# Patient Record
Sex: Male | Born: 1955 | Race: White | Hispanic: No | Marital: Single | Smoking: Former smoker
Health system: Southern US, Community
[De-identification: ages and names within clinical notes are randomized; demographics above are authoritative.]

## PROBLEM LIST (undated history)

## (undated) DIAGNOSIS — F329 Major depressive disorder, single episode, unspecified: Secondary | ICD-10-CM

## (undated) DIAGNOSIS — F32A Depression, unspecified: Secondary | ICD-10-CM

## (undated) DIAGNOSIS — F341 Dysthymic disorder: Secondary | ICD-10-CM

## (undated) DIAGNOSIS — Q8740 Marfan's syndrome, unspecified: Secondary | ICD-10-CM

## (undated) DIAGNOSIS — Q72819 Congenital shortening of unspecified lower limb: Secondary | ICD-10-CM

## (undated) DIAGNOSIS — M5416 Radiculopathy, lumbar region: Secondary | ICD-10-CM

## (undated) DIAGNOSIS — F419 Anxiety disorder, unspecified: Secondary | ICD-10-CM

## (undated) HISTORY — DX: Marfan syndrome, unspecified: Q87.40

## (undated) HISTORY — DX: Congenital shortening of unspecified lower limb: Q72.819

## (undated) HISTORY — DX: Anxiety disorder, unspecified: F41.9

## (undated) HISTORY — DX: Depression, unspecified: F32.A

## (undated) HISTORY — DX: Major depressive disorder, single episode, unspecified: F32.9

## (undated) HISTORY — DX: Radiculopathy, lumbar region: M54.16

## (undated) HISTORY — PX: LEG SURGERY: SHX1003

## (undated) HISTORY — DX: Dysthymic disorder: F34.1

---

## 1998-02-15 ENCOUNTER — Emergency Department (HOSPITAL_COMMUNITY): Admission: EM | Admit: 1998-02-15 | Discharge: 1998-02-15 | Payer: Self-pay | Admitting: Emergency Medicine

## 2006-04-24 DIAGNOSIS — M5416 Radiculopathy, lumbar region: Secondary | ICD-10-CM

## 2006-04-24 HISTORY — DX: Radiculopathy, lumbar region: M54.16

## 2012-06-24 DIAGNOSIS — E785 Hyperlipidemia, unspecified: Secondary | ICD-10-CM | POA: Insufficient documentation

## 2012-07-04 ENCOUNTER — Ambulatory Visit (INDEPENDENT_AMBULATORY_CARE_PROVIDER_SITE_OTHER): Payer: Medicare Other | Admitting: Family Medicine

## 2012-07-04 VITALS — BP 142/90 | HR 117 | Temp 98.3°F | Resp 18 | Ht 74.5 in | Wt 211.4 lb

## 2012-07-04 DIAGNOSIS — R000 Tachycardia, unspecified: Secondary | ICD-10-CM | POA: Diagnosis not present

## 2012-07-04 DIAGNOSIS — F17200 Nicotine dependence, unspecified, uncomplicated: Secondary | ICD-10-CM | POA: Diagnosis not present

## 2012-07-04 DIAGNOSIS — R339 Retention of urine, unspecified: Secondary | ICD-10-CM

## 2012-07-04 DIAGNOSIS — E663 Overweight: Secondary | ICD-10-CM

## 2012-07-04 DIAGNOSIS — F339 Major depressive disorder, recurrent, unspecified: Secondary | ICD-10-CM | POA: Insufficient documentation

## 2012-07-04 DIAGNOSIS — R030 Elevated blood-pressure reading, without diagnosis of hypertension: Secondary | ICD-10-CM

## 2012-07-04 DIAGNOSIS — F411 Generalized anxiety disorder: Secondary | ICD-10-CM

## 2012-07-04 DIAGNOSIS — F329 Major depressive disorder, single episode, unspecified: Secondary | ICD-10-CM

## 2012-07-04 LAB — POCT CBC
HCT, POC: 55.8 % — AB (ref 43.5–53.7)
Hemoglobin: 18.4 g/dL — AB (ref 14.1–18.1)
Lymph, poc: 2.4 (ref 0.6–3.4)
MCH, POC: 31.7 pg — AB (ref 27–31.2)
MCHC: 33 g/dL (ref 31.8–35.4)
MCV: 96.1 fL (ref 80–97)
MPV: 8.8 fL (ref 0–99.8)
POC MID %: 7.7 %M (ref 0–12)
WBC: 7.5 10*3/uL (ref 4.6–10.2)

## 2012-07-04 LAB — COMPREHENSIVE METABOLIC PANEL
Albumin: 5.1 g/dL (ref 3.5–5.2)
BUN: 23 mg/dL (ref 6–23)
CO2: 27 mEq/L (ref 19–32)
Calcium: 10 mg/dL (ref 8.4–10.5)
Chloride: 102 mEq/L (ref 96–112)
Creat: 0.95 mg/dL (ref 0.50–1.35)
Glucose, Bld: 101 mg/dL — ABNORMAL HIGH (ref 70–99)
Potassium: 4.6 mEq/L (ref 3.5–5.3)

## 2012-07-04 LAB — TSH: TSH: 2.407 u[IU]/mL (ref 0.350–4.500)

## 2012-07-04 LAB — LIPID PANEL
LDL Cholesterol: 164 mg/dL — ABNORMAL HIGH (ref 0–99)
Total CHOL/HDL Ratio: 4.7 Ratio

## 2012-07-04 MED ORDER — CLOMIPRAMINE HCL 50 MG PO CAPS: 100.0000 mg | ORAL_CAPSULE | Freq: Every day | ORAL | Status: DC

## 2012-07-04 NOTE — Patient Instructions (Signed)
Check your pulse - it should be less than 50 beats in 30 seconds (or less than 25 beats in 15 seconds.) If it is higher, try deep breathing, relaxation, meditation, and drink a lot of water.  Drinking to much caffiene or alcohol the night before can also elevated your pulse and blood pressure.

## 2012-07-04 NOTE — Progress Notes (Signed)
  Subjective:    Patient ID: Christopher Dawson, male    DOB: 03-21-1956, 57 y.o.   MRN: [id number] Chief Complaint  Patient presents with  . Medication Refill   HPI  Had been being seen at [facility] but had to cancel appts when he didn't have insurance as couldn't afford it and then he couldn't get back in.  Wants to establish elsewhere.  Sees Dr. 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  Depression and anxiety.  Just in the last several days, he got into the pt assistance program for anafranil.  Has been on for 22 yrs and has been excellent for him. Had been on ativan 1.5mg  qd for 30 yrs for anxiety but was able to get off of it and has been doing better.  Was prev seen at [facility] but since has been coming here.  Has been drinking a lot of beer.    Has not had labs done in a long time.  Past Medical History  Diagnosis Date  . Depression   . Anxiety   . Lumbar radiculopathy, chronic 2008   No current outpatient prescriptions on file prior to visit.   No current facility-administered medications on file prior to visit.   Allergies  Allergen Reactions  . Codeine      Review of Systems    BP 142/90  Pulse 117  Temp(Src) 98.3 F (36.8 C) (Oral)  Resp 18  Ht 6' 2.5" (1.892 m)  Wt 211 lb 6.4 oz (95.89 kg)  BMI 26.79 kg/m2  SpO2 97% Objective:   Physical Exam        Assessment & Plan:  Tobacco use disorder - Plan: PSA  Anxiety state, unspecified - Plan: Ambulatory referral to Psychiatry  Depression - Plan: Ambulatory referral to Psychiatry  Elevated blood pressure - Plan: Lipid panel, PSA, Comprehensive metabolic panel  Tachycardia - Plan: POCT CBC, PSA, TSH  Overweight  - Plan: Lipid panel  Incomplete bladder emptying  - Plan: PSA  Meds ordered this encounter  Medications  . DISCONTD: ClomiPRAMINE HCl (ANAFRANIL PO)    Sig: Take 100 mg by mouth.  . DISCONTD:  clomiPRAMINE (ANAFRANIL) 50 MG capsule    Sig: Take 2 capsules (100 mg total) by mouth at bedtime.    Dispense:  30 capsule    Refill:  2

## 2012-08-22 ENCOUNTER — Other Ambulatory Visit: Payer: Self-pay | Admitting: Family Medicine

## 2012-08-23 ENCOUNTER — Telehealth: Payer: Self-pay

## 2012-08-23 NOTE — Telephone Encounter (Signed)
PT WOULD LIKE TO KNOW WHY HIS CLOMIPRAMINE WAS DENIED FOR A REFILL. ALSO WANTS A REFERRAL TO A DIFFERENT PSYCHOLOGIST OTHER THAN [facility]. [facility]# [phone]

## 2012-08-23 NOTE — Telephone Encounter (Signed)
Because he should have a refill left. Called him, he does not have refill left, he takes 2 at bedtime. Pended with qs for 1 month, please advise, also he is advised he does not need referral for psychology. He is encouraged to pick someone and self schedule this appt.

## 2012-08-23 NOTE — Telephone Encounter (Signed)
Perfect, thank you

## 2012-08-24 NOTE — Telephone Encounter (Signed)
Pt states that he still has questions regarding his rx refill on anafranil, pt states that it has not been called in to his pharmacy yet. [facility]# [phone]

## 2012-08-26 ENCOUNTER — Other Ambulatory Visit: Payer: Self-pay | Admitting: Radiology

## 2012-08-26 MED ORDER — CLOMIPRAMINE HCL 50 MG PO CAPS: 100.0000 mg | ORAL_CAPSULE | Freq: Every day | ORAL | Status: DC

## 2012-08-26 NOTE — Telephone Encounter (Signed)
Patient called again, his Rx was not rec'd at the pharmacy it is resent.

## 2012-12-01 ENCOUNTER — Other Ambulatory Visit: Payer: Self-pay | Admitting: Family Medicine

## 2013-02-08 ENCOUNTER — Other Ambulatory Visit: Payer: Self-pay | Admitting: Family Medicine

## 2013-03-06 ENCOUNTER — Encounter: Payer: Self-pay | Admitting: Family Medicine

## 2013-03-13 ENCOUNTER — Other Ambulatory Visit: Payer: Self-pay | Admitting: Family Medicine

## 2013-03-17 ENCOUNTER — Other Ambulatory Visit: Payer: Self-pay | Admitting: Family Medicine

## 2013-03-18 NOTE — Telephone Encounter (Signed)
Needs OV, 3rd notice

## 2013-04-01 ENCOUNTER — Telehealth: Payer: Self-pay

## 2013-04-01 MED ORDER — CLOMIPRAMINE HCL 50 MG PO CAPS: 100.0000 mg | ORAL_CAPSULE | Freq: Every day | ORAL | Status: DC

## 2013-04-01 NOTE — Telephone Encounter (Signed)
SHAW - PT NEEDS A REFILL ON HIS ANAFRANIL.  HE HAS SCHEDULED AN APPOINTMENT WITH YOU FOR DEC. 19, BUT WILL BE OUT OF THE MEDICINE BEFORE THEN.  HIS CELL PHONE IS NOT WORKING AND HE HAS BEEN USING PAY PHONES.  SAYS HE WILL HAVE PHARMACY SEND REQUEST FOR THE REFILL ALSO AND THAT HE WILL CHECK BACK WITH [place] OR THEM IN THE NEXT DAY OR TWO TO SEE IF WE REFILLED SCRIPT.

## 2013-04-01 NOTE — Telephone Encounter (Signed)
Refilled since pt has appt sched on 12/19

## 2013-04-11 ENCOUNTER — Ambulatory Visit (INDEPENDENT_AMBULATORY_CARE_PROVIDER_SITE_OTHER): Payer: Medicare Other | Admitting: Family Medicine

## 2013-04-11 ENCOUNTER — Encounter: Payer: Self-pay | Admitting: Family Medicine

## 2013-04-11 VITALS — BP 150/90 | HR 117 | Temp 98.1°F | Resp 18 | Ht 74.75 in | Wt 206.2 lb

## 2013-04-11 DIAGNOSIS — I10 Essential (primary) hypertension: Secondary | ICD-10-CM

## 2013-04-11 DIAGNOSIS — F411 Generalized anxiety disorder: Secondary | ICD-10-CM

## 2013-04-11 DIAGNOSIS — D751 Secondary polycythemia: Secondary | ICD-10-CM

## 2013-04-11 DIAGNOSIS — F17200 Nicotine dependence, unspecified, uncomplicated: Secondary | ICD-10-CM

## 2013-04-11 DIAGNOSIS — F341 Dysthymic disorder: Secondary | ICD-10-CM

## 2013-04-11 DIAGNOSIS — F329 Major depressive disorder, single episode, unspecified: Secondary | ICD-10-CM

## 2013-04-11 DIAGNOSIS — R269 Unspecified abnormalities of gait and mobility: Secondary | ICD-10-CM

## 2013-04-11 DIAGNOSIS — E785 Hyperlipidemia, unspecified: Secondary | ICD-10-CM

## 2013-04-11 DIAGNOSIS — Q8740 Marfan's syndrome, unspecified: Secondary | ICD-10-CM

## 2013-04-11 MED ORDER — CLOMIPRAMINE HCL 50 MG PO CAPS: 100.0000 mg | ORAL_CAPSULE | Freq: Every day | ORAL | Status: DC

## 2013-04-11 NOTE — Progress Notes (Signed)
Subjective:    Patient ID: Christopher Dawson, male    DOB: [birth year], 57 y.o.   MRN: [id number] Chief Complaint  Patient presents with  . Medication Refill  . Anxiety    would like to go back on ativan   HPI  Was on ativan 1.5mg  qd for 32 yrs and went off of it about a yr ago. At the last visit I wrote that he reported feeling better off of it but he is very sure that this was NOT correct. 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  States the depressive neurosis presents sometimes as "numbness" which the ativan helps with - when asked if this was physical or mental numbness he replied "physical."  He was prev seen at [facility] by Dr. Edmund Hilda who retired then next psychiatrist there also retired so then was seen by NP Tamela Oddi at [facility] once but something happened - he thinks his visit was billed wrong - so he still owes them $$ so can't (and doesn't want to) go back.  Would be open to establishing elsewhere - just wasn't sure where to look. Still on pt assistance program for the anafranil - Has been on for 22 yrs and has been excellent for him.   Was shocked that his last LDL was elevated. Eats no cholesterol. No margarine or butter. No oil of any type other than olive oil. Usually eats raw spinach, baby lettuce, cottage cheese for breakfast.  Does eat very lean hamburger about twice a week, generally chicken, no cheese, no eggs.  No desserts. Does all of his own cooking. No sig fam hx of heart disease or HPL that he is aware of so doesn't know how he could poss have an elev chol.  Has straight medicare, no supplemental, so got a bill of > $100 after his last visit w/ me for visit copay and labs.  On disability so still hasn't paid it off completely so really needs to defer any labs or extra things today - just wants his psych meds refiled.  Doesn't have a car right now so has to take a taxi everywhere - very  expensive  On disability for lumbar disc prob in back since 2008 and one congenitally shortened leg as well as Marfan's syndrome.  Past Medical History  Diagnosis Date  . Depression   . Anxiety   . Lumbar radiculopathy, chronic 2008   Current Outpatient Prescriptions on File Prior to Visit  Medication Sig Dispense Refill  . clomiPRAMINE (ANAFRANIL) 50 MG capsule Take 2 capsules (100 mg total) by mouth at bedtime. PATIENT NEEDS OFFICE VISIT FOR ADDITIONAL REFILLS - 2nd NOTICE  30 capsule  0  . clomiPRAMINE (ANAFRANIL) 50 MG capsule Take 2 capsules (100 mg total) by mouth at bedtime. NEED VISIT!!4th notice  30 capsule  0   No current facility-administered medications on file prior to visit.   Allergies  Allergen Reactions  . Codeine      Review of Systems  Constitutional: Negative for diaphoresis, activity change, appetite change and unexpected weight change.  Respiratory: Negative for chest tightness and shortness of breath.   Cardiovascular: Negative for chest pain and palpitations.  Gastrointestinal: Negative for nausea and vomiting.  Musculoskeletal: Positive for arthralgias and gait problem.  Neurological: Positive for numbness. Negative for dizziness, syncope and light-headedness.  Psychiatric/Behavioral: Positive for dysphoric mood. Negative for suicidal ideas, behavioral problems, confusion, self-injury and agitation.  The patient is nervous/anxious. The patient is not hyperactive.       BP 170/98  Pulse 117  Temp(Src) 98.1 F (36.7 C) (Oral)  Resp 18  Ht 6' 2.75" (1.899 m)  Wt 206 lb 3.2 oz (93.532 kg)  BMI 25.94 kg/m2  SpO2 98% Objective:   Physical Exam  Constitutional: He is oriented to person, place, and time. He appears well-developed and well-nourished. No distress.  HENT:  Head: Normocephalic and atraumatic.  Eyes: Conjunctivae are normal. No scleral icterus.  Neck: Normal range of motion. Neck supple. No thyromegaly present.  Cardiovascular: Normal rate,  regular rhythm, normal heart sounds and intact distal pulses.   Pulmonary/Chest: Effort normal and breath sounds normal. No respiratory distress.  Lymphadenopathy:    He has no cervical adenopathy.  Neurological: He is alert and oriented to person, place, and time. Gait abnormal.  Skin: Skin is warm and dry. He is not diaphoretic.  Psychiatric: He has a normal mood and affect. His speech is normal and behavior is normal. He exhibits normal recent memory and normal remote memory.      Assessment & Plan:  Tobacco use disorder - states he doesn't inhale and planning to switch to e-cig/vapor. Contemplative about quitting.  Depression - Pt's mood d/o has been stable on anafranil for many yrs so refilled but rec cmp and ekg occ for monitoring. Declined today due to cost but will do at f/u.  Anxiety state, unspecified - I am unwilling to rx controlled med to pt as he cannot afford to f/u every 3 months and pt has a lot of risk factors for non-compliance esp w/ OVs. Also pt revealed at last visit that he is consuming " a lot" of EtOH so unwilling to add on another potential cns depressent. Pt reminded that due to his complex mental health hx if he wants to change his meds at all from the below anafranil or restart controlled substance he will have to see a psychiatrist for this - pt very agreeable and understanding. Contact info for sev excellent providers given.  Essential hypertension, benign - improved some on recheck. Suspect white coat HTN due to mood sxs so will try to check outside the office.  If pt does have Marfan, recommended to be on BB (may benefit from propranolol due to anxiety hx?) and cons losartan as well to reduce rate of aortic root dilatation.  Need prior records - have pt sign release at f/u.  Other and unspecified hyperlipidemia - Pt very concerned due to highly rigorous low chol diet - discussed likely genetic component but need to lower due to mult other risk factors of age, white,  male, smoking, HTN. Declines med now due to lab work connected but would like to recheck flp at f/u and cons med if needed then.  If pt does have Marfan, should likely be on statin anyway to help prevent remodling.  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 Rec recheck at f/u.  HM - pt recommended to make appt for medicare wellness physical at next OV in 6 mos - can get EKG and labs at that time poss at less cost to pt.  ? H/o marfan syndrome - Pt has apparently transferred his care here - needs to sign release of records at f/u so can get hx from [facility] to confirm diagnosis of Marfan as then pt would need annual cardiology and optho eval.  Meds ordered this encounter  Medications  . clomiPRAMINE (ANAFRANIL) 50 MG  capsule    Sig: Take 2 capsules (100 mg total) by mouth at bedtime.    Dispense:  180 capsule    Refill:  2    Norberto Sorenson, MD MPH

## 2013-04-11 NOTE — Patient Instructions (Addendum)
[facility]  [address], [place], [place] [address]  [phone]  [facility] - Dr. Allena Katz Address: [address], [place], [place] [address]  [phone]  [facility] [address] [place], [id number] Phone: [phone]  Hypercholesterolemia High Blood Cholesterol Cholesterol is a white, waxy, fat-like protein needed by your body in small amounts. The liver makes all the cholesterol you need. It is carried from the liver by the blood through the blood vessels. Deposits (plaque) may build up on blood vessel walls. This makes the arteries narrower and stiffer. Plaque increases the risk for heart attack and stroke. You cannot feel your cholesterol level even if it is very high. The only way to know is by a blood test to check your lipid (fats) levels. Once you know your cholesterol levels, you should keep a record of the test results. Work with your caregiver to to keep your levels in the desired range. WHAT THE RESULTS MEAN:  Total cholesterol is a rough measure of all the cholesterol in your blood.  LDL is the so-called bad cholesterol. This is the type that deposits cholesterol in the walls of the arteries. You want this level to be low.  HDL is the good cholesterol because it cleans the arteries and carries the LDL away. You want this level to be high.  Triglycerides are fat that the body can either burn for energy or store. High levels are closely linked to heart disease. DESIRED LEVELS:  Total cholesterol below 200.  LDL below 100 for people at risk, below 70 for very high risk.  HDL above 50 is good, above 60 is best.  Triglycerides below 150. HOW TO LOWER YOUR CHOLESTEROL:  Diet.  Choose fish or white meat chicken and [place], roasted or baked. Limit fatty cuts of red meat, fried foods, and processed meats, such as sausage and lunch meat.  Eat lots of fresh fruits and  vegetables. Choose whole grains, beans, pasta, potatoes and cereals.  Use only small amounts of olive, corn or canola oils. Avoid butter, mayonnaise, shortening or palm kernel oils. Avoid foods with trans-fats.  Use skim/nonfat milk and low-fat/nonfat yogurt and cheeses. Avoid whole milk, cream, ice cream, egg yolks and cheeses. Healthy desserts include angel food cake, gingersnaps, animal crackers, hard candy, popsicles, and low-fat/nonfat frozen yogurt. Avoid pastries, cakes, pies and cookies.  Exercise.  A regular program helps decrease LDL and raises HDL.  Helps with weight control.  Do things that increase your activity level like gardening, walking, or taking the stairs.  Medication.  May be prescribed by your caregiver to help lowering cholesterol and the risk for heart disease.  You may need medicine even if your levels are normal if you have several risk factors. HOME CARE INSTRUCTIONS   Follow your diet and exercise programs as suggested by your caregiver.  Take medications as directed.  Have blood work done when your caregiver feels it is necessary. MAKE SURE YOU:   Understand these instructions.  Will watch your condition.  Will get help right away if you are not doing well or get worse. Document Released: 04/10/2005 Document Revised: 07/03/2011 Document Reviewed: 09/26/2006 [facility] Patient Information 2014 [place], [place]. Managing Your High Blood Pressure Blood pressure is a measurement of how forceful your blood is pressing against the walls of the arteries. Arteries are muscular tubes within the circulatory system. Blood pressure does not stay the same. Blood pressure rises when you are active, excited, or  nervous; and it lowers during sleep and relaxation. If the numbers measuring your blood pressure stay above normal most of the time, you are at risk for health problems. High blood pressure (hypertension) is a long-term (chronic) condition in which blood  pressure is elevated. A blood pressure reading is recorded as two numbers, such as 120 over 80 (or 120/80). The first, higher number is called the systolic pressure. It is a measure of the pressure in your arteries as the heart beats. The second, lower number is called the diastolic pressure. It is a measure of the pressure in your arteries as the heart relaxes between beats.  Keeping your blood pressure in a normal range is important to your overall health and prevention of health problems, such as heart disease and stroke. When your blood pressure is uncontrolled, your heart has to work harder than normal. High blood pressure is a very common condition in adults because blood pressure tends to rise with age. Men and women are equally likely to have hypertension but at different times in life. Before age 107, men are more likely to have hypertension. After 58 years of age, women are more likely to have it. Hypertension is especially common in African Americans. This condition often has no signs or symptoms. The cause of the condition is usually not known. Your caregiver can help you come up with a plan to keep your blood pressure in a normal, healthy range. BLOOD PRESSURE STAGES Blood pressure is classified into four stages: normal, prehypertension, stage 1, and stage 2. Your blood pressure reading will be used to determine what type of treatment, if any, is necessary. Appropriate treatment options are tied to these four stages:  Normal  Systolic pressure (mm Hg): below 120.  Diastolic pressure (mm Hg): below 80. Prehypertension  Systolic pressure (mm Hg): 120 to 139.  Diastolic pressure (mm Hg): 80 to 89. Stage1  Systolic pressure (mm Hg): 140 to 159.  Diastolic pressure (mm Hg): 90 to 99. Stage2  Systolic pressure (mm Hg): 160 or above.  Diastolic pressure (mm Hg): 100 or above. RISKS RELATED TO HIGH BLOOD PRESSURE Managing your blood pressure is an important responsibility.  Uncontrolled high blood pressure can lead to:  A heart attack.  A stroke.  A weakened blood vessel (aneurysm).  Heart failure.  Kidney damage.  Eye damage.  Metabolic syndrome.  Memory and concentration problems. HOW TO MANAGE YOUR BLOOD PRESSURE Blood pressure can be managed effectively with lifestyle changes and medicines (if needed). Your caregiver will help you come up with a plan to bring your blood pressure within a normal range. Your plan should include the following: Education  Read all information provided by your caregivers about how to control blood pressure.  Educate yourself on the latest guidelines and treatment recommendations. New research is always being done to further define the risks and treatments for high blood pressure. Lifestylechanges  Control your weight.  Avoid smoking.  Stay physically active.  Reduce the amount of salt in your diet.  Reduce stress.  Control any chronic conditions, such as high cholesterol or diabetes.  Reduce your alcohol intake. Medicines  Several medicines (antihypertensive medicines) are available, if needed, to bring blood pressure within a normal range. Communication  Review all the medicines you take with your caregiver because there may be side effects or interactions.  Talk with your caregiver about your diet, exercise habits, and other lifestyle factors that may be contributing to high blood pressure.  See your caregiver regularly.  Your caregiver can help you create and adjust your plan for managing high blood pressure. RECOMMENDATIONS FOR TREATMENT AND FOLLOW-UP  The following recommendations are based on current guidelines for managing high blood pressure in nonpregnant adults. Use these recommendations to identify the proper follow-up period or treatment option based on your blood pressure reading. You can discuss these options with your caregiver.  Systolic pressure of 120 to 139 or diastolic pressure of  80 to 89: Follow up with your caregiver as directed.  Systolic pressure of 140 to 160 or diastolic pressure of 90 to 100: Follow up with your caregiver within 2 months.  Systolic pressure above 160 or diastolic pressure above 100: Follow up with your caregiver within 1 month.  Systolic pressure above 180 or diastolic pressure above 110: Consider antihypertensive therapy; follow up with your caregiver within 1 week.  Systolic pressure above 200 or diastolic pressure above 120: Begin antihypertensive therapy; follow up with your caregiver within 1 week. Document Released: 01/03/2012 Document Reviewed: 01/03/2012 [facility] Patient Information 2014 [place], [place]. DASH Diet The DASH diet stands for "Dietary Approaches to Stop Hypertension." It is a healthy eating plan that has been shown to reduce high blood pressure (hypertension) in as little as 14 days, while also possibly providing other significant health benefits. These other health benefits include reducing the risk of breast cancer after menopause and reducing the risk of type 2 diabetes, heart disease, colon cancer, and stroke. Health benefits also include weight loss and slowing kidney failure in patients with chronic kidney disease.  DIET GUIDELINES  Limit salt (sodium). Your diet should contain less than 1500 mg of sodium daily.  Limit refined or processed carbohydrates. Your diet should include mostly whole grains. Desserts and added sugars should be used sparingly.  Include small amounts of heart-healthy fats. These types of fats include nuts, oils, and tub margarine. Limit saturated and trans fats. These fats have been shown to be harmful in the body. CHOOSING FOODS  The following food groups are based on a 2000 calorie diet. See your Registered Dietitian for individual calorie needs. Grains and Grain Products (6 to 8 servings daily)  Eat More Often: Whole-wheat bread, brown rice, whole-grain or wheat pasta, quinoa, popcorn without  added fat or salt (air popped).  Eat Less Often: White bread, white pasta, white rice, cornbread. Vegetables (4 to 5 servings daily)  Eat More Often: Fresh, frozen, and canned vegetables. Vegetables may be raw, steamed, roasted, or grilled with a minimal amount of fat.  Eat Less Often/Avoid: Creamed or fried vegetables. Vegetables in a cheese sauce. Fruit (4 to 5 servings daily)  Eat More Often: All fresh, canned (in natural juice), or frozen fruits. Dried fruits without added sugar. One hundred percent fruit juice ( cup [237 mL] daily).  Eat Less Often: Dried fruits with added sugar. Canned fruit in light or heavy syrup. [employer], Fish, and Poultry (2 servings or less daily. One serving is 3 to 4 oz [85-114 g]).  Eat More Often: Ninety percent or leaner ground beef, tenderloin, sirloin. Round cuts of beef, chicken breast, [place] breast. All fish. Grill, bake, or broil your meat. Nothing should be fried.  Eat Less Often/Avoid: Fatty cuts of meat, [place], or chicken leg, thigh, or wing. Fried cuts of meat or fish. Dairy (2 to 3 servings)  Eat More Often: Low-fat or fat-free milk, low-fat plain or light yogurt, reduced-fat or part-skim cheese.  Eat Less Often/Avoid: Milk (whole, 2%).Whole milk yogurt. Full-fat cheeses. Nuts, Seeds, and  Legumes (4 to 5 servings per week)  Eat More Often: All without added salt.  Eat Less Often/Avoid: Salted nuts and seeds, canned beans with added salt. Fats and Sweets (limited)  Eat More Often: Vegetable oils, tub margarines without trans fats, sugar-free gelatin. Mayonnaise and salad dressings.  Eat Less Often/Avoid: Coconut oils, palm oils, butter, stick margarine, cream, half and half, cookies, candy, pie. FOR MORE INFORMATION The Dash Diet Eating Plan: www.dashdiet.org Document Released: 03/30/2011 Document Revised: 07/03/2011 Document Reviewed: 03/30/2011 [facility] Patient Information 2014 [place], [place]. Direct Low-Density Lipoprotein  Cholesterol This is a test used to help determine your risk of developing heart disease and to monitor lipid-lowering lifestyle changes and drug therapies. To accurately determine your low-density lipoprotein (LDL) level when you are non-fasting. The direct low-density lipoprotein test (DLDL or Direct LDL) provides an actual measurement of LDL cholesterol, the "bad" cholesterol in blood. Ordinarily, when a lipid profile is ordered, LDL cholesterol is calculated using the results of the total cholesterol, the HDL cholesterol (high-density lipoprotein, the "good" cholesterol), and the triglycerides tests. However, calculation cannot be used if triglyceride levels are above 400 mg/dl or if the patient is not fasting. DLDL has now become a useful tool in lipid management because it allows measurement of LDL cholesterol in either of these circumstances. Since the direct measurement of LDL cholesterol is less affected by triglyceride concentrations than the calculation of LDL cholesterol, the DLDL test can be used when the patient is not fastingor when the patient has significantly elevated triglycerides.  Elevated levels of LDL, as measured with the DLDL, indicate a greater risk of developing heart disease. Decreasing levels show a response to lipid-lowering lifestyle changes or drug therapies and indicate a decreased risk of heart disease. NORMAL FINDINGS LDL: 60 to 180 mg/dl or less than 1.61 mmol/L (SI units) Ranges for normal findings may vary among different laboratories and hospitals. You should always check with your doctor after having lab work or other tests done to discuss the meaning of your test results and whether your values are considered within normal limits. MEANING OF TEST  Your caregiver will go over the test results with you and discuss the importance and meaning of your results, as well as treatment options and the need for additional tests if necessary. OBTAINING THE TEST RESULTS It is  your responsibility to obtain your test results. Ask the lab or department performing the test when and how you will get your results. Document Released: 05/04/2004 Document Revised: 07/03/2011 Document Reviewed: 03/20/2008 [employer] Patient Information 2014 [place], [place]. Fat and Cholesterol Control Diet Fat and cholesterol levels in your blood and organs are influenced by your diet. High levels of fat and cholesterol may lead to diseases of the heart, small and large blood vessels, gallbladder, liver, and pancreas. CONTROLLING FAT AND CHOLESTEROL WITH DIET Although exercise and lifestyle factors are important, your diet is key. That is because certain foods are known to raise cholesterol and others to lower it. The goal is to balance foods for their effect on cholesterol and more importantly, to replace saturated and trans fat with other types of fat, such as monounsaturated fat, polyunsaturated fat, and omega-3 fatty acids. On average, a person should consume no more than 15 to 17 g of saturated fat daily. Saturated and trans fats are considered "bad" fats, and they will raise LDL cholesterol. Saturated fats are primarily found in animal products such as meats, butter, and cream. However, that does not mean you need to give up  all your favorite foods. Today, there are good tasting, low-fat, low-cholesterol substitutes for most of the things you like to eat. Choose low-fat or nonfat alternatives. Choose round or loin cuts of red meat. These types of cuts are lowest in fat and cholesterol. Chicken (without the skin), fish, veal, and ground [place] breast are great choices. Eliminate fatty meats, such as hot dogs and salami. Even shellfish have little or no saturated fat. Have a 3 oz (85 g) portion when you eat lean meat, poultry, or fish. Trans fats are also called "partially hydrogenated oils." They are oils that have been scientifically manipulated so that they are solid at room temperature resulting in a  longer shelf life and improved taste and texture of foods in which they are added. Trans fats are found in stick margarine, some tub margarines, cookies, crackers, and baked goods.  When baking and cooking, oils are a great substitute for butter. The monounsaturated oils are especially beneficial since it is believed they lower LDL and raise HDL. The oils you should avoid entirely are saturated tropical oils, such as coconut and palm.  Remember to eat a lot from food groups that are naturally free of saturated and trans fat, including fish, fruit, vegetables, beans, grains (barley, rice, couscous, bulgur wheat), and pasta (without cream sauces).  IDENTIFYING FOODS THAT LOWER FAT AND CHOLESTEROL  Soluble fiber may lower your cholesterol. This type of fiber is found in fruits such as apples, vegetables such as broccoli, potatoes, and carrots, legumes such as beans, peas, and lentils, and grains such as barley. Foods fortified with plant sterols (phytosterol) may also lower cholesterol. You should eat at least 2 g per day of these foods for a cholesterol lowering effect.  Read package labels to identify low-saturated fats, trans fat free, and low-fat foods at the supermarket. Select cheeses that have only 2 to 3 g saturated fat per ounce. Use a heart-healthy tub margarine that is free of trans fats or partially hydrogenated oil. When buying baked goods (cookies, crackers), avoid partially hydrogenated oils. Breads and muffins should be made from whole grains (whole-wheat or whole oat flour, instead of "flour" or "enriched flour"). Buy non-creamy canned soups with reduced salt and no added fats.  FOOD PREPARATION TECHNIQUES  Never deep-fry. If you must fry, either stir-fry, which uses very little fat, or use non-stick cooking sprays. When possible, broil, bake, or roast meats, and steam vegetables. Instead of putting butter or margarine on vegetables, use lemon and herbs, applesauce, and cinnamon (for squash and  sweet potatoes). Use nonfat yogurt, salsa, and low-fat dressings for salads.  LOW-SATURATED FAT / LOW-FAT FOOD SUBSTITUTES Meats / Saturated Fat (g)  Avoid: Steak, marbled (3 oz/85 g) / 11 g  Choose: Steak, lean (3 oz/85 g) / 4 g  Avoid: Hamburger (3 oz/85 g) / 7 g  Choose: Hamburger, lean (3 oz/85 g) / 5 g  Avoid: Ham (3 oz/85 g) / 6 g  Choose: Ham, lean cut (3 oz/85 g) / 2.4 g  Avoid: Chicken, with skin, dark meat (3 oz/85 g) / 4 g  Choose: Chicken, skin removed, dark meat (3 oz/85 g) / 2 g  Avoid: Chicken, with skin, light meat (3 oz/85 g) / 2.5 g  Choose: Chicken, skin removed, light meat (3 oz/85 g) / 1 g Dairy / Saturated Fat (g)  Avoid: Whole milk (1 cup) / 5 g  Choose: Low-fat milk, 2% (1 cup) / 3 g  Choose: Low-fat milk, 1% (1 cup) /  1.5 g  Choose: Skim milk (1 cup) / 0.3 g  Avoid: Hard cheese (1 oz/28 g) / 6 g  Choose: Skim milk cheese (1 oz/28 g) / 2 to 3 g  Avoid: Cottage cheese, 4% fat (1 cup) / 6.5 g  Choose: Low-fat cottage cheese, 1% fat (1 cup) / 1.5 g  Avoid: Ice cream (1 cup) / 9 g  Choose: Sherbet (1 cup) / 2.5 g  Choose: Nonfat frozen yogurt (1 cup) / 0.3 g  Choose: Frozen fruit bar / trace  Avoid: Whipped cream (1 tbs) / 3.5 g  Choose: Nondairy whipped topping (1 tbs) / 1 g Condiments / Saturated Fat (g)  Avoid: Mayonnaise (1 tbs) / 2 g  Choose: Low-fat mayonnaise (1 tbs) / 1 g  Avoid: Butter (1 tbs) / 7 g  Choose: Extra light margarine (1 tbs) / 1 g  Avoid: Coconut oil (1 tbs) / 11.8 g  Choose: Olive oil (1 tbs) / 1.8 g  Choose: Corn oil (1 tbs) / 1.7 g  Choose: Safflower oil (1 tbs) / 1.2 g  Choose: Sunflower oil (1 tbs) / 1.4 g  Choose: Soybean oil (1 tbs) / 2.4 g  Choose: Canola oil (1 tbs) / 1 g Document Released: 04/10/2005 Document Revised: 08/05/2012 Document Reviewed: 09/29/2010 [employer] Patient Information 2014 [place], [place].

## 2013-04-12 ENCOUNTER — Encounter: Payer: Self-pay | Admitting: Family Medicine

## 2013-04-12 DIAGNOSIS — R269 Unspecified abnormalities of gait and mobility: Secondary | ICD-10-CM | POA: Insufficient documentation

## 2013-04-12 DIAGNOSIS — Q8740 Marfan's syndrome, unspecified: Secondary | ICD-10-CM | POA: Insufficient documentation

## 2013-11-02 ENCOUNTER — Other Ambulatory Visit: Payer: Self-pay | Admitting: Family Medicine

## 2013-12-05 ENCOUNTER — Encounter: Payer: Medicare Other | Admitting: Family Medicine

## 2014-02-17 ENCOUNTER — Other Ambulatory Visit: Payer: Self-pay | Admitting: Family Medicine

## 2014-03-03 ENCOUNTER — Other Ambulatory Visit: Payer: Self-pay | Admitting: Family Medicine

## 2014-03-04 ENCOUNTER — Other Ambulatory Visit: Payer: Self-pay | Admitting: Family Medicine

## 2014-03-05 NOTE — Telephone Encounter (Signed)
Advised pt on VM that RFs were sent but that he needs to keep appt in Jan for check up

## 2014-03-05 NOTE — Telephone Encounter (Signed)
Patient called to check on status of refill request from yesterday.   [phone]

## 2014-03-05 NOTE — Telephone Encounter (Signed)
Dr Brigitte Pulse, we had refused refill w/note that RFs were sent in July, but now they are sending back req that they didn't receive that Rx. I wanted to ask you if you want to RF until pt's appt in Jan, bc pt hasn't been seen since last Dec. I pended for the 90 days, but can just do 30 of less if you want him to come in sooner.

## 2014-05-08 ENCOUNTER — Ambulatory Visit: Payer: Medicare Other | Admitting: Family Medicine

## 2014-05-09 DIAGNOSIS — D696 Thrombocytopenia, unspecified: Secondary | ICD-10-CM | POA: Insufficient documentation

## 2014-06-05 ENCOUNTER — Encounter: Payer: Self-pay | Admitting: Family Medicine

## 2014-06-05 ENCOUNTER — Ambulatory Visit (INDEPENDENT_AMBULATORY_CARE_PROVIDER_SITE_OTHER): Payer: Medicare Other | Admitting: Family Medicine

## 2014-06-05 VITALS — BP 130/86 | HR 98 | Temp 98.1°F | Resp 16 | Ht 75.5 in | Wt 194.0 lb

## 2014-06-05 DIAGNOSIS — F32A Depression, unspecified: Secondary | ICD-10-CM

## 2014-06-05 DIAGNOSIS — F341 Dysthymic disorder: Secondary | ICD-10-CM

## 2014-06-05 DIAGNOSIS — F329 Major depressive disorder, single episode, unspecified: Secondary | ICD-10-CM | POA: Diagnosis not present

## 2014-06-05 DIAGNOSIS — Q8740 Marfan's syndrome, unspecified: Secondary | ICD-10-CM | POA: Diagnosis not present

## 2014-06-05 DIAGNOSIS — Z720 Tobacco use: Secondary | ICD-10-CM | POA: Diagnosis not present

## 2014-06-05 DIAGNOSIS — F17200 Nicotine dependence, unspecified, uncomplicated: Secondary | ICD-10-CM

## 2014-06-05 DIAGNOSIS — F411 Generalized anxiety disorder: Secondary | ICD-10-CM

## 2014-06-05 DIAGNOSIS — R269 Unspecified abnormalities of gait and mobility: Secondary | ICD-10-CM

## 2014-06-05 MED ORDER — ANAFRANIL 50 MG PO CAPS: 100.0000 mg | ORAL_CAPSULE | Freq: Every day | ORAL | Status: DC

## 2014-06-05 NOTE — Progress Notes (Signed)
Subjective:    Patient ID: Christopher Dawson, male    DOB: 1955-05-09, 59 y.o.   MRN: [id number] This chart was scribed for Christopher Cheadle, MD by Marti Sleigh, Medical Scribe. This patient was seen in Room 25 and the patient's care was started a 2:42 PM.  Chief Complaint  Patient presents with  . Medication Refill    HPI Past Medical History  Diagnosis Date  . Depression   . Anxiety   . Lumbar radiculopathy, chronic 2008  . Marfan syndrome   . Shortening, leg, congenital   . Neurosis, depressive    Allergies  Allergen Reactions  . Codeine    Current Outpatient Prescriptions on File Prior to Visit  Medication Sig Dispense Refill  . ANAFRANIL 50 MG capsule TAKE 2 CAPSULE BY MOUTH AT BEDTIME. 180 capsule 1  . ANAFRANIL 50 MG capsule TAKE 2 CAPSULES (100 MG TOTAL) BY MOUTH AT BEDTIME. NEED VISIT!!4TH NOTICE 180 capsule 0   No current facility-administered medications on file prior to visit.    HPI Comments: Christopher Dawson is a 59 y.o. male who presents to [facility] reporting for a medication refill. Pt states he has been fine for the last year. Pt states he does not check his BP outside of the office. Pt states he did not taken his Anafranil for 8-9 days because he did not have the money for the co-pay, but states he was able to pick it up last night and feels better today. Pt states he has had heart ultrasound 20 years ago to check his Marfan's syndrome, which was normal. Pt states he still smokes, but does not inhale.   Notes from Previous HPIs: Pt [facility], but his psychiatrist retired and after that had several falling outs with other providers at the practice. Pt states he suffers from depressive nerosis. Pt has been on Anafranil at night for over 25 years. Pt eats a very low cholesterol and low fat diet. No family hx of cardiac or cholesterol issues. Pt is on disability for lumbar back pain, leg length discrepancy, and  Marfan's syndrome. Pt does have white coat htn, and is encouraged to check BP outside the office. Pt has not had appropriate follow up for his Marfan's syndrome, with no recent EKG in chart, and is not taking BP medication. Pt has previously had his primary care through [facility]. Pt is a smoker.    Review of Systems  Constitutional: Negative for fever, chills, activity change and appetite change.  Respiratory: Negative for shortness of breath.   Cardiovascular: Negative for chest pain.  Neurological: Negative for syncope.       Objective:   Physical Exam  Constitutional: He is oriented to person, place, and time. He appears well-developed and well-nourished.  HENT:  Head: Normocephalic and atraumatic.  Eyes: Pupils are equal, round, and reactive to light.  Neck: Neck supple. No thyromegaly present.  Cardiovascular: Normal rate, regular rhythm and normal heart sounds.  Exam reveals no gallop and no friction rub.   No murmur heard. Pulmonary/Chest: Effort normal and breath sounds normal. No respiratory distress. He has no wheezes.  Lymphadenopathy:    He has no cervical adenopathy.  Neurological: He is alert and oriented to person, place, and time.  Skin: Skin is warm and dry.  Psychiatric: He has a normal mood and affect. His behavior is normal.  Nursing note and vitals reviewed.  BP 130/86 mmHg  Pulse 98  Temp(Src) 98.1 F (36.7 C)  Resp 16  Ht 6' 3.5" (1.918 m)  Wt 194 lb (87.998 kg)  BMI 23.92 kg/m2  SpO2 98%     Assessment & Plan:  Anxiety state  Depression  Marfan syndrome - pt reports a h/o this - previously followed by [facility] - but we don't have any of his prior records confirming this. rec to pt that he needs cardiology eval - should likely be on bb and acei/arb with echo to eval for aortic root dilatation as well as yearly optho exam - pt just has medicare - no medicaid despite long-term medically disability - but so refuses any further referral or any  blood, ekg, or any other ancillary evaluation at this time. Reviewed risks of aortic aneurysm with high risk of being asymptomatic before acute arrest/hemorrhage/death esp as added risk of tob use  Abnormality of gait  Neurosis, depressive - stable on anafranil for many years started by now retired Forensic scientist from [facility] - pt cannot afford specialist care or therapy - has to use his limited income to get to pharmacy by taxi for med refills - anafranil would cost pt up to $3000/mo but fortunately he has gotten on the pharmaceutical pt assistance program through [employer] - recommend to pt that he contact co to see if meds could be shipped to his house directly - ok to call in for rx to send directly to co if needed.  Recheck in 1 yr for additional refills (any more freq visits are cost-prohibitive for pt).  Pt needs to be encouraged to sched his medicare wellness visit at f/u but pt concerned as labs last done 2 yrs ago gave him a huge bill.)  Tobacco use disorder - not inhaling and only 2-3 cig/d - pt does not have any interest in cutting down further at this time   Meds ordered this encounter  Medications  . ANAFRANIL 50 MG capsule    Sig: Take 2 capsules (100 mg total) by mouth at bedtime.    Dispense:  180 capsule    Refill:  3    I personally performed the services described in this documentation, which was scribed in my presence. The recorded information has been reviewed and considered, and addended by me as needed.  Christopher Cheadle, MD MPH

## 2014-06-05 NOTE — Patient Instructions (Addendum)
Please call the [facility] [phone] to see if their is anyway they could ship this to your house.  If they can but need me to send them a new prescription that is fine.   With Marfan's syndrome, you should have a regular cardiac tests to ensure you are not developing complications.  Please check with your insurance to see if any of these things are covered.  A cardiology visit at least anually - we usually refer to [facility] An Echocardiogram An EKG A yearly opthalmology visit.  Most pt's with marfan's are usually started on blood pressure medications to avoid changes in the heart and aorta of a type of blood pressure medications called beta-blockers and ace-inhibitors/arbs.   Marfan Syndrome Marfan syndrome is a connective tissue disorder caused by a gene mutation (change). Connective tissue is found throughout the body and helps support tissues and body organs. This means Marfan syndrome can affect nearly all organ systems in your body. It can affect people of all races and ages. Marfan syndrome can be inherited (passed on) from a parent who has it.  SYMPTOMS  Serious heart problems are associated with Marfan syndrome:  A leaky mitral valve (a heart valve that is located between the top and bottom parts of the heart) caused by a mitral valve prolapse (not closing all the way). A mitral valve leak can cause shortness of breath, dizziness, fatigue, or abnormal heartbeats. Medication or surgery may be needed to help with these symptoms.  A weak aorta (the large artery carrying blood away from the heart to the rest of the body) can also cause problems. If the aorta enlarges greatly or tears, it can cause death. Warning signs of aortic enlargement may range from no symptoms to having pain or pressure in the chest or back. If aortic enlargement is discovered, surgery is usually necessary.  Many people with Marfan syndrome are nearsighted or have  other eye problems. Some of these include:  Dislocated (out of place) eye lenses.  Detached retina.  Glaucoma.  Cataracts.  Bone and joint (skeletal system) problems.  Long arms and legs.  Long, thin fingers.  Curvature of the spine (scoliosis).  Chest is concave (inward forming) or sticks out (pigeon chest). DIAGNOSIS  Diagnosis (learning what is wrong) of Marfan syndrome can be made by:  Doing a complete physical exam and taking a complete family history.  CT or MRI imaging to assess aortic size. The aorta should be followed and monitored closely to watch for enlargement.  Echocardiogram. A sound wave imaging picture that looks at the heart, its valves, and the aorta.  Electrocardiogram (ECG). This test checks your heart rate and rhythm.  Slit lamp examination. This is an eye test that can check to see if your lenses are out of place. TREATMENT   There is no cure for Marfan syndrome. Medical problems associated with Marfan syndrome are followed and treated by your caregiver as needed.  It is important to follow up with your caregiver on a regular basis after the diagnosis of Marfan syndrome is made. Your caregiver may use the same tests that he or she used to diagnose the syndrome as part of your follow-up care. Heart conditions associated with Marfan syndrome will need to be watched closely.  Heart medicine called beta-blockers may be used to reduce blood pressure. These help lower the stress on the heart and aorta. Beta-blockers can also control irregular heartbeats.  It may be necessary to take prophylactic (preventative) antibiotics (  medications that kill bacteria) if dental or genitourinary procedures are performed in people with mitral valve prolapse or those who have artificial heart valves.  Avoidance of contact sports and strenuous exercise may be necessary to lessen the strain on the aorta. Your caregiver can discuss your activity limitations. If you have Marfan  syndrome, check with your caregiver if you are unsure which activities may be at risk for you. MAKE SURE YOU:   Understand these discharge instructions.  Will monitor your condition.  Seek immediate medical care if necessary. Document Released: 07/17/2000 Document Revised: 08/25/2013 Document Reviewed: 01/28/2008 [facility] Patient Information 2015 [place], [place]. This information is not intended to replace advice given to you by your health care provider. Make sure you discuss any questions you have with your health care provider.

## 2014-08-21 ENCOUNTER — Encounter: Payer: Self-pay | Admitting: Family Medicine

## 2014-09-05 ENCOUNTER — Telehealth: Payer: Self-pay

## 2014-09-05 NOTE — Telephone Encounter (Signed)
[facility]--called in stating that he would like to talk to someone about his Ativan rx because he feels like he needs to be put back on it, because he isnt feeling right mentally. He would like to have someone call him back to talk about his ASAP. He is currently taking ANAFRANIL 50 MG capsule but he states that the Ativan really helps him when he gets into a high anxiety rate.  His call back number is [phone] .  Marking high priority due to his state of mind on the phone while I was talking to him, seemed really agitated and nervous.

## 2014-09-05 NOTE — Telephone Encounter (Signed)
I am very reluctant to place patient onto any controlled substance because as a family physician I really need to have him come in for regular visits and monitoring when prescribing a medication that is closely regulated.  Because I have had the pleasure of getting to know Christopher Dawson over the past several years I will make a limited exception and prescribe him a very few ativan for him to use as needed for severe anxiety situations but if he wants to stay on the medication regularly, he is going to need to get an opionion from psych - either the [place] or [place] [facility] on [address] both have crisis centers where he can be seen that day by a psychiatric provider if he needs.

## 2014-09-06 NOTE — Telephone Encounter (Signed)
Gave pt message. He states understanding. Are you going to write him for a few as stated in the message?  [facility] [address]

## 2014-09-07 MED ORDER — LORAZEPAM 0.5 MG PO TABS: 0.5000 mg | ORAL_TABLET | Freq: Every day | ORAL | Status: DC | PRN

## 2014-09-07 NOTE — Telephone Encounter (Signed)
Printed but if you can just call it in that would be easiest for everyone  #15 only, no refills 0.'5mg'$  lorazapam qd prn.

## 2014-09-07 NOTE — Telephone Encounter (Signed)
Rx called in 

## 2014-09-08 ENCOUNTER — Other Ambulatory Visit: Payer: Self-pay

## 2014-09-08 NOTE — Telephone Encounter (Signed)
Lorazepam called into pharmacy, [facility] [address]

## 2014-10-09 ENCOUNTER — Encounter: Payer: Medicare Other | Admitting: Family Medicine

## 2015-01-17 ENCOUNTER — Other Ambulatory Visit: Payer: Self-pay | Admitting: Family Medicine

## 2015-01-23 ENCOUNTER — Telehealth: Payer: Self-pay | Admitting: Radiology

## 2015-01-23 NOTE — Telephone Encounter (Signed)
Ok to call in a refill for #15 tabs only of the lorazepam.  Please encourage pt to keep appt if he wants any additional refills.

## 2015-01-23 NOTE — Telephone Encounter (Signed)
On return call Pt. Stated he has plenty of Anafranil- interested in the Lorazepam Rx refill///Advised PT refill would require office visit/// scheduled 02/18/15 @ 9:00 am   [phone]

## 2015-01-23 NOTE — Telephone Encounter (Signed)
Patient has requested refill on [facility]il [facility], but I do not see this pendine Ok to refill Anafranil ?

## 2015-01-25 MED ORDER — LORAZEPAM 0.5 MG PO TABS: 0.5000 mg | ORAL_TABLET | Freq: Every day | ORAL | Status: DC | PRN

## 2015-01-25 NOTE — Telephone Encounter (Signed)
Called in Rx. Advised pt Dr. Raul Del message.

## 2015-01-25 NOTE — Addendum Note (Signed)
Addended by: Jannette Spanner on: 01/25/2015 09:26 AM   Modules accepted: Orders

## 2015-02-18 ENCOUNTER — Ambulatory Visit: Payer: Medicare Other | Admitting: Family Medicine

## 2015-03-03 ENCOUNTER — Other Ambulatory Visit: Payer: Self-pay | Admitting: Family Medicine

## 2015-03-03 MED ORDER — LORAZEPAM 0.5 MG PO TABS: 0.5000 mg | ORAL_TABLET | Freq: Every day | ORAL | Status: DC | PRN

## 2015-03-03 NOTE — Telephone Encounter (Signed)
We called him in a refill on lorazepam last mo on 10/3 and by the [facility] it looks like he didn't fill this so it should still be on file with his pharmacy.

## 2015-03-03 NOTE — Telephone Encounter (Signed)
Dr. Brigitte Pulse I called the pharmacy on file. They stated they did fill this Rx on 10/13. i wonder why it did not show up.

## 2015-03-03 NOTE — Telephone Encounter (Signed)
And that concerns me for others that might not be showing up. Ok to authorize #15, no refills but remind pt to use sparingly. And please have pharmacy fax me a copy of all of his controlled med fills for the past yr - just want to make sure other things aren't missing that I should know about.  Will reorder the lorazepam and put that it was phoned in. Thanks.

## 2015-03-03 NOTE — Telephone Encounter (Signed)
Refill on Lorazepam. Patient states that he has transportation troubles and is not able to make it in for a visit.   [phone]

## 2015-03-04 NOTE — Telephone Encounter (Signed)
Rx called in 

## 2015-06-07 ENCOUNTER — Other Ambulatory Visit: Payer: Self-pay | Admitting: Family Medicine

## 2015-07-21 ENCOUNTER — Other Ambulatory Visit: Payer: Self-pay | Admitting: Family Medicine

## 2015-08-20 ENCOUNTER — Telehealth: Payer: Self-pay | Admitting: Family Medicine

## 2015-08-20 ENCOUNTER — Other Ambulatory Visit: Payer: Self-pay | Admitting: Family Medicine

## 2015-08-20 NOTE — Telephone Encounter (Signed)
Patient request a refill on Anafranil 50 MG. [facility] [address] [phone]

## 2015-08-20 NOTE — Telephone Encounter (Signed)
Patient is calling to make sure Dr. Brigitte Pulse gets the refill request because the pharmacy closes at 6 today. I informed patient that refills take 24-72 hours.  [facility] [address]

## 2015-08-25 ENCOUNTER — Other Ambulatory Visit: Payer: Self-pay | Admitting: Family Medicine

## 2015-08-25 NOTE — Telephone Encounter (Signed)
This was RFd on 4/29

## 2015-09-04 ENCOUNTER — Other Ambulatory Visit: Payer: Self-pay | Admitting: Family Medicine

## 2015-09-06 NOTE — Telephone Encounter (Signed)
appt sch 6/15. Will give 1 mos RF until then.

## 2015-10-07 ENCOUNTER — Encounter: Payer: Self-pay | Admitting: Family Medicine

## 2015-10-07 ENCOUNTER — Ambulatory Visit (INDEPENDENT_AMBULATORY_CARE_PROVIDER_SITE_OTHER): Payer: Medicare Other | Admitting: Family Medicine

## 2015-10-07 VITALS — BP 137/78 | HR 101 | Temp 98.1°F | Resp 16 | Ht 76.0 in | Wt 179.0 lb

## 2015-10-07 DIAGNOSIS — Q8740 Marfan's syndrome, unspecified: Secondary | ICD-10-CM

## 2015-10-07 DIAGNOSIS — F17200 Nicotine dependence, unspecified, uncomplicated: Secondary | ICD-10-CM

## 2015-10-07 DIAGNOSIS — F341 Dysthymic disorder: Secondary | ICD-10-CM

## 2015-10-07 DIAGNOSIS — E785 Hyperlipidemia, unspecified: Secondary | ICD-10-CM | POA: Diagnosis not present

## 2015-10-07 MED ORDER — LORAZEPAM 1 MG PO TABS: 0.5000 mg | ORAL_TABLET | Freq: Three times a day (TID) | ORAL | Status: DC | PRN

## 2015-10-07 MED ORDER — CLOMIPRAMINE HCL 50 MG PO CAPS: ORAL_CAPSULE | ORAL | Status: DC

## 2015-10-07 NOTE — Progress Notes (Signed)
Subjective:    Patient ID: Christopher Dawson, male    DOB: 1955/09/04, 60 y.o.   MRN: [id number] Chief Complaint  Patient presents with  . Medication Refill    clomipramine    HPI  Christopher Dawson is a 60 yo male here today for review of his chronic medical conditions.  He is medical disabled due to Marfan's, lumbar back pain, and leg length discrepancy.  Psych: bipolar and some paranoia - depressive neurosis. He has been unable to follow-up with psych due to the cost but feels his medications and sxs have been stable for a long period.  I last saw him 16 mos prior for the same.  Has been well controlled on anafranil '100mg'$  qhs for decades (about 30 years).  Mood all right, sleeping all right.  His prior psych at [facility] - Dr Abner Greenspan. He really liked psych there but was upset when they charged him a large out of pocket fee.  HOwever, heis considering going back.  He reports that his anxiety really flaired about 6 mos ago - he tried 0.'5mg'$  and it didn't touch his sxs.  He then tried 1.'5mg'$  and it helped alittle but he lost the bottle.  He wants to try to take it for several days in a row to see if helps so he can quite drinking beer.  He has been self-medicating with alcohol and he reports that prev he did stop the ativan intentionally  HTN: Pt suspects white-coat but does not check BP outside the office.  Marfans:  Prev followed by [facility] and had not seen on cardiologist in decades as well.  should likely be on bb and acei/arb with echo to eval for aortic root dilatation as well as yearly optho exam. Has not seen optho in years as well.  Tob use: Continues but does not inhale and only smokes 2-3 cigs/d but has increased to 1 to 1 1/2 ppd but does not inhaler for the past 4 years.  Thinking about getting nicorette lozenges.  Worried   HPL: ldl elev at 164 when last checked 3 yrs ago; non-hdl 183.  He would like to get his lipid panel checked but is not fasting today so will come back next mo - no fat diet, no  dairy, vegan basically - he does eat yogurt and very lean chicken and occ lean red meat.  His father did have a cabg.  Past Medical History  Diagnosis Date  . Depression   . Anxiety   . Lumbar radiculopathy, chronic 2008  . Marfan syndrome   . Shortening, leg, congenital   . Neurosis, depressive    Past Surgical History  Procedure Laterality Date  . Leg surgery     No current outpatient prescriptions on file prior to visit.   No current facility-administered medications on file prior to visit.   Allergies  Allergen Reactions  . Codeine    Family History  Problem Relation Age of Onset  . Heart disease Mother    Social History   Social History  . Marital Status: Single    Spouse Name: N/A  . Number of Children: N/A  . Years of Education: N/A   Social History Main Topics  . Smoking status: Current Every Day Smoker  . Smokeless tobacco: None  . Alcohol Use: Yes  . Drug Use: No  . Sexual Activity: Not Asked   Other Topics Concern  . None   Social History Narrative     Review of Systems See  HPI    Objective:  BP 137/78 mmHg  Pulse 101  Temp(Src) 98.1 F (36.7 C) (Oral)  Resp 16  Ht '6\' 4"'$  (1.93 m)  Wt 179 lb (81.194 kg)  BMI 21.80 kg/m2  Physical Exam  Constitutional: He is oriented to person, place, and time. He appears well-developed and well-nourished. No distress.  HENT:  Head: Normocephalic and atraumatic.  Eyes: Conjunctivae are normal. Pupils are equal, round, and reactive to light. No scleral icterus.  Neck: Normal range of motion. Neck supple. No thyromegaly present.  Cardiovascular: Normal rate, regular rhythm, normal heart sounds and intact distal pulses.   Pulmonary/Chest: Effort normal and breath sounds normal. No respiratory distress.  Musculoskeletal: He exhibits no edema.  Lymphadenopathy:    He has no cervical adenopathy.  Neurological: He is alert and oriented to person, place, and time.  Skin: Skin is warm and dry. He is not  diaphoretic.  Psychiatric: His behavior is normal. His mood appears anxious. His affect is blunt. He exhibits a depressed mood.      Assessment & Plan:  Pt on disability with straight medicare so is cautions of trying to not wrack-up medical costs but would like to proceed with cardiology referral due to Marfans - knows he needs echo with eval of aortic root. 1. Marfan syndrome   2. Neurosis, depressive   3. Tobacco use disorder   4. Hyperlipidemia     Orders Placed This Encounter  Procedures  . Lipid panel    Standing Status: Future     Number of Occurrences:      Standing Expiration Date: 10/06/2016    Order Specific Question:  Has the patient fasted?    Answer:  Yes  . Comprehensive metabolic panel    Standing Status: Future     Number of Occurrences:      Standing Expiration Date: 10/06/2016    Order Specific Question:  Has the patient fasted?    Answer:  Yes  . Ambulatory referral to Cardiology    Referral Priority:  Routine    Referral Type:  Consultation    Referral Reason:  Specialty Services Required    Requested Specialty:  Cardiology    Number of Visits Requested:  1  . Ambulatory referral to Ophthalmology    Referral Priority:  Routine    Referral Type:  Consultation    Referral Reason:  Specialty Services Required    Requested Specialty:  Ophthalmology    Number of Visits Requested:  1    Meds ordered this encounter  Medications  . clomiPRAMINE (ANAFRANIL) 50 MG capsule    Sig: TAKE 2 CAPSULES (100 MG TOTAL) BY MOUTH AT BEDTIME.    Dispense:  60 capsule    Refill:  11  . LORazepam (ATIVAN) 1 MG tablet    Sig: Take 0.5-1 tablets (0.5-1 mg total) by mouth every 8 (eight) hours as needed for anxiety.    Dispense:  60 tablet    Refill:  1     Delman Cheadle, M.D.  [facility] [address] [place], [place] [address] [phone] phone [phone] fax  10/22/2015 10:02 AM ]

## 2015-10-07 NOTE — Patient Instructions (Addendum)
[facility] is now offering annual lung cancer screening by low-dose CT scan.  This is covered for qualifying patients and your insurance will be checked before the procedure.  Call the lung cancer screening nurse navigators at [phone] to learn more about this and get scheduled.  Smoking Cessation, Tips for Success If you are ready to quit smoking, congratulations! You have chosen to help yourself be healthier. Cigarettes bring nicotine, tar, carbon monoxide, and other irritants into your body. Your lungs, heart, and blood vessels will be able to work better without these poisons. There are many different ways to quit smoking. Nicotine gum, nicotine patches, a nicotine inhaler, or nicotine nasal spray can help with physical craving. Hypnosis, support groups, and medicines help break the habit of smoking. WHAT THINGS CAN I DO TO MAKE QUITTING EASIER?  Here are some tips to help you quit for good:  Pick a date when you will quit smoking completely. Tell all of your friends and family about your plan to quit on that date.  Do not try to slowly cut down on the number of cigarettes you are smoking. Pick a quit date and quit smoking completely starting on that day.  Throw away all cigarettes.   Clean and remove all ashtrays from your home, work, and car.  On a card, write down your reasons for quitting. Carry the card with you and read it when you get the urge to smoke.  Cleanse your body of nicotine. Drink enough water and fluids to keep your urine clear or pale yellow. Do this after quitting to flush the nicotine from your body.  Learn to predict your moods. Do not let a bad situation be your excuse to have a cigarette. Some situations in your life might tempt you into wanting a cigarette.  Never have "just one" cigarette. It leads to wanting another and another. Remind yourself of your decision to quit.  Change habits associated with smoking. If you smoked while driving or when feeling  stressed, try other activities to replace smoking. Stand up when drinking your coffee. Brush your teeth after eating. Sit in a different chair when you read the paper. Avoid alcohol while trying to quit, and try to drink fewer caffeinated beverages. Alcohol and caffeine may urge you to smoke.  Avoid foods and drinks that can trigger a desire to smoke, such as sugary or spicy foods and alcohol.  Ask people who smoke not to smoke around you.  Have something planned to do right after eating or having a cup of coffee. For example, plan to take a walk or exercise.  Try a relaxation exercise to calm you down and decrease your stress. Remember, you may be tense and nervous for the first 2 weeks after you quit, but this will pass.  Find new activities to keep your hands busy. Play with a pen, coin, or rubber band. Doodle or draw things on paper.  Brush your teeth right after eating. This will help cut down on the craving for the taste of tobacco after meals. You can also try mouthwash.   Use oral substitutes in place of cigarettes. Try using lemon drops, carrots, cinnamon sticks, or chewing gum. Keep them handy so they are available when you have the urge to smoke.  When you have the urge to smoke, try deep breathing.  Designate your home as a nonsmoking area.  If you are a heavy smoker, ask your health care provider about a prescription for nicotine chewing gum. It  can ease your withdrawal from nicotine.  Reward yourself. Set aside the cigarette money you save and buy yourself something nice.  Look for support from others. Join a support group or smoking cessation program. Ask someone at home or at work to help you with your plan to quit smoking.  Always ask yourself, "Do I need this cigarette or is this just a reflex?" Tell yourself, "Today, I choose not to smoke," or "I do not want to smoke." You are reminding yourself of your decision to quit.  Do not replace cigarette smoking with  electronic cigarettes (commonly called e-cigarettes). The safety of e-cigarettes is unknown, and some may contain harmful chemicals.  If you relapse, do not give up! Plan ahead and think about what you will do the next time you get the urge to smoke. HOW WILL I FEEL WHEN I QUIT SMOKING? You may have symptoms of withdrawal because your body is used to nicotine (the addictive substance in cigarettes). You may crave cigarettes, be irritable, feel very hungry, cough often, get headaches, or have difficulty concentrating. The withdrawal symptoms are only temporary. They are strongest when you first quit but will go away within 10-14 days. When withdrawal symptoms occur, stay in control. Think about your reasons for quitting. Remind yourself that these are signs that your body is healing and getting used to being without cigarettes. Remember that withdrawal symptoms are easier to treat than the major diseases that smoking can cause.  Even after the withdrawal is over, expect periodic urges to smoke. However, these cravings are generally short lived and will go away whether you smoke or not. Do not smoke! WHAT RESOURCES ARE AVAILABLE TO HELP ME QUIT SMOKING? Your health care provider can direct you to community resources or hospitals for support, which may include:  Group support.  Education.  Hypnosis.  Therapy.   This information is not intended to replace advice given to you by your health care provider. Make sure you discuss any questions you have with your health care provider.   Document Released: 01/07/2004 Document Revised: 05/01/2014 Document Reviewed: 09/26/2012 Elsevier Interactive Patient Education 2016 [employer] Inc.   Marfan Syndrome Marfan syndrome is a connective tissue disorder. Connective tissue is found throughout your body, and it helps to support your tissues and body organs. Marfan syndrome most commonly affects your bones, joints, eyes, heart, and blood vessels. Marfan  syndrome can lead to serious complications that are related to the heart, blood vessels, eyes, and lungs. CAUSES Marfan syndrome is caused by a gene mutation. The gene can be passed down from your parents (inherited), or the mutation can happen on its own. RISK FACTORS The main risk factor for Marfan syndrome is having a family history of Marfan syndrome.  SIGNS AND SYMPTOMS Symptoms of Marfan syndrome may vary from mild to severe. Common signs and symptoms may include:  Long arms and legs.  Long, thin fingers.  Curvature of the spine (scoliosis).  Nearsightedness and other vision problems.  A chest that sticks out (pectus carinatum) or looks sunken in (pectus excavatum).  Flat feet.  Crowded teeth.  Stretch marks on the skin.  Headaches or pain in the back, leg, or abdomen (due to an enlargement of the lining of the brain or spinal cord). Severe signs and symptoms may include:  Heart problems, especially with the large blood vessel that comes from the heart (aorta) and with the heart valves.  Eye problems, including a dislocated lens, cataracts, glaucoma, and retinal detachment.  The  collapse of a lung (spontaneous pneumothorax). DIAGNOSIS Marfan syndrome may be diagnosed by:  Medical history and physical exam.  Blood tests, which often include genetic testing.  MRI and CT scans.  Eye examination. This may include a slit-lamp examination. This checks to see if your eye lenses are out of place.  Heart examination. This may include an:  Echocardiogram (ECG). This creates an image of your heart, its valves, and the largest artery that carries blood from your heart to your body (aorta).  Electrocardiogram. This checks your heart rate and rhythm. TREATMENT There is no cure for Marfan syndrome. Treatment will focus on managing your symptoms and preventing or treating any heart or vision conditions that you may have. This may include a team of health care providers.  Treatment may involve:  Medicines:  To lower your blood pressure.  To reduce the strain on your heart.  To reduce your pain.  Eyeglasses, contact lenses, or eye surgery.  Watching and monitoring for any heart changes. Heart surgery may be required.  A back brace. If your scoliosis is severe, you may need surgery.  Surgery to correct the formation of your chest.  Placing a tube in your chest or doing surgery to treat a collapsed lung. HOME CARE INSTRUCTIONS  Take medicines only as directed by your health care provider.  Keep all follow-up visits as directed by your health care provider. This is important.  A support group for Marfan syndrome may help you to cope with any stress or issues that are related to your condition. Ask your health care provider for more information.  Before you have any medical or dental procedure done, tell all of your health care providers including dentists if you have had heart valve surgery.  Do not use any tobacco products, including cigarettes, chewing tobacco, or electronic cigarettes. If you need help quitting, ask your health care provider.  If you are planning on getting pregnant, talk with your health care provider.  Exercise as directed and as allowed by your health care provider. Do not participate in high-risk or contact sports or strenuous activities, such as football or soccer.   Wear your back brace, if necessary, as directed by your health care provider.  Wear supportive shoes or inserts for your feet if directed by your health care provider. SEEK MEDICAL CARE IF:  You have new or worsening symptoms.  Your legs are numb or painful.  You have a headache.  You have fatigue.  You are snoring more than usual, or you are having difficulty sleeping. SEEK IMMEDIATE MEDICAL CARE IF:  You have sudden or severe pain in your chest, back, or abdomen.  You have trouble breathing or have shortness of breath.  You pass out.  You  have changes in your vision, such as bright flashes, blurred vision, or blindness.  Your heart is beating rapidly or unevenly, or you have heart palpitations.  You have sudden pain on one side of your body. These symptoms may represent a serious problem that is an emergency. Do not wait to see if the symptoms will go away. Get medical help right away. Call your local emergency services (911 in the [place] Do not drive yourself to the hospital.   This information is not intended to replace advice given to you by your health care provider. Make sure you discuss any questions you have with your health care provider.   Document Released: 07/17/2000 Document Revised: 05/01/2014 Document Reviewed: 11/19/2013 Elsevier Interactive Patient Education 2016 Elsevier  Inc.  

## 2015-10-11 ENCOUNTER — Other Ambulatory Visit: Payer: Self-pay | Admitting: Family Medicine

## 2015-10-13 ENCOUNTER — Telehealth: Payer: Self-pay

## 2015-10-13 NOTE — Telephone Encounter (Signed)
Pt is needing to talk with dr Brigitte Pulse about the prescription of lorazapan '1mg'$  he states that he did not get the written rx when he was seen last   Best number [phone]

## 2015-10-13 NOTE — Telephone Encounter (Signed)
Called in Rx to [facility]. Pt notified.

## 2015-11-10 ENCOUNTER — Ambulatory Visit: Payer: Medicare Other

## 2015-11-12 ENCOUNTER — Telehealth: Payer: Self-pay

## 2015-11-12 NOTE — Telephone Encounter (Signed)
Msg is for Christopher Dawson, pt would like to see her as soon as possible in the walk in.  [phone]

## 2015-11-15 ENCOUNTER — Ambulatory Visit: Payer: Medicare Other

## 2015-11-15 NOTE — Telephone Encounter (Signed)
Pt advised to come in and he states he has a bill and has to wait until he has the money.

## 2016-07-23 ENCOUNTER — Encounter (HOSPITAL_COMMUNITY): Payer: Self-pay | Admitting: Emergency Medicine

## 2016-07-23 ENCOUNTER — Emergency Department (HOSPITAL_COMMUNITY)
Admission: EM | Admit: 2016-07-23 | Discharge: 2016-07-23 | Disposition: A | Discharge status: Home / Self Care | Payer: Medicare Other | Attending: Emergency Medicine | Admitting: Emergency Medicine

## 2016-07-23 DIAGNOSIS — R338 Other retention of urine: Secondary | ICD-10-CM

## 2016-07-23 DIAGNOSIS — R339 Retention of urine, unspecified: Secondary | ICD-10-CM | POA: Insufficient documentation

## 2016-07-23 DIAGNOSIS — F17200 Nicotine dependence, unspecified, uncomplicated: Secondary | ICD-10-CM | POA: Insufficient documentation

## 2016-07-23 LAB — CBC WITH DIFFERENTIAL/PLATELET
BASOS ABS: 0 10*3/uL (ref 0.0–0.1)
BASOS PCT: 0 %
Eosinophils Absolute: 0.1 10*3/uL (ref 0.0–0.7)
Eosinophils Relative: 1 %
HEMATOCRIT: 43.1 % (ref 39.0–52.0)
HEMOGLOBIN: 14.6 g/dL (ref 13.0–17.0)
Lymphocytes Relative: 15 %
Lymphs Abs: 1.4 10*3/uL (ref 0.7–4.0)
MCH: 30.9 pg (ref 26.0–34.0)
MCHC: 33.9 g/dL (ref 30.0–36.0)
MCV: 91.3 fL (ref 78.0–100.0)
MONOS PCT: 10 %
Monocytes Absolute: 1 10*3/uL (ref 0.1–1.0)
NEUTROS ABS: 6.8 10*3/uL (ref 1.7–7.7)
NEUTROS PCT: 74 %
Platelets: 192 10*3/uL (ref 150–400)
RBC: 4.72 MIL/uL (ref 4.22–5.81)
RDW: 13.3 % (ref 11.5–15.5)
WBC: 9.3 10*3/uL (ref 4.0–10.5)

## 2016-07-23 LAB — URINALYSIS, ROUTINE W REFLEX MICROSCOPIC
BACTERIA UA: NONE SEEN
BILIRUBIN URINE: NEGATIVE
Glucose, UA: NEGATIVE mg/dL
KETONES UR: NEGATIVE mg/dL
LEUKOCYTES UA: NEGATIVE
Nitrite: NEGATIVE
Protein, ur: NEGATIVE mg/dL
Specific Gravity, Urine: 1.008 (ref 1.005–1.030)
pH: 5 (ref 5.0–8.0)

## 2016-07-23 LAB — BASIC METABOLIC PANEL
ANION GAP: 10 (ref 5–15)
BUN: 10 mg/dL (ref 6–20)
CO2: 23 mmol/L (ref 22–32)
Calcium: 9.1 mg/dL (ref 8.9–10.3)
Chloride: 106 mmol/L (ref 101–111)
Creatinine, Ser: 0.71 mg/dL (ref 0.61–1.24)
GFR calc non Af Amer: >60 mL/min (ref >60)
Glucose, Bld: 92 mg/dL (ref 65–99)
Potassium: 3.5 mmol/L (ref 3.5–5.1)
SODIUM: 139 mmol/L (ref 135–145)

## 2016-07-23 NOTE — ED Provider Notes (Signed)
[facility] DEPT Provider Note   CSN: [id number] Arrival date & time: 07/23/16  0545     History   Chief Complaint Chief Complaint  Patient presents with  . Urinary Retention    HPI Christopher Dawson is a 61 y.o. male.  Christopher Dawson is a 61 y.o. Male who presents to the ED complaining of trouble urinating since last night. He reports feeling like he has to urinate, but only small amounts of urine are able to come out. He complains of pain to his middle of his lower abdomen. He denies pain to his penis or testicles. This has never happened to him previously. He denies fevers, chills, hematuria, rectal pain, testicular pain, penile pain, nausea, vomiting or rashes.   The history is provided by the patient and medical records. No language interpreter was used.  Urinary Frequency  Pertinent negatives include no chest pain, no abdominal pain, no headaches and no shortness of breath.    Past Medical History:  Diagnosis Date  . Anxiety   . Depression   . Lumbar radiculopathy, chronic 2008  . Marfan syndrome   . Neurosis, depressive   . Shortening, leg, congenital     Patient Active Problem List   Diagnosis Date Noted  . 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  . Abnormality of gait 04/12/2013  . 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 Tobacco use disorder 07/04/2012  . Anxiety state 07/04/2012  . Depression 07/04/2012    Past Surgical History:  Procedure Laterality Date  . LEG SURGERY         Home Medications    Prior to Admission medications   Medication Sig Start Date End Date Taking? Authorizing Provider  clomiPRAMINE (ANAFRANIL) 50 MG capsule TAKE 2 CAPSULES (100 MG TOTAL) BY MOUTH AT BEDTIME. 10/07/15  Yes Shawnee Knapp, MD  LORazepam (ATIVAN) 1 MG tablet Take 0.5-1 tablets (0.5-1 mg total) by mouth every 8 (eight) hours as needed for anxiety. 10/07/15  Yes Shawnee Knapp, MD    Family History Family History  Problem Relation Age of Onset  . Heart disease Mother     Social  History Social History  Substance Use Topics  . Smoking status: Current Every Day Smoker  . Smokeless tobacco: Never Used  . Alcohol use Yes     Allergies   Codeine   Review of Systems Review of Systems  Constitutional: Negative for chills and fever.  HENT: Negative for congestion and sore throat.   Eyes: Negative for visual disturbance.  Respiratory: Negative for cough and shortness of breath.   Cardiovascular: Negative for chest pain.  Gastrointestinal: Negative for abdominal pain, diarrhea, nausea and vomiting.  Genitourinary: Positive for decreased urine volume and difficulty urinating. Negative for discharge, dysuria, frequency, hematuria, penile pain and testicular pain.  Musculoskeletal: Negative for back pain and neck pain.  Skin: Negative for rash.  Neurological: Negative for headaches.     Physical Exam Updated Vital Signs BP 124/76 (BP Location: Left Arm)   Pulse (!) 101   Temp 97.6 F (36.4 C) (Oral)   Resp 18   Ht '6\' 4"'$  (1.93 m)   Wt 79.4 kg   SpO2 100%   BMI 21.30 kg/m   Physical Exam  Constitutional: He appears well-developed and well-nourished. No distress.  HENT:  Head: Normocephalic and atraumatic.  Mouth/Throat: Oropharynx is clear and moist.  Eyes: Conjunctivae are normal. Pupils are equal, round, and reactive to light. Right eye exhibits no discharge. Left eye exhibits no discharge.  Neck: Neck supple.  Cardiovascular: Regular rhythm, normal heart sounds and intact distal pulses.  Exam reveals no gallop and no friction rub.   No murmur heard. Heart rate is 108.  Pulmonary/Chest: Effort normal and breath sounds normal. No respiratory distress. He has no wheezes. He has no rales.  Abdominal: Soft. He exhibits no distension and no mass. There is tenderness. There is no guarding.  Mild suprapubic abdominal tenderness to palpation with palpable bladder.  Genitourinary: Penis normal. No penile tenderness.  Genitourinary Comments: GU exam is  unremarkable. No penile or testicular tenderness to palpation.  Musculoskeletal: He exhibits no edema.  Lymphadenopathy:    He has no cervical adenopathy.  Neurological: He is alert. Coordination normal.  Skin: Skin is warm and dry. Capillary refill takes less than 2 seconds. No rash noted. He is not diaphoretic. No erythema. No pallor.  Psychiatric: He has a normal mood and affect. His behavior is normal.  Nursing note and vitals reviewed.    ED Treatments / Results  Labs (all labs ordered are listed, but only abnormal results are displayed) Labs Reviewed  URINALYSIS, ROUTINE W REFLEX MICROSCOPIC - Abnormal; Notable for the following:       Result Value   Hgb urine dipstick MODERATE (*)    Squamous Epithelial / LPF 0-5 (*)    All other components within normal limits  BASIC METABOLIC PANEL  CBC WITH DIFFERENTIAL/PLATELET    EKG  EKG Interpretation None       Radiology No results found.  Procedures Procedures (including critical care time)  Medications Ordered in ED Medications - No data to display   Initial Impression / Assessment and Plan / ED Course  I have reviewed the triage vital signs and the nursing notes.  Pertinent labs & imaging results that were available during my care of the patient were reviewed by me and considered in my medical decision making (see chart for details).    This is a 61 y.o. Male who presents to the ED complaining of trouble urinating since last night. He reports feeling like he has to urinate, but only small amounts of urine are able to come out. He complains of pain to his middle of his lower abdomen. He denies pain to his penis or testicles. This has never happened to him previously.  On exam the patient is afebrile nontoxic appearing. His abdomen is soft and his mild suprapubic tenderness to palpation. Bladder is palpable. Bladder scan reveals greater than 999 mL's of urine in his bladder. No CVA or flank tenderness. We'll obtain  urinalysis, blood work and insert Foley catheter. Urinalysis shows no sign of infection. BMP shows preserved BUN and creatinine. Normal kidney function. CBC is unremarkable. At reevaluation after insertion of his Foley catheter patient reports he is feeling much better and back to normal. His heart rate has normalized. We'll discharge with Foley catheter and have him follow-up with urology. I discussed Foley catheter care. I discussed strict and specific return precautions. I advised the patient to follow-up with their primary care provider this week. I advised the patient to return to the emergency department with new or worsening symptoms or new concerns. The patient verbalized understanding and agreement with plan.     Final Clinical Impressions(s) / ED Diagnoses   Final diagnoses:  Acute urinary retention    New Prescriptions New Prescriptions   No medications on file     Waynetta Pean, PA-C 07/23/16 [place], MD 07/23/16 1416

## 2016-07-23 NOTE — ED Notes (Signed)
Bladder scan results: >999 mL.

## 2016-07-23 NOTE — ED Triage Notes (Signed)
Pt reports having frequent urination and pain for the last day.

## 2016-07-23 NOTE — ED Notes (Signed)
Leg bag applied to foley catheter.

## 2016-08-02 DIAGNOSIS — R339 Retention of urine, unspecified: Secondary | ICD-10-CM | POA: Diagnosis not present

## 2016-08-09 DIAGNOSIS — R338 Other retention of urine: Secondary | ICD-10-CM | POA: Diagnosis not present

## 2016-08-09 DIAGNOSIS — R339 Retention of urine, unspecified: Secondary | ICD-10-CM | POA: Diagnosis not present

## 2016-08-10 ENCOUNTER — Emergency Department (HOSPITAL_COMMUNITY)
Admission: EM | Admit: 2016-08-10 | Discharge: 2016-08-10 | Disposition: A | Discharge status: Home / Self Care | Payer: Medicare Other | Attending: Emergency Medicine | Admitting: Emergency Medicine

## 2016-08-10 ENCOUNTER — Encounter (HOSPITAL_COMMUNITY): Payer: Self-pay

## 2016-08-10 DIAGNOSIS — F17200 Nicotine dependence, unspecified, uncomplicated: Secondary | ICD-10-CM | POA: Diagnosis not present

## 2016-08-10 DIAGNOSIS — N3000 Acute cystitis without hematuria: Secondary | ICD-10-CM | POA: Diagnosis not present

## 2016-08-10 DIAGNOSIS — Z79899 Other long term (current) drug therapy: Secondary | ICD-10-CM | POA: Insufficient documentation

## 2016-08-10 DIAGNOSIS — R339 Retention of urine, unspecified: Secondary | ICD-10-CM | POA: Diagnosis present

## 2016-08-10 LAB — URINALYSIS, ROUTINE W REFLEX MICROSCOPIC
BILIRUBIN URINE: NEGATIVE
GLUCOSE, UA: NEGATIVE mg/dL
Ketones, ur: NEGATIVE mg/dL
NITRITE: POSITIVE — AB
Protein, ur: NEGATIVE mg/dL
SPECIFIC GRAVITY, URINE: 1.004 — AB (ref 1.005–1.030)
Squamous Epithelial / LPF: NONE SEEN
pH: 5 (ref 5.0–8.0)

## 2016-08-10 MED ORDER — CEPHALEXIN 500 MG PO CAPS
500.0000 mg | ORAL_CAPSULE | Freq: Three times a day (TID) | ORAL | 0 refills | Status: DC
Start: 2016-08-10 — End: 2017-05-13

## 2016-08-10 MED ORDER — CEPHALEXIN 500 MG PO CAPS
500.0000 mg | ORAL_CAPSULE | Freq: Once | ORAL | Status: AC
  Administered 2016-08-10: 500 mg via ORAL
  Filled 2016-08-10: qty 1

## 2016-08-10 NOTE — ED Triage Notes (Signed)
States saw urologist yesterday and not having problems and tonight hard to void states feels like he has to void all the time.

## 2016-08-10 NOTE — Discharge Instructions (Signed)
Your urine shows you have an infection. Take the antibiotics until gone.  Recheck if you get a fever, vomiting, worsening abdominal pain or you are unable to urinate.

## 2016-08-10 NOTE — ED Provider Notes (Signed)
[facility] DEPT Provider Note   CSN: [id number] Arrival date & time: 08/10/16  7353  By signing my name below, I, Oleh Genin, attest that this documentation has been prepared under the direction and in the presence of Rolland Porter, MD. Electronically Signed: Oleh Genin, Scribe. 08/10/16. 1:15 AM.  Time seen 01:09 AM   History   Chief Complaint Chief Complaint  Patient presents with  . Urinary Frequency    HPI Christopher Dawson is a 61 y.o. male with history "enlarged prostate" who presents to the ED for evaluation of urinary retention. This patient was initially seen at this facility on 3/31 with urinary retention; he had a foley catheter placed and was discharged to urology followup. On 4/11 he had his catheter removed and has had no issues since then. Was seen at urology this morning with a nurse practitioner and was asymptomatic. However in the last 3 hours he has been unable to void. At interview, he is reporting mild suprapubic pain. Worse when palpating. No distention. Denies nausea, vomiting, fever, or hematuria.   The history is provided by the patient. No language interpreter was used.   PCP Delman Cheadle, MD Urology Dr Louis Meckel  Past Medical History:  Diagnosis Date  . Anxiety   . Depression   . Lumbar radiculopathy, chronic 2008  . Marfan syndrome   . Neurosis, depressive   . Shortening, leg, congenital     Patient Active Problem List   Diagnosis Date Noted  . 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  . Abnormality of gait 04/12/2013  . 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 Tobacco use disorder 07/04/2012  . Anxiety state 07/04/2012  . Depression 07/04/2012    Past Surgical History:  Procedure Laterality Date  . LEG SURGERY         Home Medications    Prior to Admission medications   Medication Sig Start Date End Date Taking? Authorizing Provider  cephALEXin (KEFLEX) 500 MG capsule Take 1 capsule (500 mg total) by mouth 3 (three) times daily. 08/10/16   Rolland Porter, MD  clomiPRAMINE (ANAFRANIL) 50 MG capsule TAKE 2 CAPSULES (100 MG TOTAL) BY MOUTH AT BEDTIME. 10/07/15   [place]nee Kerrick Miler, MD  LORazepam (ATIVAN) 1 MG tablet Take 0.5-1 tablets (0.5-1 mg total) by mouth every 8 (eight) hours as needed for anxiety. 10/07/15   [place] Shalene Gallen, MD    Family History Family History  Problem Relation Age of Onset  . Heart disease Mother     Social History Social History  Substance Use Topics  . Smoking status: Current Every Day Smoker  . Smokeless tobacco: Never Used  . Alcohol use Yes     Allergies   Codeine   Review of Systems Review of Systems  Constitutional: Negative for fever.  Gastrointestinal: Positive for abdominal pain. Negative for nausea and vomiting.  Genitourinary: Positive for difficulty urinating. Negative for dysuria and hematuria.  All other systems reviewed and are negative.    Physical Exam Updated Vital Signs BP 122/90 (BP Location: Right Arm)   Pulse (!) 118   Temp 98.1 F (36.7 C) (Oral)   Resp 18   SpO2 99%   Vital signs normal except for tachycardia    Physical Exam  Constitutional: He is oriented to person, place, and time. He appears well-developed and well-nourished.  Non-toxic appearance. He does not appear ill. No distress.  HENT:  Head: Normocephalic and atraumatic.  Right Ear: External ear normal.  Left Ear: External ear normal.  Nose: Nose normal. No mucosal edema  or rhinorrhea.  Mouth/Throat: Oropharynx is clear and moist and mucous membranes are normal. No dental abscesses or uvula swelling.  Eyes: Conjunctivae and EOM are normal. Pupils are equal, round, and reactive to light.  Neck: Normal range of motion and full passive range of motion without pain. Neck supple.  Cardiovascular: Normal rate, regular rhythm and normal heart sounds.  Exam reveals no gallop and no friction rub.   No murmur heard. Pulmonary/Chest: Effort normal and breath sounds normal. No respiratory distress. He has no wheezes. He  has no rhonchi. He has no rales. He exhibits no tenderness and no crepitus.  Abdominal: Soft. Normal appearance and bowel sounds are normal. There is no rebound and no guarding.    Mild suprapubic tenderness with minimal distention. No masses.  Musculoskeletal: Normal range of motion. He exhibits no edema or tenderness.  Moves all extremities well.   Neurological: He is alert and oriented to person, place, and time. He has normal strength. No cranial nerve deficit.  Skin: Skin is warm, dry and intact. No rash noted. No erythema. No pallor.  Psychiatric: He has a normal mood and affect. His speech is normal and behavior is normal. His mood appears not anxious.  Nursing note and vitals reviewed.    ED Treatments / Results  Labs (all labs ordered are listed, but only abnormal results are displayed) Results for orders placed or performed during the hospital encounter of 08/10/16  Urinalysis, Routine w reflex microscopic- may I&O cath if menses  Result Value Ref Range   Color, Urine YELLOW YELLOW   APPearance CLOUDY (A) CLEAR   Specific Gravity, Urine 1.004 (L) 1.005 - 1.030   pH 5.0 5.0 - 8.0   Glucose, UA NEGATIVE NEGATIVE mg/dL   Hgb urine dipstick MODERATE (A) NEGATIVE   Bilirubin Urine NEGATIVE NEGATIVE   Ketones, ur NEGATIVE NEGATIVE mg/dL   Protein, ur NEGATIVE NEGATIVE mg/dL   Nitrite POSITIVE (A) NEGATIVE   Leukocytes, UA LARGE (A) NEGATIVE   RBC / HPF 0-5 0 - 5 RBC/hpf   WBC, UA TOO NUMEROUS TO COUNT 0 - 5 WBC/hpf   Bacteria, UA FEW (A) NONE SEEN   Squamous Epithelial / LPF NONE SEEN NONE SEEN   WBC Clumps PRESENT    Mucous PRESENT    Laboratory interpretation all normal except UTI       Radiology No results found.  Procedures Procedures (including critical care time)  Medications Ordered in ED Medications  cephALEXin (KEFLEX) capsule 500 mg (500 mg Oral Given 08/10/16 0325)     Initial Impression / Assessment and Plan / ED Course  I have reviewed the  triage vital signs and the nursing notes.  Pertinent labs & imaging results that were available during my care of the patient were reviewed by me and considered in my medical decision making (see chart for details).     1:13 AM  Informed the patient that he would receive another catheter in the emergency department as well as bladder scan was 236 cc after voiding. He verbalized agreement with this plan.   2:53 AM  Re-evaluated the patient at bedside. He is stating that his symptoms have significantly improved following urinating.  He denies any abdominal pain now. Still waiting for his UA to result.   UA is c/w a UTI, he had a urine culture sent and was started on keflex orally.     Final Clinical Impressions(s) / ED Diagnoses   Final diagnoses:  Acute cystitis without hematuria  New Prescriptions New Prescriptions   CEPHALEXIN (KEFLEX) 500 MG CAPSULE    Take 1 capsule (500 mg total) by mouth 3 (three) times daily.   Plan discharge  Rolland Porter, MD, Barbette Or, MD 08/10/16 [phone]

## 2016-08-13 LAB — URINE CULTURE: Culture: >=100000 — AB

## 2016-08-14 ENCOUNTER — Telehealth: Payer: Self-pay | Admitting: *Deleted

## 2016-08-14 NOTE — Telephone Encounter (Signed)
Post ED Visit - Positive Culture Follow-up: Unsuccessful Patient Follow-up  Culture assessed and recommendations reviewed by:  '[]'$  Elenor Quinones, Pharm.D. '[]'$  Heide Guile, Pharm.D., BCPS AQ-ID '[]'$  Parks Neptune, Pharm.D., BCPS '[]'$  Alycia Rossetti, Pharm.D., BCPS '[]'$  Michie, Pharm.D., BCPS, AAHIVP '[]'$  Legrand Como, Pharm.D., BCPS, AAHIVP '[]'$  Salome Arnt, PharmD, BCPS '[]'$  Dimitri Ped, PharmD, BCPS '[]'$  Vincenza Hews, PharmD, BCPS Alyse Low, PA-C  Positive urine culture   '[]'$  Patient discharged without antimicrobial prescription and treatment is now indicated '[x]'$  Organism is resistant to prescribed ED discharge antimicrobial '[]'$  Patient with positive blood cultures   Unable to contact patient after 3 attempts, letter will be sent to address on file  Ardeen Fillers 08/14/2016, 9:20 AM

## 2016-08-14 NOTE — Progress Notes (Signed)
ED Antimicrobial Stewardship Positive Culture Follow Up   Christopher Dawson is an 61 y.o. male who presented to [facility] on 08/10/2016 with a chief complaint of  Chief Complaint  Patient presents with  . Urinary Frequency    Recent Results (from the past 720 hour(s))  Urine culture     Status: Abnormal   Collection Time: 08/10/16  1:41 AM  Result Value Ref Range Status   Specimen Description URINE, CLEAN CATCH  Final   Special Requests NONE  Final   Culture (A)  Final    >=100,000 COLONIES/mL KLEBSIELLA OXYTOCA >=100,000 COLONIES/mL ENTEROBACTER SPECIES    Report Status 08/13/2016 FINAL  Final   Organism ID, Bacteria ENTEROBACTER SPECIES (A)  Final   Organism ID, Bacteria KLEBSIELLA OXYTOCA (A)  Final      Susceptibility   Enterobacter species - MIC*    CEFAZOLIN >=64 RESISTANT Resistant     CEFTRIAXONE <=1 SENSITIVE Sensitive     CIPROFLOXACIN <=0.25 SENSITIVE Sensitive     GENTAMICIN <=1 SENSITIVE Sensitive     IMIPENEM <=0.25 SENSITIVE Sensitive     NITROFURANTOIN 64 INTERMEDIATE Intermediate     TRIMETH/SULFA <=20 SENSITIVE Sensitive     PIP/TAZO <=4 SENSITIVE Sensitive     * >=100,000 COLONIES/mL ENTEROBACTER SPECIES   Klebsiella oxytoca - MIC*    AMPICILLIN >=32 RESISTANT Resistant     CEFAZOLIN <=4 SENSITIVE Sensitive     CEFTRIAXONE <=1 SENSITIVE Sensitive     CIPROFLOXACIN <=0.25 SENSITIVE Sensitive     GENTAMICIN <=1 SENSITIVE Sensitive     IMIPENEM <=0.25 SENSITIVE Sensitive     NITROFURANTOIN 64 INTERMEDIATE Intermediate     TRIMETH/SULFA <=20 SENSITIVE Sensitive     AMPICILLIN/SULBACTAM 8 SENSITIVE Sensitive     PIP/TAZO <=4 SENSITIVE Sensitive     Extended ESBL NEGATIVE Sensitive     * >=100,000 COLONIES/mL KLEBSIELLA OXYTOCA    '[x]'$  Treated with Keflex, organism resistant to prescribed antimicrobial  1) D/C Keflex   2) New antibiotic prescription: Ciprofloxacin 500 mg PO q12hr for 7 days   ED Provider: Alyse Low, PA-C   Argie Ramming,  PharmD Pharmacy Resident  Pager [phone] 08/14/16 8:48 AM

## 2016-10-12 DIAGNOSIS — Z125 Encounter for screening for malignant neoplasm of prostate: Secondary | ICD-10-CM | POA: Diagnosis not present

## 2016-10-14 ENCOUNTER — Other Ambulatory Visit: Payer: Self-pay | Admitting: Family Medicine

## 2016-10-14 NOTE — Telephone Encounter (Signed)
Patient needs office visit for any further medication refills. Needs to be seen within one month

## 2016-10-16 ENCOUNTER — Telehealth: Payer: Self-pay | Admitting: Family Medicine

## 2016-10-16 NOTE — Telephone Encounter (Signed)
LMOM FOR PT TO HAVE AN OV FOR MED REFILL WITHIN A MONTH

## 2016-10-19 DIAGNOSIS — N138 Other obstructive and reflux uropathy: Secondary | ICD-10-CM | POA: Diagnosis not present

## 2016-10-19 DIAGNOSIS — N401 Enlarged prostate with lower urinary tract symptoms: Secondary | ICD-10-CM | POA: Diagnosis not present

## 2016-10-24 ENCOUNTER — Telehealth: Payer: Self-pay | Admitting: Emergency Medicine

## 2016-10-24 NOTE — Telephone Encounter (Signed)
LOST TO FOLLOWUP 

## 2016-11-12 ENCOUNTER — Other Ambulatory Visit: Payer: Self-pay | Admitting: Family Medicine

## 2016-11-13 ENCOUNTER — Ambulatory Visit: Payer: Medicare Other | Admitting: Family Medicine

## 2016-11-13 NOTE — Telephone Encounter (Signed)
PT. Has apmt. On 12/14/16 and is req. Temp rx for clomiPRAMINE (ANAFRANIL) 50 MG capsule [[id number]]    Pt states he has to pay bill prior to being seen and will not have the funds until the first week of august.  Pharm: [facility]- [phone]  [phone]

## 2016-11-14 ENCOUNTER — Other Ambulatory Visit: Payer: Self-pay | Admitting: Family Medicine

## 2016-11-14 NOTE — Telephone Encounter (Signed)
Pt is calling to see if he could get a courtesy refill on his anafranil medication.  I did advise pt that Brigitte Pulse will not refill until he is seen again but he insisted that I put in another message.  Please advise [phone]

## 2016-11-14 NOTE — Telephone Encounter (Signed)
Left message we can refill medication until seen by Dr. Brigitte Pulse 12/14/16. Last ov 1 year ago.

## 2016-12-12 ENCOUNTER — Other Ambulatory Visit: Payer: Self-pay | Admitting: Family Medicine

## 2016-12-14 ENCOUNTER — Ambulatory Visit: Payer: Medicare Other | Admitting: Family Medicine

## 2016-12-15 ENCOUNTER — Telehealth: Payer: Self-pay | Admitting: Family Medicine

## 2016-12-15 NOTE — Telephone Encounter (Signed)
Adv pt note left from Dr. Brigitte Pulse on 6/23 and refill denial on 8/21. Pt did make appt for 9/6.  Pt would like a med refill for ANAFRANIL 50 MG capsule

## 2016-12-16 ENCOUNTER — Other Ambulatory Visit: Payer: Self-pay | Admitting: Family Medicine

## 2016-12-19 NOTE — Telephone Encounter (Signed)
20 tablets sent to pharmacy on 12/17/16. Needs to keep appointment for refills

## 2016-12-28 ENCOUNTER — Encounter: Payer: Self-pay | Admitting: Family Medicine

## 2016-12-28 ENCOUNTER — Ambulatory Visit (INDEPENDENT_AMBULATORY_CARE_PROVIDER_SITE_OTHER): Payer: Medicare Other | Admitting: Family Medicine

## 2016-12-28 VITALS — BP 140/82 | HR 104 | Temp 98.8°F | Resp 16 | Ht 75.2 in | Wt 184.0 lb

## 2016-12-28 DIAGNOSIS — F411 Generalized anxiety disorder: Secondary | ICD-10-CM

## 2016-12-28 DIAGNOSIS — Q8740 Marfan's syndrome, unspecified: Secondary | ICD-10-CM | POA: Diagnosis not present

## 2016-12-28 DIAGNOSIS — F17200 Nicotine dependence, unspecified, uncomplicated: Secondary | ICD-10-CM

## 2016-12-28 DIAGNOSIS — F3341 Major depressive disorder, recurrent, in partial remission: Secondary | ICD-10-CM

## 2016-12-28 DIAGNOSIS — R319 Hematuria, unspecified: Secondary | ICD-10-CM

## 2016-12-28 DIAGNOSIS — F341 Dysthymic disorder: Secondary | ICD-10-CM | POA: Diagnosis not present

## 2016-12-28 MED ORDER — CLOMIPRAMINE HCL 50 MG PO CAPS: ORAL_CAPSULE | ORAL | 3 refills | Status: DC

## 2016-12-28 MED ORDER — LORAZEPAM 1 MG PO TABS: 0.5000 mg | ORAL_TABLET | Freq: Two times a day (BID) | ORAL | 1 refills | Status: DC | PRN

## 2016-12-28 NOTE — Progress Notes (Signed)
Subjective:    Patient ID: Christopher Dawson, male    DOB: 01/25/1956, 61 y.o.   MRN: [id number] Chief Complaint  Patient presents with  . Depression    follow-up  . Anxiety    HPI  Mr. Christopher Dawson is a 61 yo male here today for review of his chronic medical conditions.  He is medical disabled due to Marfan's, lumbar back pain, and leg length discrepancy.  He had an episode of acute urinary retention from an enlarged prostate which was then followed by a UTI after catheter was removed - seen at [facility] by Dr. Lemmie Evens and PA Bri - last seen several weeks or a month ago. He states he had a PSA, DRE, and ua that was all normal - they told him no sign of cancer.  F/u in 1 yr.   Psych: bipolar and some paranoia - depressive neurosis. He has been unable to follow-up with psych due to the cost but feels his medications and sxs have been stable for a long period.  I last saw him 16 mos prior for the same.  Has been well controlled on anafranil 100mg  qhs for decades (about 30 years).  Mood all right, sleeping all right.  His prior psych at [facility] - Dr Abner Greenspan. He really liked psych there but was upset when they charged him a large out of pocket fee.  HOwever, heis considering going back.  He reports that his anxiety really flaired about 6 mos ago - he tried 0.5mg  and it didn't touch his sxs.  He then tried 1.5mg  and it helped alittle but he lost the bottle.  He wants to try to take it for several days in a row to see if helps so he can quite drinking beer.  He has been self-medicating with alcohol and he reports that prev he did stop the ativan intentionally. He has still one lorazepam left.  HTN: Pt suspects white-coat but does not check BP outside the office.  Marfans:  Prev followed by [facility] and had not seen on cardiologist in decades as well.  should likely be on bb and acei/arb with echo to eval for aortic root dilatation as well as yearly optho exam. Has not seen optho in years as well.  We referred patient  to cardiology and ophthalmology last year at his visit but there are no notes on the referral that state what happened with these.  Pt has been seen by Dr. Gershon Crane prior and knows he needs to go again.  Tob use: Continues but does not inhale and only smokes 2-3 cigs/d but has increased to 1 to 1 1/2 ppd but does not inhaler for the past 4 years.  Thinking about getting nicorette lozenges.  Worried   HPL: ldl elev at 164 when last checked 4 yrs ago; non-hdl 183.  He would like to get his lipid panel checked but is not fasting today so will come back next mo - no fat diet, no dairy, vegan basically - he does eat yogurt and very lean chicken and occ lean red meat.  His father did have a cabg.  Going to go to the dentist.   Past Medical History:  Diagnosis Date  . Anxiety   . Depression   . Lumbar radiculopathy, chronic 2008  . Marfan syndrome   . Neurosis, depressive   . Shortening, leg, congenital    Past Surgical History:  Procedure Laterality Date  . LEG SURGERY     Current Outpatient Prescriptions  on File Prior to Visit  Medication Sig Dispense Refill  . ANAFRANIL 50 MG capsule TAKE 2 CAPSULES BY MOUTH AT BEDTIME *NEED APPOINTMENT FOR REFILLS* 20 capsule 0  . cephALEXin (KEFLEX) 500 MG capsule Take 1 capsule (500 mg total) by mouth 3 (three) times daily. 30 capsule 0  . LORazepam (ATIVAN) 1 MG tablet Take 0.5-1 tablets (0.5-1 mg total) by mouth every 8 (eight) hours as needed for anxiety. 60 tablet 1   No current facility-administered medications on file prior to visit.    Allergies  Allergen Reactions  . Codeine Other (See Comments)    "spaced out"   Family History  Problem Relation Age of Onset  . Heart disease Mother    Social History   Social History  . Marital status: Single    Spouse name: N/A  . Number of children: N/A  . Years of education: N/A   Social History Main Topics  . Smoking status: Current Every Day Smoker  . Smokeless tobacco: Never Used  . Alcohol  use Yes  . Drug use: No  . Sexual activity: Not on file   Other Topics Concern  . Not on file   Social History Narrative  . No narrative on file     Review of Systems See HPI    Objective:  There were no vitals taken for this visit.  Physical Exam  Constitutional: He is oriented to person, place, and time. He appears well-developed and well-nourished. No distress.  HENT:  Head: Normocephalic and atraumatic.  Eyes: Pupils are equal, round, and reactive to light. Conjunctivae are normal. No scleral icterus.  Neck: Normal range of motion. Neck supple. No thyromegaly present.  Cardiovascular: Normal rate, regular rhythm, normal heart sounds and intact distal pulses.   Pulmonary/Chest: Effort normal and breath sounds normal. No respiratory distress.  Musculoskeletal: He exhibits no edema.  Lymphadenopathy:    He has no cervical adenopathy.  Neurological: He is alert and oriented to person, place, and time.  Skin: Skin is warm and dry. He is not diaphoretic.  Psychiatric: His behavior is normal. His mood appears anxious. His affect is blunt. He exhibits a depressed mood.     EKG: NSR, no acute ischemic changes noted. No prior EKG available for comparison.   I have personally reviewed the EKG tracing and agree with the computer interpretation of non-specific changes.  Assessment & Plan:  Pt on disability with straight medicare so is cautions of trying to not wrack-up medical costs so would like to defer cardiology referral due to Marfans - knows he needs echo with eval of aortic root. Reluctantly agrees to do EKG today since cardiology will be deferred for sev more mos.   Will prioritize optho eval  Will call for referral whenever he is ready   1. Marfan syndrome   2. Neurosis, depressive   3. Tobacco use disorder   4. Hyperlipidemia    Using bzd very sparingly - only #60 in past yr and still has a few left. Cont anafranil.  Orders Placed This Encounter  Procedures  . Lipid  panel    Standing Status: Future     Number of Occurrences:      Standing Expiration Date: 10/06/2016    Order Specific Question:  Has the patient fasted?    Answer:  Yes  . Comprehensive metabolic panel    Standing Status: Future     Number of Occurrences:      Standing Expiration Date: 10/06/2016  Order Specific Question:  Has the patient fasted?    Answer:  Yes  . Ambulatory referral to Cardiology    Referral Priority:  Routine    Referral Type:  Consultation    Referral Reason:  Specialty Services Required    Requested Specialty:  Cardiology    Number of Visits Requested:  1  . Ambulatory referral to Ophthalmology    Referral Priority:  Routine    Referral Type:  Consultation    Referral Reason:  Specialty Services Required    Requested Specialty:  Ophthalmology    Number of Visits Requested:  1    Meds ordered this encounter  Medications  . clomiPRAMINE (ANAFRANIL) 50 MG capsule    Sig: TAKE 2 CAPSULES (100 MG TOTAL) BY MOUTH AT BEDTIME.    Dispense:  60 capsule    Refill:  11  . LORazepam (ATIVAN) 1 MG tablet    Sig: Take 0.5-1 tablets (0.5-1 mg total) by mouth every 8 (eight) hours as needed for anxiety.    Dispense:  60 tablet    Refill:  1     Delman Cheadle, M.D.  [facility] [address] [place], [place] [address] [phone] phone [phone] fax  12/28/16 1:33 PM ]

## 2016-12-28 NOTE — Patient Instructions (Addendum)
is now offering annual lung cancer screening by low-dose CT scan.  This is covered for qualifying patients and your insurance will be checked before the procedure.  Call the lung cancer screening nurse navigators at [phone] to learn more about this and get scheduled.   Please call me for a referral for lung cancer screening and a referral to the eye doctor whenever you are ready. I have referred you to [facility] which is on [address] [place].  IF you received an x-ray today, you will receive an invoice from [facility]. Please contact [facility] at [phone] with questions or concerns regarding your invoice.   IF you received labwork today, you will receive an invoice from [facility]. Please contact [facility] at [phone] with questions or concerns regarding your invoice.   Our billing staff will not be able to assist you with questions regarding bills from these companies.  You will be contacted with the lab results as soon as they are available. The fastest way to get your results is to activate your My Chart account. Instructions are located on the last page of this paperwork. If you have not heard from [place] regarding the results in 2 weeks, please contact this office.     Lung Cancer Screening A lung cancer screening is a test that checks for lung cancer. Lung cancer screening is done to look for lung cancer in its very early stages, before it spreads and becomes harder to treat and before symptoms appear. Finding cancer early improves the chances of successful treatment. It may save your life. Should I be screened for lung cancer? You should be screened for lung cancer if all of these apply:  You currently smoke or you have quit smoking within the past 15 years.  You are 13-96 years old. Screening may be recommended up to age 42 depending on your overall health and other factors.  You are in good general health.  You have  a 30-pack-year smoking history.  To find your pack-year history, multiply how many packages of cigarettes you smoked each day by the number of years you smoked. For example, if you smoked two packs of cigarettes each day for 15 years, your pack-year history is 30. If you are not sure what your pack-year smoking history is, ask your health care provider. Screening may also be recommended if you are at high risk for the disease. You may be at high risk if:  You have a family history of lung cancer.  You have been exposed to asbestos.  You have chronic obstructive pulmonary disease (COPD).  You have a history of previous lung cancer.  How often should I be screened for lung cancer? If you are at risk for lung cancer, it is recommended that you are screened once a year. The recommended screening test is a low-dose CT scan. How can I lower my risk of lung cancer? To lower your risk of developing lung cancer:  If you smoke, stop smoking all tobacco products.  Avoid secondhand smoke.  Avoid exposure to radiation.  Avoid exposure to radon gas. Have your home checked for radon regularly.  Avoid things that cause cancer (carcinogens).  Avoid living or working in places with high air pollution.  Where to find more information: Ask your health care provider about the risks and benefits of screening. More information and resources are available from these organizations:  [place] (ACS): www.cancer.org  American Lung Association: www.lung.org  Contact  a health care provider if:  You start to show symptoms of lung cancer, including: ? Coughing that will not go away. ? Wheezing. ? Chest pain. ? Coughing up blood. ? Shortness of breath. ? Weight loss that cannot be explained. ? Constant fatigue. Summary  Lung cancer screening may find lung cancer before symptoms appear. Finding cancer early improves the chances of successful treatment. It may save your life.  If you  are at risk for lung cancer, it is recommended that you are screened once a year. The recommended screening test is a low-dose CT scan.  You can make lifestyle changes to lower your risk of lung cancer.  Ask your health care provider about the risks and benefits of screening. This information is not intended to replace advice given to you by your health care provider. Make sure you discuss any questions you have with your health care provider. Document Released: 03/01/2016 Document Revised: 03/01/2016 Document Reviewed: 03/01/2016 Elsevier Interactive Patient Education  [employer].

## 2017-05-04 ENCOUNTER — Other Ambulatory Visit: Payer: Self-pay | Admitting: Family Medicine

## 2017-05-07 NOTE — Telephone Encounter (Signed)
Requesting Ativan, please advise. Thank you.

## 2017-05-07 NOTE — Telephone Encounter (Signed)
Medication refill. Last office visit 12/28/16. Thanks.

## 2017-05-09 ENCOUNTER — Emergency Department (HOSPITAL_COMMUNITY): Payer: Medicare Other

## 2017-05-09 ENCOUNTER — Inpatient Hospital Stay (HOSPITAL_COMMUNITY)
Admission: EM | Admit: 2017-05-09 | Discharge: 2017-05-13 | DRG: 194 | Disposition: A | Discharge status: Home / Self Care | Payer: Medicare Other | Attending: Internal Medicine | Admitting: Internal Medicine

## 2017-05-09 ENCOUNTER — Encounter (HOSPITAL_COMMUNITY): Payer: Self-pay | Admitting: *Deleted

## 2017-05-09 ENCOUNTER — Inpatient Hospital Stay (HOSPITAL_COMMUNITY): Payer: Medicare Other

## 2017-05-09 ENCOUNTER — Other Ambulatory Visit: Payer: Self-pay

## 2017-05-09 DIAGNOSIS — J1000 Influenza due to other identified influenza virus with unspecified type of pneumonia: Secondary | ICD-10-CM | POA: Diagnosis not present

## 2017-05-09 DIAGNOSIS — F17200 Nicotine dependence, unspecified, uncomplicated: Secondary | ICD-10-CM | POA: Diagnosis not present

## 2017-05-09 DIAGNOSIS — Q8740 Marfan's syndrome, unspecified: Secondary | ICD-10-CM | POA: Diagnosis not present

## 2017-05-09 DIAGNOSIS — R5383 Other fatigue: Secondary | ICD-10-CM | POA: Diagnosis not present

## 2017-05-09 DIAGNOSIS — F17210 Nicotine dependence, cigarettes, uncomplicated: Secondary | ICD-10-CM | POA: Diagnosis present

## 2017-05-09 DIAGNOSIS — J189 Pneumonia, unspecified organism: Secondary | ICD-10-CM

## 2017-05-09 DIAGNOSIS — D6959 Other secondary thrombocytopenia: Secondary | ICD-10-CM | POA: Diagnosis present

## 2017-05-09 DIAGNOSIS — F329 Major depressive disorder, single episode, unspecified: Secondary | ICD-10-CM | POA: Diagnosis present

## 2017-05-09 DIAGNOSIS — F419 Anxiety disorder, unspecified: Secondary | ICD-10-CM | POA: Diagnosis present

## 2017-05-09 DIAGNOSIS — Z885 Allergy status to narcotic agent status: Secondary | ICD-10-CM | POA: Diagnosis not present

## 2017-05-09 DIAGNOSIS — F10230 Alcohol dependence with withdrawal, uncomplicated: Secondary | ICD-10-CM | POA: Diagnosis present

## 2017-05-09 DIAGNOSIS — D72819 Decreased white blood cell count, unspecified: Secondary | ICD-10-CM | POA: Insufficient documentation

## 2017-05-09 DIAGNOSIS — R531 Weakness: Secondary | ICD-10-CM | POA: Diagnosis not present

## 2017-05-09 DIAGNOSIS — R42 Dizziness and giddiness: Secondary | ICD-10-CM | POA: Diagnosis not present

## 2017-05-09 DIAGNOSIS — J101 Influenza due to other identified influenza virus with other respiratory manifestations: Secondary | ICD-10-CM | POA: Diagnosis present

## 2017-05-09 DIAGNOSIS — R404 Transient alteration of awareness: Secondary | ICD-10-CM | POA: Diagnosis not present

## 2017-05-09 DIAGNOSIS — F1011 Alcohol abuse, in remission: Secondary | ICD-10-CM | POA: Diagnosis present

## 2017-05-09 DIAGNOSIS — J181 Lobar pneumonia, unspecified organism: Secondary | ICD-10-CM | POA: Diagnosis not present

## 2017-05-09 DIAGNOSIS — R55 Syncope and collapse: Secondary | ICD-10-CM | POA: Diagnosis not present

## 2017-05-09 DIAGNOSIS — F1010 Alcohol abuse, uncomplicated: Secondary | ICD-10-CM | POA: Diagnosis not present

## 2017-05-09 DIAGNOSIS — F10930 Alcohol use, unspecified with withdrawal, uncomplicated: Secondary | ICD-10-CM

## 2017-05-09 DIAGNOSIS — R05 Cough: Secondary | ICD-10-CM | POA: Diagnosis not present

## 2017-05-09 LAB — BASIC METABOLIC PANEL
Anion gap: 9 (ref 5–15)
BUN: 23 mg/dL — ABNORMAL HIGH (ref 6–20)
CO2: 24 mmol/L (ref 22–32)
CREATININE: 0.96 mg/dL (ref 0.61–1.24)
Calcium: 7.9 mg/dL — ABNORMAL LOW (ref 8.9–10.3)
Chloride: 99 mmol/L — ABNORMAL LOW (ref 101–111)
GFR calc non Af Amer: >60 mL/min (ref >60)
GLUCOSE: 112 mg/dL — AB (ref 65–99)
Potassium: 3.6 mmol/L (ref 3.5–5.1)
Sodium: 132 mmol/L — ABNORMAL LOW (ref 135–145)

## 2017-05-09 LAB — CBC WITH DIFFERENTIAL/PLATELET
BASOS ABS: 0 10*3/uL (ref 0.0–0.1)
Basophils Relative: 0 %
EOS ABS: 0 10*3/uL (ref 0.0–0.7)
Eosinophils Relative: 0 %
HCT: 45.4 % (ref 39.0–52.0)
Hemoglobin: 15.5 g/dL (ref 13.0–17.0)
LYMPHS ABS: 0.4 10*3/uL — AB (ref 0.7–4.0)
Lymphocytes Relative: 12 %
MCH: 31.3 pg (ref 26.0–34.0)
MCHC: 34.1 g/dL (ref 30.0–36.0)
MCV: 91.7 fL (ref 78.0–100.0)
Monocytes Absolute: 0.3 10*3/uL (ref 0.1–1.0)
Monocytes Relative: 9 %
NEUTROS PCT: 79 %
Neutro Abs: 3 10*3/uL (ref 1.7–7.7)
PLATELETS: 112 10*3/uL — AB (ref 150–400)
RBC: 4.95 MIL/uL (ref 4.22–5.81)
RDW: 12.8 % (ref 11.5–15.5)
WBC: 3.7 10*3/uL — AB (ref 4.0–10.5)

## 2017-05-09 LAB — URINALYSIS, COMPLETE (UACMP) WITH MICROSCOPIC
BILIRUBIN URINE: NEGATIVE
Bacteria, UA: NONE SEEN
GLUCOSE, UA: NEGATIVE mg/dL
Ketones, ur: 20 mg/dL — AB
LEUKOCYTES UA: NEGATIVE
Nitrite: NEGATIVE
PH: 5 (ref 5.0–8.0)
Protein, ur: 30 mg/dL — AB
Specific Gravity, Urine: 1.023 (ref 1.005–1.030)

## 2017-05-09 LAB — CBG MONITORING, ED: GLUCOSE-CAPILLARY: 130 mg/dL — AB (ref 65–99)

## 2017-05-09 LAB — I-STAT TROPONIN, ED: Troponin i, poc: 0.02 ng/mL (ref 0.00–0.08)

## 2017-05-09 LAB — I-STAT CG4 LACTIC ACID, ED: LACTIC ACID, VENOUS: 0.84 mmol/L (ref 0.5–1.9)

## 2017-05-09 LAB — MAGNESIUM: Magnesium: 2.6 mg/dL — ABNORMAL HIGH (ref 1.7–2.4)

## 2017-05-09 LAB — RAPID URINE DRUG SCREEN, HOSP PERFORMED
Amphetamines: NOT DETECTED
BARBITURATES: NOT DETECTED
BENZODIAZEPINES: NOT DETECTED
COCAINE: NOT DETECTED
Opiates: NOT DETECTED
Tetrahydrocannabinol: NOT DETECTED

## 2017-05-09 LAB — ETHANOL

## 2017-05-09 LAB — INFLUENZA PANEL BY PCR (TYPE A & B)
Influenza A By PCR: POSITIVE — AB
Influenza B By PCR: NEGATIVE

## 2017-05-09 LAB — PROCALCITONIN: PROCALCITONIN: 0.1 ng/mL

## 2017-05-09 IMAGING — CR DG CHEST 2V
4 series · 4 of 4 positions shown · non-contrast
Comparison: None.

CLINICAL DATA: Dizziness and cough

EXAM:
CHEST  2 VIEW

[w chest lat (1 of 2)]
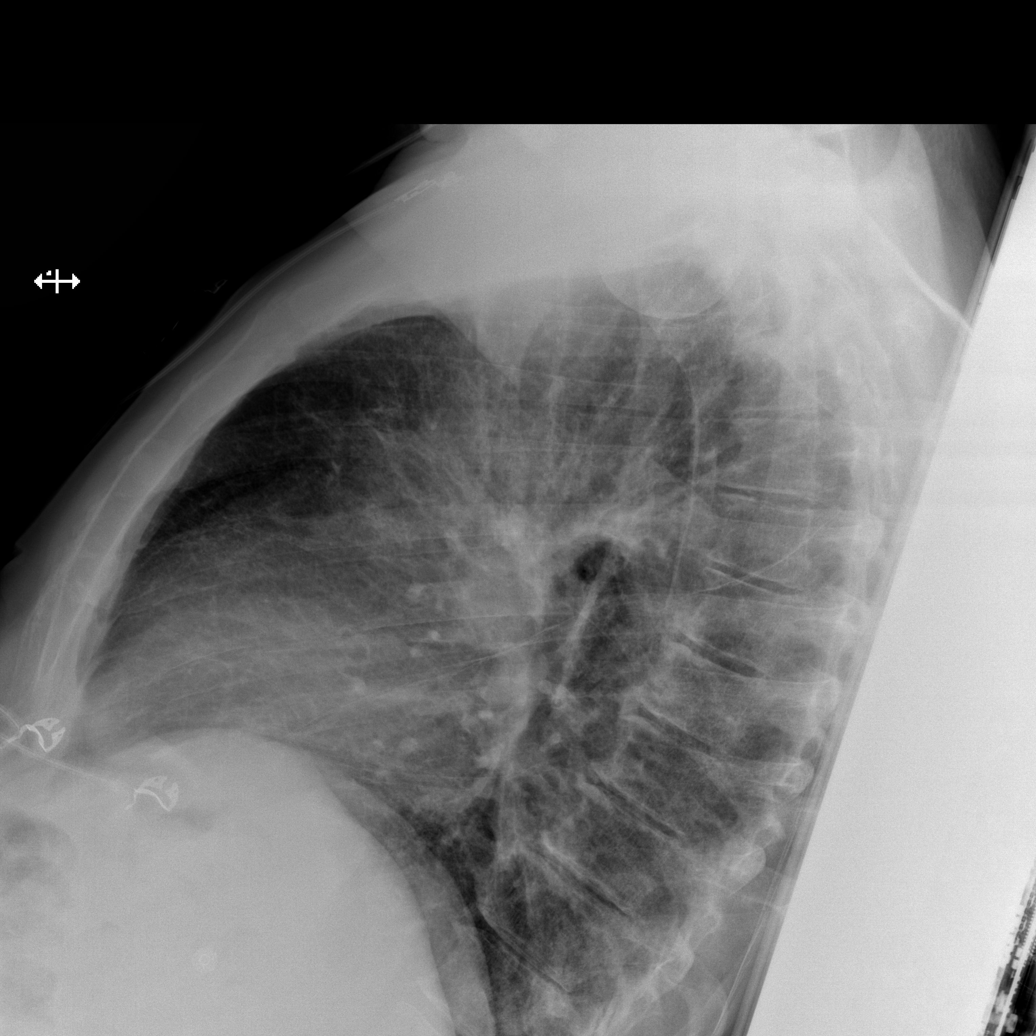

[w chest lat (2 of 2)]
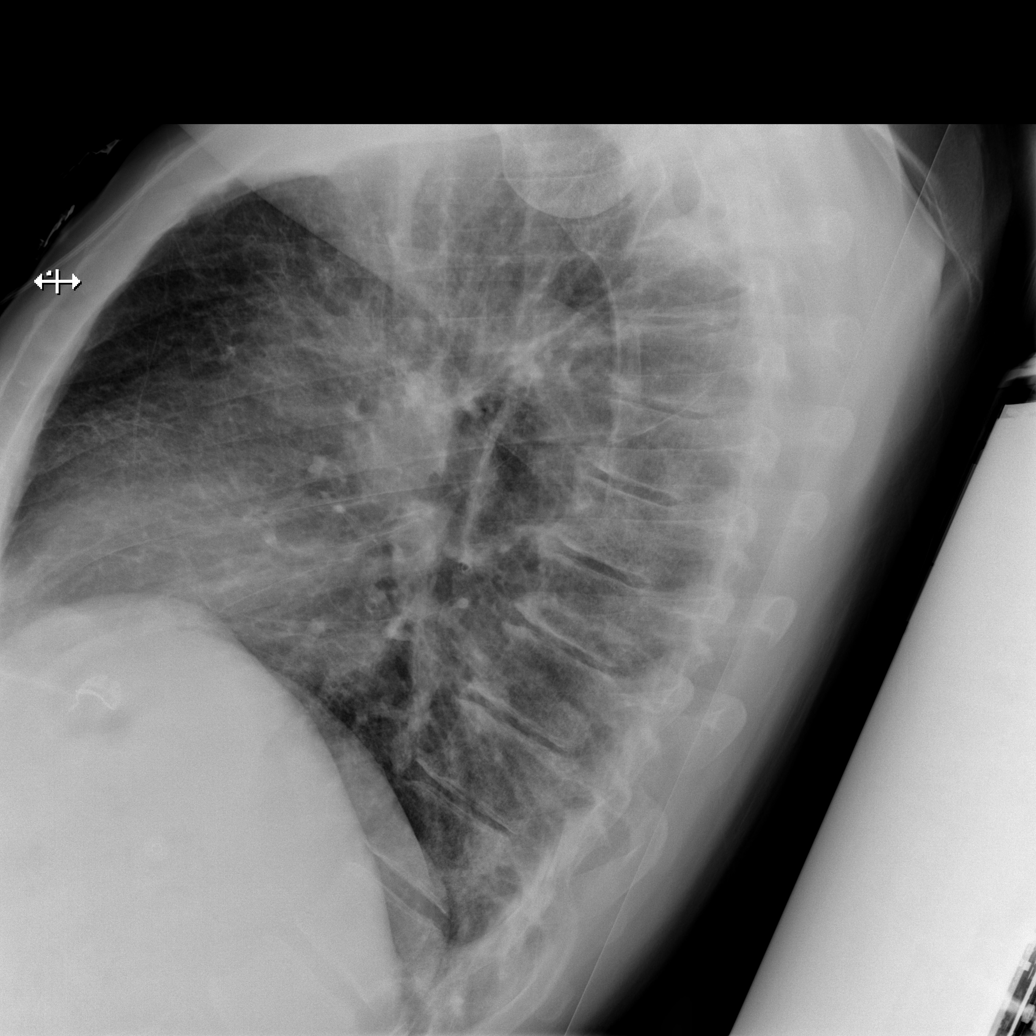

[x chest ap (1 of 2)]
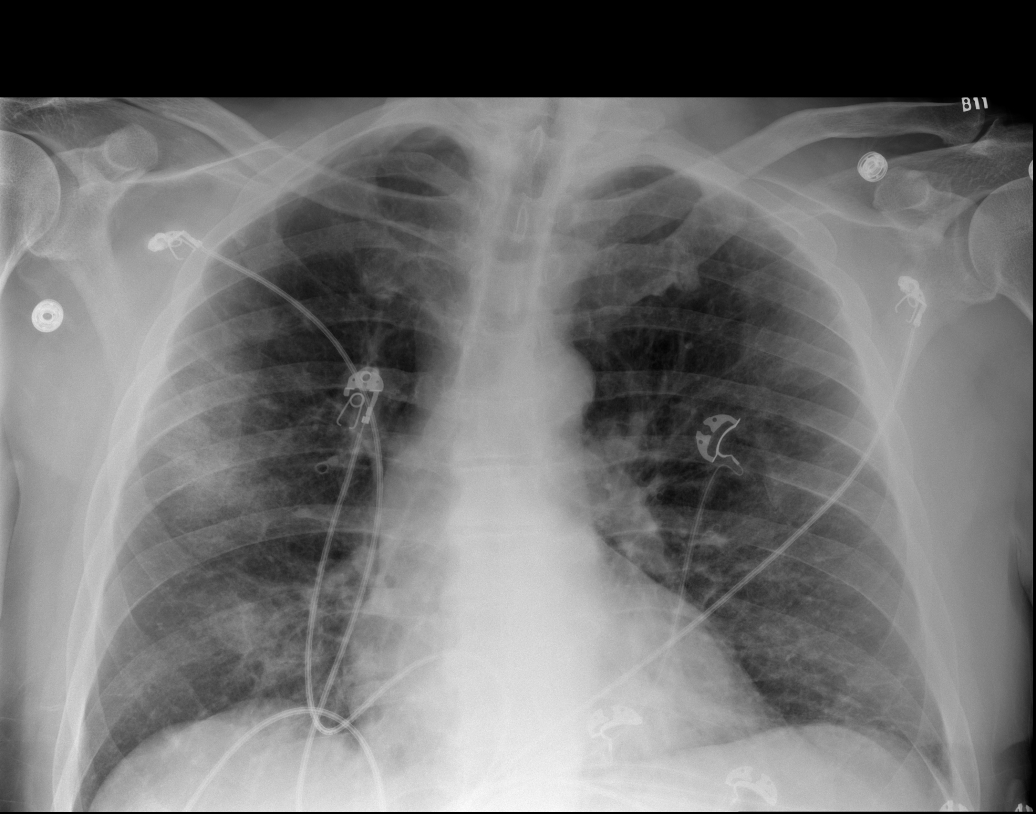

[x chest ap (2 of 2)]
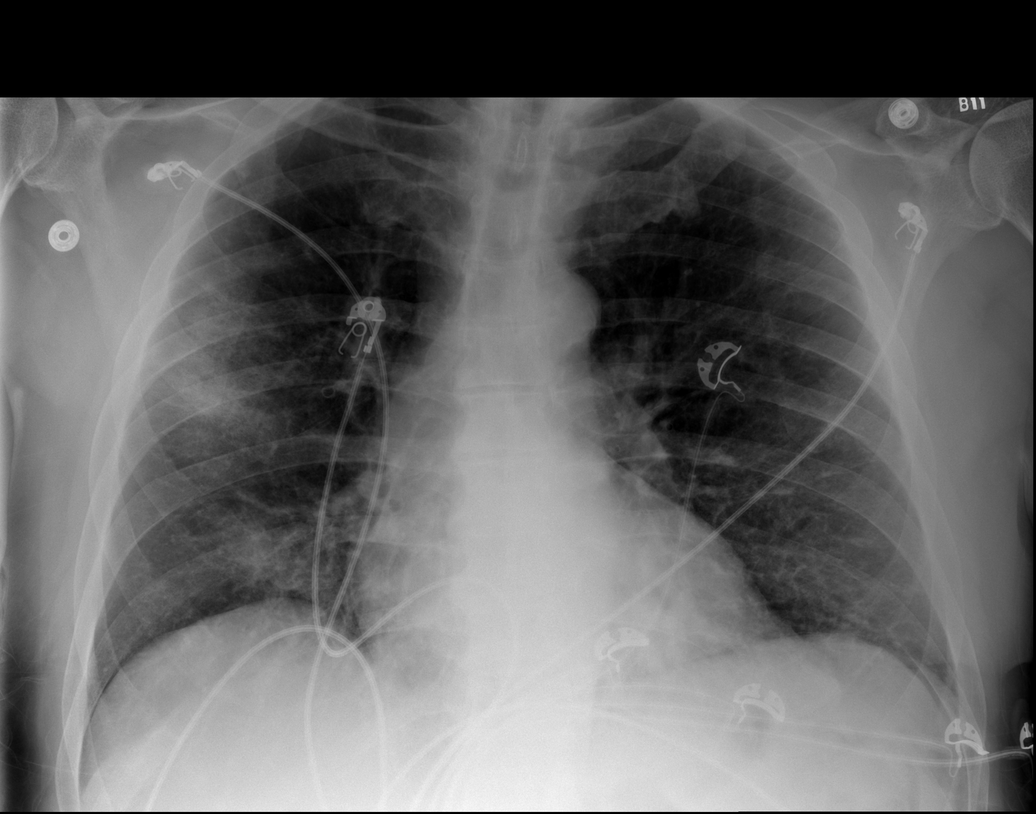

[4 of 4 positions shown; findings below may reference images not displayed]

FINDINGS: There is airspace consolidation throughout portions of the right
upper and right middle lobe regions. There is mild atelectatic
change in the left base. Left lung is otherwise clear. Heart is
upper normal in size with pulmonary vascularity within normal
limits. No adenopathy. No bone lesions.
IMPRESSION: Areas of airspace consolidation consistent with pneumonia in the
right upper and right middle lobes. Mild left base atelectasis.
Heart size within normal limits.

Followup PA and lateral chest radiographs recommended in 3-4 weeks
following trial of antibiotic therapy to ensure resolution and
exclude underlying malignancy.

## 2017-05-09 IMAGING — CT CT HEAD W/O CM
3 series · 16 of 47 positions shown, 19 images · non-contrast
Comparison: None.

CLINICAL DATA: Dizziness with generalize weakness

EXAM:
CT HEAD WITHOUT CONTRAST
TECHNIQUE: Contiguous axial images were obtained from the base of the skull
through the vertex without intravenous contrast.

[Series 2: head wo · axial · 0.44mm/px · z∈[-182,-42]mm · 10 of 34 slices shown, 13 images]
[im 3/34  brain]
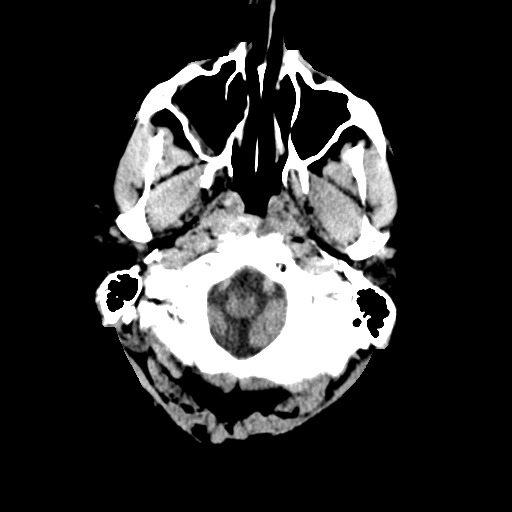
[im 3/34  bone]
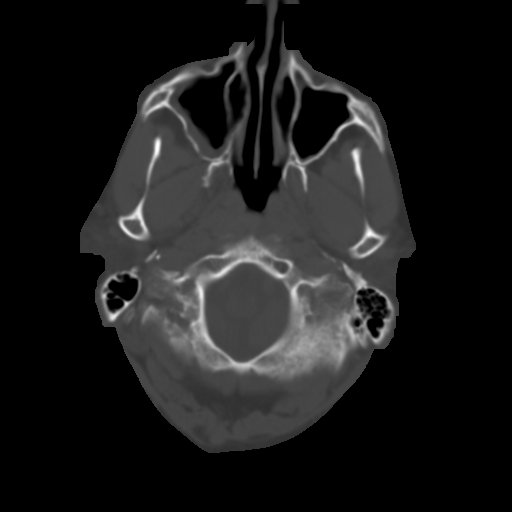
[im 6/34  brain]
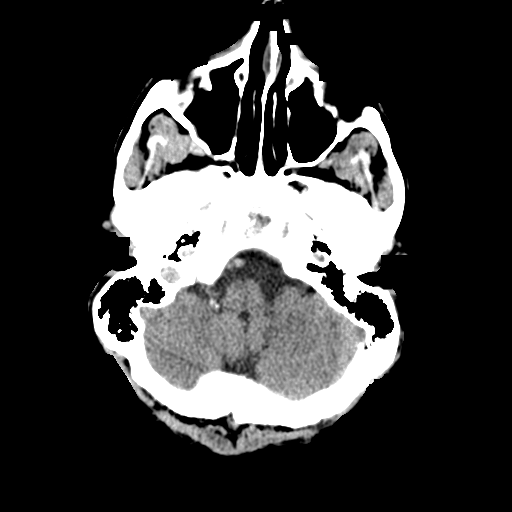
[im 10/34  brain]
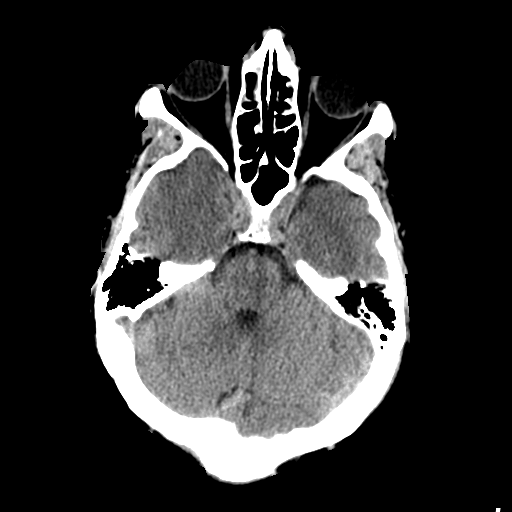
[im 12/34  brain]
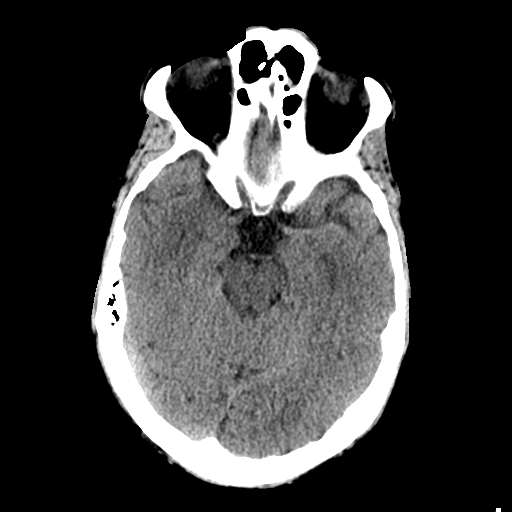
[im 15/34  brain]
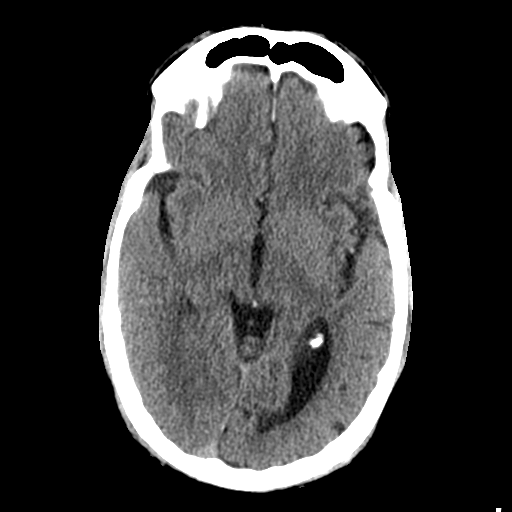
[im 15/34  bone]
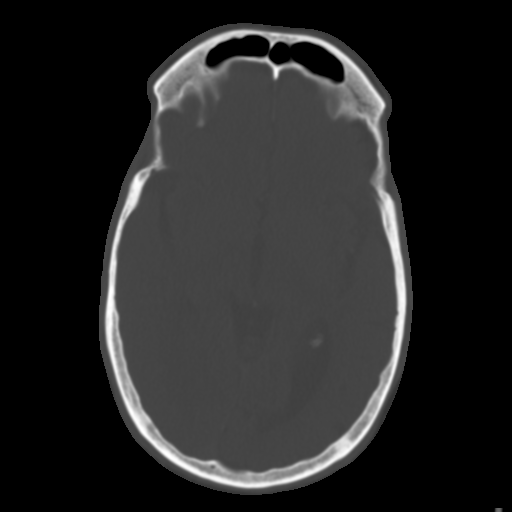
[im 19/34  brain]
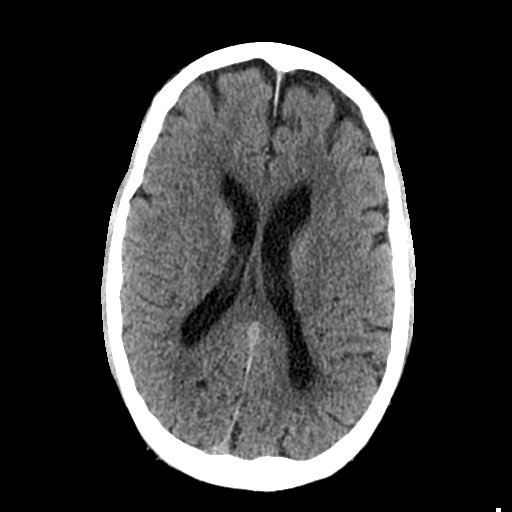
[im 22/34  brain]
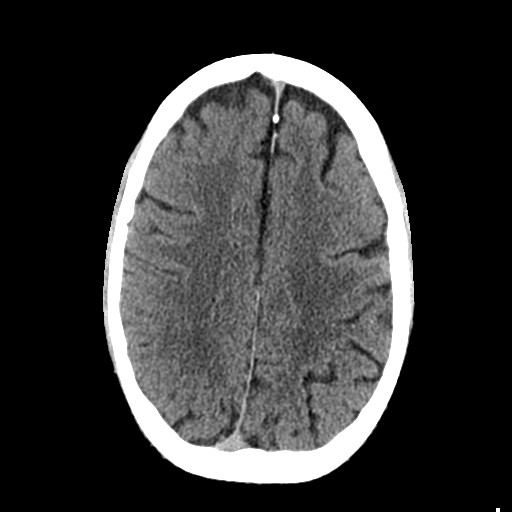
[im 26/34  brain]
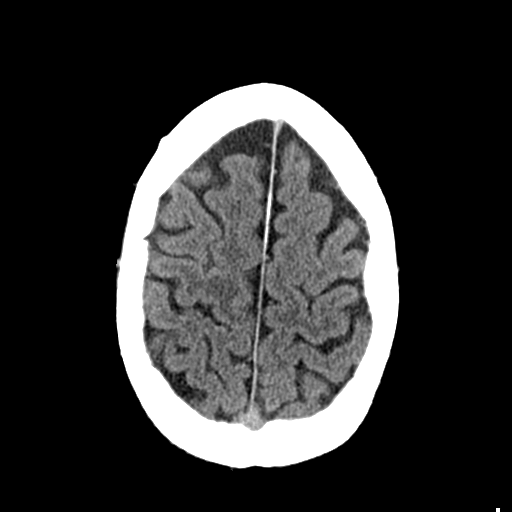
[im 28/34  brain]
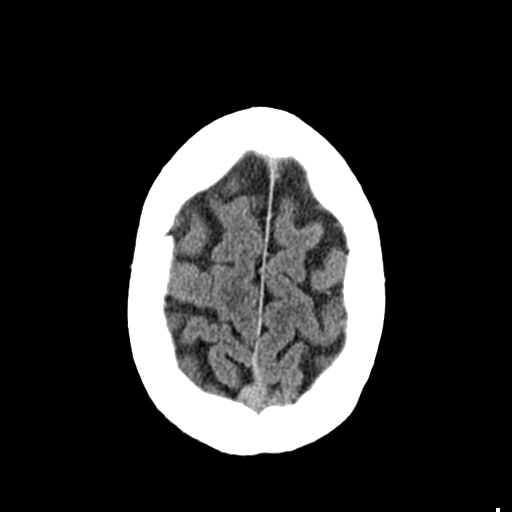
[im 28/34  bone]
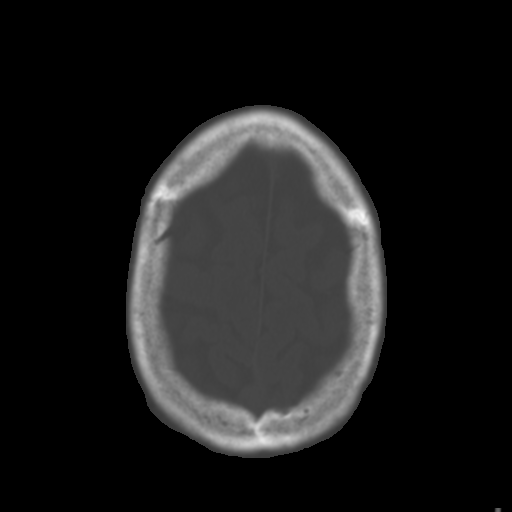
[im 31/34  brain]
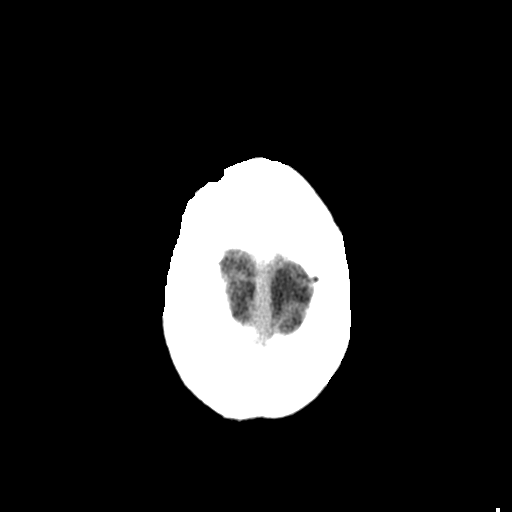

[Series 5: coronal soft tissue · coronal · 0.36mm/px · 3 of 77 slices shown]
[im 30/77  brain]
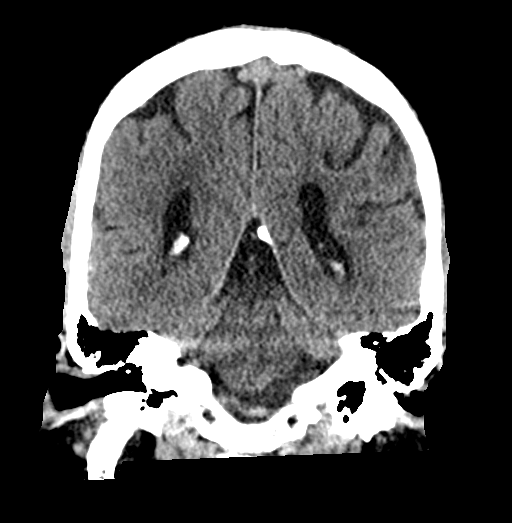
[im 36/77  brain]
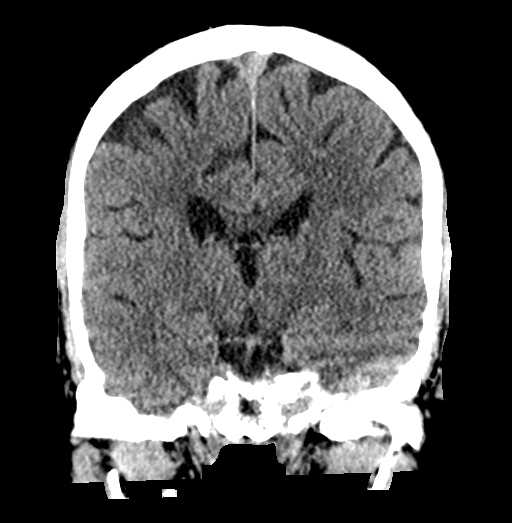
[im 42/77  brain]
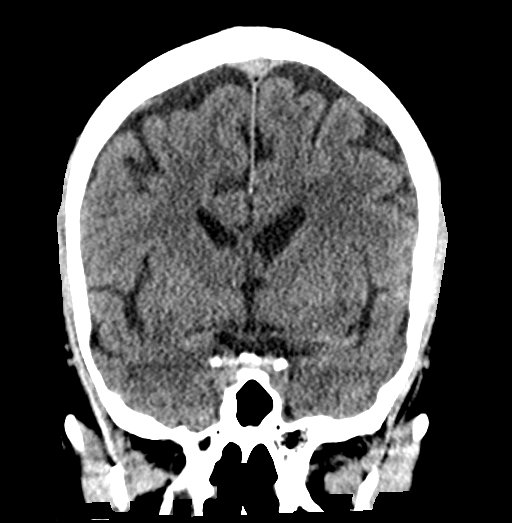

[Series 6: sagittal soft tissue · sagittal · 0.37mm/px · 3 of 55 slices shown]
[im 19/55  brain]
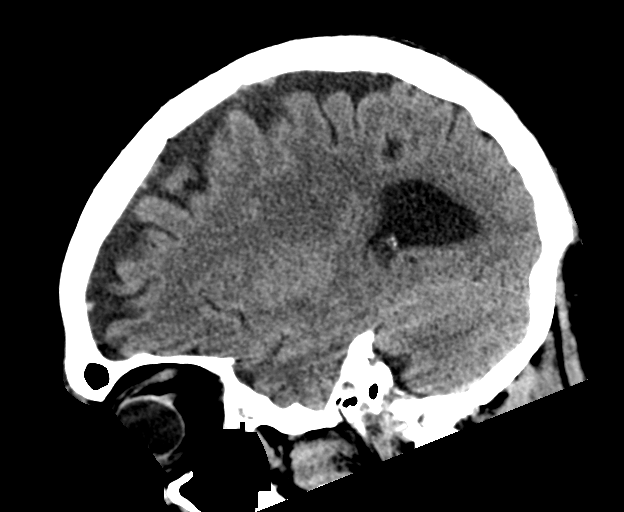
[im 28/55  brain]
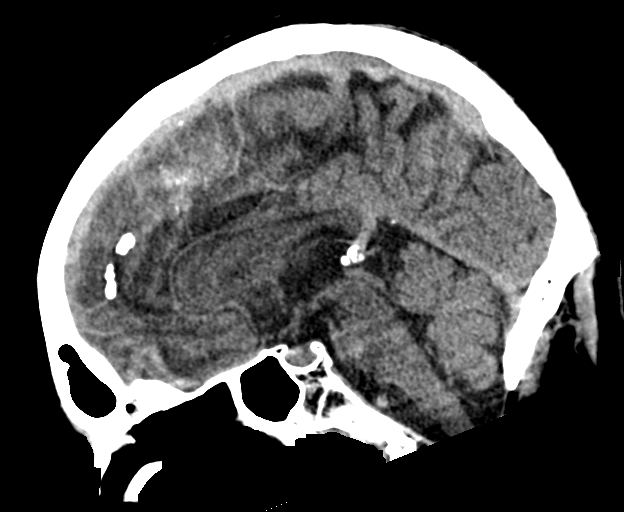
[im 37/55  brain]
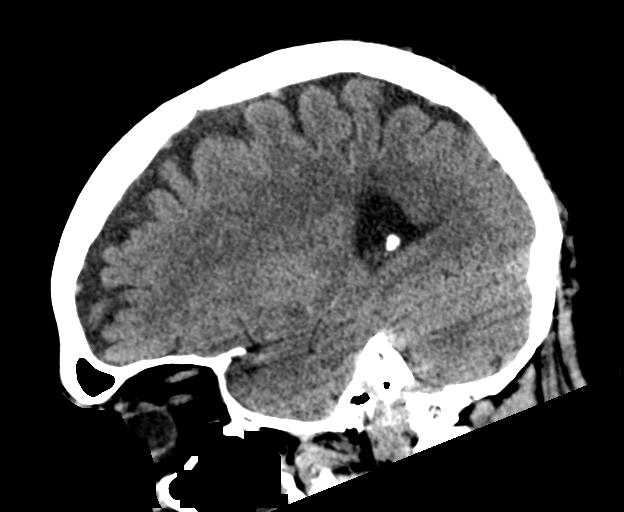

[16 of 47 positions shown; findings below may reference images not displayed]

FINDINGS: Brain: No evidence of acute infarction, hemorrhage, hydrocephalus,
extra-axial collection or mass lesion/mass effect. Mild atrophy.
Minimal small vessel ischemic change of the white matter

Vascular: No hyperdense vessels.  No unexpected calcification

Skull: Normal. Negative for fracture or focal lesion.

Sinuses/Orbits: Moderate mucosal thickening in the right maxillary
sinus with mild mucosal thickening in the ethmoid, sphenoid and
frontal sinuses. No acute orbital abnormality

Other: None
IMPRESSION: 1. No CT evidence for acute intracranial abnormality.
2. Sinusitis

## 2017-05-09 MED ORDER — SODIUM CHLORIDE 0.9 % IV BOLUS (SEPSIS): 1000.0000 mL | Freq: Once | INTRAVENOUS | Status: DC

## 2017-05-09 MED ORDER — ONDANSETRON HCL 4 MG PO TABS: 4.0000 mg | ORAL_TABLET | Freq: Four times a day (QID) | ORAL | Status: DC | PRN

## 2017-05-09 MED ORDER — FOLIC ACID 1 MG PO TABS
1.0000 mg | ORAL_TABLET | Freq: Every day | ORAL | Status: DC
  Administered 2017-05-09 – 2017-05-13 (×5): 1 mg via ORAL
  Filled 2017-05-09 (×5): qty 1

## 2017-05-09 MED ORDER — CEFTRIAXONE SODIUM 1 G IJ SOLR
1.0000 g | Freq: Once | INTRAMUSCULAR | Status: AC
  Administered 2017-05-09: 1 g via INTRAVENOUS
  Filled 2017-05-09: qty 10

## 2017-05-09 MED ORDER — ACETAMINOPHEN 650 MG RE SUPP: 650.0000 mg | Freq: Four times a day (QID) | RECTAL | Status: DC | PRN

## 2017-05-09 MED ORDER — GUAIFENESIN ER 600 MG PO TB12
600.0000 mg | ORAL_TABLET | Freq: Two times a day (BID) | ORAL | Status: DC
  Administered 2017-05-09 – 2017-05-13 (×8): 600 mg via ORAL
  Filled 2017-05-09 (×8): qty 1

## 2017-05-09 MED ORDER — VITAMIN B-1 100 MG PO TABS
100.0000 mg | ORAL_TABLET | Freq: Every day | ORAL | Status: DC
  Administered 2017-05-09 – 2017-05-13 (×5): 100 mg via ORAL
  Filled 2017-05-09 (×5): qty 1

## 2017-05-09 MED ORDER — DIAZEPAM 2 MG PO TABS
2.0000 mg | ORAL_TABLET | Freq: Three times a day (TID) | ORAL | Status: AC
  Administered 2017-05-09 – 2017-05-11 (×5): 2 mg via ORAL
  Filled 2017-05-09 (×5): qty 1

## 2017-05-09 MED ORDER — SODIUM CHLORIDE 0.9 % IV BOLUS (SEPSIS)
1000.0000 mL | Freq: Once | INTRAVENOUS | Status: AC
  Administered 2017-05-09: 1000 mL via INTRAVENOUS

## 2017-05-09 MED ORDER — DEXTROSE 5 % IV SOLN
500.0000 mg | INTRAVENOUS | Status: DC
  Administered 2017-05-10: 500 mg via INTRAVENOUS
  Filled 2017-05-09: qty 500

## 2017-05-09 MED ORDER — ACETAMINOPHEN 325 MG PO TABS
650.0000 mg | ORAL_TABLET | Freq: Once | ORAL | Status: AC
  Administered 2017-05-09: 650 mg via ORAL
  Filled 2017-05-09: qty 2

## 2017-05-09 MED ORDER — DEXTROSE 5 % IV SOLN
1.0000 g | INTRAVENOUS | Status: DC
  Administered 2017-05-10: 1 g via INTRAVENOUS
  Filled 2017-05-09: qty 10

## 2017-05-09 MED ORDER — IPRATROPIUM-ALBUTEROL 0.5-2.5 (3) MG/3ML IN SOLN: 3.0000 mL | Freq: Four times a day (QID) | RESPIRATORY_TRACT | Status: DC

## 2017-05-09 MED ORDER — SODIUM CHLORIDE 0.9% FLUSH
3.0000 mL | Freq: Two times a day (BID) | INTRAVENOUS | Status: DC
  Administered 2017-05-09 – 2017-05-12 (×5): 3 mL via INTRAVENOUS

## 2017-05-09 MED ORDER — SODIUM CHLORIDE 0.9 % IV SOLN
INTRAVENOUS | Status: DC
  Administered 2017-05-09 – 2017-05-13 (×7): via INTRAVENOUS

## 2017-05-09 MED ORDER — IPRATROPIUM-ALBUTEROL 0.5-2.5 (3) MG/3ML IN SOLN
3.0000 mL | Freq: Four times a day (QID) | RESPIRATORY_TRACT | Status: DC
  Administered 2017-05-09 – 2017-05-10 (×3): 3 mL via RESPIRATORY_TRACT
  Filled 2017-05-09 (×3): qty 3

## 2017-05-09 MED ORDER — THIAMINE HCL 100 MG/ML IJ SOLN
Freq: Once | INTRAVENOUS | Status: AC
  Administered 2017-05-09: 21:00:00 via INTRAVENOUS
  Filled 2017-05-09: qty 1000

## 2017-05-09 MED ORDER — GUAIFENESIN 100 MG/5ML PO SOLN
20.0000 mL | Freq: Four times a day (QID) | ORAL | Status: DC | PRN
  Filled 2017-05-09: qty 20

## 2017-05-09 MED ORDER — SODIUM CHLORIDE 0.9 % IV SOLN: 250.0000 mL | INTRAVENOUS | Status: DC | PRN

## 2017-05-09 MED ORDER — ALBUTEROL SULFATE (2.5 MG/3ML) 0.083% IN NEBU: 2.5000 mg | INHALATION_SOLUTION | RESPIRATORY_TRACT | Status: DC | PRN

## 2017-05-09 MED ORDER — SENNA 8.6 MG PO TABS
1.0000 | ORAL_TABLET | Freq: Two times a day (BID) | ORAL | Status: DC
  Administered 2017-05-09 – 2017-05-13 (×8): 8.6 mg via ORAL
  Filled 2017-05-09 (×8): qty 1

## 2017-05-09 MED ORDER — LORAZEPAM 2 MG/ML IJ SOLN
1.0000 mg | INTRAMUSCULAR | Status: DC | PRN
  Administered 2017-05-12: 1 mg via INTRAVENOUS
  Filled 2017-05-09: qty 1

## 2017-05-09 MED ORDER — POLYETHYLENE GLYCOL 3350 17 G PO PACK
17.0000 g | PACK | Freq: Every day | ORAL | Status: DC | PRN
Start: 2017-05-09 — End: 2017-05-13

## 2017-05-09 MED ORDER — HEPARIN SODIUM (PORCINE) 5000 UNIT/ML IJ SOLN
5000.0000 [IU] | Freq: Three times a day (TID) | INTRAMUSCULAR | Status: DC
  Administered 2017-05-09 – 2017-05-13 (×10): 5000 [IU] via SUBCUTANEOUS
  Filled 2017-05-09 (×11): qty 1

## 2017-05-09 MED ORDER — ACETAMINOPHEN 325 MG PO TABS: 650.0000 mg | ORAL_TABLET | Freq: Four times a day (QID) | ORAL | Status: DC | PRN

## 2017-05-09 MED ORDER — SODIUM CHLORIDE 0.9% FLUSH: 3.0000 mL | INTRAVENOUS | Status: DC | PRN

## 2017-05-09 MED ORDER — OSELTAMIVIR PHOSPHATE 75 MG PO CAPS
75.0000 mg | ORAL_CAPSULE | Freq: Two times a day (BID) | ORAL | Status: DC
  Administered 2017-05-09 – 2017-05-13 (×8): 75 mg via ORAL
  Filled 2017-05-09 (×8): qty 1

## 2017-05-09 MED ORDER — ONDANSETRON HCL 4 MG/2ML IJ SOLN: 4.0000 mg | Freq: Four times a day (QID) | INTRAMUSCULAR | Status: DC | PRN

## 2017-05-09 MED ORDER — TRAZODONE HCL 50 MG PO TABS: 50.0000 mg | ORAL_TABLET | Freq: Every evening | ORAL | Status: DC | PRN

## 2017-05-09 MED ORDER — DEXTROSE 5 % IV SOLN
500.0000 mg | Freq: Once | INTRAVENOUS | Status: AC
  Administered 2017-05-09: 500 mg via INTRAVENOUS
  Filled 2017-05-09: qty 500

## 2017-05-09 NOTE — ED Notes (Signed)
ED TO INPATIENT HANDOFF REPORT  Name/Age/Gender Christopher Dawson 61 y.o. male  Code Status Code Status History    This patient does not have a recorded code status. Please follow your organizational policy for patients in this situation.      Home/SNF/Other Home  Chief Complaint Near Syncopy  Level of Care/Admitting Diagnosis ED Disposition    ED Disposition Condition Comment   Admit  Hospital Area: [name] [[id number]]  Level of Care: Med-Surg [16]  Diagnosis: Pneumonia [371062]  Admitting Physician: Morrison Old  Attending Physician: Morrison Old  Estimated length of stay: 3 - 4 days  Certification:: I certify this patient will need inpatient services for at least 2 midnights  Bed request comments: med  PT Class (Do Not Modify): Inpatient [101]  PT Acc Code (Do Not Modify): Private [1]       Medical History Past Medical History:  Diagnosis Date  . Anxiety   . Depression   . Lumbar radiculopathy, chronic 2008  . Marfan syndrome   . Neurosis, depressive   . Shortening, leg, congenital     Allergies Allergies  Allergen Reactions  . Codeine Other (See Comments)    "spaced out"    IV Location/Drains/Wounds Patient Lines/Drains/Airways Status   Active Line/Drains/Airways    Name:   Placement date:   Placement time:   Site:   Days:   Peripheral IV 05/09/17 Left Forearm   05/09/17    1928    Forearm   less than 1   Peripheral IV 05/09/17 Right Forearm   05/09/17    2015    Forearm   less than 1          Labs/Imaging Results for orders placed or performed during the hospital encounter of 05/09/17 (from the past 48 hour(s))  CBG monitoring, ED     Status: Abnormal   Collection Time: 05/09/17  1:42 PM  Result Value Ref Range   Glucose-Capillary 130 (H) 65 - 99 mg/dL  CBC WITH DIFFERENTIAL     Status: Abnormal   Collection Time: 05/09/17  3:06 PM  Result Value Ref Range   WBC 3.7 (L) 4.0 - 10.5 K/uL   RBC 4.95  4.22 - 5.81 MIL/uL   Hemoglobin 15.5 13.0 - 17.0 g/dL   HCT 45.4 39.0 - 52.0 %   MCV 91.7 78.0 - 100.0 fL   MCH 31.3 26.0 - 34.0 pg   MCHC 34.1 30.0 - 36.0 g/dL   RDW 12.8 11.5 - 15.5 %   Platelets 112 (L) 150 - 400 K/uL    Comment: SPECIMEN CHECKED FOR CLOTS PLATELET COUNT CONFIRMED BY SMEAR    Neutrophils Relative % 79 %   Lymphocytes Relative 12 %   Monocytes Relative 9 %   Eosinophils Relative 0 %   Basophils Relative 0 %   Neutro Abs 3.0 1.7 - 7.7 K/uL   Lymphs Abs 0.4 (L) 0.7 - 4.0 K/uL   Monocytes Absolute 0.3 0.1 - 1.0 K/uL   Eosinophils Absolute 0.0 0.0 - 0.7 K/uL   Basophils Absolute 0.0 0.0 - 0.1 K/uL   Smear Review MORPHOLOGY UNREMARKABLE   Basic metabolic panel     Status: Abnormal   Collection Time: 05/09/17  3:06 PM  Result Value Ref Range   Sodium 132 (L) 135 - 145 mmol/L   Potassium 3.6 3.5 - 5.1 mmol/L   Chloride 99 (L) 101 - 111 mmol/L   CO2 24 22 - 32 mmol/L   Glucose, Bld  112 (H) 65 - 99 mg/dL   BUN 23 (H) 6 - 20 mg/dL   Creatinine, Ser 0.96 0.61 - 1.24 mg/dL   Calcium 7.9 (L) 8.9 - 10.3 mg/dL   GFR calc non Af Amer >60 >60 mL/min   GFR calc Af Amer >60 >60 mL/min    Comment: (NOTE) The eGFR has been calculated using the CKD EPI equation. This calculation has not been validated in all clinical situations. eGFR's persistently <60 mL/min signify possible Chronic Kidney Disease.    Anion gap 9 5 - 15  Magnesium     Status: Abnormal   Collection Time: 05/09/17  3:06 PM  Result Value Ref Range   Magnesium 2.6 (H) 1.7 - 2.4 mg/dL  Ethanol     Status: None   Collection Time: 05/09/17  3:07 PM  Result Value Ref Range   Alcohol, Ethyl (B) <10 <10 mg/dL    Comment:        LOWEST DETECTABLE LIMIT FOR SERUM ALCOHOL IS 10 mg/dL FOR MEDICAL PURPOSES ONLY   I-Stat Troponin, ED (not at [facility])     Status: None   Collection Time: 05/09/17  3:15 PM  Result Value Ref Range   Troponin i, poc 0.02 0.00 - 0.08 ng/mL   Comment 3            Comment: Due to the  release kinetics of cTnI, a negative result within the first hours of the onset of symptoms does not rule out myocardial infarction with certainty. If myocardial infarction is still suspected, repeat the test at appropriate intervals.   I-Stat CG4 Lactic Acid, ED  (not at  [facility])     Status: None   Collection Time: 05/09/17  3:18 PM  Result Value Ref Range   Lactic Acid, Venous 0.84 0.5 - 1.9 mmol/L  Influenza panel by PCR (type A & B)     Status: Abnormal   Collection Time: 05/09/17  5:07 PM  Result Value Ref Range   Influenza A By PCR POSITIVE (A) NEGATIVE   Influenza B By PCR NEGATIVE NEGATIVE    Comment: (NOTE) The Xpert Xpress Flu assay is intended as an aid in the diagnosis of  influenza and should not be used as a sole basis for treatment.  This  assay is FDA approved for nasopharyngeal swab specimens only. Nasal  washings and aspirates are unacceptable for Xpert Xpress Flu testing.    Dg Chest 2 View  Result Date: 05/09/2017 CLINICAL DATA:  Dizziness and cough EXAM: CHEST  2 VIEW COMPARISON:  None. FINDINGS: There is airspace consolidation throughout portions of the right upper and right middle lobe regions. There is mild atelectatic change in the left base. Left lung is otherwise clear. Heart is upper normal in size with pulmonary vascularity within normal limits. No adenopathy. No bone lesions. IMPRESSION: Areas of airspace consolidation consistent with pneumonia in the right upper and right middle lobes. Mild left base atelectasis. Heart size within normal limits. Followup PA and lateral chest radiographs recommended in 3-4 weeks following trial of antibiotic therapy to ensure resolution and exclude underlying malignancy. Electronically Signed   By: Lowella Grip III M.D.   On: 05/09/2017 14:49   Ct Head Wo Contrast  Result Date: 05/09/2017 CLINICAL DATA:  Dizziness with generalize weakness EXAM: CT HEAD WITHOUT CONTRAST TECHNIQUE: Contiguous axial images were obtained from  the base of the skull through the vertex without intravenous contrast. COMPARISON:  None. FINDINGS: Brain: No evidence of acute infarction, hemorrhage, hydrocephalus,  extra-axial collection or mass lesion/mass effect. Mild atrophy. Minimal small vessel ischemic change of the white matter Vascular: No hyperdense vessels.  No unexpected calcification Skull: Normal. Negative for fracture or focal lesion. Sinuses/Orbits: Moderate mucosal thickening in the right maxillary sinus with mild mucosal thickening in the ethmoid, sphenoid and frontal sinuses. No acute orbital abnormality Other: None IMPRESSION: 1. No CT evidence for acute intracranial abnormality. 2. Sinusitis Electronically Signed   By: Donavan Foil M.D.   On: 05/09/2017 18:30    Pending Labs Unresulted Labs (From admission, onward)   Start     Ordered   05/09/17 1948  Legionella Pneumophila Serogp 1 Ur Ag  Once,   R     05/09/17 1947   05/09/17 1948  Strep pneumoniae urinary antigen  Once,   R     05/09/17 1947   05/09/17 1948  Procalcitonin - Baseline  STAT,   STAT     05/09/17 1947   05/09/17 1646  Urine rapid drug screen (hosp performed)  STAT,   R     05/09/17 1645   05/09/17 1637  Culture, blood (Routine X 2) w Reflex to ID Panel  BLOOD CULTURE X 2,   R    Comments:  Blood cultures x 2 from peripheral line   Question:  Patient immune status  Answer:  Normal   05/09/17 1636   05/09/17 1401  Rapid urine drug screen (hospital performed)  STAT,   R     05/09/17 1400   05/09/17 1359  Urinalysis, Complete w Microscopic  STAT,   STAT     05/09/17 1400   Signed and Held  HIV antibody (Routine Testing)  Once,   R     Signed and Held   Signed and Held  Basic metabolic panel  Tomorrow morning,   R     Signed and Held   Signed and Held  CBC  Tomorrow morning,   R     Signed and Held      Vitals/Pain Today's Vitals   05/09/17 1645 05/09/17 1901 05/09/17 1915 05/09/17 2012  BP: 116/72 137/77 (!) 145/86   Pulse: 95 83 81   Resp: 18  18 (!) 25   Temp:  98 F (36.7 C)    TempSrc:  Oral    SpO2: 92% 93% 95%   Weight:    175 lb (79.4 kg)  Height:    6' 3"  (1.905 m)    Isolation Precautions Droplet precaution  Medications Medications  sodium chloride 0.9 % bolus 1,000 mL (0 mLs Intravenous Hold 05/09/17 1849)  azithromycin (ZITHROMAX) 500 mg in dextrose 5 % 250 mL IVPB (not administered)  cefTRIAXone (ROCEPHIN) 1 g in dextrose 5 % 50 mL IVPB (not administered)  guaiFENesin (MUCINEX) 12 hr tablet 600 mg (not administered)  guaiFENesin (ROBITUSSIN) 100 MG/5ML solution 400 mg (not administered)  ipratropium-albuterol (DUONEB) 0.5-2.5 (3) MG/3ML nebulizer solution 3 mL (3 mLs Nebulization Given 05/09/17 2032)  oseltamivir (TAMIFLU) capsule 75 mg (not administered)  thiamine (VITAMIN B-1) tablet 100 mg (100 mg Oral Not Given 7/34/19 3790)  folic acid (FOLVITE) tablet 1 mg (not administered)  LORazepam (ATIVAN) injection 1 mg (not administered)  diazepam (VALIUM) tablet 2 mg (not administered)  sodium chloride 0.9 % bolus 1,000 mL (0 mLs Intravenous Stopped 05/09/17 1744)  cefTRIAXone (ROCEPHIN) 1 g in dextrose 5 % 50 mL IVPB (0 g Intravenous Stopped 05/09/17 1923)  azithromycin (ZITHROMAX) 500 mg in dextrose 5 % 250 mL IVPB (0 mg  Intravenous Stopped 05/09/17 2012)  sodium chloride 0.9 % 1,000 mL with thiamine 826 mg, folic acid 1 mg, multivitamins adult 10 mL infusion ( Intravenous New Bag/Given 05/09/17 2012)  acetaminophen (TYLENOL) tablet 650 mg (650 mg Oral Given 05/09/17 1704)    Mobility non-ambulatory due to ETOH. Patient tries to walk and is about to fall. Patient is a fall risk

## 2017-05-09 NOTE — Progress Notes (Signed)
[facility] Evaluation note  Mr. Christopher Dawson has been evaluated for Flu A IGIV study. He has met pre-screening criteria and has been pre-consented. He is willing to participate in the study if formal screening criteria are met. He will review informed consent. Dr. Alvino Chapel team will follow up with Mr. Christopher Dawson tomorrow 1/17.  Corey Harold AGACNP-BC Sub-Investigator [facility] 05/09/2017 8:07 PM

## 2017-05-09 NOTE — ED Notes (Signed)
Pt ambulated and O2 was at 96%.  However, pt was unsteady and almost fell when turning around to go back to room.  Pt states that he has a cane at home that he walks with but is unable to find it.

## 2017-05-09 NOTE — ED Provider Notes (Signed)
[facility] DEPT Provider Note   CSN: [id number] Arrival date & time: 05/09/17  1308     History   Chief Complaint Chief Complaint  Patient presents with  . Near Syncopy  . Weakness    HPI Christopher Dawson is a 62 y.o. male with history of Marfan syndrome, depression, anxiety presents for evaluation of dizziness, generalized weakness, cough for 4 days. Felt dizzy and almost passed out while walking today to catch a cab, states taxi driver called EMS.  He remembers feeling dizzy, falling back onto his car but did not fall to the ground. No head trauma, LOC or actual fall. Reports gradually worsening nonproductive cough for the last 4 days, thinks he has the flu. Dizziness occurs while walking, alleviated with sitting down and rest and goes away in a few seconds. He denies associated chest pain, shortness of breath, nausea, vomiting, palpitations, headache, vision changes. States he has not been drinking a lot of fluids in the last to 3 days. Only had a bottle of Sprite in the last 2 days. Note, EMS reported that the taxi was picking a patient from [facility] store.  HPI  Past Medical History:  Diagnosis Date  . Anxiety   . Depression   . Lumbar radiculopathy, chronic 2008  . Marfan syndrome   . Neurosis, depressive   . Shortening, leg, congenital     Patient Active Problem List   Diagnosis Date Noted  . 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  . Abnormality of gait 04/12/2013  . 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 Tobacco use disorder 07/04/2012  . Anxiety state 07/04/2012  . Depression 07/04/2012    Past Surgical History:  Procedure Laterality Date  . LEG SURGERY         Home Medications    Prior to Admission medications   Medication Sig Start Date End Date Taking? Authorizing Provider  clomiPRAMINE (ANAFRANIL) 50 MG capsule TAKE 2 CAPSULES BY MOUTH AT BEDTIME 12/28/16  Yes Shawnee Knapp, MD  cephALEXin (KEFLEX) 500 MG capsule Take 1 capsule (500 mg total) by  mouth 3 (three) times daily. Patient not taking: Reported on 05/09/2017 08/10/16   Rolland Porter, MD  LORazepam (ATIVAN) 1 MG tablet TAKE 1/2 TO 1 TABLET BY MOUTH EVERY 8 HOURS AS NEEDED Patient not taking: Reported on 05/09/2017 05/07/17   Shawnee Knapp, MD    Family History Family History  Problem Relation Age of Onset  . Heart disease Mother     Social History Social History   Tobacco Use  . Smoking status: Current Every Day Smoker  . Smokeless tobacco: Never Used  Substance Use Topics  . Alcohol use: Yes  . Drug use: No     Allergies   Codeine   Review of Systems Review of Systems  Constitutional: Positive for appetite change (decreased fluid intake) and fatigue.  Respiratory: Positive for cough.   Neurological: Positive for dizziness.  All other systems reviewed and are negative.    Physical Exam Updated Vital Signs BP 132/78 (BP Location: Right Arm)   Pulse 98   Temp (!) 101.9 F (38.8 C) (Rectal)   Resp 20   SpO2 95%   Physical Exam  Constitutional: He is oriented to person, place, and time. He appears well-developed and well-nourished. No distress.  Non toxic.  HENT:  Head: Normocephalic and atraumatic.  Nose: Nose normal.  Mouth/Throat: Mucous membranes are dry. No oropharyngeal exudate.  Dry mucous membranes and lips  Eyes: Conjunctivae and EOM are normal. Pupils  are equal, round, and reactive to light.  Neck: Normal range of motion.  Cardiovascular: Regular rhythm, normal heart sounds and intact distal pulses. Tachycardia present.  No murmur heard. HR 108. 2+ DP and radial pulses bilaterally. No LE edema.   Pulmonary/Chest: Effort normal and breath sounds normal. Tachypnea noted.  RR 24. Lungs clear to auscultation bilaterally.  Abdominal: Soft. Bowel sounds are normal. There is no tenderness.  No G/R/R. No suprapubic or CVA tenderness.   Musculoskeletal: Normal range of motion. He exhibits no deformity.  Neurological: He is alert and oriented to  person, place, and time.  AxO x4. Sensation and strength intact in upper and lower extremities. PERRL and EOMs intact bilaterally. No nystagmus. Negative test of skew. No dysarthria, diplopia, dysphagia.   Skin: Skin is warm and dry. Capillary refill takes less than 2 seconds.  Psychiatric: He has a normal mood and affect. His behavior is normal. Judgment and thought content normal.  Nursing note and vitals reviewed.    ED Treatments / Results  Labs (all labs ordered are listed, but only abnormal results are displayed) Labs Reviewed  CBC WITH DIFFERENTIAL/PLATELET - Abnormal; Notable for the following components:      Result Value   WBC 3.7 (*)    All other components within normal limits  BASIC METABOLIC PANEL - Abnormal; Notable for the following components:   Sodium 132 (*)    Chloride 99 (*)    Glucose, Bld 112 (*)    BUN 23 (*)    Calcium 7.9 (*)    All other components within normal limits  MAGNESIUM - Abnormal; Notable for the following components:   Magnesium 2.6 (*)    All other components within normal limits  CBG MONITORING, ED - Abnormal; Notable for the following components:   Glucose-Capillary 130 (*)    All other components within normal limits  RESPIRATORY PANEL BY PCR  ETHANOL  URINALYSIS, COMPLETE (UACMP) WITH MICROSCOPIC  RAPID URINE DRUG SCREEN, HOSP PERFORMED  I-STAT CG4 LACTIC ACID, ED  I-STAT TROPONIN, ED  I-STAT CG4 LACTIC ACID, ED    EKG  EKG Interpretation  Date/Time:  Wednesday May 09 2017 13:40:59 EST Ventricular Rate:  96 PR Interval:    QRS Duration: 114 QT Interval:  357 QTC Calculation: 452 R Axis:   -36 Text Interpretation:  Sinus rhythm Ventricular premature complex Probable left atrial enlargement Incomplete RBBB and LAFB No previous ECGs available Confirmed by Gareth Morgan [phone]) on 05/09/2017 2:31:28 PM       Radiology Dg Chest 2 View  Result Date: 05/09/2017 CLINICAL DATA:  Dizziness and cough EXAM: CHEST  2 VIEW  COMPARISON:  None. FINDINGS: There is airspace consolidation throughout portions of the right upper and right middle lobe regions. There is mild atelectatic change in the left base. Left lung is otherwise clear. Heart is upper normal in size with pulmonary vascularity within normal limits. No adenopathy. No bone lesions. IMPRESSION: Areas of airspace consolidation consistent with pneumonia in the right upper and right middle lobes. Mild left base atelectasis. Heart size within normal limits. Followup PA and lateral chest radiographs recommended in 3-4 weeks following trial of antibiotic therapy to ensure resolution and exclude underlying malignancy. Electronically Signed   By: Lowella Grip III M.D.   On: 05/09/2017 14:49    Procedures Procedures (including critical care time)  Medications Ordered in ED Medications  sodium chloride 0.9 % bolus 1,000 mL (not administered)  cefTRIAXone (ROCEPHIN) 1 g in dextrose 5 % 50  mL IVPB (not administered)  azithromycin (ZITHROMAX) 500 mg in dextrose 5 % 250 mL IVPB (not administered)  sodium chloride 0.9 % 1,000 mL with thiamine 496 mg, folic acid 1 mg, multivitamins adult 10 mL infusion (not administered)  acetaminophen (TYLENOL) tablet 650 mg (not administered)  sodium chloride 0.9 % bolus 1,000 mL (1,000 mLs Intravenous New Bag/Given 05/09/17 1512)     Initial Impression / Assessment and Plan / ED Course  I have reviewed the triage vital signs and the nursing notes.  Pertinent labs & imaging results that were available during my care of the patient were reviewed by me and considered in my medical decision making (see chart for details).  Clinical Course as of May 09 1613  Wed May 09, 2017  1513 IMPRESSION: Areas of airspace consolidation consistent with pneumonia in the right upper and right middle lobes. Mild left base atelectasis. Heart size within normal limits.  Followup PA and lateral chest radiographs recommended in 3-4 weeks following  trial of antibiotic therapy to ensure resolution and exclude underlying malignancy. DG Chest 2 View [CG]    Clinical Course User Index [CG] Kinnie Feil, PA-C   62 year old male presents for presyncopal episode in setting of worsening cough x 4 days. Admits to drinking at least 4-6 cans of beer daily. Has not been drinking fluids otherwise in the last 2 days. Last drink 3-4 days ago. No h/o withdrawal symptoms.   On exam, patient is dry appearing. Initial VS unremarkable. However, rectal temp 101.9 and on my exam tachycardic and tachypnic which improved after 1L IVF. Abnormal lung sounds on the right. Concern for influenza, community acquired pneumonia, aspiration pneumonia, orthostatic hypotension in setting of viral URI and ETOH abuse. We'll initiate lab work and reassess.  Final Clinical Impressions(s) / ED Diagnoses   Ride and-ray revealed pneumonia in the right upper/middle lobes. No leukocytosis. Lactic acid normal. Patient has received 1 L IV fluids. We will give additional 1 L IV fluids, banana bag, antibiotics. Patient ambulated with 95% oxygen saturation however reported new shortness of breath, dizziness and required more assistance than at baseline. Given underlying EtOH abuse, dehydration think patient will benefit from admission. Pending U/A, UDS, respiratory panel .  Final diagnoses:  Community acquired pneumonia of right middle lobe of lung [facility])    ED Discharge Orders    None       Kinnie Feil, PA-C 05/09/17 1615    Gareth Morgan, MD 05/10/17 [phone]

## 2017-05-09 NOTE — H&P (Signed)
Patient Demographics:    Christopher Dawson, is a 62 y.o. male  MRN: [id number]   DOB - 1956/02/15  Admit Date - 05/09/2017  Outpatient Primary MD for the patient is Shawnee Knapp, MD   Assessment & Plan:    Principal Problem:   Pneumonia Active Problems:   Tobacco use disorder   Influenza A   ETOH abuse    1)Rt Sided PNA/CAP-patient with fever and leukopenia, treat empirically with IV Rocephin/azithromycin, bronchodilators and mucolytics as ordered, no significant hypoxia at this time.  Smoking cessation strongly advised  2)influenza A-patient admits he did not get a flu shot, treat empirically with Tamiflu per protocol, respiratory/droplet precautions, fever leukopenia may be from influenza  3)Etoh Abuse-he is reluctant to give a urine sample for drug screen, he admits to drinking 6-8 beers a day, high risk for delirium tremens, Valium 2 mg 3 times daily for 2 days, lorazepam as needed per CIWA protocol, folic acid and thiamine as ordered  4)Syncope- ????  If due to dehydration in the setting of influenza A and right-sided pneumonia, check urine drug screen , place on telemetry monitored unit, watch for arrhythmias, check serial troponins and EKG for rule out ACS protocol, check echocardiogram to rule out significant aortic stenosis or other outflow obstruction, and also to evaluate EF and to rule out segmental/Regional wall motion abnormalities. Check carotid artery Dopplers to rule out hemodynamically significant stenosis,    With History of - Reviewed by me  Past Medical History:  Diagnosis Date  . Anxiety   . Depression   . Lumbar radiculopathy, chronic 2008  . Marfan syndrome   . Neurosis, depressive   . Shortening, leg, congenital       Past Surgical History:  Procedure Laterality Date  . LEG SURGERY         Chief Complaint  Patient presents with  . Near Syncopy  . Weakness      HPI:    Christopher Dawson  is a 62 y.o. male with past medical history relevant for alcohol abuse and Marfan syndrome, depression, anxiety presents for evaluation of dizziness, generalized weakness, cough for 4 days. Felt dizzy and almost passed out while walking today to catch a cab, states taxi driver called EMS.  He remembers feeling dizzy, falling back onto his car but did not fall to the ground. No head trauma, LOC or actual fall. Reports gradually worsening nonproductive cough for the last 4 days, thinks he has the flu. Dizziness occurs while walking, alleviated with sitting down and rest and goes away in a few seconds. He denies associated chest pain, shortness of breath, nausea, vomiting, No CP, no palpitations, headache, vision change. EMS reported that the taxi was picking a patient from [facility] store to go buy alcohol when he kind of passed out in the cab. he admits to drinking 6-8 beers a day  In ED.... Influenza A test is positive, CBC with leukopenia, chest x-ray  with right-sided pneumonia, patient admits to cough but no hypoxia here in the ED.... CT head without Acute intracranial  findings, sinusitis noted,   No Vomiting or diarrhea  Despite Persuasion patient is reluctant to give urine sample for UDS, he actually peed into his water cup  with ice in it.     Review of systems:    In addition to the HPI above,   A full 12 point Review of 10 Systems was done, except as stated above, all other Review of 10 Systems were negative.    Social History:  Reviewed by me    Social History   Tobacco Use  . Smoking status: Current Every Day Smoker  . Smokeless tobacco: Never Used  Substance Use Topics  . Alcohol use: Yes     Family History :  Reviewed by me    Family History  Problem Relation Age of Onset  . Heart disease Mother      Home Medications:   Prior to Admission medications     Medication Sig Start Date End Date Taking? Authorizing Provider  clomiPRAMINE (ANAFRANIL) 50 MG capsule TAKE 2 CAPSULES BY MOUTH AT BEDTIME 12/28/16  Yes Shawnee Knapp, MD  cephALEXin (KEFLEX) 500 MG capsule Take 1 capsule (500 mg total) by mouth 3 (three) times daily. Patient not taking: Reported on 05/09/2017 08/10/16   Rolland Porter, MD  LORazepam (ATIVAN) 1 MG tablet TAKE 1/2 TO 1 TABLET BY MOUTH EVERY 8 HOURS AS NEEDED Patient not taking: Reported on 05/09/2017 05/07/17   Shawnee Knapp, MD     Allergies:     Allergies  Allergen Reactions  . Codeine Other (See Comments)    "spaced out"     Physical Exam:   Vitals  Blood pressure 137/77, pulse 83, temperature 98 F (36.7 C), temperature source Oral, resp. rate 18, SpO2 93 %.  Physical Examination: General appearance - alert, well appearing, and in no distress  Mental status - alert, oriented to person, place, and time,  Eyes - sclera anicteric Neck - supple, no JVD elevation , Chest -diminished on the right, scattered rhonchi  heart - S1 and S2 normal,  Abdomen - soft, nontender, nondistended, no CVA tenderness Neurological - screening mental status exam normal, neck supple without rigidity, cranial nerves II through XII intact, DTR's normal and symmetric Extremities - no pedal edema noted, intact peripheral pulses  Skin - warm, dry Psych-appropriate affect, cooperative   Data Review:    CBC Recent Labs  Lab 05/09/17 1506  WBC 3.7*  HGB 15.5  HCT 45.4  PLT 112*  MCV 91.7  MCH 31.3  MCHC 34.1  RDW 12.8  LYMPHSABS 0.4*  MONOABS 0.3  EOSABS 0.0  BASOSABS 0.0   ------------------------------------------------------------------------------------------------------------------  Chemistries  Recent Labs  Lab 05/09/17 1506  NA 132*  K 3.6  CL 99*  CO2 24  GLUCOSE 112*  BUN 23*  CREATININE 0.96  CALCIUM 7.9*  MG 2.6*    ------------------------------------------------------------------------------------------------------------------ CrCl cannot be calculated (Unknown ideal weight.). ------------------------------------------------------------------------------------------------------------------ No results for input(s): TSH, T4TOTAL, T3FREE, THYROIDAB in the last 72 hours.  Invalid input(s): FREET3   Coagulation profile No results for input(s): INR, PROTIME in the last 168 hours. ------------------------------------------------------------------------------------------------------------------- No results for input(s): DDIMER in the last 72 hours. -------------------------------------------------------------------------------------------------------------------  Cardiac Enzymes No results for input(s): CKMB, TROPONINI, MYOGLOBIN in the last 168 hours.  Invalid input(s): CK ------------------------------------------------------------------------------------------------------------------ No results found for: BNP   ---------------------------------------------------------------------------------------------------------------  Urinalysis    Component Value Date/Time  COLORURINE YELLOW 08/10/2016 0141   APPEARANCEUR CLOUDY (A) 08/10/2016 0141   LABSPEC 1.004 (L) 08/10/2016 0141   PHURINE 5.0 08/10/2016 0141   GLUCOSEU NEGATIVE 08/10/2016 0141   HGBUR MODERATE (A) 08/10/2016 0141   BILIRUBINUR NEGATIVE 08/10/2016 0141   KETONESUR NEGATIVE 08/10/2016 0141   PROTEINUR NEGATIVE 08/10/2016 0141   NITRITE POSITIVE (A) 08/10/2016 0141   LEUKOCYTESUR LARGE (A) 08/10/2016 0141    ----------------------------------------------------------------------------------------------------------------   Imaging Results:    Dg Chest 2 View  Result Date: 05/09/2017 CLINICAL DATA:  Dizziness and cough EXAM: CHEST  2 VIEW COMPARISON:  None. FINDINGS: There is airspace consolidation throughout portions of the  right upper and right middle lobe regions. There is mild atelectatic change in the left base. Left lung is otherwise clear. Heart is upper normal in size with pulmonary vascularity within normal limits. No adenopathy. No bone lesions. IMPRESSION: Areas of airspace consolidation consistent with pneumonia in the right upper and right middle lobes. Mild left base atelectasis. Heart size within normal limits. Followup PA and lateral chest radiographs recommended in 3-4 weeks following trial of antibiotic therapy to ensure resolution and exclude underlying malignancy. Electronically Signed   By: Lowella Grip III M.D.   On: 05/09/2017 14:49   Ct Head Wo Contrast  Result Date: 05/09/2017 CLINICAL DATA:  Dizziness with generalize weakness EXAM: CT HEAD WITHOUT CONTRAST TECHNIQUE: Contiguous axial images were obtained from the base of the skull through the vertex without intravenous contrast. COMPARISON:  None. FINDINGS: Brain: No evidence of acute infarction, hemorrhage, hydrocephalus, extra-axial collection or mass lesion/mass effect. Mild atrophy. Minimal small vessel ischemic change of the white matter Vascular: No hyperdense vessels.  No unexpected calcification Skull: Normal. Negative for fracture or focal lesion. Sinuses/Orbits: Moderate mucosal thickening in the right maxillary sinus with mild mucosal thickening in the ethmoid, sphenoid and frontal sinuses. No acute orbital abnormality Other: None IMPRESSION: 1. No CT evidence for acute intracranial abnormality. 2. Sinusitis Electronically Signed   By: Donavan Foil M.D.   On: 05/09/2017 18:30    Radiological Exams on Admission: Dg Chest 2 View  Result Date: 05/09/2017 CLINICAL DATA:  Dizziness and cough EXAM: CHEST  2 VIEW COMPARISON:  None. FINDINGS: There is airspace consolidation throughout portions of the right upper and right middle lobe regions. There is mild atelectatic change in the left base. Left lung is otherwise clear. Heart is upper  normal in size with pulmonary vascularity within normal limits. No adenopathy. No bone lesions. IMPRESSION: Areas of airspace consolidation consistent with pneumonia in the right upper and right middle lobes. Mild left base atelectasis. Heart size within normal limits. Followup PA and lateral chest radiographs recommended in 3-4 weeks following trial of antibiotic therapy to ensure resolution and exclude underlying malignancy. Electronically Signed   By: Lowella Grip III M.D.   On: 05/09/2017 14:49   Ct Head Wo Contrast  Result Date: 05/09/2017 CLINICAL DATA:  Dizziness with generalize weakness EXAM: CT HEAD WITHOUT CONTRAST TECHNIQUE: Contiguous axial images were obtained from the base of the skull through the vertex without intravenous contrast. COMPARISON:  None. FINDINGS: Brain: No evidence of acute infarction, hemorrhage, hydrocephalus, extra-axial collection or mass lesion/mass effect. Mild atrophy. Minimal small vessel ischemic change of the white matter Vascular: No hyperdense vessels.  No unexpected calcification Skull: Normal. Negative for fracture or focal lesion. Sinuses/Orbits: Moderate mucosal thickening in the right maxillary sinus with mild mucosal thickening in the ethmoid, sphenoid and frontal sinuses. No acute orbital abnormality Other: None IMPRESSION: 1.  No CT evidence for acute intracranial abnormality. 2. Sinusitis Electronically Signed   By: Donavan Foil M.D.   On: 05/09/2017 18:30    DVT Prophylaxis -SCD   AM Labs Ordered, also please review Full Orders  Family Communication: Admission, patients condition and plan of care including tests being ordered have been discussed with the patient  who indicate understanding and agree with the plan   Code Status - Full Code  Likely DC to  home  Condition   Stable   Roxan Hockey M.D on 05/09/2017 at 7:34 PM   Between 7am to 7pm - Pager - [phone] After 7pm go to [email or web address] - password [email or web address]  [facility] -  Office  [phone]  Voice Recognition Viviann Spare dictation system was used to create this note, attempts have been made to correct errors. Please contact the author with questions and/or clarifications.

## 2017-05-09 NOTE — ED Triage Notes (Signed)
Per EMS, pt is coming from home after experiencing some dizziness when walking outside to catch a taxi. Pt has a hx of cerebral palsy so having an unsteady gait is the pt baseline. Per EMS pt is AO x3 but seemed a little "out of it". Pt complains of generalized weakness and states he has the flu although he has not been diagnosed. EMS reports no neurological deficits. EMS reports EKG showing trigeminy.  Pt usually walks with a cane.

## 2017-05-09 NOTE — ED Notes (Signed)
Bed: WA09 Expected date: 05/09/17 Expected time: 1:05 PM Means of arrival: Ambulance Comments: syncope

## 2017-05-09 NOTE — ED Notes (Signed)
RN at bedside collecting labs 

## 2017-05-10 ENCOUNTER — Inpatient Hospital Stay (HOSPITAL_COMMUNITY): Payer: Medicare Other

## 2017-05-10 DIAGNOSIS — F1010 Alcohol abuse, uncomplicated: Secondary | ICD-10-CM

## 2017-05-10 DIAGNOSIS — R55 Syncope and collapse: Secondary | ICD-10-CM

## 2017-05-10 DIAGNOSIS — J101 Influenza due to other identified influenza virus with other respiratory manifestations: Secondary | ICD-10-CM

## 2017-05-10 LAB — CBC
HEMATOCRIT: 40.4 % (ref 39.0–52.0)
Hemoglobin: 14 g/dL (ref 13.0–17.0)
MCH: 31.8 pg (ref 26.0–34.0)
MCHC: 34.7 g/dL (ref 30.0–36.0)
MCV: 91.8 fL (ref 78.0–100.0)
Platelets: 103 10*3/uL — ABNORMAL LOW (ref 150–400)
RBC: 4.4 MIL/uL (ref 4.22–5.81)
RDW: 12.8 % (ref 11.5–15.5)
WBC: 2.9 10*3/uL — AB (ref 4.0–10.5)

## 2017-05-10 LAB — BASIC METABOLIC PANEL
Anion gap: 8 (ref 5–15)
BUN: 15 mg/dL (ref 6–20)
CHLORIDE: 103 mmol/L (ref 101–111)
CO2: 22 mmol/L (ref 22–32)
Calcium: 7.5 mg/dL — ABNORMAL LOW (ref 8.9–10.3)
Creatinine, Ser: 0.8 mg/dL (ref 0.61–1.24)
GFR calc non Af Amer: >60 mL/min (ref >60)
Glucose, Bld: 95 mg/dL (ref 65–99)
POTASSIUM: 3.4 mmol/L — AB (ref 3.5–5.1)
SODIUM: 133 mmol/L — AB (ref 135–145)

## 2017-05-10 LAB — ECHOCARDIOGRAM COMPLETE
Height: 76 in
Weight: 3155.2 oz

## 2017-05-10 LAB — STREP PNEUMONIAE URINARY ANTIGEN: Strep Pneumo Urinary Antigen: NEGATIVE

## 2017-05-10 LAB — HIV ANTIBODY (ROUTINE TESTING W REFLEX): HIV Screen 4th Generation wRfx: NONREACTIVE

## 2017-05-10 MED ORDER — IPRATROPIUM-ALBUTEROL 0.5-2.5 (3) MG/3ML IN SOLN
3.0000 mL | Freq: Three times a day (TID) | RESPIRATORY_TRACT | Status: DC
  Administered 2017-05-10 – 2017-05-12 (×6): 3 mL via RESPIRATORY_TRACT
  Filled 2017-05-10 (×6): qty 3

## 2017-05-10 MED ORDER — CLOMIPRAMINE HCL 25 MG PO CAPS
100.0000 mg | ORAL_CAPSULE | Freq: Every day | ORAL | Status: DC
  Administered 2017-05-10 – 2017-05-12 (×3): 100 mg via ORAL
  Filled 2017-05-10 (×3): qty 4

## 2017-05-10 MED ORDER — POTASSIUM CHLORIDE CRYS ER 20 MEQ PO TBCR
20.0000 meq | EXTENDED_RELEASE_TABLET | Freq: Once | ORAL | Status: AC
  Administered 2017-05-10: 20 meq via ORAL
  Filled 2017-05-10: qty 1

## 2017-05-10 NOTE — Progress Notes (Signed)
[facility]  PROGRESS NOTE  Christopher Dawson QPY:[id number] DOB: [birth year] DOA: 05/09/2017 PCP: Shawnee Knapp, MD   Brief HPI:    62 y.o. male with past medical history relevant for alcohol abuse and Marfan syndrome, depression, anxiety presents for evaluation of dizziness, generalized weakness, cough for 4 days. Felt dizzy and almost passed out while walking today to catch a cab, states taxi driver called EMS. He remembers feeling dizzy, falling back onto his car but did not fall to the ground. No head trauma, LOC or actual fall. Reports gradually worsening nonproductive cough for the last 4 days, thinks he has the flu. Dizziness occurs while walking, alleviated with sitting down and rest and goes away in a few seconds.  In ED.... Influenza A test is positive, CBC with leukopenia, chest x-ray with right-sided pneumonia, patient admits to cough but no hypoxia here in the ED.... CT head without Acute intracranial  findings, sinusitis noted,      Subjective   Patient seen and examined denies shortness of breath. No chest pain.   Assessment/Plan:     1. Community acquired pneumonia- patient care with fever and leukopenia, started on IV Rocephin and Zithromax. Not requiring oxygen. Follow blood culture results. 2. Influenza A-started on Tamiflu, respiratory/droplet precautions, fever and leukopenia likely from influenza. 3. Alcohol abuse-patient admits to drinking 6 to 8 beers a day, as tremors in both upper extremities. Started on William 2 mg TID for two days, PRN Ativan per CIWA protocol. Folic acid and thiamine. 4. ? Syncope-patient denies passing out, as per patient he lost balance and fell backwards near the car and never passed out. Will monitor on telemetry, echocardiogram has been ordered.    DVT prophylaxis: SCDs  Code Status: full code  Family Communication: no family at bedside  Disposition Plan: likely home in two  days.   Consultants:  none  Procedures:  none  Continuous infusions . sodium chloride    . sodium chloride 150 mL/hr at 05/10/17 0909  . azithromycin    . cefTRIAXone (ROCEPHIN)  IV    . sodium chloride Stopped (05/09/17 1849)      Antibiotics:   Anti-infectives (From admission, onward)   Start     Dose/Rate Route Frequency Ordered Stop   05/10/17 1800  azithromycin (ZITHROMAX) 500 mg in dextrose 5 % 250 mL IVPB     500 mg 250 mL/hr over 60 Minutes Intravenous Every 24 hours 05/09/17 1636     05/10/17 1600  cefTRIAXone (ROCEPHIN) 1 g in dextrose 5 % 50 mL IVPB     1 g 100 mL/hr over 30 Minutes Intravenous Every 24 hours 05/09/17 1636     05/09/17 2100  oseltamivir (TAMIFLU) capsule 75 mg     75 mg Oral 2 times daily 05/09/17 1930 05/14/17 2159   05/09/17 1615  cefTRIAXone (ROCEPHIN) 1 g in dextrose 5 % 50 mL IVPB     1 g 100 mL/hr over 30 Minutes Intravenous  Once 05/09/17 1605 05/09/17 1923   05/09/17 1615  azithromycin (ZITHROMAX) 500 mg in dextrose 5 % 250 mL IVPB     500 mg 250 mL/hr over 60 Minutes Intravenous  Once 05/09/17 1605 05/09/17 2012       Objective   Vitals:   05/09/17 2125 05/10/17 0200 05/10/17 0551 05/10/17 1344  BP: 127/67  134/68   Pulse: 84  94   Resp: (!) 23  (!) 22   Temp: 98.1 F (36.7 C)  99.5 F (37.5 C)  TempSrc: Oral  Oral   SpO2:  92% 90% 90%  Weight: 89.4 kg (197 lb 3.2 oz)     Height: 6\' 4"  (1.93 m)       Intake/Output Summary (Last 24 hours) at 05/10/2017 1458 Last data filed at 05/10/2017 0400 Gross per 24 hour  Intake 1125.5 ml  Output -  Net 1125.5 ml   Filed Weights   05/09/17 2012 05/09/17 2125  Weight: 79.4 kg (175 lb) 89.4 kg (197 lb 3.2 oz)     Physical Examination:   Physical Exam: Eyes: No icterus, extraocular muscles intact  Mouth: Oral mucosa is moist, no lesions on palate,  Neck: Supple, no deformities, masses, or tenderness Lungs: Normal respiratory effort, bilateral clear to auscultation, no  crackles or wheezes.  Heart: Regular rate and rhythm, S1 and S2 normal, no murmurs, rubs auscultated Abdomen: BS normoactive,soft,nondistended,non-tender to palpation,no organomegaly Extremities: No pretibial edema, no erythema, no cyanosis, no clubbing. Tremors noted in hands Neuro : Alert and oriented to time, place and person, No focal deficits     Data Reviewed: I have personally reviewed following labs and imaging studies  CBG: Recent Labs  Lab 05/09/17 1342  GLUCAP 130*    CBC: Recent Labs  Lab 05/09/17 1506 05/10/17 0641  WBC 3.7* 2.9*  NEUTROABS 3.0  --   HGB 15.5 14.0  HCT 45.4 40.4  MCV 91.7 91.8  PLT 112* 103*    Basic Metabolic Panel: Recent Labs  Lab 05/09/17 1506 05/10/17 0641  NA 132* 133*  K 3.6 3.4*  CL 99* 103  CO2 24 22  GLUCOSE 112* 95  BUN 23* 15  CREATININE 0.96 0.80  CALCIUM 7.9* 7.5*  MG 2.6*  --     Recent Results (from the past 240 hour(s))  Culture, blood (Routine X 2) w Reflex to ID Panel     Status: None (Preliminary result)   Collection Time: 05/09/17  5:40 PM  Result Value Ref Range Status   Specimen Description BLOOD RIGHT ANTECUBITAL  Final   Special Requests   Final    BOTTLES DRAWN AEROBIC AND ANAEROBIC Blood Culture adequate volume   Culture   Final    NO GROWTH < 12 HOURS Performed at [facility], [address], [place], [place] [address]    Report Status PENDING  Incomplete  Culture, blood (Routine X 2) w Reflex to ID Panel     Status: None (Preliminary result)   Collection Time: 05/09/17  5:41 PM  Result Value Ref Range Status   Specimen Description BLOOD BLOOD LEFT FOREARM  Final   Special Requests   Final    BOTTLES DRAWN AEROBIC AND ANAEROBIC Blood Culture adequate volume   Culture   Final    NO GROWTH < 12 HOURS Performed at [facility], [place] [address] [place], [place] [address]    Report Status PENDING  Incomplete     Liver Function Tests: No results for input(s): AST, ALT, ALKPHOS,  BILITOT, PROT, ALBUMIN in the last 168 hours. No results for input(s): LIPASE, AMYLASE in the last 168 hours. No results for input(s): AMMONIA in the last 168 hours.  Cardiac Enzymes: No results for input(s): CKTOTAL, CKMB, CKMBINDEX, TROPONINI in the last 168 hours. BNP (last 3 results) No results for input(s): BNP in the last 8760 hours.  ProBNP (last 3 results) No results for input(s): PROBNP in the last 8760 hours.    Studies: Dg Chest 2 View  Result Date: 05/09/2017 CLINICAL  DATA:  Dizziness and cough EXAM: CHEST  2 VIEW COMPARISON:  None. FINDINGS: There is airspace consolidation throughout portions of the right upper and right middle lobe regions. There is mild atelectatic change in the left base. Left lung is otherwise clear. Heart is upper normal in size with pulmonary vascularity within normal limits. No adenopathy. No bone lesions. IMPRESSION: Areas of airspace consolidation consistent with pneumonia in the right upper and right middle lobes. Mild left base atelectasis. Heart size within normal limits. Followup PA and lateral chest radiographs recommended in 3-4 weeks following trial of antibiotic therapy to ensure resolution and exclude underlying malignancy. Electronically Signed   By: Lowella Grip III M.D.   On: 05/09/2017 14:49   Ct Head Wo Contrast  Result Date: 05/09/2017 CLINICAL DATA:  Dizziness with generalize weakness EXAM: CT HEAD WITHOUT CONTRAST TECHNIQUE: Contiguous axial images were obtained from the base of the skull through the vertex without intravenous contrast. COMPARISON:  None. FINDINGS: Brain: No evidence of acute infarction, hemorrhage, hydrocephalus, extra-axial collection or mass lesion/mass effect. Mild atrophy. Minimal small vessel ischemic change of the white matter Vascular: No hyperdense vessels.  No unexpected calcification Skull: Normal. Negative for fracture or focal lesion. Sinuses/Orbits: Moderate mucosal thickening in the right maxillary sinus  with mild mucosal thickening in the ethmoid, sphenoid and frontal sinuses. No acute orbital abnormality Other: None IMPRESSION: 1. No CT evidence for acute intracranial abnormality. 2. Sinusitis Electronically Signed   By: Donavan Foil M.D.   On: 05/09/2017 18:30    Scheduled Meds: . clomiPRAMINE  100 mg Oral QHS  . diazepam  2 mg Oral TID  . folic acid  1 mg Oral Daily  . guaiFENesin  600 mg Oral BID  . heparin  5,000 Units Subcutaneous Q8H  . ipratropium-albuterol  3 mL Nebulization TID  . oseltamivir  75 mg Oral BID  . senna  1 tablet Oral BID  . sodium chloride flush  3 mL Intravenous Q12H  . thiamine  100 mg Oral Daily      Time spent: 20 min  [facility] Pager [phone]. If 7PM-7AM, please contact night-coverage at [email or web address], Office  [phone]  password [email or web address]  05/10/2017, 2:58 PM  LOS: 1 day

## 2017-05-10 NOTE — Progress Notes (Signed)
Title: A Randomized, Double-Blind, Placebo-Controlled Dose Ranging Study Evaluating the Safety Pharmacokinetics and Clinical Benefit of FLU-IGIV in Hospitalized Patients with Serious Influenza A infection. IA-001 (ClinicalTrials.gov Identifier: [id number], Protocol No: IA-001, [facility] Protocol #[id number])  RESEARCH SUBJECT. This research study is sponsored by [employer]   ..................................................................................................[name] Went to consent Bernabe Dorce but by now he has early etoh wd with tremors but is oriented - he declined study participation   Dr. Brand Males, M.D., [facility].C.P Pulmonary and Critical Care Medicine Staff Physician, [place] Director - Interstitial Lung Disease  Program  Pulmonary [place] [facility] [place], [place], [address]  Pager: [phone], If no answer or between  15:00h - 7:00h: call [phone] Telephone: [phone]

## 2017-05-10 NOTE — Progress Notes (Signed)
  Echocardiogram 2D Echocardiogram has been performed.  Christopher Dawson T [name] 05/10/2017, 3:00 PM

## 2017-05-10 NOTE — Care Management Note (Signed)
Case Management Note  Patient Details  Name: Christopher Dawson MRN: [id number] Date of Birth: 1955-09-30  Subjective/Objective:                  sepsis  Action/Plan: Date: May 10, 2017 Chart review for discharge needs:  None found for case management. Patient has no questions concerning post hospital care.  Expected Discharge Date:  (unknown)               Expected Discharge Plan:  Home/Self Care  In-House Referral:     Discharge planning Services  CM Consult  Post Acute Care Choice:    Choice offered to:     DME Arranged:    DME Agency:     HH Arranged:    HH Agency:     Status of Service:  In process, will continue to follow  If discussed at Long Length of Stay Meetings, dates discussed:    Additional Comments:  Leeroy Cha, RN 05/10/2017, 9:04 AM

## 2017-05-10 NOTE — Progress Notes (Signed)
*  Preliminary Results* Carotid artery duplex has been completed. Bilateral internal carotid arteries are near-normal with only minimal wall thickening or plaque. Vertebral arteries are patent with antegrade flow.  05/10/2017 10:16 AM  Maudry Mayhew, BS, RVT, RDCS, RDMS

## 2017-05-11 LAB — BLOOD CULTURE ID PANEL (REFLEXED)
ACINETOBACTER BAUMANNII: NOT DETECTED
CANDIDA ALBICANS: NOT DETECTED
CANDIDA GLABRATA: NOT DETECTED
CANDIDA KRUSEI: NOT DETECTED
Candida parapsilosis: NOT DETECTED
Candida tropicalis: NOT DETECTED
ENTEROBACTER CLOACAE COMPLEX: NOT DETECTED
ENTEROCOCCUS SPECIES: NOT DETECTED
Enterobacteriaceae species: NOT DETECTED
Escherichia coli: NOT DETECTED
Haemophilus influenzae: NOT DETECTED
Klebsiella oxytoca: NOT DETECTED
Klebsiella pneumoniae: NOT DETECTED
LISTERIA MONOCYTOGENES: NOT DETECTED
Neisseria meningitidis: NOT DETECTED
PSEUDOMONAS AERUGINOSA: NOT DETECTED
Proteus species: NOT DETECTED
STREPTOCOCCUS PYOGENES: NOT DETECTED
Serratia marcescens: NOT DETECTED
Staphylococcus aureus (BCID): NOT DETECTED
Staphylococcus species: NOT DETECTED
Streptococcus agalactiae: NOT DETECTED
Streptococcus pneumoniae: NOT DETECTED
Streptococcus species: NOT DETECTED

## 2017-05-11 LAB — BASIC METABOLIC PANEL
ANION GAP: 6 (ref 5–15)
BUN: 12 mg/dL (ref 6–20)
CO2: 24 mmol/L (ref 22–32)
Calcium: 7.7 mg/dL — ABNORMAL LOW (ref 8.9–10.3)
Chloride: 106 mmol/L (ref 101–111)
Creatinine, Ser: 0.7 mg/dL (ref 0.61–1.24)
GFR calc Af Amer: >60 mL/min (ref >60)
GLUCOSE: 100 mg/dL — AB (ref 65–99)
Potassium: 3.7 mmol/L (ref 3.5–5.1)
SODIUM: 136 mmol/L (ref 135–145)

## 2017-05-11 LAB — CBC
HCT: 39.8 % (ref 39.0–52.0)
HEMOGLOBIN: 13.3 g/dL (ref 13.0–17.0)
MCH: 31 pg (ref 26.0–34.0)
MCHC: 33.4 g/dL (ref 30.0–36.0)
MCV: 92.8 fL (ref 78.0–100.0)
PLATELETS: 144 10*3/uL — AB (ref 150–400)
RBC: 4.29 MIL/uL (ref 4.22–5.81)
RDW: 13.2 % (ref 11.5–15.5)
WBC: 3.6 10*3/uL — AB (ref 4.0–10.5)

## 2017-05-11 LAB — LEGIONELLA PNEUMOPHILA SEROGP 1 UR AG: L. pneumophila Serogp 1 Ur Ag: NEGATIVE

## 2017-05-11 MED ORDER — DOXYCYCLINE HYCLATE 100 MG PO TABS
100.0000 mg | ORAL_TABLET | Freq: Two times a day (BID) | ORAL | Status: DC
  Administered 2017-05-11 – 2017-05-12 (×2): 100 mg via ORAL
  Filled 2017-05-11 (×2): qty 1

## 2017-05-11 MED ORDER — DEXTROSE 5 % IV SOLN
2.0000 g | INTRAVENOUS | Status: DC
  Administered 2017-05-11 – 2017-05-12 (×2): 2 g via INTRAVENOUS
  Filled 2017-05-11 (×2): qty 2

## 2017-05-11 MED ORDER — CEFUROXIME AXETIL 500 MG PO TABS
500.0000 mg | ORAL_TABLET | Freq: Two times a day (BID) | ORAL | Status: DC
  Filled 2017-05-11: qty 1

## 2017-05-11 NOTE — Progress Notes (Signed)
Pharmacy Antibiotic Note  Christopher Dawson is a 62 y.o. male admitted on 05/09/2017 with CAP + Influenza A. Note previous unreactive BCID result from earlier this AM. MD would now like to treat with Rocephin per pharmacy until culture results.  Plan:  Rocephin 2g IV q24 hr   Height: 6\' 4"  (193 cm) Weight: 197 lb 3.2 oz (89.4 kg) IBW/kg (Calculated) : 86.8  Temp (24hrs), Avg:98.8 F (37.1 C), Min:98.6 F (37 C), Max:99 F (37.2 C)  Recent Labs  Lab 05/09/17 1506 05/09/17 1518 05/10/17 0641 05/11/17 0609  WBC 3.7*  --  2.9* 3.6*  CREATININE 0.96  --  0.80 0.70  LATICACIDVEN  --  0.84  --   --     Estimated Creatinine Clearance: 117.5 mL/min (by C-G formula based on SCr of 0.7 mg/dL).    Allergies  Allergen Reactions  . Codeine Other (See Comments)    "spaced out"      Thank you for allowing pharmacy to be a part of this patient's care.  Reuel Boom, PharmD, BCPS [phone] 05/11/2017, 2:38 PM

## 2017-05-11 NOTE — Progress Notes (Signed)
PHARMACY - PHYSICIAN COMMUNICATION CRITICAL VALUE ALERT - BLOOD CULTURE IDENTIFICATION (BCID)  Christopher Dawson is an 62 y.o. male who presented to [facility] on 05/09/2017 with a chief complaint of PNA  Assessment:  Patient with PNA (include suspected source if known)  Name of physician (or Provider) Contacted: X. Blount  Current antibiotics: Azithromycin 500mg  iv q24hr Ceftriaxone 1gm iv q24hr     Changes to prescribed antibiotics recommended: No changes suggested with non-reactive BCID. Recommendations accepted by provider  Results for orders placed or performed during the hospital encounter of 05/09/17  Blood Culture ID Panel (Reflexed) (Collected: 05/09/2017  5:41 PM)  Result Value Ref Range   Enterococcus species NOT DETECTED NOT DETECTED   Listeria monocytogenes NOT DETECTED NOT DETECTED   Staphylococcus species NOT DETECTED NOT DETECTED   Staphylococcus aureus NOT DETECTED NOT DETECTED   Streptococcus species NOT DETECTED NOT DETECTED   Streptococcus agalactiae NOT DETECTED NOT DETECTED   Streptococcus pneumoniae NOT DETECTED NOT DETECTED   Streptococcus pyogenes NOT DETECTED NOT DETECTED   Acinetobacter baumannii NOT DETECTED NOT DETECTED   Enterobacteriaceae species NOT DETECTED NOT DETECTED   Enterobacter cloacae complex NOT DETECTED NOT DETECTED   Escherichia coli NOT DETECTED NOT DETECTED   Klebsiella oxytoca NOT DETECTED NOT DETECTED   Klebsiella pneumoniae NOT DETECTED NOT DETECTED   Proteus species NOT DETECTED NOT DETECTED   Serratia marcescens NOT DETECTED NOT DETECTED   Haemophilus influenzae NOT DETECTED NOT DETECTED   Neisseria meningitidis NOT DETECTED NOT DETECTED   Pseudomonas aeruginosa NOT DETECTED NOT DETECTED   Candida albicans NOT DETECTED NOT DETECTED   Candida glabrata NOT DETECTED NOT DETECTED   Candida krusei NOT DETECTED NOT DETECTED   Candida parapsilosis NOT DETECTED NOT DETECTED   Candida tropicalis NOT DETECTED NOT DETECTED    [name] 05/11/2017  6:29 AM

## 2017-05-11 NOTE — Progress Notes (Signed)
[facility]  PROGRESS NOTE  Christopher Dawson HDQ:[id number] DOB: January 15, 1956 DOA: 05/09/2017 PCP: Shawnee Knapp, MD   Brief HPI:    62 y.o. male with past medical history relevant for alcohol abuse and Marfan syndrome, depression, anxiety presents for evaluation of dizziness, generalized weakness, cough for 4 days. Felt dizzy and almost passed out while walking today to catch a cab, states taxi driver called EMS. He remembers feeling dizzy, falling back onto his car but did not fall to the ground. No head trauma, LOC or actual fall. Reports gradually worsening nonproductive cough for the last 4 days, thinks he has the flu. Dizziness occurs while walking, alleviated with sitting down and rest and goes away in a few seconds.  In ED.... Influenza A test is positive, CBC with leukopenia, chest x-ray with right-sided pneumonia, patient admits to cough but no hypoxia here in the ED.... CT head without Acute intracranial  findings, sinusitis noted,      Subjective   Patient seen and examined, denies shortness of breath. No coughing. Still have tremors in hands.   Assessment/Plan:     1. Community acquired pneumonia- patient care with fever and leukopenia, started on IV Rocephin and Zithromax. Not requiring oxygen. Follow blood culture results. Blood cultures one out of two bottles going gram-positive cocci, continue Rocephin. 2. Influenza A-started on Tamiflu, respiratory/droplet precautions, fever and leukopenia likely from influenza. 3. Alcohol withdrawal- going  Into mild withdrawal, patient admits to drinking 6 to 8 beers a day, as tremors in both upper extremities. Started on Valium  2 mg TID for two days, PRN Ativan per CIWA protocol. Folic acid and thiamine. 4. ? Syncope-patient denies passing out, as per patient he lost balance and fell backwards near the car and never passed out. Will monitor on telemetry, echocardiogram has been ordered.    DVT prophylaxis: SCDs  Code Status:  full code  Family Communication: no family at bedside  Disposition Plan: likely home in two days.   Consultants:  none  Procedures:  none  Continuous infusions . sodium chloride    . sodium chloride 75 mL/hr at 05/11/17 0406  . sodium chloride Stopped (05/09/17 1849)      Antibiotics:   Anti-infectives (From admission, onward)   Start     Dose/Rate Route Frequency Ordered Stop   05/11/17 2200  doxycycline (VIBRA-TABS) tablet 100 mg     100 mg Oral Every 12 hours 05/11/17 1236     05/11/17 1700  cefUROXime (CEFTIN) tablet 500 mg     500 mg Oral 2 times daily with meals 05/11/17 1236     05/10/17 1800  azithromycin (ZITHROMAX) 500 mg in dextrose 5 % 250 mL IVPB  Status:  Discontinued     500 mg 250 mL/hr over 60 Minutes Intravenous Every 24 hours 05/09/17 1636 05/11/17 1236   05/10/17 1600  cefTRIAXone (ROCEPHIN) 1 g in dextrose 5 % 50 mL IVPB  Status:  Discontinued     1 g 100 mL/hr over 30 Minutes Intravenous Every 24 hours 05/09/17 1636 05/11/17 1236   05/09/17 2100  oseltamivir (TAMIFLU) capsule 75 mg     75 mg Oral 2 times daily 05/09/17 1930 05/14/17 2159   05/09/17 1615  cefTRIAXone (ROCEPHIN) 1 g in dextrose 5 % 50 mL IVPB     1 g 100 mL/hr over 30 Minutes Intravenous  Once 05/09/17 1605 05/09/17 1923   05/09/17 1615  azithromycin (ZITHROMAX) 500 mg in dextrose 5 % 250 mL IVPB  500 mg 250 mL/hr over 60 Minutes Intravenous  Once 05/09/17 1605 05/09/17 2012       Objective   Vitals:   05/10/17 1502 05/10/17 1936 05/11/17 0542 05/11/17 0745  BP: 121/62  113/76   Pulse: 96  92   Resp: (!) 95  18   Temp: 98.6 F (37 C)  99 F (37.2 C)   TempSrc: Oral  Oral   SpO2: 91% 90% 92% 90%  Weight:      Height:        Intake/Output Summary (Last 24 hours) at 05/11/2017 1409 Last data filed at 05/11/2017 0543 Gross per 24 hour  Intake 2933.75 ml  Output 150 ml  Net 2783.75 ml   Filed Weights   05/09/17 2012 05/09/17 2125  Weight: 79.4 kg (175 lb) 89.4  kg (197 lb 3.2 oz)     Physical Examination:   Physical Exam: Eyes: No icterus, extraocular muscles intact  Mouth: Oral mucosa is moist, no lesions on palate,  Neck: Supple, no deformities, masses, or tenderness Lungs: Normal respiratory effort, bilateral clear to auscultation, no crackles or wheezes.  Heart: Regular rate and rhythm, S1 and S2 normal, no murmurs, rubs auscultated Abdomen: BS normoactive,soft,nondistended,non-tender to palpation,no organomegaly Extremities: No pretibial edema, no erythema, no cyanosis, no clubbing. Tremors noted in upper extremities Neuro : Alert and oriented to time, place and person, No focal deficits Skin: No rashes seen on exam     Data Reviewed: I have personally reviewed following labs and imaging studies  CBG: Recent Labs  Lab 05/09/17 1342  GLUCAP 130*    CBC: Recent Labs  Lab 05/09/17 1506 05/10/17 0641 05/11/17 0609  WBC 3.7* 2.9* 3.6*  NEUTROABS 3.0  --   --   HGB 15.5 14.0 13.3  HCT 45.4 40.4 39.8  MCV 91.7 91.8 92.8  PLT 112* 103* 144*    Basic Metabolic Panel: Recent Labs  Lab 05/09/17 1506 05/10/17 0641 05/11/17 0609  NA 132* 133* 136  K 3.6 3.4* 3.7  CL 99* 103 106  CO2 24 22 24   GLUCOSE 112* 95 100*  BUN 23* 15 12  CREATININE 0.96 0.80 0.70  CALCIUM 7.9* 7.5* 7.7*  MG 2.6*  --   --     Recent Results (from the past 240 hour(s))  Culture, blood (Routine X 2) w Reflex to ID Panel     Status: None (Preliminary result)   Collection Time: 05/09/17  5:40 PM  Result Value Ref Range Status   Specimen Description BLOOD RIGHT ANTECUBITAL  Final   Special Requests   Final    BOTTLES DRAWN AEROBIC AND ANAEROBIC Blood Culture adequate volume   Culture   Final    NO GROWTH 2 DAYS Performed at [facility], [place] [address], [place], [place] [address]    Report Status PENDING  Incomplete  Culture, blood (Routine X 2) w Reflex to ID Panel     Status: None (Preliminary result)   Collection Time: 05/09/17   5:41 PM  Result Value Ref Range Status   Specimen Description BLOOD BLOOD LEFT FOREARM  Final   Special Requests   Final    BOTTLES DRAWN AEROBIC AND ANAEROBIC Blood Culture adequate volume   Culture  Setup Time   Final    GRAM POSITIVE COCCI AEROBIC BOTTLE ONLY CRITICAL RESULT CALLED TO, READ BACK BY AND VERIFIED WITH: Colin Rhein [facility] 6294 05/11/17 A BROWNING Performed at [facility], [place] [address], [place], [place] [address]  Culture GRAM POSITIVE COCCI  Final   Report Status PENDING  Incomplete  Blood Culture ID Panel (Reflexed)     Status: None   Collection Time: 05/09/17  5:41 PM  Result Value Ref Range Status   Enterococcus species NOT DETECTED NOT DETECTED Final   Listeria monocytogenes NOT DETECTED NOT DETECTED Final   Staphylococcus species NOT DETECTED NOT DETECTED Final   Staphylococcus aureus NOT DETECTED NOT DETECTED Final   Streptococcus species NOT DETECTED NOT DETECTED Final   Streptococcus agalactiae NOT DETECTED NOT DETECTED Final   Streptococcus pneumoniae NOT DETECTED NOT DETECTED Final   Streptococcus pyogenes NOT DETECTED NOT DETECTED Final   Acinetobacter baumannii NOT DETECTED NOT DETECTED Final   Enterobacteriaceae species NOT DETECTED NOT DETECTED Final   Enterobacter cloacae complex NOT DETECTED NOT DETECTED Final   Escherichia coli NOT DETECTED NOT DETECTED Final   Klebsiella oxytoca NOT DETECTED NOT DETECTED Final   Klebsiella pneumoniae NOT DETECTED NOT DETECTED Final   Proteus species NOT DETECTED NOT DETECTED Final   Serratia marcescens NOT DETECTED NOT DETECTED Final   Haemophilus influenzae NOT DETECTED NOT DETECTED Final   Neisseria meningitidis NOT DETECTED NOT DETECTED Final   Pseudomonas aeruginosa NOT DETECTED NOT DETECTED Final   Candida albicans NOT DETECTED NOT DETECTED Final   Candida glabrata NOT DETECTED NOT DETECTED Final   Candida krusei NOT DETECTED NOT DETECTED Final   Candida parapsilosis NOT DETECTED NOT DETECTED Final    Candida tropicalis NOT DETECTED NOT DETECTED Final    Comment: Performed at [facility], [address] [place], [place] [address]     Liver Function Tests: No results for input(s): AST, ALT, ALKPHOS, BILITOT, PROT, ALBUMIN in the last 168 hours. No results for input(s): LIPASE, AMYLASE in the last 168 hours. No results for input(s): AMMONIA in the last 168 hours.  Cardiac Enzymes: No results for input(s): CKTOTAL, CKMB, CKMBINDEX, TROPONINI in the last 168 hours. BNP (last 3 results) No results for input(s): BNP in the last 8760 hours.  ProBNP (last 3 results) No results for input(s): PROBNP in the last 8760 hours.    Studies: Dg Chest 2 View  Result Date: 05/09/2017 CLINICAL DATA:  Dizziness and cough EXAM: CHEST  2 VIEW COMPARISON:  None. FINDINGS: There is airspace consolidation throughout portions of the right upper and right middle lobe regions. There is mild atelectatic change in the left base. Left lung is otherwise clear. Heart is upper normal in size with pulmonary vascularity within normal limits. No adenopathy. No bone lesions. IMPRESSION: Areas of airspace consolidation consistent with pneumonia in the right upper and right middle lobes. Mild left base atelectasis. Heart size within normal limits. Followup PA and lateral chest radiographs recommended in 3-4 weeks following trial of antibiotic therapy to ensure resolution and exclude underlying malignancy. Electronically Signed   By: Lowella Grip III M.D.   On: 05/09/2017 14:49   Ct Head Wo Contrast  Result Date: 05/09/2017 CLINICAL DATA:  Dizziness with generalize weakness EXAM: CT HEAD WITHOUT CONTRAST TECHNIQUE: Contiguous axial images were obtained from the base of the skull through the vertex without intravenous contrast. COMPARISON:  None. FINDINGS: Brain: No evidence of acute infarction, hemorrhage, hydrocephalus, extra-axial collection or mass lesion/mass effect. Mild atrophy. Minimal small vessel ischemic  change of the white matter Vascular: No hyperdense vessels.  No unexpected calcification Skull: Normal. Negative for fracture or focal lesion. Sinuses/Orbits: Moderate mucosal thickening in the right maxillary sinus with mild mucosal thickening in the ethmoid, sphenoid  and frontal sinuses. No acute orbital abnormality Other: None IMPRESSION: 1. No CT evidence for acute intracranial abnormality. 2. Sinusitis Electronically Signed   By: Donavan Foil M.D.   On: 05/09/2017 18:30    Scheduled Meds: . cefUROXime  500 mg Oral BID WC  . clomiPRAMINE  100 mg Oral QHS  . doxycycline  100 mg Oral Q12H  . folic acid  1 mg Oral Daily  . guaiFENesin  600 mg Oral BID  . heparin  5,000 Units Subcutaneous Q8H  . ipratropium-albuterol  3 mL Nebulization TID  . oseltamivir  75 mg Oral BID  . senna  1 tablet Oral BID  . sodium chloride flush  3 mL Intravenous Q12H  . thiamine  100 mg Oral Daily      Time spent: 20 min  [facility] Pager [phone]. If 7PM-7AM, please contact night-coverage at [email or web address], Office  [phone]  password [email or web address]  05/11/2017, 2:09 PM  LOS: 2 days

## 2017-05-12 DIAGNOSIS — F10230 Alcohol dependence with withdrawal, uncomplicated: Secondary | ICD-10-CM

## 2017-05-12 MED ORDER — IPRATROPIUM-ALBUTEROL 0.5-2.5 (3) MG/3ML IN SOLN
3.0000 mL | Freq: Two times a day (BID) | RESPIRATORY_TRACT | Status: DC
  Administered 2017-05-12 – 2017-05-13 (×2): 3 mL via RESPIRATORY_TRACT
  Filled 2017-05-12 (×2): qty 3

## 2017-05-12 MED ORDER — AMOXICILLIN-POT CLAVULANATE 875-125 MG PO TABS
1.0000 | ORAL_TABLET | Freq: Two times a day (BID) | ORAL | Status: DC
Start: 2017-05-12 — End: 2017-05-13
  Administered 2017-05-12 – 2017-05-13 (×2): 1 via ORAL
  Filled 2017-05-12 (×2): qty 1

## 2017-05-12 NOTE — Progress Notes (Signed)
[facility]  PROGRESS NOTE  Christopher Dawson OXB:[id number] DOB: February 06, 1956 DOA: 05/09/2017 PCP: Shawnee Knapp, MD   Brief HPI:    62 y.o. male with past medical history relevant for alcohol abuse and Marfan syndrome, depression, anxiety presents for evaluation of dizziness, generalized weakness, cough for 4 days. Felt dizzy and almost passed out while walking today to catch a cab, states taxi driver called EMS. He remembers feeling dizzy, falling back onto his car but did not fall to the ground. No head trauma, LOC or actual fall. Reports gradually worsening nonproductive cough for the last 4 days, thinks he has the flu. Dizziness occurs while walking, alleviated with sitting down and rest and goes away in a few seconds.  In ED.... Influenza A test is positive, CBC with leukopenia, chest x-ray with right-sided pneumonia, patient admits to cough but no hypoxia here in the ED.... CT head without Acute intracranial  findings, sinusitis noted,    Subjective   Low-grade temperature overnight, no chest pain, still with mild shortness of breath is patient in activity.  Denies nausea and vomiting.   Assessment/Plan:     1. Community acquired pneumonia-patient with low-grade temperature overnight.  No nausea, no vomiting, improved air movement and no requiring oxygen supplementation.  Will transition antibiotics to p.o. (using Augmentin twice a day) and follow clinical response.  Hopefully discharge home in a.m.  2. Influenza A-continue treatment with Tamiflu.  Patient on respiratory/droplet precautions.  Still with low-grade fever.  And having some leukopenia. 3. Alcohol abuse with uncomplicated withdrawal-alcohol cessation counseling has been provided.  Continue the use of  CIWA protocol.  Continue thiamine and folic acid.   4. ? Syncope-patient denies passing out, as per patient he lost balance and fell backwards near the car, but he never passed out. Continue to monitor on telemetry for now.   2D echo with normal ejection fraction, no wall motion abnormalities and not significant valve disorder.   5. Thrombocytopenia: Associated with active infection and chronic alcohol abuse.  Improving.  No signs of overt bleeding.  Follow trend. 6. 1 out of 2 blood cultures positive for coagulase-negative staph: Most likely contaminant.  Patient nontoxic.  Continue antibiotics as mentioned above.    DVT prophylaxis: SCDs  Code Status: full code  Family Communication: no family at bedside  Disposition Plan: likely home tomorrow if remains stable.   Consultants:  none  Procedures:  2- D echo - Left ventricle: The cavity size was normal. There was moderate   concentric hypertrophy. Systolic function was vigorous. The   estimated ejection fraction was in the range of 65% to 70%. Wall   motion was normal; there were no regional wall motion   abnormalities. The tissue Doppler parameters were normal. Left   ventricular diastolic function parameters were normal. - Aortic valve: Valve area (Vmax): 3.01 cm^2. - Aortic root: The aortic root was normal in size. - Right ventricle: Systolic function was normal. - Right atrium: The atrium was normal in size. - Pulmonary arteries: Systolic pressure was within the normal   range. - Inferior vena cava: The vessel was normal in size. The   respirophasic diameter changes were in the normal range (>= 50%),   consistent with normal central venous pressure. - Pericardium, extracardiac: There was no pericardial effusion.  Continuous infusions . sodium chloride    . sodium chloride 75 mL/hr at 05/12/17 1331  . sodium chloride Stopped (05/09/17 1849)      Antibiotics:   Anti-infectives (From admission,  onward)   Start     Dose/Rate Route Frequency Ordered Stop   05/12/17 2000  amoxicillin-clavulanate (AUGMENTIN) 875-125 MG per tablet 1 tablet     1 tablet Oral Every 12 hours 05/12/17 1731     05/11/17 2200  doxycycline (VIBRA-TABS) tablet 100  mg  Status:  Discontinued     100 mg Oral Every 12 hours 05/11/17 1236 05/12/17 1731   05/11/17 1700  cefUROXime (CEFTIN) tablet 500 mg  Status:  Discontinued     500 mg Oral 2 times daily with meals 05/11/17 1236 05/11/17 1412   05/11/17 1600  cefTRIAXone (ROCEPHIN) 2 g in dextrose 5 % 50 mL IVPB  Status:  Discontinued     2 g 100 mL/hr over 30 Minutes Intravenous Every 24 hours 05/11/17 1437 05/12/17 1731   05/10/17 1800  azithromycin (ZITHROMAX) 500 mg in dextrose 5 % 250 mL IVPB  Status:  Discontinued     500 mg 250 mL/hr over 60 Minutes Intravenous Every 24 hours 05/09/17 1636 05/11/17 1236   05/10/17 1600  cefTRIAXone (ROCEPHIN) 1 g in dextrose 5 % 50 mL IVPB  Status:  Discontinued     1 g 100 mL/hr over 30 Minutes Intravenous Every 24 hours 05/09/17 1636 05/11/17 1236   05/09/17 2100  oseltamivir (TAMIFLU) capsule 75 mg     75 mg Oral 2 times daily 05/09/17 1930 05/14/17 2159   05/09/17 1615  cefTRIAXone (ROCEPHIN) 1 g in dextrose 5 % 50 mL IVPB     1 g 100 mL/hr over 30 Minutes Intravenous  Once 05/09/17 1605 05/09/17 1923   05/09/17 1615  azithromycin (ZITHROMAX) 500 mg in dextrose 5 % 250 mL IVPB     500 mg 250 mL/hr over 60 Minutes Intravenous  Once 05/09/17 1605 05/09/17 2012       Objective   Vitals:   05/11/17 2015 05/12/17 0513 05/12/17 0808 05/12/17 1334  BP: (!) 144/67 136/82  121/75  Pulse: 94 97  95  Resp: (!) 21 20  18   Temp: 98.5 F (36.9 C) 98 F (36.7 C)  98.3 F (36.8 C)  TempSrc: Oral Oral  Oral  SpO2: 96% 92% 98% 98%  Weight:      Height:        Intake/Output Summary (Last 24 hours) at 05/12/2017 1733 Last data filed at 05/12/2017 1500 Gross per 24 hour  Intake 3047.5 ml  Output 1651 ml  Net 1396.5 ml   Filed Weights   05/09/17 2012 05/09/17 2125  Weight: 79.4 kg (175 lb) 89.4 kg (197 lb 3.2 oz)     Physical Examination: General: Afebrile currently (even he is spike low-grade temperature overnight), no chest pain, still feeling slightly  short of breath especially with activity.  No nausea or vomiting. Lungs: Positive scattered rhonchi, no wheezing, no crackles.  Good oxygen saturation on room air.  No using accessory muscles.    Heart: S1 and S2, no rubs, no gallops, no murmurs. Abdomen: Positive bowel sounds, soft abdomen, no distention, no tenderness, no guarding.   Extremities: No edema, no cyanosis, no clubbing.  Patient with some tremors appreciated intermittently on his hands. Neuro : Alert, awake and oriented x3, no focal neurologic deficit appreciated.  Able to follow commands appropriately. Skin: No rash, no petechiae, no open wounds.     Data Reviewed: I have personally reviewed following labs and imaging studies  CBG: Recent Labs  Lab 05/09/17 1342  GLUCAP 130*    CBC: Recent Labs  Lab  05/09/17 1506 05/10/17 0641 05/11/17 0609  WBC 3.7* 2.9* 3.6*  NEUTROABS 3.0  --   --   HGB 15.5 14.0 13.3  HCT 45.4 40.4 39.8  MCV 91.7 91.8 92.8  PLT 112* 103* 144*    Basic Metabolic Panel: Recent Labs  Lab 05/09/17 1506 05/10/17 0641 05/11/17 0609  NA 132* 133* 136  K 3.6 3.4* 3.7  CL 99* 103 106  CO2 24 22 24   GLUCOSE 112* 95 100*  BUN 23* 15 12  CREATININE 0.96 0.80 0.70  CALCIUM 7.9* 7.5* 7.7*  MG 2.6*  --   --     Recent Results (from the past 240 hour(s))  Culture, blood (Routine X 2) w Reflex to ID Panel     Status: None (Preliminary result)   Collection Time: 05/09/17  5:40 PM  Result Value Ref Range Status   Specimen Description BLOOD RIGHT ANTECUBITAL  Final   Special Requests   Final    BOTTLES DRAWN AEROBIC AND ANAEROBIC Blood Culture adequate volume   Culture   Final    NO GROWTH 3 DAYS Performed at [facility], [place] [address], [place], [place] [address]    Report Status PENDING  Incomplete  Culture, blood (Routine X 2) w Reflex to ID Panel     Status: Abnormal (Preliminary result)   Collection Time: 05/09/17  5:41 PM  Result Value Ref Range Status   Specimen Description  BLOOD BLOOD LEFT FOREARM  Final   Special Requests   Final    BOTTLES DRAWN AEROBIC AND ANAEROBIC Blood Culture adequate volume   Culture  Setup Time   Final    GRAM POSITIVE COCCI AEROBIC BOTTLE ONLY CRITICAL RESULT CALLED TO, READ BACK BY AND VERIFIED WITH: Colin Rhein [facility] 3295 05/11/17 A BROWNING Performed at [facility], [place] [address], [place], [place] [address]    Culture STAPHYLOCOCCUS SPECIES (COAGULASE NEGATIVE) (A)  Final   Report Status PENDING  Incomplete  Blood Culture ID Panel (Reflexed)     Status: None   Collection Time: 05/09/17  5:41 PM  Result Value Ref Range Status   Enterococcus species NOT DETECTED NOT DETECTED Final   Listeria monocytogenes NOT DETECTED NOT DETECTED Final   Staphylococcus species NOT DETECTED NOT DETECTED Final   Staphylococcus aureus NOT DETECTED NOT DETECTED Final   Streptococcus species NOT DETECTED NOT DETECTED Final   Streptococcus agalactiae NOT DETECTED NOT DETECTED Final   Streptococcus pneumoniae NOT DETECTED NOT DETECTED Final   Streptococcus pyogenes NOT DETECTED NOT DETECTED Final   Acinetobacter baumannii NOT DETECTED NOT DETECTED Final   Enterobacteriaceae species NOT DETECTED NOT DETECTED Final   Enterobacter cloacae complex NOT DETECTED NOT DETECTED Final   Escherichia coli NOT DETECTED NOT DETECTED Final   Klebsiella oxytoca NOT DETECTED NOT DETECTED Final   Klebsiella pneumoniae NOT DETECTED NOT DETECTED Final   Proteus species NOT DETECTED NOT DETECTED Final   Serratia marcescens NOT DETECTED NOT DETECTED Final   Haemophilus influenzae NOT DETECTED NOT DETECTED Final   Neisseria meningitidis NOT DETECTED NOT DETECTED Final   Pseudomonas aeruginosa NOT DETECTED NOT DETECTED Final   Candida albicans NOT DETECTED NOT DETECTED Final   Candida glabrata NOT DETECTED NOT DETECTED Final   Candida krusei NOT DETECTED NOT DETECTED Final   Candida parapsilosis NOT DETECTED NOT DETECTED Final   Candida tropicalis NOT DETECTED NOT  DETECTED Final    Comment: Performed at [facility], [place] [address], [place], [place] [address]  Studies: No results found.  Scheduled Meds: . amoxicillin-clavulanate  1 tablet Oral Q12H  . clomiPRAMINE  100 mg Oral QHS  . folic acid  1 mg Oral Daily  . guaiFENesin  600 mg Oral BID  . heparin  5,000 Units Subcutaneous Q8H  . ipratropium-albuterol  3 mL Nebulization BID  . oseltamivir  75 mg Oral BID  . senna  1 tablet Oral BID  . sodium chloride flush  3 mL Intravenous Q12H  . thiamine  100 mg Oral Daily      Time spent: 25 min  Barton Dubois MD   [facility] Pager [phone] If 7PM-7AM, please contact night-coverage at [email or web address],  password [facility]  05/12/2017, 5:33 PM  LOS: 3 days

## 2017-05-13 DIAGNOSIS — F10230 Alcohol dependence with withdrawal, uncomplicated: Secondary | ICD-10-CM

## 2017-05-13 DIAGNOSIS — F17200 Nicotine dependence, unspecified, uncomplicated: Secondary | ICD-10-CM

## 2017-05-13 DIAGNOSIS — F10930 Alcohol use, unspecified with withdrawal, uncomplicated: Secondary | ICD-10-CM

## 2017-05-13 LAB — BLOOD CULTURE ID PANEL (REFLEXED)
ACINETOBACTER BAUMANNII: NOT DETECTED
CANDIDA KRUSEI: NOT DETECTED
CANDIDA PARAPSILOSIS: NOT DETECTED
CANDIDA TROPICALIS: NOT DETECTED
Candida albicans: NOT DETECTED
Candida glabrata: NOT DETECTED
ENTEROCOCCUS SPECIES: NOT DETECTED
ESCHERICHIA COLI: NOT DETECTED
Enterobacter cloacae complex: NOT DETECTED
Enterobacteriaceae species: NOT DETECTED
Haemophilus influenzae: NOT DETECTED
KLEBSIELLA OXYTOCA: NOT DETECTED
KLEBSIELLA PNEUMONIAE: NOT DETECTED
Listeria monocytogenes: NOT DETECTED
Neisseria meningitidis: NOT DETECTED
Proteus species: NOT DETECTED
Pseudomonas aeruginosa: NOT DETECTED
SERRATIA MARCESCENS: NOT DETECTED
STAPHYLOCOCCUS AUREUS BCID: NOT DETECTED
STREPTOCOCCUS PYOGENES: NOT DETECTED
Staphylococcus species: NOT DETECTED
Streptococcus agalactiae: NOT DETECTED
Streptococcus pneumoniae: NOT DETECTED
Streptococcus species: NOT DETECTED

## 2017-05-13 LAB — CULTURE, BLOOD (ROUTINE X 2): SPECIAL REQUESTS: ADEQUATE

## 2017-05-13 MED ORDER — GUAIFENESIN ER 600 MG PO TB12: 600.0000 mg | ORAL_TABLET | Freq: Two times a day (BID) | ORAL | 0 refills | Status: DC

## 2017-05-13 MED ORDER — OSELTAMIVIR PHOSPHATE 75 MG PO CAPS: 75.0000 mg | ORAL_CAPSULE | Freq: Two times a day (BID) | ORAL | 0 refills | Status: DC

## 2017-05-13 MED ORDER — CHLORDIAZEPOXIDE HCL 5 MG PO CAPS: ORAL_CAPSULE | ORAL | 0 refills | Status: DC

## 2017-05-13 MED ORDER — ACETAMINOPHEN 325 MG PO TABS: 650.0000 mg | ORAL_TABLET | Freq: Four times a day (QID) | ORAL | 0 refills | Status: DC | PRN

## 2017-05-13 MED ORDER — NICOTINE 14 MG/24HR TD PT24: 14.0000 mg | MEDICATED_PATCH | TRANSDERMAL | 1 refills | Status: AC

## 2017-05-13 MED ORDER — AMOXICILLIN-POT CLAVULANATE 875-125 MG PO TABS: 1.0000 | ORAL_TABLET | Freq: Two times a day (BID) | ORAL | 0 refills | Status: AC

## 2017-05-13 MED ORDER — IPRATROPIUM-ALBUTEROL 0.5-2.5 (3) MG/3ML IN SOLN: 3.0000 mL | RESPIRATORY_TRACT | Status: DC | PRN

## 2017-05-13 MED ORDER — FOLIC ACID 1 MG PO TABS: 1.0000 mg | ORAL_TABLET | Freq: Every day | ORAL | 1 refills | Status: DC

## 2017-05-13 MED ORDER — THIAMINE HCL 100 MG PO TABS: 100.0000 mg | ORAL_TABLET | Freq: Every day | ORAL | 1 refills | Status: DC

## 2017-05-13 NOTE — Care Management Note (Addendum)
Case Management Note  Patient Details  Name: Christopher Dawson MRN: [id number] Date of Birth: 1955-12-13  Subjective/Objective:  CAP, influenza A, ETOH                   Action/Plan: Provided pt with goodrx coupons to obtain meds. Augmentin $12, Tamiflu $41, Librium $10, and others were OTC. States he can afford $64 for meds.   PCP Brigitte Pulse, EVA MD  Expected Discharge Date:  05/13/17               Expected Discharge Plan:  Home/Self Care  In-House Referral:  NA  Discharge planning Services  CM Consult, Medication Assistance  Post Acute Care Choice:  NA Choice offered to:  Spouse  DME Arranged:  N/A DME Agency:  NA  HH Arranged:  NA HH Agency:  NA  Status of Service:  Completed, signed off  If discussed at [place] of Stay Meetings, dates discussed:    Additional Comments:  Erenest Rasher, RN 05/13/2017, 12:41 PM

## 2017-05-13 NOTE — Progress Notes (Addendum)
Pt having difficulty getting into taxi. Has cane at home. Contacted [facility] for RW.   Provided pt with RW for home.  Jonnie Finner RN CCM Case Mgmt phone [phone]

## 2017-05-13 NOTE — Discharge Summary (Signed)
Physician Discharge Summary  Cyprian Gongaware ZOX:[id number] DOB: 06-Feb-1956 DOA: 05/09/2017  PCP: Shawnee Knapp, MD  Admit date: 05/09/2017 Discharge date: 05/13/2017  Time spent: 35 minutes  Recommendations for Outpatient Follow-up:  1. Repeat BMET to follow electrolytes and renal function  2. Repeat CXR in 4 weeks to assure resolution of infiltrates  3. Continue assisting patient in alcohol and tobacco cessation as needed.    Discharge Diagnoses:  Principal Problem:   Community acquired pneumonia of right middle lobe of lung ([place]) Active Problems:   Tobacco use disorder   Influenza A   ETOH abuse   Alcohol withdrawal syndrome without complication ([place])   Discharge Condition: stable an improved. Patient discharge home with instructions to follow up with PCP in 10 days.   Diet recommendation: regular diet   Filed Weights   05/09/17 2012 05/09/17 2125  Weight: 79.4 kg (175 lb) 89.4 kg (197 lb 3.2 oz)    History of present illness:  62 y.o.malewith past medical history relevant for alcohol abuse and Marfan syndrome, depression, anxiety presents for evaluation of dizziness, generalized weakness, cough for 4 days. Felt dizzy and almost passed out while walking today to catch a cab, states taxi driver called EMS. He remembers feeling dizzy, falling back onto his car but did not fall to the ground. No head trauma, LOC or actual fall. Reports gradually worsening nonproductive cough for the last 4 days, thinks he has the flu. Dizziness occurs while walking, alleviated with sitting down and rest and goes away in a few seconds.  In ED.Marland KitchenMarland KitchenMarland KitchenInfluenzaAtest is positive,CBC with leukopenia,chest x-ray with right-sided pneumonia, patient admits to cough but no hypoxia here in the ED.... CT headwithoutAcuteintracranial findings, sinusitis noted   Hospital Course:  1. Community acquired pneumonia-patient afebrile over 36 hours prior to discharge.  No nausea, no vomiting, with improved air  movement and no requiring oxygen supplementation.  Will discharge on Augmentin PO for 6 more days to complete treatment for CAP. Recommending CXR in 4 weeks or so to assure resolution of infiltrates.  2. Influenza A- Patient was kept on respiratory/droplet precautions. afebrile, no needing oxygen supplementation and stable for discharge. Will complete Tamiflu course at discharge. 3. Alcohol abuse with uncomplicated withdrawal-alcohol cessation counseling has been provided. While inpatient treated with benzodiazepine and monitor on CIWA protocol.  Continue thiamine, folic acid and tapering Librium course at discharge..   4. Near-Syncope-patient denies passing out, as per patient he lost balance and fell backwards near the car, but he never passed out. telemetry without any abnormalities. 2D echo reassuring with normal ejection fraction, no wall motion abnormalities and not significant valve disorder.   5. Thrombocytopenia: Associated with active infection and chronic alcohol abuse.  Improving.  No signs of overt bleeding.  Reasses trend at his hospital follow up visit. . 6. 1 out of 2 blood cultures positive for coagulase-negative staph: Most likely contaminant.  Patient nontoxic.  Continue antibiotics as mentioned above. 7. Tobacco abuse: cessation counseling provided and patient discharge on nicotine patch.      Procedures:  2- D echo - Left ventricle: The cavity size was normal. There was moderate concentric hypertrophy. Systolic function was vigorous. The estimated ejection fraction was in the range of 65% to 70%. Wall motion was normal; there were no regional wall motion abnormalities. The tissue Doppler parameters were normal. Left ventricular diastolic function parameters were normal. - Aortic valve: Valve area (Vmax): 3.01 cm^2. - Aortic root: The aortic root was normal in size. - Right ventricle:  Systolic function was normal. - Right atrium: The atrium was normal in size. -  Pulmonary arteries: Systolic pressure was within the normal range. - Inferior vena cava: The vessel was normal in size. The respirophasic diameter changes were in the normal range (>= 50%), consistent with normal central venous pressure. - Pericardium, extracardiac: There was no pericardial effusion.   Consultations:  None   Discharge Exam: Vitals:   05/13/17 0556 05/13/17 0819  BP: (!) 143/82   Pulse: 96   Resp: 18   Temp: 98.4 F (36.9 C)   SpO2: 95% 92%    General: Afebrile, no CP, no SOB, feeling remarkable better and asking to be discharge.   Lungs: Positive scattered rhonchi, no wheezing, no crackles.  Good oxygen saturation on room air.  No using accessory muscles.    Heart: S1 and S2, no rubs, no gallops, no murmurs. Abdomen: Positive bowel sounds, soft abdomen, no distention, no tenderness, no guarding.   Extremities: No edema, no cyanosis, no clubbing.  Patient with mild tremors appreciated intermittently on his hands. Neuro : Alert, awake and oriented x3, no focal neurologic deficit appreciated.  Able to follow commands appropriately. Skin: No rash, no petechiae, no open wounds.      Discharge Instructions   Discharge Instructions    Discharge instructions   Complete by:  As directed    Take medications as prescribed  Arrange follow up with PCP in 10 days Keep yourself well hydrated  Stop alcohol and tobacco abuse     Allergies as of 05/13/2017      Reactions   Codeine Other (See Comments)   "spaced out"      Medication List    STOP taking these medications   cephALEXin 500 MG capsule Commonly known as:  KEFLEX   LORazepam 1 MG tablet Commonly known as:  ATIVAN     TAKE these medications   acetaminophen 325 MG tablet Commonly known as:  TYLENOL Take 2 tablets (650 mg total) by mouth every 6 (six) hours as needed for mild pain or headache (or Fever >/= 101).   amoxicillin-clavulanate 875-125 MG tablet Commonly known as:   AUGMENTIN Take 1 tablet by mouth every 12 (twelve) hours for 6 days.   chlordiazePOXIDE 5 MG capsule Commonly known as:  LIBRIUM 2 tablet by mouth twice a day for 2 days; then 1 tablet twice a day for two days; then 1 tablet daily for three days and then stop librium.   clomiPRAMINE 50 MG capsule Commonly known as:  ANAFRANIL TAKE 2 CAPSULES BY MOUTH AT BEDTIME   folic acid 1 MG tablet Commonly known as:  FOLVITE Take 1 tablet (1 mg total) by mouth daily. Start taking on:  05/14/2017   guaiFENesin 600 MG 12 hr tablet Commonly known as:  MUCINEX Take 1 tablet (600 mg total) by mouth 2 (two) times daily.   nicotine 14 mg/24hr patch Commonly known as:  NICODERM CQ - dosed in mg/24 hours Place 1 patch (14 mg total) onto the skin daily.   oseltamivir 75 MG capsule Commonly known as:  TAMIFLU Take 1 capsule (75 mg total) by mouth 2 (two) times daily.   thiamine 100 MG tablet Take 1 tablet (100 mg total) by mouth daily. Start taking on:  05/14/2017      Allergies  Allergen Reactions  . Codeine Other (See Comments)    "spaced out"   Follow-up Information    Shawnee Knapp, MD. Schedule an appointment as soon as possible  for a visit in 10 day(s).   Specialty:  Family Medicine Contact information: [place] [address] [phone]           The results of significant diagnostics from this hospitalization (including imaging, microbiology, ancillary and laboratory) are listed below for reference.    Significant Diagnostic Studies: Dg Chest 2 View  Result Date: 05/09/2017 CLINICAL DATA:  Dizziness and cough EXAM: CHEST  2 VIEW COMPARISON:  None. FINDINGS: There is airspace consolidation throughout portions of the right upper and right middle lobe regions. There is mild atelectatic change in the left base. Left lung is otherwise clear. Heart is upper normal in size with pulmonary vascularity within normal limits. No adenopathy. No bone lesions. IMPRESSION: Areas  of airspace consolidation consistent with pneumonia in the right upper and right middle lobes. Mild left base atelectasis. Heart size within normal limits. Followup PA and lateral chest radiographs recommended in 3-4 weeks following trial of antibiotic therapy to ensure resolution and exclude underlying malignancy. Electronically Signed   By: Lowella Grip III M.D.   On: 05/09/2017 14:49   Ct Head Wo Contrast  Result Date: 05/09/2017 CLINICAL DATA:  Dizziness with generalize weakness EXAM: CT HEAD WITHOUT CONTRAST TECHNIQUE: Contiguous axial images were obtained from the base of the skull through the vertex without intravenous contrast. COMPARISON:  None. FINDINGS: Brain: No evidence of acute infarction, hemorrhage, hydrocephalus, extra-axial collection or mass lesion/mass effect. Mild atrophy. Minimal small vessel ischemic change of the white matter Vascular: No hyperdense vessels.  No unexpected calcification Skull: Normal. Negative for fracture or focal lesion. Sinuses/Orbits: Moderate mucosal thickening in the right maxillary sinus with mild mucosal thickening in the ethmoid, sphenoid and frontal sinuses. No acute orbital abnormality Other: None IMPRESSION: 1. No CT evidence for acute intracranial abnormality. 2. Sinusitis Electronically Signed   By: Donavan Foil M.D.   On: 05/09/2017 18:30    Microbiology: Recent Results (from the past 240 hour(s))  Culture, blood (Routine X 2) w Reflex to ID Panel     Status: None (Preliminary result)   Collection Time: 05/09/17  5:40 PM  Result Value Ref Range Status   Specimen Description BLOOD RIGHT ANTECUBITAL  Final   Special Requests   Final    BOTTLES DRAWN AEROBIC AND ANAEROBIC Blood Culture adequate volume   Culture  Setup Time   Final    GRAM POSITIVE RODS AEROBIC BOTTLE ONLY CRITICAL RESULT CALLED TO, READ BACK BY AND VERIFIED WITH: Sanda Klein AT 3009 05/13/17 BY L BENFIELD Performed at [facility], [place] [address], [place],  [place] [address]    Culture GRAM POSITIVE RODS  Final   Report Status PENDING  Incomplete  Blood Culture ID Panel (Reflexed)     Status: None   Collection Time: 05/09/17  5:40 PM  Result Value Ref Range Status   Enterococcus species NOT DETECTED NOT DETECTED Final   Listeria monocytogenes NOT DETECTED NOT DETECTED Final   Staphylococcus species NOT DETECTED NOT DETECTED Final   Staphylococcus aureus NOT DETECTED NOT DETECTED Final   Streptococcus species NOT DETECTED NOT DETECTED Final   Streptococcus agalactiae NOT DETECTED NOT DETECTED Final   Streptococcus pneumoniae NOT DETECTED NOT DETECTED Final   Streptococcus pyogenes NOT DETECTED NOT DETECTED Final   Acinetobacter baumannii NOT DETECTED NOT DETECTED Final   Enterobacteriaceae species NOT DETECTED NOT DETECTED Final   Enterobacter cloacae complex NOT DETECTED NOT DETECTED Final   Escherichia coli NOT DETECTED NOT DETECTED Final   Klebsiella  oxytoca NOT DETECTED NOT DETECTED Final   Klebsiella pneumoniae NOT DETECTED NOT DETECTED Final   Proteus species NOT DETECTED NOT DETECTED Final   Serratia marcescens NOT DETECTED NOT DETECTED Final   Haemophilus influenzae NOT DETECTED NOT DETECTED Final   Neisseria meningitidis NOT DETECTED NOT DETECTED Final   Pseudomonas aeruginosa NOT DETECTED NOT DETECTED Final   Candida albicans NOT DETECTED NOT DETECTED Final   Candida glabrata NOT DETECTED NOT DETECTED Final   Candida krusei NOT DETECTED NOT DETECTED Final   Candida parapsilosis NOT DETECTED NOT DETECTED Final   Candida tropicalis NOT DETECTED NOT DETECTED Final    Comment: Performed at [facility], [place] [address] [place], [place] [address]  Culture, blood (Routine X 2) w Reflex to ID Panel     Status: Abnormal   Collection Time: 05/09/17  5:41 PM  Result Value Ref Range Status   Specimen Description BLOOD BLOOD LEFT FOREARM  Final   Special Requests   Final    BOTTLES DRAWN AEROBIC AND ANAEROBIC Blood Culture adequate volume    Culture  Setup Time   Final    GRAM POSITIVE COCCI AEROBIC BOTTLE ONLY CRITICAL RESULT CALLED TO, READ BACK BY AND VERIFIED WITH: Colin Rhein PHARMD 6213 05/11/17 A BROWNING    Culture (A)  Final    STAPHYLOCOCCUS SPECIES (COAGULASE NEGATIVE) THE SIGNIFICANCE OF ISOLATING THIS ORGANISM FROM A SINGLE SET OF BLOOD CULTURES WHEN MULTIPLE SETS ARE DRAWN IS UNCERTAIN. PLEASE NOTIFY THE MICROBIOLOGY DEPARTMENT WITHIN ONE WEEK IF SPECIATION AND SENSITIVITIES ARE REQUIRED. Performed at [facility], [place] [address], [place], [place] [address]    Report Status 05/13/2017 FINAL  Final  Blood Culture ID Panel (Reflexed)     Status: None   Collection Time: 05/09/17  5:41 PM  Result Value Ref Range Status   Enterococcus species NOT DETECTED NOT DETECTED Final   Listeria monocytogenes NOT DETECTED NOT DETECTED Final   Staphylococcus species NOT DETECTED NOT DETECTED Final   Staphylococcus aureus NOT DETECTED NOT DETECTED Final   Streptococcus species NOT DETECTED NOT DETECTED Final   Streptococcus agalactiae NOT DETECTED NOT DETECTED Final   Streptococcus pneumoniae NOT DETECTED NOT DETECTED Final   Streptococcus pyogenes NOT DETECTED NOT DETECTED Final   Acinetobacter baumannii NOT DETECTED NOT DETECTED Final   Enterobacteriaceae species NOT DETECTED NOT DETECTED Final   Enterobacter cloacae complex NOT DETECTED NOT DETECTED Final   Escherichia coli NOT DETECTED NOT DETECTED Final   Klebsiella oxytoca NOT DETECTED NOT DETECTED Final   Klebsiella pneumoniae NOT DETECTED NOT DETECTED Final   Proteus species NOT DETECTED NOT DETECTED Final   Serratia marcescens NOT DETECTED NOT DETECTED Final   Haemophilus influenzae NOT DETECTED NOT DETECTED Final   Neisseria meningitidis NOT DETECTED NOT DETECTED Final   Pseudomonas aeruginosa NOT DETECTED NOT DETECTED Final   Candida albicans NOT DETECTED NOT DETECTED Final   Candida glabrata NOT DETECTED NOT DETECTED Final   Candida krusei NOT DETECTED NOT  DETECTED Final   Candida parapsilosis NOT DETECTED NOT DETECTED Final   Candida tropicalis NOT DETECTED NOT DETECTED Final    Comment: Performed at [facility], [place] [address], [place], [place] [address]     Labs: Basic Metabolic Panel: Recent Labs  Lab 05/09/17 1506 05/10/17 0641 05/11/17 0609  NA 132* 133* 136  K 3.6 3.4* 3.7  CL 99* 103 106  CO2 24 22 24   GLUCOSE 112* 95 100*  BUN 23* 15 12  CREATININE 0.96  0.80 0.70  CALCIUM 7.9* 7.5* 7.7*  MG 2.6*  --   --    CBC: Recent Labs  Lab 05/09/17 1506 05/10/17 0641 05/11/17 0609  WBC 3.7* 2.9* 3.6*  NEUTROABS 3.0  --   --   HGB 15.5 14.0 13.3  HCT 45.4 40.4 39.8  MCV 91.7 91.8 92.8  PLT 112* 103* 144*    CBG: Recent Labs  Lab 05/09/17 1342  GLUCAP 130*    Signed:  Barton Dubois MD.  [facility] 05/13/2017, 10:38 AM

## 2017-05-13 NOTE — Progress Notes (Signed)
PHARMACY - PHYSICIAN COMMUNICATION CRITICAL VALUE ALERT - BLOOD CULTURE IDENTIFICATION (BCID)  Christopher Dawson is an 62 y.o. male who presented to [facility] on 05/09/2017 with a chief complaint of PNA  Assessment:  PNA (include suspected source if known)  Name of physician (or Provider) Contacted: [place]  Current antibiotics: Augmentin  Changes to prescribed antibiotics recommended:   No changes suggested with non-reactive BCID. Recommendations accepted by provider    Results for orders placed or performed during the hospital encounter of 05/09/17  Blood Culture ID Panel (Reflexed) (Collected: 05/09/2017  5:40 PM)  Result Value Ref Range   Enterococcus species NOT DETECTED NOT DETECTED   Listeria monocytogenes NOT DETECTED NOT DETECTED   Staphylococcus species NOT DETECTED NOT DETECTED   Staphylococcus aureus NOT DETECTED NOT DETECTED   Streptococcus species NOT DETECTED NOT DETECTED   Streptococcus agalactiae NOT DETECTED NOT DETECTED   Streptococcus pneumoniae NOT DETECTED NOT DETECTED   Streptococcus pyogenes NOT DETECTED NOT DETECTED   Acinetobacter baumannii NOT DETECTED NOT DETECTED   Enterobacteriaceae species NOT DETECTED NOT DETECTED   Enterobacter cloacae complex NOT DETECTED NOT DETECTED   Escherichia coli NOT DETECTED NOT DETECTED   Klebsiella oxytoca NOT DETECTED NOT DETECTED   Klebsiella pneumoniae NOT DETECTED NOT DETECTED   Proteus species NOT DETECTED NOT DETECTED   Serratia marcescens NOT DETECTED NOT DETECTED   Haemophilus influenzae NOT DETECTED NOT DETECTED   Neisseria meningitidis NOT DETECTED NOT DETECTED   Pseudomonas aeruginosa NOT DETECTED NOT DETECTED   Candida albicans NOT DETECTED NOT DETECTED   Candida glabrata NOT DETECTED NOT DETECTED   Candida krusei NOT DETECTED NOT DETECTED   Candida parapsilosis NOT DETECTED NOT DETECTED   Candida tropicalis NOT DETECTED NOT DETECTED   Eudelia Bunch, Pharm.D. [phone] 05/13/2017 7:46 AM

## 2017-05-13 NOTE — Progress Notes (Signed)
Christopher Dawson to be D/C'd Home per MD order.  Discussed prescriptions and follow up appointments with the patient. Prescriptions given to patient, medication list explained in detail. Pt verbalized understanding.  Allergies as of 05/13/2017      Reactions   Codeine Other (See Comments)   "spaced out"      Medication List    STOP taking these medications   cephALEXin 500 MG capsule Commonly known as:  KEFLEX   LORazepam 1 MG tablet Commonly known as:  ATIVAN     TAKE these medications   acetaminophen 325 MG tablet Commonly known as:  TYLENOL Take 2 tablets (650 mg total) by mouth every 6 (six) hours as needed for mild pain or headache (or Fever >/= 101).   amoxicillin-clavulanate 875-125 MG tablet Commonly known as:  AUGMENTIN Take 1 tablet by mouth every 12 (twelve) hours for 6 days.   chlordiazePOXIDE 5 MG capsule Commonly known as:  LIBRIUM 2 tablet by mouth twice a day for 2 days; then 1 tablet twice a day for two days; then 1 tablet daily for three days and then stop librium.   clomiPRAMINE 50 MG capsule Commonly known as:  ANAFRANIL TAKE 2 CAPSULES BY MOUTH AT BEDTIME   folic acid 1 MG tablet Commonly known as:  FOLVITE Take 1 tablet (1 mg total) by mouth daily. Start taking on:  05/14/2017   guaiFENesin 600 MG 12 hr tablet Commonly known as:  MUCINEX Take 1 tablet (600 mg total) by mouth 2 (two) times daily.   nicotine 14 mg/24hr patch Commonly known as:  NICODERM CQ - dosed in mg/24 hours Place 1 patch (14 mg total) onto the skin daily.   oseltamivir 75 MG capsule Commonly known as:  TAMIFLU Take 1 capsule (75 mg total) by mouth 2 (two) times daily.   thiamine 100 MG tablet Take 1 tablet (100 mg total) by mouth daily. Start taking on:  05/14/2017       Vitals:   05/13/17 0556 05/13/17 0819  BP: (!) 143/82   Pulse: 96   Resp: 18   Temp: 98.4 F (36.9 C)   SpO2: 95% 92%    Skin clean, dry and intact without evidence of skin break down, no  evidence of skin tears noted. IV catheter discontinued intact. Site without signs and symptoms of complications. Dressing and pressure applied. Pt denies pain at this time. No complaints noted.  An After Visit Summary was printed and given to the patient. Patient escorted via [place] to the lobby, and D/C home via cab he was calling for himself to go to the liquor store.  Haywood Lasso BSN, RN [employer] Phone [phone]

## 2017-05-13 NOTE — Progress Notes (Signed)
Pt refused cab voucher offered from social work as he wanted to stop at liquor store on way home. Patient stated he will arrange his own transport to the liquor store/ Pt also left discharge paperwork and prescriptions in room after being handed them by nurse. Sent to medical records. [name]

## 2017-05-15 LAB — CULTURE, BLOOD (ROUTINE X 2): Special Requests: ADEQUATE

## 2018-01-03 ENCOUNTER — Other Ambulatory Visit: Payer: Self-pay | Admitting: Family Medicine

## 2018-01-03 NOTE — Telephone Encounter (Signed)
Patient called and advised he will need to schedule an appointment for his medication refill. The phone call was lost, attempted to return call to the patient and no answer, left VM to return call to the office to schedule appointment.  Anafranil refill Last Refill:12/28/16 (expired) Last OV: 12/28/16 VIF:[id number] Pharmacy: [id number] [place], [place] [phone] (Phone) [phone] (Fax)

## 2018-01-04 NOTE — Telephone Encounter (Signed)
Pt called and would like medication. Please advise. Pt states that he needs this as soon as possible

## 2018-01-07 ENCOUNTER — Telehealth: Payer: Self-pay | Admitting: Family Medicine

## 2018-01-07 MED ORDER — CLOMIPRAMINE HCL 50 MG PO CAPS: 100.0000 mg | ORAL_CAPSULE | Freq: Every day | ORAL | 0 refills | Status: DC

## 2018-01-07 NOTE — Telephone Encounter (Signed)
Refill of Anafranil  LRF 12/28/16  #180  3 refills  LOV 12/28/16 Dr. Brigitte Pulse    [id number] [place], [place] [address] [place]        [phone] (Phone) [phone] (Fax)

## 2018-01-07 NOTE — Telephone Encounter (Signed)
Copied from [place] [id number]. Topic: Quick Communication - Rx Refill/Question >> Jan 07, 2018  8:11 AM Celedonio Savage L wrote: Medication: clomiPRAMINE (ANAFRANIL) 50 MG capsule  pt out of medicine ran out on Friday  Has the patient contacted their pharmacy? Yes.  (Agent: If no, request that the patient contact the pharmacy for the refill.) (Agent: If yes, when and what did the pharmacy advise?) pharmacy told him that they haven't heard from provider  Preferred Pharmacy (with phone number or street name): [id number] [place], [place] - [address] [phone] (Phone) [phone] (Fax)    Agent: Please be advised that RX refills may take up to 3 business days. We ask that you follow-up with your pharmacy.

## 2018-01-07 NOTE — Telephone Encounter (Signed)
Needs OV with me for additional refills. 3 mos sent to pharm - please let pt know asap

## 2018-01-08 ENCOUNTER — Ambulatory Visit: Payer: Medicare Other | Admitting: Emergency Medicine

## 2018-01-08 ENCOUNTER — Encounter: Payer: Self-pay | Admitting: Family Medicine

## 2018-01-08 NOTE — Telephone Encounter (Signed)
Pt has an appt sched w/ Dr. Carlota Raspberry now for 10/15 for med refills but I would really like to see pt - he never followed up on several abnormalities found during his hosp 9 mos ago and so is going to need FASTING labs at his next OV - as I had sent pt 3 mos of his medications - might be easiest for all involved if pt could reschedule to see me in November - I don't want to have to throw one of my colleagues into the middle of an ongoing w/u (and pt sometimes isn't a very good health advocate for himself - e.g. Would be unlikely to mention to Dr. Carlota Raspberry that he still needs w/u on some lab abnml found during his hosp last Jan but since I have already received/reviewed all pt's hosp course and know pt's history I think it will be more efficient for pt and better care due to continuity reasons and knowledge of pt's physical (and mental) health barriers that we need to focus on.  If pt wants to transfer care to other provider - that is totally fine - but if he is not INTENTIONALLY doing so, than please have him reschedule with me so we don't get to many cooks in his kitchen.

## 2018-01-16 NOTE — Telephone Encounter (Signed)
Patient scheduled for the next available with Brigitte Pulse on 03/26/2018 at 10:40am.

## 2018-02-05 ENCOUNTER — Ambulatory Visit: Payer: Medicare Other | Admitting: Family Medicine

## 2018-03-06 ENCOUNTER — Ambulatory Visit: Payer: Medicare Other | Admitting: Family Medicine

## 2018-03-26 ENCOUNTER — Ambulatory Visit: Payer: Medicare Other | Admitting: Family Medicine

## 2018-03-29 ENCOUNTER — Ambulatory Visit: Payer: Medicare Other | Admitting: Family Medicine

## 2018-04-07 ENCOUNTER — Other Ambulatory Visit: Payer: Self-pay | Admitting: Family Medicine

## 2018-04-08 NOTE — Telephone Encounter (Signed)
Requested medication (s) are due for refill today: yes  Requested medication (s) are on the active medication list: yes  Last refill:  01/07/18  Future visit scheduled: yes 05/09/18  Notes to clinic:  Antidepressants - heterocyclics (TCAs) failed.  PHQ - 9 due. LOV 12/28/16.  Requested Prescriptions  Pending Prescriptions Disp Refills   clomiPRAMINE (ANAFRANIL) 50 MG capsule [Pharmacy Med Name: CLOMIPRAMINE 50 MG CAPSULE] 180 capsule 1    Sig: TAKE 2 CAPSULES BY MOUTH AT BEDTIME *NEED VISIT FOR REFILLS*     Psychiatry:  Antidepressants - Heterocyclics (TCAs) Failed - 04/08/2018 11:23 AM      Failed - Completed PHQ-2 or PHQ-9 in the last 360 days.      Failed - Valid encounter within last 6 months    Recent Outpatient Visits          1 year ago Marfan syndrome   [facility], Laurey Arrow, MD   2 years ago Marfan syndrome   [facility], Laurey Arrow, MD   3 years ago Anxiety state   [facility], Laurey Arrow, MD   4 years ago Tobacco use disorder   [facility], Laurey Arrow, MD   5 years ago Tobacco use disorder   [facility], Laurey Arrow, MD      Future Appointments            In 1 month Brigitte Pulse Laurey Arrow, MD Primary Care at [place], [facility]

## 2018-04-08 NOTE — Telephone Encounter (Signed)
Patient called in and stated he is completely out of meds and would like them refilled today . Educated patient on process and advised him it takes up to 3 business days for med to be refilled

## 2018-04-08 NOTE — Telephone Encounter (Signed)
Patient is requesting a refill of the following medications: Requested Prescriptions   Pending Prescriptions Disp Refills  . clomiPRAMINE (ANAFRANIL) 50 MG capsule [Pharmacy Med Name: CLOMIPRAMINE 50 MG CAPSULE] 180 capsule 1    Sig: TAKE 2 CAPSULES BY MOUTH AT BEDTIME *NEED VISIT FOR REFILLS*    Date of patient request: 04/08/18 Last office visit: 12/28/2016 Date of last refill:01/07/18 Last refill amount: 180 Follow up time period per chart: return in 1 yr around 12/28/17. Pt has upcoming appt on 05/09/2018  Please advise. Dgaddy, CMA

## 2018-04-10 NOTE — Telephone Encounter (Signed)
Patient calling to check the status of getting this medication sent to the pharmacy. States that it has been 3 days and he needs this medication today. Please advise.

## 2018-04-11 ENCOUNTER — Other Ambulatory Visit: Payer: Self-pay | Admitting: Family Medicine

## 2018-04-11 NOTE — Telephone Encounter (Signed)
Please review for refill for Anafranil.  Appointment scheduled for 05/09/18  Needs PHQ -2 or PHQ -9.  Last visit was 12/28/16.  Provider  E. Brigitte Pulse

## 2018-04-11 NOTE — Telephone Encounter (Signed)
Patient checking on the status of his prescription again.  It was received 04/07/18

## 2018-04-11 NOTE — Telephone Encounter (Signed)
Please advise 

## 2018-04-11 NOTE — Telephone Encounter (Signed)
Copied from [place] [phone]. Topic: Quick Communication - Rx Refill/Question >> Apr 11, 2018  2:38 PM Blase Mess A wrote: Medication: clomiPRAMINE (ANAFRANIL) 50 MG capsule [[id number]]   Has the patient contacted their pharmacy? Yes  (Agent: If no, request that the patient contact the pharmacy for the refill.) (Agent: If yes, when and what did the pharmacy advise  Preferred Pharmacy (with phone number or street name): [id number] [address], [place] [phone] (Phone) [phone] (Fax)    Agent: Please be advised that RX refills may take up to 3 business days. We ask that you follow-up with your pharmacy.

## 2018-04-12 MED ORDER — CLOMIPRAMINE HCL 50 MG PO CAPS: 100.0000 mg | ORAL_CAPSULE | Freq: Every day | ORAL | 0 refills | Status: DC

## 2018-04-12 NOTE — Addendum Note (Signed)
Addended by: Shawnee Knapp on: 04/12/2018 10:54 PM   Modules accepted: Orders

## 2018-04-12 NOTE — Telephone Encounter (Signed)
Patient is calling to check on this. He would like a call back today with status update. Please contact patient. [phone]

## 2018-04-12 NOTE — Telephone Encounter (Signed)
Please advise 

## 2018-04-12 NOTE — Telephone Encounter (Signed)
Pharmacy calling and requesting enough refill to last until pts appt on 05/09/18.

## 2018-04-12 NOTE — Telephone Encounter (Signed)
Patient has not been seen in the office in 15 months and never had recommended hospital follow-up.  He has canceled appointments that he has scheduled with me on 12/6, 12/26, and 1/16 so he does not have any future appointments scheduled. It appears that he cancelled his 1/16 visit because he waited until he ran out of medication to request a refill and then was upset that it done as quickly as he wanted despite that fact that I was VERY clear on his prior refill that he needed an appointment for additional.  I sent in a 30d supply of medication but will have to resched an appt to be within the next month - 1/21 or prior  - to avoid running out AGAIN. It is unsafe to continue refilling medications without monitoring  so we can continue to make sure the medicines are keeping her healthy and safe before we refill them further. Needs FASTING labs and EKG at his OV. Thanks.  Meds ordered this encounter  Medications  . clomiPRAMINE (ANAFRANIL) 50 MG capsule    Sig: Take 2 capsules (100 mg total) by mouth at bedtime. **NEED VISIT FOR ADDITIONAL REFILLS**    Dispense:  60 capsule    Refill:  0    Pt has been out x 5d, please call him and let him know this is ready asap. [place]

## 2018-04-12 NOTE — Telephone Encounter (Signed)
I talked with pt. Stated that pt need an office visit for refill Anafranil  and can see another provider if unable to schedule with his pcp Dr. Brigitte Pulse

## 2018-04-12 NOTE — Telephone Encounter (Signed)
Patient is calling back and is upset that it has been 5 days and he still has not heard anything from anyone. Please advise.

## 2018-04-13 ENCOUNTER — Other Ambulatory Visit: Payer: Self-pay | Admitting: Family Medicine

## 2018-04-18 ENCOUNTER — Ambulatory Visit: Payer: Medicare Other | Admitting: Family Medicine

## 2018-05-06 ENCOUNTER — Other Ambulatory Visit: Payer: Self-pay | Admitting: Family Medicine

## 2018-05-06 NOTE — Telephone Encounter (Signed)
Copied from [place] [phone]. Topic: Quick Communication - Rx Refill/Question >> May 06, 2018  5:37 PM Blase Mess A wrote: Medication:clomiPRAMINE (ANAFRANIL) 50 MG capsule [[id number]]  Appt scheduled 05/22/18 will run out on 05/08/18  Has the patient contacted their pharmacy? Yes  (Agent: If no, request that the patient contact the pharmacy for the refill.) (Agent: If yes, when and what did the pharmacy advise?)  Preferred Pharmacy (with phone number or street name): [id number] [address], [place] - [place] [phone] (Phone) [phone] (Fax)    Agent: Please be advised that RX refills may take up to 3 business days. We ask that you follow-up with your pharmacy.

## 2018-05-06 NOTE — Telephone Encounter (Signed)
Requested medication (s) are due for refill today: Yes  Requested medication (s) are on the active medication list: Yes  Last refill:  04/12/18  Future visit scheduled: Yes  Notes to clinic:  Unable to refill per protocol, patient is out of medication x 5 days, please send to another provider for refill.     Requested Prescriptions  Pending Prescriptions Disp Refills   clomiPRAMINE (ANAFRANIL) 50 MG capsule 60 capsule 0    Sig: Take 2 capsules (100 mg total) by mouth at bedtime. **NEED VISIT FOR ADDITIONAL REFILLS**     Psychiatry:  Antidepressants - Heterocyclics (TCAs) Failed - 05/06/2018  5:49 PM      Failed - Completed PHQ-2 or PHQ-9 in the last 360 days.      Failed - Valid encounter within last 6 months    Recent Outpatient Visits          1 year ago Marfan syndrome   [facility], Laurey Arrow, MD   2 years ago Marfan syndrome   [facility], Laurey Arrow, MD   3 years ago Anxiety state   [facility], Laurey Arrow, MD   5 years ago Tobacco use disorder   [facility], Laurey Arrow, MD   5 years ago Tobacco use disorder   [facility], Laurey Arrow, MD      Future Appointments            In 2 weeks Shawnee Knapp, MD Primary Care at [place], [facility]

## 2018-05-06 NOTE — Telephone Encounter (Signed)
Patient called and asked if he would like to come in to see another provider to receive the med refill requested, since Dr. Brigitte Pulse is out of the office. He says that's the reason he needs a refill is because his appointment was cancelled and he doesn't have the finances or transportation to come another time. He says he rescheduled for 05/22/18 with Dr. Brigitte Pulse. I advised she may not be back in the office at that time, so I will send to the office to see if another provider will refill it, he verbalized understanding.

## 2018-05-08 ENCOUNTER — Ambulatory Visit: Payer: Medicare Other | Admitting: Family Medicine

## 2018-05-09 ENCOUNTER — Ambulatory Visit: Payer: Medicare Other | Admitting: Family Medicine

## 2018-05-10 NOTE — Telephone Encounter (Signed)
Please Advise  Patient is requesting a refill of the following medications: Requested Prescriptions   Pending Prescriptions Disp Refills  . clomiPRAMINE (ANAFRANIL) 50 MG capsule 60 capsule 0    Sig: Take 2 capsules (100 mg total) by mouth at bedtime. **NEED VISIT FOR ADDITIONAL REFILLS**

## 2018-05-10 NOTE — Telephone Encounter (Signed)
Pt called to check status as he has been asked to RS appt with Dr. Brigitte Pulse again. He was scheduled 1/15 then 1/29 and having to reschedule again due to provider. Pt concerned with running out.   Please advise when sent in  [id number] [place] [place] [phone] (Phone) [phone] (Fax)

## 2018-05-13 MED ORDER — CLOMIPRAMINE HCL 50 MG PO CAPS: 100.0000 mg | ORAL_CAPSULE | Freq: Every day | ORAL | 0 refills | Status: DC

## 2018-05-13 NOTE — Telephone Encounter (Signed)
Last OV with PCP Dr Brigitte Pulse in June 2019 Stable on current regime for long time per note Med refilled Has appt with PCP Jun 06 2018

## 2018-05-13 NOTE — Telephone Encounter (Signed)
Pt has only one pill

## 2018-05-22 ENCOUNTER — Ambulatory Visit: Payer: Medicare Other | Admitting: Family Medicine

## 2018-06-03 ENCOUNTER — Other Ambulatory Visit: Payer: Self-pay | Admitting: Family Medicine

## 2018-06-03 NOTE — Telephone Encounter (Signed)
Dr. Brigitte Pulse,  I spoke with this pt to reschedule his appt with you on 06/06/18 that needed to be cancelled. He has rescheduled with you for 06/21/18 but would like to know if you can fill his ANAFRANIL for him until he can come in to see you .  Please advise.  Thank you!

## 2018-06-03 NOTE — Telephone Encounter (Signed)
Pt was called regarding appt scheduled for 06/06/18 with Dr. Brigitte Pulse. Due to Brigitte Pulse being on leave, pt needed to be rescheduled. I was able to reschedule for 06/21/18 with Brigitte Pulse at 1:00. I advised pt of time, building and late policy. Pt acknowledged.

## 2018-06-06 ENCOUNTER — Other Ambulatory Visit: Payer: Self-pay | Admitting: Family Medicine

## 2018-06-06 ENCOUNTER — Ambulatory Visit: Payer: Medicare Other | Admitting: Family Medicine

## 2018-06-06 NOTE — Telephone Encounter (Signed)
Pharmacy requested changes- sent for review

## 2018-06-10 ENCOUNTER — Other Ambulatory Visit: Payer: Self-pay | Admitting: Family Medicine

## 2018-06-10 MED ORDER — CLOMIPRAMINE HCL 50 MG PO CAPS: 100.0000 mg | ORAL_CAPSULE | Freq: Every day | ORAL | 0 refills | Status: DC

## 2018-06-10 NOTE — Telephone Encounter (Signed)
Sent in refill to get pt to 06/21/2018 OV

## 2018-06-10 NOTE — Telephone Encounter (Signed)
Copied from [place] [phone]. Topic: Quick Communication - Rx Refill/Question >> Jun 10, 2018 10:55 AM Gustavus Messing wrote: Medication: clomiPRAMINE (ANAFRANIL) 50 MG capsule [[id number]]   Has the patient contacted their pharmacy? Yes.    (Agent: If yes, when and what did the pharmacy advise?) Needs a med refill because he only has 3 days of medication left  Preferred Pharmacy (with phone number or street name): [id number] [address], [place] - [place] [phone] (Phone) [phone] (Fax)   Agent: Please be advised that RX refills may take up to 3 business days. We ask that you follow-up with your pharmacy.

## 2018-06-18 ENCOUNTER — Other Ambulatory Visit: Payer: Self-pay | Admitting: Family Medicine

## 2018-06-18 ENCOUNTER — Ambulatory Visit: Payer: Medicare Other | Admitting: Family Medicine

## 2018-06-21 ENCOUNTER — Ambulatory Visit: Payer: Medicare Other | Admitting: Family Medicine

## 2018-06-24 ENCOUNTER — Telehealth: Payer: Self-pay | Admitting: Family Medicine

## 2018-06-24 NOTE — Telephone Encounter (Signed)
Tried to call patient to let them know Dr. Brigitte Pulse is no longer at [place] at [facility] and that their appointment with her is going to be cancelled.  LVM for patient, if patient calls back, please try to get them rescheduled with a different provider or let them know that Dr Brigitte Pulse is going to be working at [employer] the number there is [phone].

## 2018-07-03 ENCOUNTER — Ambulatory Visit: Payer: Medicare Other | Admitting: Family Medicine

## 2018-07-04 ENCOUNTER — Ambulatory Visit (INDEPENDENT_AMBULATORY_CARE_PROVIDER_SITE_OTHER): Payer: Medicare Other | Admitting: Family Medicine

## 2018-07-04 ENCOUNTER — Encounter: Payer: Self-pay | Admitting: Family Medicine

## 2018-07-04 ENCOUNTER — Other Ambulatory Visit: Payer: Self-pay

## 2018-07-04 VITALS — BP 117/82 | HR 100 | Temp 98.8°F | Ht 76.0 in | Wt 200.0 lb

## 2018-07-04 DIAGNOSIS — F411 Generalized anxiety disorder: Secondary | ICD-10-CM

## 2018-07-04 DIAGNOSIS — F1011 Alcohol abuse, in remission: Secondary | ICD-10-CM

## 2018-07-04 DIAGNOSIS — E785 Hyperlipidemia, unspecified: Secondary | ICD-10-CM | POA: Diagnosis not present

## 2018-07-04 DIAGNOSIS — F3341 Major depressive disorder, recurrent, in partial remission: Secondary | ICD-10-CM | POA: Diagnosis not present

## 2018-07-04 DIAGNOSIS — Q8740 Marfan's syndrome, unspecified: Secondary | ICD-10-CM

## 2018-07-04 MED ORDER — ESCITALOPRAM OXALATE 10 MG PO TABS: 10.0000 mg | ORAL_TABLET | Freq: Every day | ORAL | 3 refills | Status: DC

## 2018-07-04 MED ORDER — ANAFRANIL 50 MG PO CAPS: 100.0000 mg | ORAL_CAPSULE | Freq: Every day | ORAL | 11 refills | Status: DC

## 2018-07-04 NOTE — Patient Instructions (Signed)
° ° ° °  If you have lab work done today you will be contacted with your lab results within the next 2 weeks.  If you have not heard from us then please contact us. The fastest way to get your results is to register for My Chart. ° ° °IF you received an x-ray today, you will receive an invoice from [facility]. Please contact [facility] at [phone] with questions or concerns regarding your invoice.  ° °IF you received labwork today, you will receive an invoice from [facility]. Please contact [facility] at [phone] with questions or concerns regarding your invoice.  ° °Our billing staff will not be able to assist you with questions regarding bills from these companies. ° °You will be contacted with the lab results as soon as they are available. The fastest way to get your results is to activate your My Chart account. Instructions are located on the last page of this paperwork. If you have not heard from us regarding the results in 2 weeks, please contact this office. °  ° ° ° °

## 2018-07-04 NOTE — Progress Notes (Signed)
3/12/20204:08 PM  Christopher Dawson 1956/03/09, 63 y.o. male [id number]  Chief Complaint  Patient presents with  . Medication Refill    brand name only of anafranil 50 mg    HPI:   Patient is a 63 y.o. male with past medical history significant for marfarn syndrome, OCD, depression, alcoholism who presents today to re-establish care  Last OV sept 2018 Previous PCP Dr Brigitte Pulse Used to see her once a year  Has been on anafranil for years, gets it thru patient assistance program Really helps with anxiety, somewhat helps with depression In the past, previous provider thought about starting lexapro Does not have part D, finances are an issue Drinks about 3 beers a day, used to be a heavy drinker  Still smokes, working on cutting back Echo in jan 2019:  Left ventricle: The cavity size was normal. There was moderate   concentric hypertrophy. Systolic function was vigorous. The   estimated ejection fraction was in the range of 65% to 70%. Wall   motion was normal; there were no regional wall motion   abnormalities. The tissue Doppler parameters were normal. Left   ventricular diastolic function parameters were normal. - Aortic valve: Valve area (Vmax): 3.01 cm^2. - Aortic root: The aortic root was normal in size. - Right ventricle: Systolic function was normal. - Right atrium: The atrium was normal in size. - Pulmonary arteries: Systolic pressure was within the normal   range. - Inferior vena cava: The vessel was normal in size. The   respirophasic diameter changes were in the normal range (>= 50%),   consistent with normal central venous pressure. - Pericardium, extracardiac: There was no pericardial effusion.  GAD 7 : Generalized Anxiety Score 07/04/2018  Nervous, Anxious, on Edge 1  Control/stop worrying 1  Worry too much - different things 1  Trouble relaxing 1  Restless 0  Easily annoyed or irritable 1  Afraid - awful might happen 1  Total GAD 7 Score 6  Anxiety Difficulty  Somewhat difficult    Fall Risk  07/04/2018 12/28/2016 10/07/2015  Falls in the past year? 0 No No  Number falls in past yr: 0 - -  Injury with Fall? 0 - -     Depression screen [facility] 2/9 07/04/2018 12/28/2016 10/07/2015  Decreased Interest 3 3 2   Down, Depressed, Hopeless 2 3 2   PHQ - 2 Score 5 6 4   Altered sleeping 1 0 0  Tired, decreased energy 3 2 0  Change in appetite 1 0 0  Feeling bad or failure about yourself  1 - 0  Trouble concentrating 0 1 0  Moving slowly or fidgety/restless 0 0 0  Suicidal thoughts 0 0 0  PHQ-9 Score 11 9 4   Difficult doing work/chores Somewhat difficult - -    Allergies  Allergen Reactions  . Codeine Other (See Comments)    "spaced out"    Prior to Admission medications   Medication Sig Start Date End Date Taking? Authorizing Provider  acetaminophen (TYLENOL) 325 MG tablet Take 2 tablets (650 mg total) by mouth every 6 (six) hours as needed for mild pain or headache (or Fever >/= 101). 05/13/17  Yes Barton Dubois, MD  clomiPRAMINE (ANAFRANIL) 50 MG capsule Take 2 capsules (100 mg total) by mouth at bedtime. Keep upcoming office visit. 06/10/18  Yes Shawnee Knapp, MD  folic acid (FOLVITE) 1 MG tablet Take 1 tablet (1 mg total) by mouth daily. 05/14/17  Yes Barton Dubois, MD  guaiFENesin [facility]) 600  MG 12 hr tablet Take 1 tablet (600 mg total) by mouth 2 (two) times daily. 05/13/17  Yes Barton Dubois, MD  oseltamivir (TAMIFLU) 75 MG capsule Take 1 capsule (75 mg total) by mouth 2 (two) times daily. 05/13/17  Yes Barton Dubois, MD  thiamine 100 MG tablet Take 1 tablet (100 mg total) by mouth daily. 05/14/17  Yes Barton Dubois, MD  chlordiazePOXIDE (LIBRIUM) 5 MG capsule 2 tablet by mouth twice a day for 2 days; then 1 tablet twice a day for two days; then 1 tablet daily for three days and then stop librium. Patient not taking: Reported on 07/04/2018 05/13/17   Barton Dubois, MD    Past Medical History:  Diagnosis Date  . Anxiety   . Depression   . Lumbar  radiculopathy, chronic 2008  . Marfan syndrome   . Neurosis, depressive   . Shortening, leg, congenital     Past Surgical History:  Procedure Laterality Date  . LEG SURGERY      Social History   Tobacco Use  . Smoking status: Current Every Day Smoker  . Smokeless tobacco: Never Used  Substance Use Topics  . Alcohol use: Yes    Family History  Problem Relation Age of Onset  . Heart disease Mother     Review of Systems  Constitutional: Negative for chills and fever.  Respiratory: Negative for cough and shortness of breath.   Cardiovascular: Negative for chest pain, palpitations and leg swelling.  Gastrointestinal: Negative for abdominal pain, nausea and vomiting.     OBJECTIVE:  Blood pressure 117/82, pulse 100, temperature 98.8 F (37.1 C), temperature source Oral, height 6\' 4"  (1.93 m), weight 200 lb (90.7 kg), SpO2 97 %. Body mass index is 24.34 kg/m.   Physical Exam Vitals signs and nursing note reviewed.  Constitutional:      Appearance: He is well-developed.  HENT:     Head: Normocephalic and atraumatic.  Eyes:     Conjunctiva/sclera: Conjunctivae normal.     Pupils: Pupils are equal, round, and reactive to light.  Neck:     Musculoskeletal: Neck supple.  Cardiovascular:     Rate and Rhythm: Normal rate and regular rhythm.     Heart sounds: No murmur. No friction rub. No gallop.   Pulmonary:     Effort: Pulmonary effort is normal.     Breath sounds: Normal breath sounds. No wheezing or rales.  Skin:    General: Skin is warm and dry.  Neurological:     Mental Status: He is alert and oriented to person, place, and time.     ASSESSMENT and PLAN  1. Marfan syndrome 2. Recurrent major depressive disorder, in partial remission ([place]) 3. GAD (generalized anxiety disorder) Uncontrolled. Discussed [facility] $4 formulary, lexapro on it. Patient would like to start as discussed with previous PCP. Reviewed meds r/se/b. Refilled TCA.   4. Hyperlipidemia,  unspecified hyperlipidemia type Checking labs today, medications will be adjusted as needed.  - Lipid panel  5. H/O alcohol abuse - CBC with Differential/Platelet - Comprehensive metabolic panel  Other orders - ANAFRANIL 50 MG capsule; Take 2 capsules (100 mg total) by mouth at bedtime. - escitalopram (LEXAPRO) 10 MG tablet; Take 1 tablet (10 mg total) by mouth daily.  Return in about 3 months (around 10/04/2018).    Rutherford Guys, MD Primary Care at [facility] [facility], [place] [address] Ph.  [phone] Fax [phone]

## 2018-07-05 LAB — COMPREHENSIVE METABOLIC PANEL
ALT: 13 IU/L (ref 0–44)
AST: 14 IU/L (ref 0–40)
Albumin/Globulin Ratio: 2.5 — ABNORMAL HIGH (ref 1.2–2.2)
Albumin: 4.7 g/dL (ref 3.8–4.8)
Alkaline Phosphatase: 77 IU/L (ref 39–117)
BUN/Creatinine Ratio: 16 (ref 10–24)
BUN: 13 mg/dL (ref 8–27)
Bilirubin Total: <0.2 mg/dL (ref 0.0–1.2)
CO2: 21 mmol/L (ref 20–29)
Calcium: 9.4 mg/dL (ref 8.6–10.2)
Chloride: 107 mmol/L — ABNORMAL HIGH (ref 96–106)
Creatinine, Ser: 0.79 mg/dL (ref 0.76–1.27)
GFR calc Af Amer: 110 mL/min/{1.73_m2} (ref >59)
GFR calc non Af Amer: 96 mL/min/{1.73_m2} (ref >59)
Globulin, Total: 1.9 g/dL (ref 1.5–4.5)
Glucose: 94 mg/dL (ref 65–99)
Potassium: 4.1 mmol/L (ref 3.5–5.2)
Sodium: 146 mmol/L — ABNORMAL HIGH (ref 134–144)
Total Protein: 6.6 g/dL (ref 6.0–8.5)

## 2018-07-05 LAB — CBC WITH DIFFERENTIAL/PLATELET
Basophils Absolute: 0.1 10*3/uL (ref 0.0–0.2)
Basos: 1 %
EOS (ABSOLUTE): 0.3 10*3/uL (ref 0.0–0.4)
Eos: 3 %
Hematocrit: 49.2 % (ref 37.5–51.0)
Hemoglobin: 17.3 g/dL (ref 13.0–17.7)
Immature Grans (Abs): 0 10*3/uL (ref 0.0–0.1)
Immature Granulocytes: 0 %
Lymphocytes Absolute: 2.2 10*3/uL (ref 0.7–3.1)
Lymphs: 22 %
MCH: 31.6 pg (ref 26.6–33.0)
MCHC: 35.2 g/dL (ref 31.5–35.7)
MCV: 90 fL (ref 79–97)
Monocytes Absolute: 0.9 10*3/uL (ref 0.1–0.9)
Monocytes: 9 %
Neutrophils Absolute: 6.3 10*3/uL (ref 1.4–7.0)
Neutrophils: 65 %
Platelets: 272 10*3/uL (ref 150–450)
RBC: 5.47 x10E6/uL (ref 4.14–5.80)
RDW: 12.8 % (ref 11.6–15.4)
WBC: 9.7 10*3/uL (ref 3.4–10.8)

## 2018-07-05 LAB — LIPID PANEL
Chol/HDL Ratio: 4.5 ratio (ref 0.0–5.0)
Cholesterol, Total: 176 mg/dL (ref 100–199)
HDL: 39 mg/dL — ABNORMAL LOW (ref >39)
LDL Calculated: 104 mg/dL — ABNORMAL HIGH (ref 0–99)
Triglycerides: 167 mg/dL — ABNORMAL HIGH (ref 0–149)
VLDL Cholesterol Cal: 33 mg/dL (ref 5–40)

## 2018-07-15 ENCOUNTER — Other Ambulatory Visit: Payer: Self-pay | Admitting: Family Medicine

## 2018-07-16 ENCOUNTER — Ambulatory Visit: Payer: Medicare Other | Admitting: Family Medicine

## 2018-07-17 ENCOUNTER — Encounter: Payer: Self-pay | Admitting: Radiology

## 2018-10-02 ENCOUNTER — Ambulatory Visit: Payer: Medicare Other | Admitting: Family Medicine

## 2018-12-20 ENCOUNTER — Other Ambulatory Visit: Payer: Self-pay | Admitting: Family Medicine

## 2018-12-20 NOTE — Telephone Encounter (Signed)
   Pt has not been seen since 07/04/2018 and NO SHOWED his 10/08/18 appt  No future appointment scheduled and he had been given a 1 year supply of meds in 07/04/18

## 2018-12-20 NOTE — Telephone Encounter (Signed)
Requested medication (s) are due for refill today: yes Requested medication (s) are on the active medication list: yes  Last refill:  12/12/18  Future visit scheduled: no  Notes to clinic: [place]   Requested Prescriptions  Pending Prescriptions Disp Refills   ANAFRANIL 50 MG capsule [Pharmacy Med Name: ANAFRANIL 50 MG CAPSULE] 180 capsule 4    Sig: TAKE 2 CAPSULES BY MOUTH AT BEDTIME     Psychiatry:  Antidepressants - Heterocyclics (TCAs) Passed - 12/20/2018  8:34 AM      Passed - Valid encounter within last 6 months    Recent Outpatient Visits          5 months ago Marfan syndrome   [facility] at [facility], Lilia Argue, MD   1 year ago Marfan syndrome   [facility] at [facility], Laurey Arrow, MD   3 years ago Marfan syndrome   [facility] at [facility], Laurey Arrow, MD   4 years ago Anxiety state   [facility] at [facility], Laurey Arrow, MD   5 years ago Tobacco use disorder   [facility] at [facility], Laurey Arrow, MD             Passed - Completed PHQ-2 or PHQ-9 in the last 360 days.

## 2019-04-15 ENCOUNTER — Telehealth: Payer: Self-pay | Admitting: *Deleted

## 2019-04-15 NOTE — Telephone Encounter (Signed)
Schedule AWV.  

## 2019-06-29 ENCOUNTER — Encounter (HOSPITAL_COMMUNITY): Payer: Self-pay

## 2019-06-29 ENCOUNTER — Other Ambulatory Visit: Payer: Self-pay

## 2019-06-29 ENCOUNTER — Emergency Department (HOSPITAL_COMMUNITY)
Admission: EM | Admit: 2019-06-29 | Discharge: 2019-06-30 | Disposition: A | Discharge status: Home / Self Care | Payer: Medicare Other | Attending: Emergency Medicine | Admitting: Emergency Medicine

## 2019-06-29 ENCOUNTER — Emergency Department (HOSPITAL_COMMUNITY): Payer: Medicare Other

## 2019-06-29 DIAGNOSIS — Z23 Encounter for immunization: Secondary | ICD-10-CM | POA: Insufficient documentation

## 2019-06-29 DIAGNOSIS — R410 Disorientation, unspecified: Secondary | ICD-10-CM | POA: Diagnosis not present

## 2019-06-29 DIAGNOSIS — T300 Burn of unspecified body region, unspecified degree: Secondary | ICD-10-CM | POA: Diagnosis not present

## 2019-06-29 DIAGNOSIS — R918 Other nonspecific abnormal finding of lung field: Secondary | ICD-10-CM | POA: Diagnosis not present

## 2019-06-29 DIAGNOSIS — T59814A Toxic effect of smoke, undetermined, initial encounter: Secondary | ICD-10-CM | POA: Diagnosis not present

## 2019-06-29 DIAGNOSIS — Y904 Blood alcohol level of 80-99 mg/100 ml: Secondary | ICD-10-CM | POA: Diagnosis not present

## 2019-06-29 DIAGNOSIS — X000XXA Exposure to flames in uncontrolled fire in building or structure, initial encounter: Secondary | ICD-10-CM | POA: Diagnosis not present

## 2019-06-29 DIAGNOSIS — Y929 Unspecified place or not applicable: Secondary | ICD-10-CM | POA: Insufficient documentation

## 2019-06-29 DIAGNOSIS — R000 Tachycardia, unspecified: Secondary | ICD-10-CM | POA: Diagnosis not present

## 2019-06-29 DIAGNOSIS — T22221A Burn of second degree of right elbow, initial encounter: Secondary | ICD-10-CM | POA: Diagnosis not present

## 2019-06-29 DIAGNOSIS — F17200 Nicotine dependence, unspecified, uncomplicated: Secondary | ICD-10-CM | POA: Diagnosis not present

## 2019-06-29 DIAGNOSIS — T22211A Burn of second degree of right forearm, initial encounter: Secondary | ICD-10-CM | POA: Diagnosis not present

## 2019-06-29 DIAGNOSIS — F10920 Alcohol use, unspecified with intoxication, uncomplicated: Secondary | ICD-10-CM

## 2019-06-29 DIAGNOSIS — T2220XA Burn of second degree of shoulder and upper limb, except wrist and hand, unspecified site, initial encounter: Secondary | ICD-10-CM

## 2019-06-29 DIAGNOSIS — Y93G3 Activity, cooking and baking: Secondary | ICD-10-CM | POA: Insufficient documentation

## 2019-06-29 DIAGNOSIS — Y999 Unspecified external cause status: Secondary | ICD-10-CM | POA: Insufficient documentation

## 2019-06-29 DIAGNOSIS — R0902 Hypoxemia: Secondary | ICD-10-CM | POA: Diagnosis not present

## 2019-06-29 DIAGNOSIS — F10120 Alcohol abuse with intoxication, uncomplicated: Secondary | ICD-10-CM | POA: Insufficient documentation

## 2019-06-29 LAB — CBC WITH DIFFERENTIAL/PLATELET
Abs Immature Granulocytes: 0.05 10*3/uL (ref 0.00–0.07)
Basophils Absolute: 0 10*3/uL (ref 0.0–0.1)
Basophils Relative: 0 %
Eosinophils Absolute: 0.1 10*3/uL (ref 0.0–0.5)
Eosinophils Relative: 1 %
HCT: 46.4 % (ref 39.0–52.0)
Hemoglobin: 15.5 g/dL (ref 13.0–17.0)
Immature Granulocytes: 1 %
Lymphocytes Relative: 13 %
Lymphs Abs: 1.3 10*3/uL (ref 0.7–4.0)
MCH: 32.1 pg (ref 26.0–34.0)
MCHC: 33.4 g/dL (ref 30.0–36.0)
MCV: 96.1 fL (ref 80.0–100.0)
Monocytes Absolute: 0.5 10*3/uL (ref 0.1–1.0)
Monocytes Relative: 6 %
Neutro Abs: 7.7 10*3/uL (ref 1.7–7.7)
Neutrophils Relative %: 79 %
Platelets: 116 10*3/uL — ABNORMAL LOW (ref 150–400)
RBC: 4.83 MIL/uL (ref 4.22–5.81)
RDW: 13.1 % (ref 11.5–15.5)
WBC: 9.7 10*3/uL (ref 4.0–10.5)
nRBC: 0 % (ref 0.0–0.2)

## 2019-06-29 LAB — COMPREHENSIVE METABOLIC PANEL
ALT: 16 U/L (ref 0–44)
AST: 20 U/L (ref 15–41)
Albumin: 3.6 g/dL (ref 3.5–5.0)
Alkaline Phosphatase: 70 U/L (ref 38–126)
Anion gap: 12 (ref 5–15)
BUN: 9 mg/dL (ref 8–23)
CO2: 21 mmol/L — ABNORMAL LOW (ref 22–32)
Calcium: 8.4 mg/dL — ABNORMAL LOW (ref 8.9–10.3)
Chloride: 106 mmol/L (ref 98–111)
Creatinine, Ser: 0.72 mg/dL (ref 0.61–1.24)
GFR calc Af Amer: >60 mL/min (ref >60)
GFR calc non Af Amer: >60 mL/min (ref >60)
Glucose, Bld: 110 mg/dL — ABNORMAL HIGH (ref 70–99)
Potassium: 4 mmol/L (ref 3.5–5.1)
Sodium: 139 mmol/L (ref 135–145)
Total Bilirubin: 0.6 mg/dL (ref 0.3–1.2)
Total Protein: 6 g/dL — ABNORMAL LOW (ref 6.5–8.1)

## 2019-06-29 LAB — ETHANOL: Alcohol, Ethyl (B): 92 mg/dL — ABNORMAL HIGH (ref <10)
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
## 2019-06-29 MED ORDER — TETANUS-DIPHTH-ACELL PERTUSSIS 5-2.5-18.5 LF-MCG/0.5 IM SUSP
0.5000 mL | Freq: Once | INTRAMUSCULAR | Status: AC
  Administered 2019-06-29: 22:00:00 0.5 mL via INTRAMUSCULAR
  Filled 2019-06-29: qty 0.5

## 2019-06-29 MED ORDER — FENTANYL CITRATE (PF) 100 MCG/2ML IJ SOLN: 50.0000 ug | INTRAMUSCULAR | Status: DC | PRN

## 2019-06-29 MED ORDER — SILVER SULFADIAZINE 1 % EX CREA
TOPICAL_CREAM | Freq: Once | CUTANEOUS | Status: AC
  Filled 2019-06-29: qty 85

## 2019-06-29 MED ORDER — SODIUM CHLORIDE 0.9 % IV BOLUS
500.0000 mL | Freq: Once | INTRAVENOUS | Status: AC
  Administered 2019-06-29: 22:00:00 500 mL via INTRAVENOUS

## 2019-06-29 MED ORDER — ACETAMINOPHEN 500 MG PO TABS
1000.0000 mg | ORAL_TABLET | Freq: Once | ORAL | Status: AC
  Administered 2019-06-29: 22:00:00 1000 mg via ORAL
  Filled 2019-06-29: qty 2

## 2019-06-29 MED ORDER — SODIUM CHLORIDE 0.9 % IV BOLUS
500.0000 mL | Freq: Once | INTRAVENOUS | Status: AC
  Administered 2019-06-29: 500 mL via INTRAVENOUS

## 2019-06-29 NOTE — Discharge Instructions (Addendum)
Keep wounds clean, apply antibiotic cream twice daily. Return for signs of infection. Tylenol and motrin for pain, ice as needed or cool water on wounds.    [employer] [phone]
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
## 2019-06-29 NOTE — ED Notes (Signed)
Called lab to verify tube color for carbon monoxide. [place]
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
## 2019-06-30 MED ORDER — LORAZEPAM 1 MG PO TABS
1.0000 mg | ORAL_TABLET | Freq: Once | ORAL | Status: AC
  Administered 2019-06-30: 07:00:00 1 mg via ORAL
  Filled 2019-06-30: qty 1

## 2019-06-30 MED ORDER — CLOMIPRAMINE HCL 25 MG PO CAPS: 100.0000 mg | ORAL_CAPSULE | Freq: Every day | ORAL | Status: DC

## 2019-06-30 MED ORDER — SILVER SULFADIAZINE 1 % EX CREA: 1.0000 "application " | TOPICAL_CREAM | Freq: Every day | CUTANEOUS | 0 refills | Status: DC
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
## 2019-06-30 NOTE — ED Notes (Signed)
Report given to [facility] RN

## 2019-06-30 NOTE — ED Notes (Signed)
Pt resting quietly with eyes closed; rise and fall of chest visible. Will continue to monitor.

## 2019-06-30 NOTE — ED Notes (Signed)
Breakfast ordered 
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
## 2019-07-01 LAB — CARBON MONOXIDE, BLOOD (PERFORMED AT REF LAB): Carbon Monoxide, Blood: 10.1 % — ABNORMAL HIGH (ref 0.0–3.6)

## 2019-07-08 ENCOUNTER — Other Ambulatory Visit: Payer: Self-pay | Admitting: Family Medicine
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
## 2019-07-08 NOTE — Telephone Encounter (Signed)
Patient is requesting a refill of the following medications: Requested Prescriptions    No prescriptions requested or ordered in this encounter

## 2019-07-09 NOTE — Telephone Encounter (Signed)
Patient is requesting a refill of the following medications: Requested Prescriptions   Pending Prescriptions Disp Refills  . LORazepam (ATIVAN) 1 MG tablet 60 tablet 1    Sig: Take 0.5-1 tablets (0.5-1 mg total) by mouth 2 (two) times daily as needed for anxiety.    Date of patient request:  Last office visit: 07/04/2018 Date of last refill: 05/07/2017 Last refill amount: 60 Follow up time period per chart: N/A

## 2019-07-10 ENCOUNTER — Telehealth: Payer: Self-pay | Admitting: Family Medicine

## 2019-07-10 NOTE — Telephone Encounter (Signed)
Patient is calling states that he called in yesterday asking for a prescription for Lorazepam for his anxiety , tried to get him a virtual appt pt would not commit .patient is very anxious pt has been on adavan for years
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
## 2019-07-11 NOTE — Telephone Encounter (Signed)
I have attempted to call pt no answer so I left a message to call back.    Pt will NOT be able to get RX w/o an Appt. Pt has not been seen in a year and this medication is not on his current med list.   Please assist in scheduling appt if pt want to have meds prescribed or filled.  We can not guarantee that it will be sent in, he will have to talk to provider about it since it is considered controlled and it is not on his med list.

## 2019-07-14 NOTE — Telephone Encounter (Signed)
Please advise on message below.   What is the recommended way to proceed?

## 2019-07-15 NOTE — Telephone Encounter (Signed)
Refill request denied. ER note reviewed, report alcohol use  with jitteriness. Pmp reviewed. No rx last year.

## 2019-07-17 ENCOUNTER — Telehealth (INDEPENDENT_AMBULATORY_CARE_PROVIDER_SITE_OTHER): Payer: Medicare Other | Admitting: Family Medicine

## 2019-07-17 ENCOUNTER — Other Ambulatory Visit: Payer: Self-pay

## 2019-07-17 DIAGNOSIS — F430 Acute stress reaction: Secondary | ICD-10-CM | POA: Diagnosis not present

## 2019-07-17 MED ORDER — LORAZEPAM 0.5 MG PO TABS: 0.5000 mg | ORAL_TABLET | Freq: Every day | ORAL | 0 refills | Status: DC | PRN
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
## 2019-07-19 ENCOUNTER — Other Ambulatory Visit: Payer: Self-pay | Admitting: Family Medicine

## 2019-07-19 NOTE — Telephone Encounter (Signed)
Requested Prescriptions  Pending Prescriptions Disp Refills  . ANAFRANIL 50 MG capsule [Pharmacy Med Name: ANAFRANIL 50 MG CAPSULE] 180 capsule 1    Sig: TAKE 2 CAPSULES BY MOUTH AT BEDTIME     Psychiatry:  Antidepressants - Heterocyclics (TCAs) Failed - 07/19/2019 11:46 AM      Failed - Completed PHQ-2 or PHQ-9 in the last 360 days.      Passed - Valid encounter within last 6 months    Recent Outpatient Visits          2 days ago Acute stress reaction   [facility], Lilia Argue, MD   1 year ago Marfan syndrome   [facility], Lilia Argue, MD   2 years ago Marfan syndrome   [facility], Laurey Arrow, MD   3 years ago Marfan syndrome   [facility], Laurey Arrow, MD   5 years ago Anxiety state   [facility], Laurey Arrow, MD

## 2019-07-21 ENCOUNTER — Other Ambulatory Visit: Payer: Self-pay | Admitting: Family Medicine

## 2019-07-21 MED ORDER — ANAFRANIL 50 MG PO CAPS: 100.0000 mg | ORAL_CAPSULE | Freq: Every day | ORAL | 1 refills | Status: DC

## 2019-07-21 NOTE — Telephone Encounter (Signed)
Medication Refill - Medication: ANAFRANIL 50 MG capsule [[id number]]     Preferred Pharmacy (with phone number or street name):  [id number] [address], [address]  [place] [address]  Phone: [phone] Fax: [phone]     Agent: Please be advised that RX refills may take up to 3 business days. We ask that you follow-up with your pharmacy.

## 2019-07-21 NOTE — Telephone Encounter (Signed)
Patient is calling to check on the medication refill. Advised patient of three day turnaround. Patient states the pharmacy only gave him a few pills

## 2019-07-21 NOTE — Telephone Encounter (Signed)
Per inititial encounter, "Patient is calling to check on the medication refill. Advised patient of three day turnaround. Patient states the pharmacy only gave him a few pills; Pt refill request for Anafranill; last refill 07/17/19, #90; spoke with Adonis Huguenin, Pharmacist and she states the pt got an emergency release of 6 because they had not received a prescription request; will resend prescription.

## 2019-07-21 NOTE — Telephone Encounter (Signed)
Requested Prescriptions  Pending Prescriptions Disp Refills  . ANAFRANIL 50 MG capsule 180 capsule 1    Sig: Take 2 capsules (100 mg total) by mouth at bedtime.     Psychiatry:  Antidepressants - Heterocyclics (TCAs) Failed - 07/21/2019  3:31 PM      Failed - Completed PHQ-2 or PHQ-9 in the last 360 days.      Passed - Valid encounter within last 6 months    Recent Outpatient Visits          4 days ago Acute stress reaction   [facility], Lilia Argue, MD   1 year ago Marfan syndrome   [facility], Lilia Argue, MD   2 years ago Marfan syndrome   [facility], Laurey Arrow, MD   3 years ago Marfan syndrome   [facility], Laurey Arrow, MD   5 years ago Anxiety state   [facility], MD             Refused Prescriptions Disp Refills  . ANAFRANIL 50 MG capsule 180 capsule 1    Sig: Take 2 capsules (100 mg total) by mouth at bedtime.     Psychiatry:  Antidepressants - Heterocyclics (TCAs) Failed - 07/21/2019  3:31 PM      Failed - Completed PHQ-2 or PHQ-9 in the last 360 days.      Passed - Valid encounter within last 6 months    Recent Outpatient Visits          4 days ago Acute stress reaction   [facility], Lilia Argue, MD   1 year ago Marfan syndrome   [facility], Lilia Argue, MD   2 years ago Marfan syndrome   [facility], Laurey Arrow, MD   3 years ago Marfan syndrome   [facility], Laurey Arrow, MD   5 years ago Anxiety state   [facility], Laurey Arrow, MD

## 2019-07-21 NOTE — Addendum Note (Signed)
Addended by: Addison Naegeli on: 07/21/2019 03:31 PM   Modules accepted: Orders

## 2019-07-30 ENCOUNTER — Other Ambulatory Visit: Payer: Self-pay | Admitting: Family Medicine

## 2019-07-30 NOTE — Telephone Encounter (Signed)
Pharmacy is requesting changes to original Rx- sent  Please Advise  Patient is requesting a refill of the following medications: Requested Prescriptions   Pending Prescriptions Disp Refills  . ANAFRANIL 50 MG capsule [Pharmacy Med Name: ANAFRANIL 50 MG CAPSULE] 180 capsule 2    Sig: TAKE 2 CAPSULES BY MOUTH AT BEDTIME    Date of patient request: 07/30/2019 Last office visit: 07/21/2019 Date of last refill: 07/21/2019 Last refill amount: 180 tablets  Follow up time period per chart:N/a

## 2019-07-30 NOTE — Telephone Encounter (Signed)
Pharmacy is requesting changes to original Rx- sent for PCP review of request

## 2019-08-10 ENCOUNTER — Other Ambulatory Visit: Payer: Self-pay | Admitting: Family Medicine

## 2019-08-25 ENCOUNTER — Other Ambulatory Visit: Payer: Self-pay | Admitting: Family Medicine

## 2019-09-24 ENCOUNTER — Ambulatory Visit: Payer: Medicare Other

## 2019-09-29 ENCOUNTER — Telehealth: Payer: Self-pay | Admitting: Family Medicine

## 2019-09-29 NOTE — Telephone Encounter (Signed)
I think he is needing an AWV. Please advise at [phone].

## 2019-09-29 NOTE — Telephone Encounter (Signed)
Pt needs appt for physical

## 2019-11-01 DIAGNOSIS — Z23 Encounter for immunization: Secondary | ICD-10-CM | POA: Diagnosis not present

## 2019-11-30 DIAGNOSIS — Z23 Encounter for immunization: Secondary | ICD-10-CM | POA: Diagnosis not present

## 2020-05-07 ENCOUNTER — Other Ambulatory Visit: Payer: Self-pay | Admitting: Family Medicine

## 2020-05-07 ENCOUNTER — Other Ambulatory Visit: Payer: Self-pay | Admitting: Emergency Medicine

## 2020-05-07 ENCOUNTER — Telehealth: Payer: Self-pay

## 2020-05-07 DIAGNOSIS — F3341 Major depressive disorder, recurrent, in partial remission: Secondary | ICD-10-CM

## 2020-05-07 MED ORDER — ANAFRANIL 50 MG PO CAPS: 100.0000 mg | ORAL_CAPSULE | Freq: Every day | ORAL | 0 refills | Status: DC

## 2020-05-07 NOTE — Telephone Encounter (Signed)
Copied from [place] [id number]. Topic: Quick Communication - Rx Refill/Question >> May 07, 2020  9:01 AM Tessa Lerner A wrote: Medication: ANAFRANIL   Has the patient contacted their pharmacy? Yes - patient reached out to pharmacy who told patient that they were unable to reach PCP  Preferred Pharmacy (with phone number or street name): [id number] [place] [place] Phone: [phone]  Agent: Please be advised that RX refills may take up to 3 business days. We ask that you follow-up with your pharmacy.

## 2020-05-07 NOTE — Telephone Encounter (Signed)
Pt. Called requesting medication prescribed be changed to generic clomipramine.

## 2020-05-07 NOTE — Telephone Encounter (Signed)
Requested medications are due for refill today.  yes  Requested medications are on the active medications list.  yes  Last refill. 08/09/2019 #180 2 refills  Future visit scheduled.   No  Notes to clinic.  Called Christopher Dawson, he adamantly requests that this medication is refilled today as he is out.  Informed Pt that Dr. Pamella Pert is no longer with [facility] and that he has not had an appointment for nearly 2 years.  Pt states that he is afraid of getting COVID and so has not made appointment. Tried unsuccessfully to transfer the call into office scheduling. Pt states he will call and make an appointment to establish with another PCP at [facility]. Pt asks that another PCP at [facility] refill his Rx to last him until his new appointment.

## 2020-05-07 NOTE — Telephone Encounter (Signed)
Pt states his insurance company only covers generic for the ANAFRANIL 50 MG capsule (his old insurance only covered brand name)   Pt needs the generic called into the pharmacy clomipramine. Pt states it is very important he get this med today.  He is not supposed to go off of it.  [id number] [place], [place]

## 2020-05-07 NOTE — Telephone Encounter (Signed)
   Notes to clinic: states his insurance company only covers generic for the ANAFRANIL 50 MG capsule (his old insurance only covered brand name   Requested Prescriptions  Pending Prescriptions Disp Refills   ANAFRANIL 50 MG capsule 180 capsule 2    Sig: Take 2 capsules (100 mg total) by mouth at bedtime.      Psychiatry:  Antidepressants - Heterocyclics (TCAs) Failed - 05/07/2020  3:02 PM      Failed - Completed PHQ-2 or PHQ-9 in the last 360 days      Failed - Valid encounter within last 6 months    Recent Outpatient Visits           9 months ago Acute stress reaction   Primary Care at [facility], Lilia Argue, MD   1 year ago Marfan syndrome   Primary Care at [facility], Lilia Argue, MD   3 years ago Marfan syndrome   [facility] at [facility], Laurey Arrow, MD   4 years ago Marfan syndrome   [facility] at [facility], Laurey Arrow, MD   5 years ago Anxiety state   Primary Care at [place], MD       Future Appointments             In 3 days Just, Laurita Quint, FNP Primary Care at [place], [facility]

## 2020-05-10 ENCOUNTER — Encounter: Payer: Self-pay | Admitting: Family Medicine

## 2020-05-10 ENCOUNTER — Telehealth (INDEPENDENT_AMBULATORY_CARE_PROVIDER_SITE_OTHER): Payer: Medicare HMO | Admitting: Family Medicine

## 2020-05-10 ENCOUNTER — Other Ambulatory Visit: Payer: Self-pay

## 2020-05-10 DIAGNOSIS — F3341 Major depressive disorder, recurrent, in partial remission: Secondary | ICD-10-CM | POA: Diagnosis not present

## 2020-05-10 MED ORDER — CLOMIPRAMINE HCL 50 MG PO CAPS: 100.0000 mg | ORAL_CAPSULE | Freq: Every day | ORAL | 1 refills | Status: DC

## 2020-05-10 MED ORDER — CLOMIPRAMINE HCL 50 MG PO CAPS: 100.0000 mg | ORAL_CAPSULE | Freq: Every day | ORAL | 6 refills | Status: DC
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
## 2020-05-10 NOTE — Patient Instructions (Addendum)
[email or web address].[email or web address].Gov">  Generalized Anxiety Disorder, Adult Generalized anxiety disorder (GAD) is a mental health condition. Unlike normal worries, anxiety related to GAD is not triggered by a specific event. These worries do not fade or get better with time. GAD interferes with relationships, work, and school. GAD symptoms can vary from mild to severe. People with severe GAD can have intense waves of anxiety with physical symptoms that are similar to panic attacks. What are the causes? The exact cause of GAD is not known, but the following are believed to have an impact:  Differences in natural brain chemicals.  Genes passed down from parents to children.  Differences in the way threats are perceived.  Development during childhood.  Personality. What increases the risk? The following factors may make you more likely to develop this condition:  Being male.  Having a family history of anxiety disorders.  Being very shy.  Experiencing very stressful life events, such as the death of a loved one.  Having a very stressful family environment. What are the signs or symptoms? People with GAD often worry excessively about many things in their lives, such as their health and family. Symptoms may also include:  Mental and emotional symptoms: ? Worrying excessively about natural disasters. ? Fear of being late. ? Difficulty concentrating. ? Fears that others are judging your performance.  Physical symptoms: ? Fatigue. ? Headaches, muscle tension, muscle twitches, trembling, or feeling shaky. ? Feeling like your heart is pounding or beating very fast. ? Feeling out of breath or like you cannot take a deep breath. ? Having trouble falling asleep or staying asleep, or experiencing restlessness. ? Sweating. ? Nausea, diarrhea, or irritable bowel syndrome (IBS).  Behavioral symptoms: ? Experiencing erratic moods or irritability. ? Avoidance of new situations. ? Avoidance of  people. ? Extreme difficulty making decisions. How is this diagnosed? This condition is diagnosed based on your symptoms and medical history. You will also have a physical exam. Your health care provider may perform tests to rule out other possible causes of your symptoms. To be diagnosed with GAD, a person must have anxiety that:  Is out of his or her control.  Affects several different aspects of his or her life, such as work and relationships.  Causes distress that makes him or her unable to take part in normal activities.  Includes at least three symptoms of GAD, such as restlessness, fatigue, trouble concentrating, irritability, muscle tension, or sleep problems. Before your health care provider can confirm a diagnosis of GAD, these symptoms must be present more days than they are not, and they must last for 6 months or longer. How is this treated? This condition may be treated with:  Medicine. Antidepressant medicine is usually prescribed for long-term daily control. Anti-anxiety medicines may be added in severe cases, especially when panic attacks occur.  Talk therapy (psychotherapy). Certain types of talk therapy can be helpful in treating GAD by providing support, education, and guidance. Options include: ? Cognitive behavioral therapy (CBT). People learn coping skills and self-calming techniques to ease their physical symptoms. They learn to identify unrealistic thoughts and behaviors and to replace them with more appropriate thoughts and behaviors. ? Acceptance and commitment therapy (ACT). This treatment teaches people how to be mindful as a way to cope with unwanted thoughts and feelings. ? Biofeedback. This process trains you to manage your body's response (physiological response) through breathing techniques and relaxation methods. You will work with a therapist while machines are used to monitor  your physical symptoms.  Stress management techniques. These include yoga,  meditation, and exercise. A mental health specialist can help determine which treatment is best for you. Some people see improvement with one type of therapy. However, other people require a combination of therapies.   Follow these instructions at home: Lifestyle  Maintain a consistent routine and schedule.  Anticipate stressful situations. Create a plan, and allow extra time to work with your plan.  Practice stress management or self-calming techniques that you have learned from your therapist or your health care provider. General instructions  Take over-the-counter and prescription medicines only as told by your health care provider.  Understand that you are likely to have setbacks. Accept this and be kind to yourself as you persist to take better care of yourself.  Recognize and accept your accomplishments, even if you judge them as small.  Keep all follow-up visits as told by your health care provider. This is important. Contact a health care provider if:  Your symptoms do not get better.  Your symptoms get worse.  You have signs of depression, such as: ? A persistently sad or irritable mood. ? Loss of enjoyment in activities that used to bring you joy. ? Change in weight or eating. ? Changes in sleeping habits. ? Avoiding friends or family members. ? Loss of energy for normal tasks. ? Feelings of guilt or worthlessness. Get help right away if:  You have serious thoughts about hurting yourself or others. If you ever feel like you may hurt yourself or others, or have thoughts about taking your own life, get help right away. Go to your nearest emergency department or:  Call your local emergency services (911 in the [place]  Call a suicide crisis helpline, such as the [place] at 763 143 4062. This is open 24 hours a day in the [place]  Text the Crisis Text Line at 952 011 1008 (in the [place] Summary  Generalized anxiety disorder (GAD) is a mental  health condition that involves worry that is not triggered by a specific event.  People with GAD often worry excessively about many things in their lives, such as their health and family.  GAD may cause symptoms such as restlessness, trouble concentrating, sleep problems, frequent sweating, nausea, diarrhea, headaches, and trembling or muscle twitching.  A mental health specialist can help determine which treatment is best for you. Some people see improvement with one type of therapy. However, other people require a combination of therapies. This information is not intended to replace advice given to you by your health care provider. Make sure you discuss any questions you have with your health care provider. Document Revised: 01/29/2019 Document Reviewed: 01/29/2019 Elsevier Patient Education  2021 [employer].  If you have lab work done today you will be contacted with your lab results within the next 2 weeks.  If you have not heard from [place] then please contact us. The fastest way to get your results is to register for My Chart.   IF you received an x-ray today, you will receive an invoice from [facility]iology. Please contact [facility] at [phone] with questions or concerns regarding your invoice.   IF you received labwork today, you will receive an invoice from [facility]. Please contact [facility] at [phone] with questions or concerns regarding your invoice.   Our billing staff will not be able to assist you with questions regarding bills from these companies.  You will be contacted with the lab results as soon as they are  available. The fastest way to get your results is to activate your My Chart account. Instructions are located on the last page of this paperwork. If you have not heard from [place] regarding the results in 2 weeks, please contact this office.

## 2020-05-10 NOTE — Telephone Encounter (Signed)
Generic version sent to pharmacy.

## 2020-05-10 NOTE — Telephone Encounter (Signed)
Pt has an appt with Ms. Just today.

## 2020-05-19 NOTE — Telephone Encounter (Signed)
Pt called regarding the medication that was suppose to be prescribed on virtual visit on 1/17 for pt having vertigo  Rx was suppose to be for antivert for his vertigo   [id number] [place], [place] - [address]  Please advise.

## 2020-05-20 ENCOUNTER — Other Ambulatory Visit: Payer: Self-pay | Admitting: Family Medicine

## 2020-05-20 NOTE — Telephone Encounter (Signed)
Pt reports was dx vertigo "many years ago" at an [place] physician office and that it is worse when he feels unwell. Reports balance loss when initially standing, states the anivert really helps.  Please advise. OV / virtual for vertigo?

## 2020-05-20 NOTE — Telephone Encounter (Signed)
Pt need visit for vertigo in office if at all possible, this was not discussed last visit and kelsea must evaluate before prescribing this medication

## 2020-05-20 NOTE — Telephone Encounter (Signed)
Pt called stating did not receive med on 05/10/2020 please advise

## 2020-05-20 NOTE — Telephone Encounter (Signed)
I don't see he has ever been on that medication in the past nor do I see any mentions of dizziness or vertigo in my note.  I also don't see any history of vertigo for him. Please clarify.

## 2020-05-20 NOTE — Telephone Encounter (Signed)
I think that would be best given it was never mentioned at any point in a visit I can find. Office visit would be best if he can't be seen by anyone until Monday I can get him something to get thru the weekend.

## 2020-05-20 NOTE — Telephone Encounter (Signed)
Called pt and tried to sch appt and he stated "it doesn't matter" and hung up. Did not make appt.

## 2020-05-25 ENCOUNTER — Other Ambulatory Visit: Payer: Self-pay | Admitting: Family Medicine

## 2020-05-25 DIAGNOSIS — F3341 Major depressive disorder, recurrent, in partial remission: Secondary | ICD-10-CM

## 2020-05-25 MED ORDER — CLOMIPRAMINE HCL 50 MG PO CAPS: 100.0000 mg | ORAL_CAPSULE | Freq: Every day | ORAL | 2 refills | Status: DC

## 2020-05-25 NOTE — Telephone Encounter (Signed)
05/25/2020 - PATIENT WOULD LIKE KELSEA TO KNOW THAT HE NEEDS A REFILL ON HIS CLOMIPRAMINE (ANAFRANIL) 50 mg. HE ALSO SAID HE SPOKE WITH KELSEA REGARDING HER PRESCRIBING HIM ANTIVERT FOR HIS DIZZINESS. IT WAS GIVEN TO HIM BY [facility] YEARS AGO. BEST PHONE [phone] (CELL)  PHARMAC[facility]ICE IS: [facility]   [place]

## 2020-06-09 ENCOUNTER — Telehealth: Payer: Self-pay | Admitting: Family Medicine

## 2020-06-09 NOTE — Telephone Encounter (Signed)
Medication Refill - Medication: LORazepam (ATIVAN) tablet 1 mg    Has the patient contacted their pharmacy? No. (Agent: If no, request that the patient contact the pharmacy for the refill.) (Agent: If yes, when and what did the pharmacy advise?)  Preferred Pharmacy (with phone number or street name):  [id number] [address], [place] Phone:  [phone]  Fax:  [phone]       Agent: Please be advised that RX refills may take up to 3 business days. We ask that you follow-up with your pharmacy.

## 2020-06-09 NOTE — Telephone Encounter (Signed)
We have not filled this medication for a while. Pt needs to be seen to revisit getting this medication. Attempted to call pt to notify him. No answer.

## 2020-06-21 ENCOUNTER — Encounter: Payer: Self-pay | Admitting: Family Medicine

## 2020-06-21 ENCOUNTER — Telehealth (INDEPENDENT_AMBULATORY_CARE_PROVIDER_SITE_OTHER): Payer: Medicare HMO | Admitting: Family Medicine

## 2020-06-21 DIAGNOSIS — F3341 Major depressive disorder, recurrent, in partial remission: Secondary | ICD-10-CM | POA: Diagnosis not present

## 2020-06-21 MED ORDER — CLOMIPRAMINE HCL 50 MG PO CAPS: 100.0000 mg | ORAL_CAPSULE | Freq: Every day | ORAL | 2 refills | Status: DC
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
## 2020-06-21 NOTE — Patient Instructions (Addendum)
http://APA.org/depression-guideline"> https://clinicalkey.com"> http://point-of-care.elsevierperformancemanager.com/skills/"> http://point-of-care.elsevierperformancemanager.com">  Managing Depression, Adult Depression is a mental health condition that affects your thoughts, feelings, and actions. Being diagnosed with depression can bring you relief if you did not know why you have felt or behaved a certain way. It could also leave you feeling overwhelmed with uncertainty about your future. Preparing yourself to manage your symptoms can help you feel more positive about your future. How to manage lifestyle changes Managing stress Stress is your body's reaction to life changes and events, both good and bad. Stress can add to your feelings of depression. Learning to manage your stress can help lessen your feelings of depression. Try some of the following approaches to reducing your stress (stress reduction techniques):  Listen to music that you enjoy and that inspires you.  Try using a meditation app or take a meditation class.  Develop a practice that helps you connect with your spiritual self. Walk in nature, pray, or go to a place of worship.  Do some deep breathing. To do this, inhale slowly through your nose. Pause at the top of your inhale for a few seconds and then exhale slowly, letting your muscles relax.  Practice yoga to help relax and work your muscles. Choose a stress reduction technique that suits your lifestyle and personality. These techniques take time and practice to develop. Set aside 5-15 minutes a day to do them. Therapists can offer training in these techniques. Other things you can do to manage stress include:  Keeping a stress diary.  Knowing your limits and saying no when you think something is too much.  Paying attention to how you react to certain situations. You may not be able to control everything, but you can change your reaction.  Adding humor to your life by  watching funny films or TV shows.  Making time for activities that you enjoy and that relax you.   Medicines Medicines, such as antidepressants, are often a part of treatment for depression.  Talk with your pharmacist or health care provider about all the medicines, supplements, and herbal products that you take, their possible side effects, and what medicines and other products are safe to take together.  Make sure to report any side effects you may have to your health care provider. Relationships Your health care provider may suggest family therapy, couples therapy, or individual therapy as part of your treatment. How to recognize changes Everyone responds differently to treatment for depression. As you recover from depression, you may start to:  Have more interest in doing activities.  Feel less hopeless.  Have more energy.  Overeat less often, or have a better appetite.  Have better mental focus. It is important to recognize if your depression is not getting better or is getting worse. The symptoms you had in the beginning may return, such as:  Tiredness (fatigue) or low energy.  Eating too much or too little.  Sleeping too much or too little.  Feeling restless, agitated, or hopeless.  Trouble focusing or making decisions.  Unexplained physical complaints.  Feeling irritable, angry, or aggressive. If you or your family members notice these symptoms coming back, let your health care provider know right away. Follow these instructions at home: Activity  Try to get some form of exercise each day, such as walking, biking, swimming, or lifting weights.  Practice stress reduction techniques.  Engage your mind by taking a class or doing some volunteer work.   Lifestyle  Get the right amount and quality of sleep.  Cut down on using caffeine, tobacco, alcohol, and other potentially harmful substances.  Eat a healthy diet that includes plenty of vegetables, fruits,  whole grains, low-fat dairy products, and lean protein. Do not eat a lot of foods that are high in solid fats, added sugars, or salt (sodium). General instructions  Take over-the-counter and prescription medicines only as told by your health care provider.  Keep all follow-up visits as told by your health care provider. This is important. Where to find support Talking to others Friends and family members can be sources of support and guidance. Talk to trusted friends or family members about your condition. Explain your symptoms to them, and let them know that you are working with a health care provider to treat your depression. Tell friends and family members how they also can be helpful.   Finances  Find appropriate mental health providers that fit with your financial situation.  Talk with your health care provider about options to get reduced prices on your medicines. Where to find more information You can find support in your area from:  Anxiety and Depression Association of America (ADAA): www.adaa.org  Mental Health America: www.mentalhealthamerica.net  [employer] on Mental Illness: www.nami.org Contact a health care provider if:  You stop taking your antidepressant medicines, and you have any of these symptoms: ? Nausea. ? Headache. ? Light-headedness. ? Chills and body aches. ? Not being able to sleep (insomnia).  You or your friends and family think your depression is getting worse. Get help right away if:  You have thoughts of hurting yourself or others. If you ever feel like you may hurt yourself or others, or have thoughts about taking your own life, get help right away. Go to your nearest emergency department or:  Call your local emergency services (911 in the [place]  Call a suicide crisis helpline, such as the [place] at 778-057-2614. This is open 24 hours a day in the [place]  Text the Crisis Text Line at 850-467-7393 (in the  [place].). Summary  If you are diagnosed with depression, preparing yourself to manage your symptoms is a good way to feel positive about your future.  Work with your health care provider on a management plan that includes stress reduction techniques, medicines (if applicable), therapy, and healthy lifestyle habits.  Keep talking with your health care provider about how your treatment is working.  If you have thoughts about taking your own life, call a suicide crisis helpline or text a crisis text line. This information is not intended to replace advice given to you by your health care provider. Make sure you discuss any questions you have with your health care provider. Document Revised: 02/19/2019 Document Reviewed: 02/19/2019 Elsevier Patient Education  2021 [place], Adult After being diagnosed with an anxiety disorder, you may be relieved to know why you have felt or behaved a certain way. You may also feel overwhelmed about the treatment ahead and what it will mean for your life. With care and support, you can manage this condition and recover from it. How to manage lifestyle changes Managing stress and anxiety Stress is your body's reaction to life changes and events, both good and bad. Most stress will last just a few hours, but stress can be ongoing and can lead to more than just stress. Although stress can play a major role in anxiety, it is not the same as anxiety. Stress is usually caused by something external, such as a deadline,  test, or competition. Stress normally passes after the triggering event has ended.  Anxiety is caused by something internal, such as imagining a terrible outcome or worrying that something will go wrong that will devastate you. Anxiety often does not go away even after the triggering event is over, and it can become long-term (chronic) worry. It is important to understand the differences between stress and anxiety and to manage your stress  effectively so that it does not lead to an anxious response. Talk with your health care provider or a counselor to learn more about reducing anxiety and stress. He or she may suggest tension reduction techniques, such as:  Music therapy. This can include creating or listening to music that you enjoy and that inspires you.  Mindfulness-based meditation. This involves being aware of your normal breaths while not trying to control your breathing. It can be done while sitting or walking.  Centering prayer. This involves focusing on a word, phrase, or sacred image that means something to you and brings you peace.  Deep breathing. To do this, expand your stomach and inhale slowly through your nose. Hold your breath for 3-5 seconds. Then exhale slowly, letting your stomach muscles relax.  Self-talk. This involves identifying thought patterns that lead to anxiety reactions and changing those patterns.  Muscle relaxation. This involves tensing muscles and then relaxing them. Choose a tension reduction technique that suits your lifestyle and personality. These techniques take time and practice. Set aside 5-15 minutes a day to do them. Therapists can offer counseling and training in these techniques. The training to help with anxiety may be covered by some insurance plans. Other things you can do to manage stress and anxiety include:  Keeping a stress/anxiety diary. This can help you learn what triggers your reaction and then learn ways to manage your response.  Thinking about how you react to certain situations. You may not be able to control everything, but you can control your response.  Making time for activities that help you relax and not feeling guilty about spending your time in this way.  Visual imagery and yoga can help you stay calm and relax.   Medicines Medicines can help ease symptoms. Medicines for anxiety include:  Anti-anxiety drugs.  Antidepressants. Medicines are often used as a  primary treatment for anxiety disorder. Medicines will be prescribed by a health care provider. When used together, medicines, psychotherapy, and tension reduction techniques may be the most effective treatment. Relationships Relationships can play a big part in helping you recover. Try to spend more time connecting with trusted friends and family members. Consider going to couples counseling, taking family education classes, or going to family therapy. Therapy can help you and others better understand your condition. How to recognize changes in your anxiety Everyone responds differently to treatment for anxiety. Recovery from anxiety happens when symptoms decrease and stop interfering with your daily activities at home or work. This may mean that you will start to:  Have better concentration and focus. Worry will interfere less in your daily thinking.  Sleep better.  Be less irritable.  Have more energy.  Have improved memory. It is important to recognize when your condition is getting worse. Contact your health care provider if your symptoms interfere with home or work and you feel like your condition is not improving. Follow these instructions at home: Activity  Exercise. Most adults should do the following: ? Exercise for at least 150 minutes each week. The exercise should increase your heart  rate and make you sweat (moderate-intensity exercise). ? Strengthening exercises at least twice a week.  Get the right amount and quality of sleep. Most adults need 7-9 hours of sleep each night. Lifestyle  Eat a healthy diet that includes plenty of vegetables, fruits, whole grains, low-fat dairy products, and lean protein. Do not eat a lot of foods that are high in solid fats, added sugars, or salt.  Make choices that simplify your life.  Do not use any products that contain nicotine or tobacco, such as cigarettes, e-cigarettes, and chewing tobacco. If you need help quitting, ask your health  care provider.  Avoid caffeine, alcohol, and certain over-the-counter cold medicines. These may make you feel worse. Ask your pharmacist which medicines to avoid.   General instructions  Take over-the-counter and prescription medicines only as told by your health care provider.  Keep all follow-up visits as told by your health care provider. This is important. Where to find support You can get help and support from these sources:  Self-help groups.  Online and [employer].  A trusted spiritual leader.  Couples counseling.  Family education classes.  Family therapy. Where to find more information You may find that joining a support group helps you deal with your anxiety. The following sources can help you locate counselors or support groups near you:  [place]: www.mentalhealthamerica.net  Anxiety and Depression Association of [place] (ADAA): https://www.clark.net/  National Alliance on Mental Illness (NAMI): www.nami.org Contact a health care provider if you:  Have a hard time staying focused or finishing daily tasks.  Spend many hours a day feeling worried about everyday life.  Become exhausted by worry.  Start to have headaches, feel tense, or have nausea.  Urinate more than normal.  Have diarrhea. Get help right away if you have:  A racing heart and shortness of breath.  Thoughts of hurting yourself or others. If you ever feel like you may hurt yourself or others, or have thoughts about taking your own life, get help right away. You can go to your nearest emergency department or call:  Your local emergency services (911 in the [place]  A suicide crisis helpline, such as the [place] at 903-117-1734. This is open 24 hours a day. Summary  Taking steps to learn and use tension reduction techniques can help calm you and help prevent triggering an anxiety reaction.  When used together, medicines, psychotherapy, and  tension reduction techniques may be the most effective treatment.  Family, friends, and partners can play a big part in helping you recover from an anxiety disorder. This information is not intended to replace advice given to you by your health care provider. Make sure you discuss any questions you have with your health care provider. Document Revised: 09/10/2018 Document Reviewed: 09/10/2018 Elsevier Patient Education  [place].    If you have lab work done today you will be contacted with your lab results within the next 2 weeks.  If you have not heard from [place] then please contact us. The fastest way to get your results is to register for My Chart.   IF you received an x-ray today, you will receive an invoice from [facility]. Please contact [facility] at [phone] with questions or concerns regarding your invoice.   IF you received labwork today, you will receive an invoice from [facility]. Please contact [facility] at [phone] with questions or concerns regarding your invoice.   Our billing staff will not be able to  assist you with questions regarding bills from these companies.  You will be contacted with the lab results as soon as they are available. The fastest way to get your results is to activate your My Chart account. Instructions are located on the last page of this paperwork. If you have not heard from [place] regarding the results in 2 weeks, please contact this office.

## 2020-07-07 ENCOUNTER — Telehealth: Payer: Self-pay

## 2020-07-07 NOTE — Telephone Encounter (Signed)
Called Christopher Dawson for more details. A referral was place back on 06/21/2020 for the Christopher Dawson to see Eino Farber PA that the Christopher Dawson has seen before. As off today the referral is still active and the Christopher Dawson should be receiving a call from the office soon.

## 2020-07-07 NOTE — Telephone Encounter (Signed)
Pt. Called asserting Ms. Just had called him with respect to a referral but could not recall as to what referral. No documentation in the chart with respect to any referral

## 2020-07-14 NOTE — Telephone Encounter (Signed)
07/14/2021 - PATIENT HAS CALLED BACK TO SAY HE STILL HAS NOT HEARD FROM THIS OFFICE. HE SAID [facility] WAS TO SEND HIM TO A PSYCHIATRIST IF HE COULD NOT SEE THIS JO HUGHES PA. HE SAID HE HAS NOT RECEIVED A CALL FROM ANYONE. HE WOULD LIKE THIS DONE AS SOON AS POSSIBLE PLEASE. BEST PHONE [phone] (CELL) [place]

## 2020-07-14 NOTE — Telephone Encounter (Signed)
I called Eino Farber since the referral was faxed to that location back in Feb. They stated patient has an outstanding balance therefore they will not schedule till patient clear up balance. I then called [facility]. They stated patient would need to call their office to register then they would handle from there. Patient was informed and stated he did not have a pen and could I put information in our notes and he would call back to get this information.   Mental health service in [place], [place] Address: [address], [place], [place] [address] Closes soon ? 5PM ? [address] Phone: [phone]  Patient need to call between the hours of 8:00-3:00  Mon-Fri  only

## 2020-10-19 ENCOUNTER — Ambulatory Visit: Payer: Medicare HMO | Admitting: Family Medicine

## 2020-11-15 ENCOUNTER — Ambulatory Visit: Payer: Medicare Other | Admitting: Family Medicine

## 2020-11-25 ENCOUNTER — Ambulatory Visit (INDEPENDENT_AMBULATORY_CARE_PROVIDER_SITE_OTHER): Payer: Medicare Other | Admitting: Family Medicine

## 2020-11-25 ENCOUNTER — Encounter: Payer: Self-pay | Admitting: Family Medicine

## 2020-11-25 ENCOUNTER — Other Ambulatory Visit: Payer: Self-pay

## 2020-11-25 VITALS — BP 146/88 | HR 107 | Temp 97.2°F | Ht 75.0 in | Wt 203.4 lb

## 2020-11-25 DIAGNOSIS — F17200 Nicotine dependence, unspecified, uncomplicated: Secondary | ICD-10-CM

## 2020-11-25 DIAGNOSIS — F1011 Alcohol abuse, in remission: Secondary | ICD-10-CM | POA: Diagnosis not present

## 2020-11-25 DIAGNOSIS — R030 Elevated blood-pressure reading, without diagnosis of hypertension: Secondary | ICD-10-CM | POA: Diagnosis not present

## 2020-11-25 DIAGNOSIS — Z659 Problem related to unspecified psychosocial circumstances: Secondary | ICD-10-CM

## 2020-11-25 DIAGNOSIS — F332 Major depressive disorder, recurrent severe without psychotic features: Secondary | ICD-10-CM | POA: Diagnosis not present

## 2020-11-25 MED ORDER — ESCITALOPRAM OXALATE 10 MG PO TABS: ORAL_TABLET | ORAL | 1 refills | Status: DC
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
## 2020-11-26 ENCOUNTER — Telehealth: Payer: Self-pay | Admitting: Family Medicine

## 2020-11-26 ENCOUNTER — Other Ambulatory Visit: Payer: Self-pay | Admitting: Family Medicine

## 2020-11-26 ENCOUNTER — Other Ambulatory Visit: Payer: Self-pay

## 2020-11-26 DIAGNOSIS — F332 Major depressive disorder, recurrent severe without psychotic features: Secondary | ICD-10-CM

## 2020-11-26 MED ORDER — CLOMIPRAMINE HCL 25 MG PO CAPS: ORAL_CAPSULE | ORAL | 0 refills | Status: DC

## 2020-11-26 NOTE — Telephone Encounter (Signed)
Patient would pending Rx sent to different pharmacy, called and canceled Rx that was sent to [facility].

## 2020-11-26 NOTE — Telephone Encounter (Signed)
Pt was here yesterday and is needing his clomiPRAMINE refilled.   [facility], [address]

## 2020-11-26 NOTE — Addendum Note (Signed)
Addended by: Lynda Rainwater on: 11/26/2020 03:12 PM   Modules accepted: Orders

## 2020-11-26 NOTE — Telephone Encounter (Signed)
Talked with pt again, Clomipramine was sent to the wrong pharmacy.   Please send to  [facility] [place], [place] believe he wants this updated in his records too   Thank you.

## 2020-11-26 NOTE — Telephone Encounter (Signed)
Patient calling states that he did not receive a refill on Clomipramine informed patient that his records show that this medication was discontinue. Patient states that he need to be on both Lexapro and Clomipramine. Please advise.

## 2020-12-01 ENCOUNTER — Telehealth: Payer: Self-pay | Admitting: Family Medicine

## 2020-12-01 DIAGNOSIS — F332 Major depressive disorder, recurrent severe without psychotic features: Secondary | ICD-10-CM

## 2020-12-01 NOTE — Telephone Encounter (Signed)
Pt is wanting his clomiPRAMINE (ANAFRANIL) 25 MG capsule [[id number]] changed to a 50mg  pil. He would take 2 pills before bedtime to equal 100mg  total a night. He says this is his usual.   [place], [address]  [place], [place] [address]  Phone:  [phone]  Fax:  [phone]  DEA #:  [id number]

## 2020-12-01 NOTE — Telephone Encounter (Signed)
Please advise message below patient would like Clomipramine increased to 50mg  to take 2 pills at bedtime.

## 2020-12-07 MED ORDER — CLOMIPRAMINE HCL 25 MG PO CAPS: ORAL_CAPSULE | ORAL | 1 refills | Status: DC

## 2020-12-07 NOTE — Telephone Encounter (Signed)
Pt called stating he only has one pill left for tonight... needs refill since he doubled the dosage at night (per last msgs).

## 2020-12-07 NOTE — Telephone Encounter (Signed)
Please advise message below  °

## 2020-12-08 MED ORDER — CLOMIPRAMINE HCL 50 MG PO CAPS: 50.0000 mg | ORAL_CAPSULE | Freq: Every day | ORAL | 1 refills | Status: DC

## 2020-12-08 NOTE — Telephone Encounter (Signed)
Please advise message below. Patient would like Rx for Clomipramine sent in for 50mg .

## 2020-12-08 NOTE — Telephone Encounter (Signed)
Pt called back and said it was suppose to be for 100mg  a day and that it needed to be for 50mg  capsule. Please advise

## 2020-12-08 NOTE — Addendum Note (Signed)
Addended by: Abelino Derrick A on: 12/08/2020 11:18 AM   Modules accepted: Orders

## 2020-12-08 NOTE — Telephone Encounter (Signed)
Pt is needing his Clomipramine resent to [facility] on [address]. It needs to be 50mg  not 25mg s. His insurance will not refill his script any sooner and he needs 100mg  per night. Please advise pt

## 2020-12-08 NOTE — Telephone Encounter (Signed)
Spoke with patient informed patient that the requested Rx was sent in but should only be taking 50mg  only per day. Patient states that he will take two of the 50 mg and deal with refills later when he runs out knowing that he will run out sooner than what the prescription is written.

## 2020-12-24 ENCOUNTER — Telehealth: Payer: Self-pay | Admitting: Family Medicine

## 2020-12-24 NOTE — Telephone Encounter (Signed)
Pt is requesting  a script for Ativan 1mg  before he starts taking his Lexapro. Pharmacy  [facility] [place], [address] AT [address]  [place], [place] [address]  Phone:  [phone]  Fax:  [phone]  DEA #:  [id number]   Please advise pt

## 2020-12-24 NOTE — Telephone Encounter (Signed)
Called patient to inform that Dr. Ethelene Hal would like for patient to only take Lexapro no Ativan. NO answer unable to leave a message will call back.

## 2020-12-24 NOTE — Telephone Encounter (Signed)
Please advise message below, patient requesting Ativan 1mg  before he starts his Lexapro.

## 2020-12-28 ENCOUNTER — Ambulatory Visit: Payer: Medicare Other | Admitting: Family Medicine

## 2020-12-28 NOTE — Telephone Encounter (Signed)
Patient aware to only take Lexapro

## 2020-12-31 NOTE — Telephone Encounter (Signed)
Pt would like Dr. Ethelene Hal to reconsider this request. He is saying that he is not falling because of the Ativa, but because of his back. Please advise pt at [phone].

## 2020-12-31 NOTE — Telephone Encounter (Signed)
Please advise message below  °

## 2021-01-03 NOTE — Telephone Encounter (Signed)
Please advise message below  °

## 2021-01-03 NOTE — Telephone Encounter (Signed)
Pt states he's had anxiety since 65 years old and he has severe anxiety. He said that he is only asking for 1mg  Ativan. He said right now it is really bad.  Pt requesting phone cal with more detailed information and advise.   Phone # [phone]

## 2021-01-05 NOTE — Telephone Encounter (Signed)
Spoke with patient who verbally understood message/recommendations. Per patient he does not feel like he need to be evaluated he already know what the issue is. Patient states that there are no other concerns at this time.

## 2021-01-18 ENCOUNTER — Ambulatory Visit: Payer: Medicare Other | Admitting: Family Medicine

## 2021-02-22 ENCOUNTER — Telehealth: Payer: Self-pay | Admitting: Family Medicine

## 2021-02-22 NOTE — Telephone Encounter (Signed)
Unable to leave message for patient to call back and schedule Medicare Annual Wellness Visit (AWV) in office. No voice mail.  If not able to come in office, please offer to do virtually or by telephone.  Left office number and my jabber [phone].  Due for AWVI  Please schedule at anytime with Nurse Health Advisor.

## 2021-03-21 ENCOUNTER — Telehealth: Payer: Self-pay | Admitting: Family Medicine

## 2021-03-21 NOTE — Telephone Encounter (Signed)
Pt needs refill on clomipramine... requesting 100 mg dosage.

## 2021-03-22 ENCOUNTER — Other Ambulatory Visit: Payer: Self-pay | Admitting: Family Medicine

## 2021-03-22 DIAGNOSIS — F332 Major depressive disorder, recurrent severe without psychotic features: Secondary | ICD-10-CM

## 2021-03-22 NOTE — Telephone Encounter (Signed)
Refill sent in

## 2021-03-30 ENCOUNTER — Telehealth: Payer: Self-pay | Admitting: Family Medicine

## 2021-03-30 NOTE — Telephone Encounter (Signed)
Left message for patient to call back to schedule Medicare Annual Wellness Visit   No hx of AWV eligible as of 06/22/13  Please schedule at anytime with [facility]-Nurse Health Advisor if patient calls the office back.    30 Minutes appointment   Any questions, please call me at [phone]

## 2021-03-31 ENCOUNTER — Ambulatory Visit: Payer: Medicare Other | Admitting: Physician Assistant

## 2021-04-07 ENCOUNTER — Ambulatory Visit: Payer: Medicare Other | Admitting: Orthopaedic Surgery

## 2021-04-12 NOTE — Telephone Encounter (Signed)
Pt called wondering why nobody has adjusted his meds to the 100mg ....   After seeing the last msg, it's a little more clear.  I guess a nurse will need to call and explain again.

## 2021-04-13 ENCOUNTER — Ambulatory Visit: Payer: Medicare Other | Admitting: Orthopaedic Surgery

## 2021-04-16 ENCOUNTER — Other Ambulatory Visit: Payer: Self-pay | Admitting: Family Medicine

## 2021-04-16 DIAGNOSIS — F332 Major depressive disorder, recurrent severe without psychotic features: Secondary | ICD-10-CM

## 2021-04-23 ENCOUNTER — Emergency Department (HOSPITAL_COMMUNITY): Payer: Medicare Other

## 2021-04-23 ENCOUNTER — Other Ambulatory Visit: Payer: Self-pay

## 2021-04-23 ENCOUNTER — Inpatient Hospital Stay (HOSPITAL_COMMUNITY)
Admission: EM | Admit: 2021-04-23 | Discharge: 2021-05-13 | DRG: 478 | Disposition: A | Discharge status: Skilled Nursing Facility | Payer: Medicare Other | Attending: Internal Medicine | Admitting: Internal Medicine

## 2021-04-23 ENCOUNTER — Encounter (HOSPITAL_COMMUNITY): Payer: Self-pay

## 2021-04-23 DIAGNOSIS — C3490 Malignant neoplasm of unspecified part of unspecified bronchus or lung: Secondary | ICD-10-CM | POA: Diagnosis not present

## 2021-04-23 DIAGNOSIS — C7971 Secondary malignant neoplasm of right adrenal gland: Secondary | ICD-10-CM | POA: Diagnosis present

## 2021-04-23 DIAGNOSIS — G8929 Other chronic pain: Secondary | ICD-10-CM

## 2021-04-23 DIAGNOSIS — F419 Anxiety disorder, unspecified: Secondary | ICD-10-CM | POA: Diagnosis present

## 2021-04-23 DIAGNOSIS — R918 Other nonspecific abnormal finding of lung field: Secondary | ICD-10-CM | POA: Diagnosis not present

## 2021-04-23 DIAGNOSIS — Z681 Body mass index (BMI) 19 or less, adult: Secondary | ICD-10-CM

## 2021-04-23 DIAGNOSIS — E440 Moderate protein-calorie malnutrition: Secondary | ICD-10-CM | POA: Diagnosis present

## 2021-04-23 DIAGNOSIS — M4804 Spinal stenosis, thoracic region: Secondary | ICD-10-CM | POA: Diagnosis present

## 2021-04-23 DIAGNOSIS — D499 Neoplasm of unspecified behavior of unspecified site: Secondary | ICD-10-CM | POA: Diagnosis not present

## 2021-04-23 DIAGNOSIS — R251 Tremor, unspecified: Secondary | ICD-10-CM | POA: Diagnosis present

## 2021-04-23 DIAGNOSIS — E7800 Pure hypercholesterolemia, unspecified: Secondary | ICD-10-CM | POA: Diagnosis not present

## 2021-04-23 DIAGNOSIS — F32A Depression, unspecified: Secondary | ICD-10-CM | POA: Diagnosis present

## 2021-04-23 DIAGNOSIS — E278 Other specified disorders of adrenal gland: Secondary | ICD-10-CM | POA: Diagnosis not present

## 2021-04-23 DIAGNOSIS — Q8740 Marfan's syndrome, unspecified: Secondary | ICD-10-CM | POA: Diagnosis not present

## 2021-04-23 DIAGNOSIS — M899 Disorder of bone, unspecified: Secondary | ICD-10-CM

## 2021-04-23 DIAGNOSIS — Z800 Family history of malignant neoplasm of digestive organs: Secondary | ICD-10-CM

## 2021-04-23 DIAGNOSIS — J431 Panlobular emphysema: Secondary | ICD-10-CM | POA: Diagnosis not present

## 2021-04-23 DIAGNOSIS — M5416 Radiculopathy, lumbar region: Secondary | ICD-10-CM | POA: Diagnosis present

## 2021-04-23 DIAGNOSIS — R531 Weakness: Secondary | ICD-10-CM

## 2021-04-23 DIAGNOSIS — E871 Hypo-osmolality and hyponatremia: Secondary | ICD-10-CM | POA: Diagnosis present

## 2021-04-23 DIAGNOSIS — F1090 Alcohol use, unspecified, uncomplicated: Secondary | ICD-10-CM | POA: Diagnosis present

## 2021-04-23 DIAGNOSIS — G893 Neoplasm related pain (acute) (chronic): Secondary | ICD-10-CM | POA: Diagnosis present

## 2021-04-23 DIAGNOSIS — Z539 Procedure and treatment not carried out, unspecified reason: Secondary | ICD-10-CM | POA: Diagnosis not present

## 2021-04-23 DIAGNOSIS — R296 Repeated falls: Secondary | ICD-10-CM | POA: Diagnosis present

## 2021-04-23 DIAGNOSIS — M8448XA Pathological fracture, other site, initial encounter for fracture: Secondary | ICD-10-CM | POA: Diagnosis present

## 2021-04-23 DIAGNOSIS — M549 Dorsalgia, unspecified: Secondary | ICD-10-CM | POA: Diagnosis present

## 2021-04-23 DIAGNOSIS — Z801 Family history of malignant neoplasm of trachea, bronchus and lung: Secondary | ICD-10-CM

## 2021-04-23 DIAGNOSIS — T380X5A Adverse effect of glucocorticoids and synthetic analogues, initial encounter: Secondary | ICD-10-CM | POA: Diagnosis not present

## 2021-04-23 DIAGNOSIS — G809 Cerebral palsy, unspecified: Secondary | ICD-10-CM | POA: Diagnosis present

## 2021-04-23 DIAGNOSIS — Z789 Other specified health status: Secondary | ICD-10-CM | POA: Diagnosis not present

## 2021-04-23 DIAGNOSIS — C3412 Malignant neoplasm of upper lobe, left bronchus or lung: Secondary | ICD-10-CM | POA: Diagnosis present

## 2021-04-23 DIAGNOSIS — C7951 Secondary malignant neoplasm of bone: Secondary | ICD-10-CM | POA: Diagnosis present

## 2021-04-23 DIAGNOSIS — J449 Chronic obstructive pulmonary disease, unspecified: Secondary | ICD-10-CM | POA: Diagnosis present

## 2021-04-23 DIAGNOSIS — Z7189 Other specified counseling: Secondary | ICD-10-CM | POA: Diagnosis not present

## 2021-04-23 DIAGNOSIS — Z20822 Contact with and (suspected) exposure to covid-19: Secondary | ICD-10-CM | POA: Diagnosis present

## 2021-04-23 DIAGNOSIS — Z808 Family history of malignant neoplasm of other organs or systems: Secondary | ICD-10-CM

## 2021-04-23 DIAGNOSIS — F1010 Alcohol abuse, uncomplicated: Secondary | ICD-10-CM | POA: Diagnosis present

## 2021-04-23 DIAGNOSIS — E785 Hyperlipidemia, unspecified: Secondary | ICD-10-CM | POA: Diagnosis present

## 2021-04-23 DIAGNOSIS — K5900 Constipation, unspecified: Secondary | ICD-10-CM | POA: Diagnosis present

## 2021-04-23 DIAGNOSIS — Z885 Allergy status to narcotic agent status: Secondary | ICD-10-CM

## 2021-04-23 DIAGNOSIS — R222 Localized swelling, mass and lump, trunk: Secondary | ICD-10-CM

## 2021-04-23 DIAGNOSIS — Z8051 Family history of malignant neoplasm of kidney: Secondary | ICD-10-CM

## 2021-04-23 DIAGNOSIS — F17210 Nicotine dependence, cigarettes, uncomplicated: Secondary | ICD-10-CM | POA: Diagnosis present

## 2021-04-23 DIAGNOSIS — N400 Enlarged prostate without lower urinary tract symptoms: Secondary | ICD-10-CM | POA: Diagnosis present

## 2021-04-23 DIAGNOSIS — Z515 Encounter for palliative care: Secondary | ICD-10-CM | POA: Diagnosis not present

## 2021-04-23 DIAGNOSIS — Z803 Family history of malignant neoplasm of breast: Secondary | ICD-10-CM

## 2021-04-23 DIAGNOSIS — E279 Disorder of adrenal gland, unspecified: Secondary | ICD-10-CM | POA: Diagnosis not present

## 2021-04-23 DIAGNOSIS — D72828 Other elevated white blood cell count: Secondary | ICD-10-CM | POA: Diagnosis not present

## 2021-04-23 DIAGNOSIS — M546 Pain in thoracic spine: Secondary | ICD-10-CM | POA: Diagnosis not present

## 2021-04-23 DIAGNOSIS — Z8249 Family history of ischemic heart disease and other diseases of the circulatory system: Secondary | ICD-10-CM | POA: Diagnosis not present

## 2021-04-23 DIAGNOSIS — Z79899 Other long term (current) drug therapy: Secondary | ICD-10-CM

## 2021-04-23 DIAGNOSIS — F320 Major depressive disorder, single episode, mild: Secondary | ICD-10-CM | POA: Diagnosis not present

## 2021-04-23 DIAGNOSIS — R911 Solitary pulmonary nodule: Secondary | ICD-10-CM | POA: Diagnosis not present

## 2021-04-23 LAB — CBC WITH DIFFERENTIAL/PLATELET
Abs Immature Granulocytes: 0.03 10*3/uL (ref 0.00–0.07)
Basophils Absolute: 0 10*3/uL (ref 0.0–0.1)
Basophils Relative: 0 %
Eosinophils Absolute: 0.1 10*3/uL (ref 0.0–0.5)
Eosinophils Relative: 1 %
HCT: 47.3 % (ref 39.0–52.0)
Hemoglobin: 15.8 g/dL (ref 13.0–17.0)
Immature Granulocytes: 0 %
Lymphocytes Relative: 16 %
Lymphs Abs: 1.6 10*3/uL (ref 0.7–4.0)
MCH: 29.9 pg (ref 26.0–34.0)
MCHC: 33.4 g/dL (ref 30.0–36.0)
MCV: 89.6 fL (ref 80.0–100.0)
Monocytes Absolute: 0.9 10*3/uL (ref 0.1–1.0)
Monocytes Relative: 9 %
Neutro Abs: 7.8 10*3/uL — ABNORMAL HIGH (ref 1.7–7.7)
Neutrophils Relative %: 74 %
Platelets: 376 10*3/uL (ref 150–400)
RBC: 5.28 MIL/uL (ref 4.22–5.81)
RDW: 12.1 % (ref 11.5–15.5)
WBC: 10.6 10*3/uL — ABNORMAL HIGH (ref 4.0–10.5)
nRBC: 0 % (ref 0.0–0.2)

## 2021-04-23 LAB — CBC
HCT: 42.3 % (ref 39.0–52.0)
Hemoglobin: 13.9 g/dL (ref 13.0–17.0)
MCH: 29.5 pg (ref 26.0–34.0)
MCHC: 32.9 g/dL (ref 30.0–36.0)
MCV: 89.8 fL (ref 80.0–100.0)
Platelets: 338 10*3/uL (ref 150–400)
RBC: 4.71 MIL/uL (ref 4.22–5.81)
RDW: 12.1 % (ref 11.5–15.5)
WBC: 8.6 10*3/uL (ref 4.0–10.5)
nRBC: 0 % (ref 0.0–0.2)

## 2021-04-23 LAB — TSH: TSH: 1.861 u[IU]/mL (ref 0.350–4.500)

## 2021-04-23 LAB — URINALYSIS, ROUTINE W REFLEX MICROSCOPIC
Bilirubin Urine: NEGATIVE
Glucose, UA: NEGATIVE mg/dL
Hgb urine dipstick: NEGATIVE
Ketones, ur: 80 mg/dL — AB
Leukocytes,Ua: NEGATIVE
Nitrite: NEGATIVE
Protein, ur: NEGATIVE mg/dL
Specific Gravity, Urine: >1.046 — ABNORMAL HIGH (ref 1.005–1.030)
pH: 5 (ref 5.0–8.0)

## 2021-04-23 LAB — BASIC METABOLIC PANEL
Anion gap: 9 (ref 5–15)
BUN: 19 mg/dL (ref 8–23)
CO2: 25 mmol/L (ref 22–32)
Calcium: 9.1 mg/dL (ref 8.9–10.3)
Chloride: 102 mmol/L (ref 98–111)
Creatinine, Ser: 0.57 mg/dL — ABNORMAL LOW (ref 0.61–1.24)
GFR, Estimated: >60 mL/min (ref >60)
Glucose, Bld: 90 mg/dL (ref 70–99)
Potassium: 3.7 mmol/L (ref 3.5–5.1)
Sodium: 136 mmol/L (ref 135–145)

## 2021-04-23 LAB — MAGNESIUM: Magnesium: 2.1 mg/dL (ref 1.7–2.4)

## 2021-04-23 LAB — RESP PANEL BY RT-PCR (FLU A&B, COVID) ARPGX2
Influenza A by PCR: NEGATIVE
Influenza B by PCR: NEGATIVE
SARS Coronavirus 2 by RT PCR: NEGATIVE

## 2021-04-23 LAB — PHOSPHORUS: Phosphorus: 3.1 mg/dL (ref 2.5–4.6)

## 2021-04-23 LAB — CREATININE, SERUM
Creatinine, Ser: 0.39 mg/dL — ABNORMAL LOW (ref 0.61–1.24)
GFR, Estimated: >60 mL/min (ref >60)

## 2021-04-23 IMAGING — CT CT L SPINE W/O CM
3 series · 10 of 34 positions shown, 12 images · non-contrast
Comparison: Lumbar spine radiographs [DATE]

CLINICAL DATA: Worsening chronic back pain.

EXAM:
CT THORACIC AND LUMBAR SPINE WITHOUT CONTRAST
TECHNIQUE: Multidetector CT imaging of the thoracic and lumbar spine was
performed without contrast. Multiplanar CT image reconstructions
were also generated.

[Series 4: l spine st · axial · 0.33mm/px · z∈[+1324,+1448]mm · 2 of 134 slices shown, 3 images]
[im 41/134  soft-tissue]
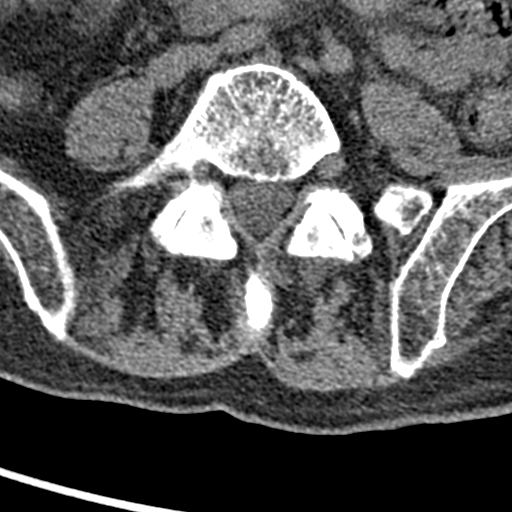
[im 41/134  bone]
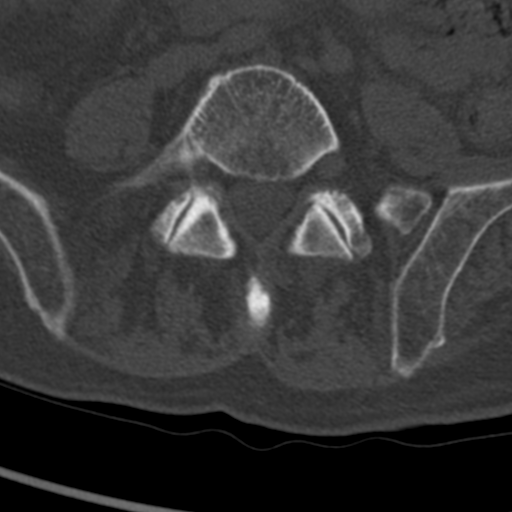
[im 103/134  bone]
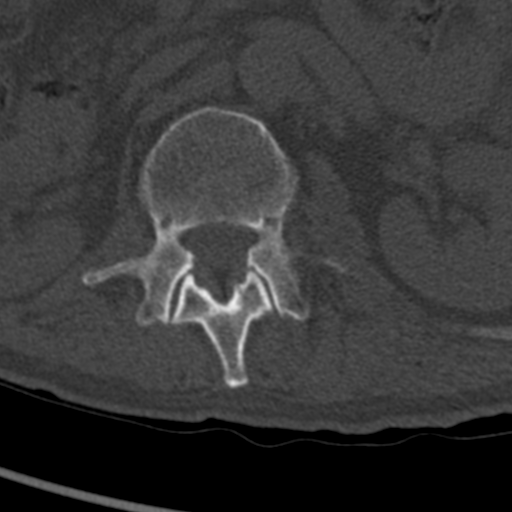

[Series 8: coronal bone · coronal · 0.28mm/px · 3 of 78 slices shown]
[im 16/78  bone]
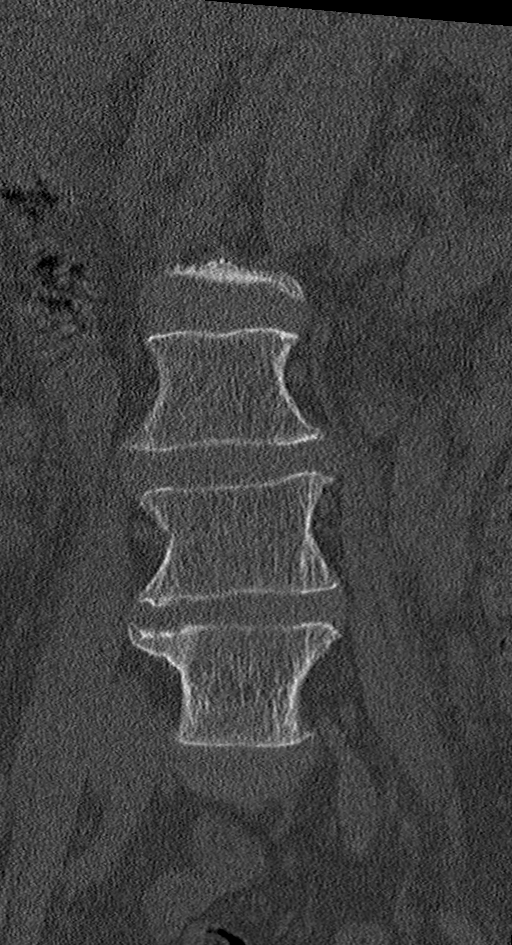
[im 31/78  bone]
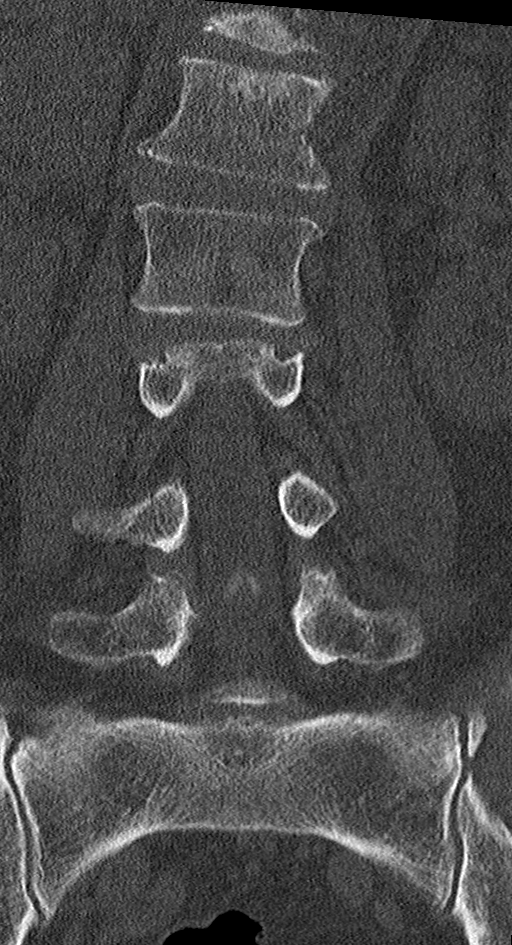
[im 47/78  bone]
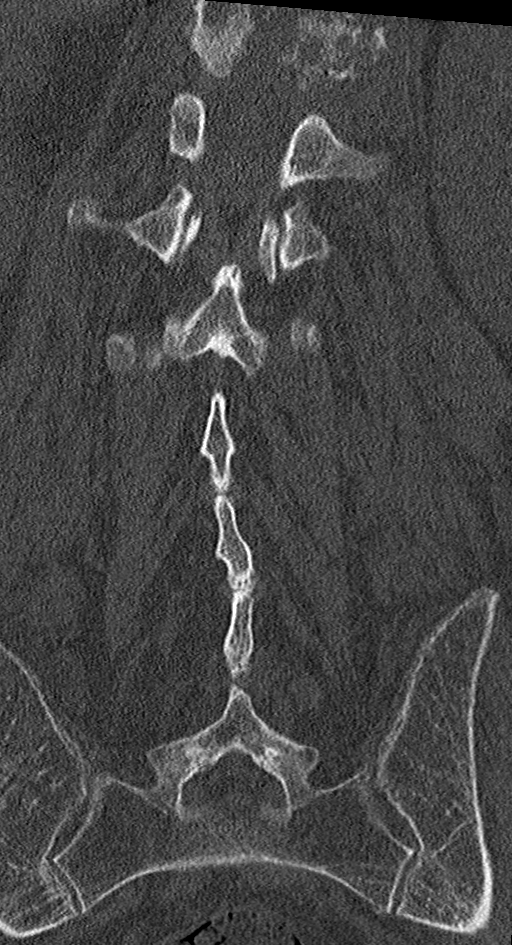

[Series 10: sagittal st · sagittal · 0.30mm/px · 5 of 72 slices shown, 6 images]
[im 24/72  bone]
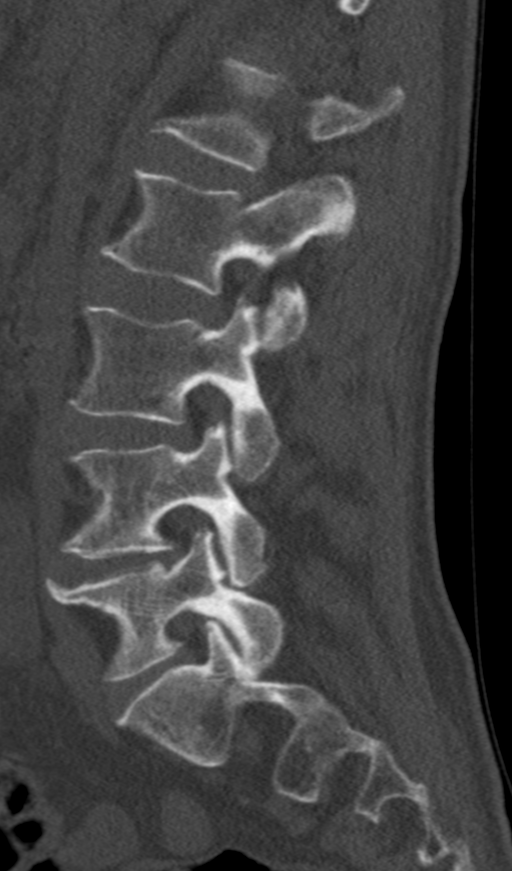
[im 30/72  bone]
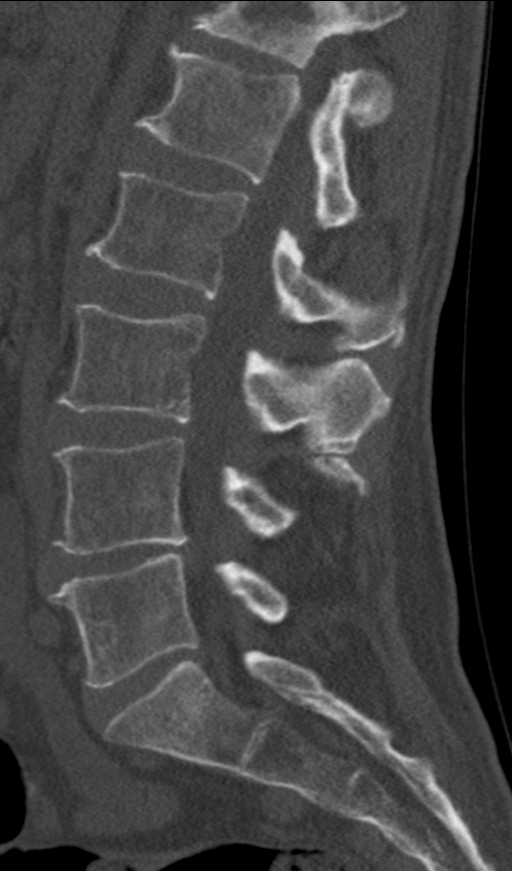
[im 36/72  soft-tissue]
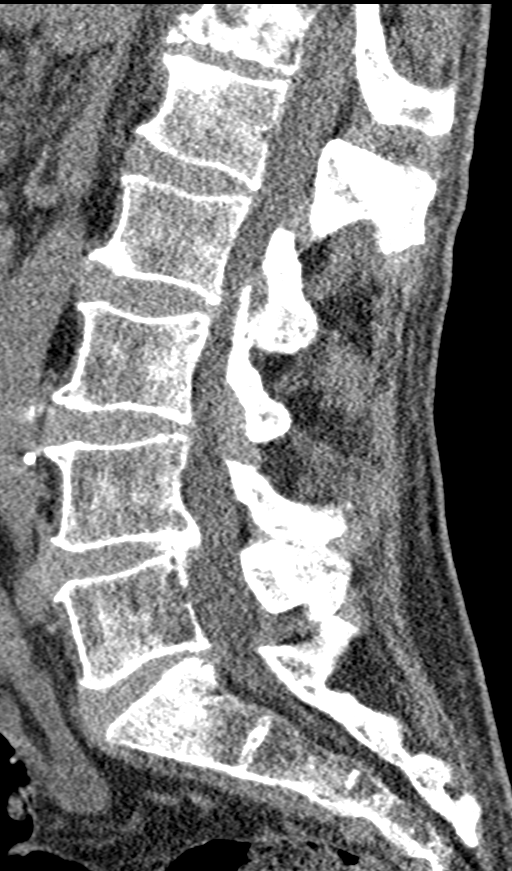
[im 36/72  bone]
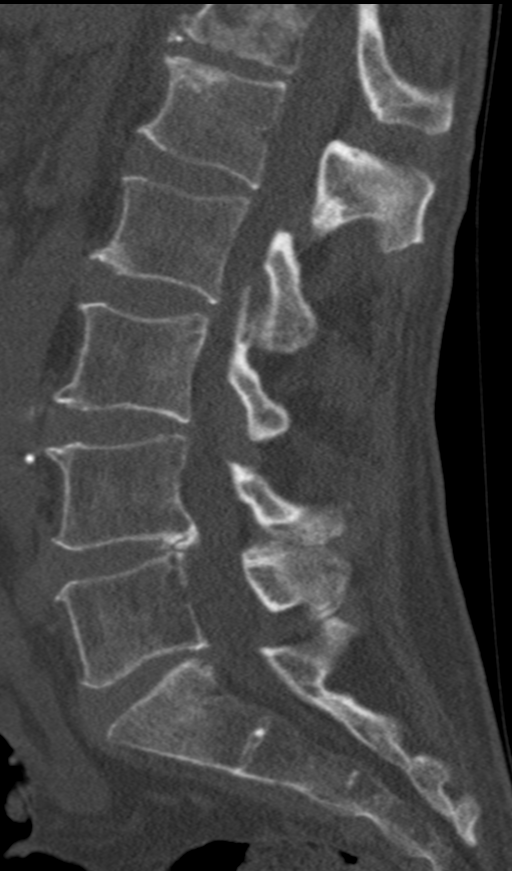
[im 42/72  bone]
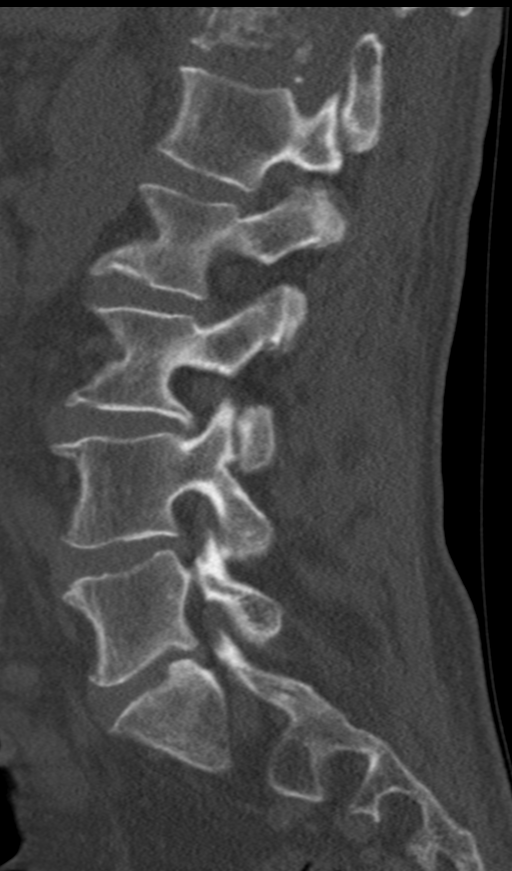
[im 48/72  bone]
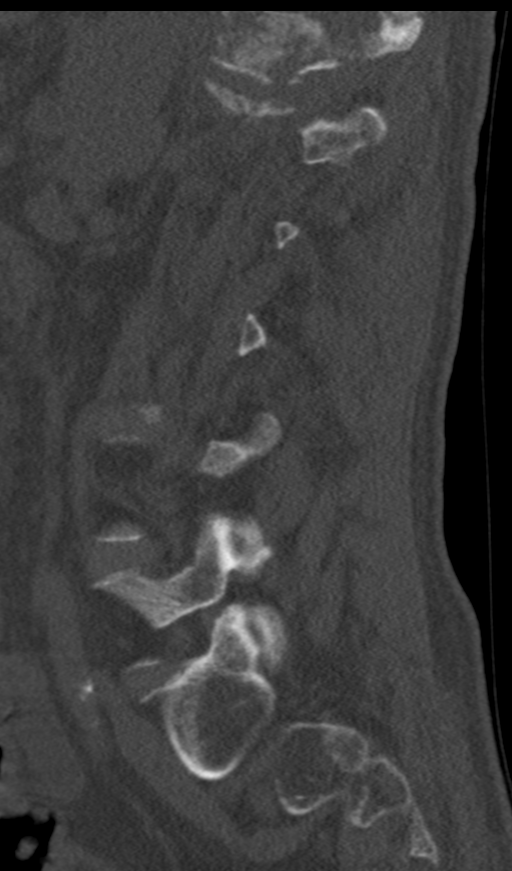

[10 of 34 positions shown; findings below may reference images not displayed]

FINDINGS: CT THORACIC SPINE FINDINGS

Alignment: Exaggerated upper thoracic kyphosis.  No listhesis.

Vertebrae: Permeative lytic destructive lesion of the vertebral body
and left-sided posterior elements at T12 with associated pathologic
fracture and mild vertebral body height loss. Evidence of
extraosseous tumor including ventral and left-sided epidural tumor
at T12. 2 cm lytic lesion in the left lateral aspect of the T9
vertebral body with extraosseous tumor extension.

Paraspinal and other soft tissues: Lower thoracic paravertebral soft
tissue suggestive of tumor, possibly with superimposed hematoma,
extending from T11-L1. Centrilobular and paraseptal emphysema.

Disc levels: Mild-to-moderate thoracic spondylosis. At least mild
spinal stenosis is suspected at T12 due to epidural tumor. Tumor
also encroaches on the left-sided neural foramina at T11-12 and
T12-L1.

CT LUMBAR SPINE FINDINGS

Segmentation: 5 lumbar type vertebrae.

Alignment: Trace retrolisthesis of L4 on L5 and L5 on S1. Slight
right convex curvature of the upper lumbar spine.

Vertebrae: No acute fracture or suspicious osseous lesion.

Paraspinal and other soft tissues: Mild abdominal aortic
atherosclerosis.

Disc levels: Mild disc degeneration, greatest at L4-5 where disc
bulging, spurring, and mild facet hypertrophy result in borderline
spinal stenosis and mild right lateral recess and right neural
foraminal stenosis.
IMPRESSION: 1. Destructive lesion involving the T12 vertebral body and
left-sided posterior elements with pathologic vertebral body
fracture and epidural tumor resulting in at least mild spinal
stenosis. Smaller lesion in the T9 vertebral body. Findings are most
concerning for metastatic disease.
2. Aortic Atherosclerosis ([IP]-[IP]) and Emphysema ([IP]-[IP]).

## 2021-04-23 IMAGING — CT CT CHEST-ABD-PELV W/ CM
2 of 5 series · 12 of 36 positions shown, 14 images · IV contrast (omnipaque)
Comparison: CT thoracic and lumbar spine earlier today.

CLINICAL DATA: Permeative lytic destructive lesion of the T12
vertebral body and left-sided posterior elements with pathologic
fracture and mild vertebral height loss, worrisome for metastatic
disease. Evaluate for primary. 2 cm lytic lesion left lateral T9
body.

EXAM:
CT CHEST, ABDOMEN, AND PELVIS WITH CONTRAST
TECHNIQUE: Multidetector CT imaging of the chest, abdomen and pelvis was
performed following the standard protocol during bolus
administration of intravenous contrast.
CONTRAST:  80mL OMNIPAQUE IOHEXOL 350 MG/ML SOLN

[Series 2: cap with · axial · 0.71mm/px · z∈[+501,+1086]mm · 9 of 144 slices shown, 11 images]
[im 14/144  mediastinal]
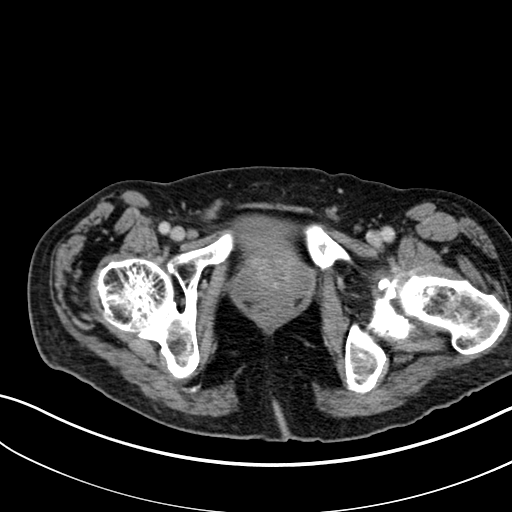
[im 14/144  bone]
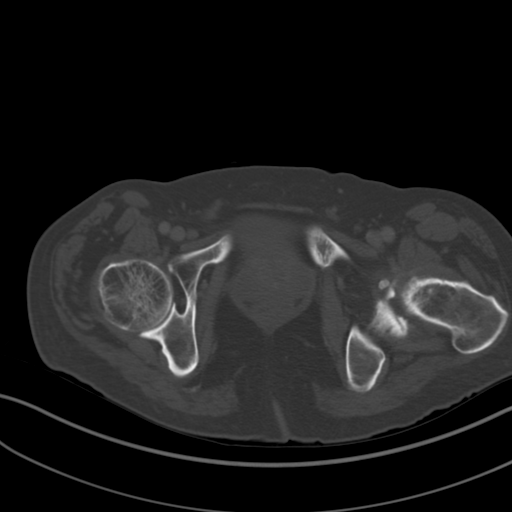
[im 27/144  mediastinal]
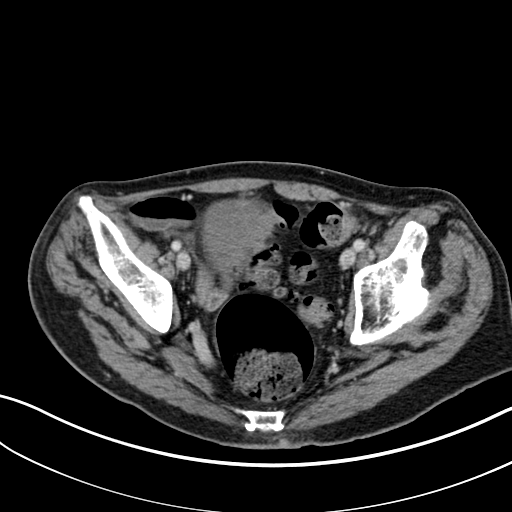
[im 40/144  mediastinal]
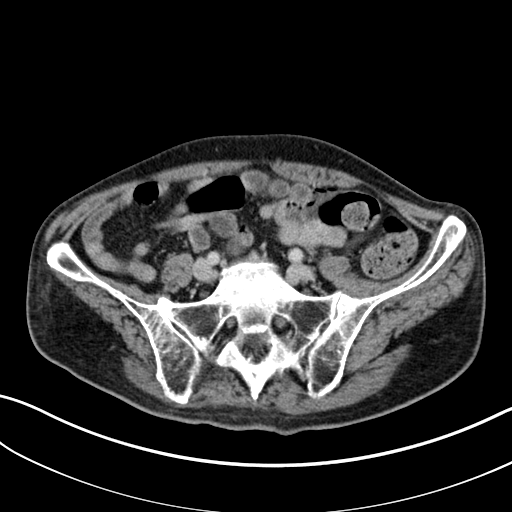
[im 53/144  mediastinal]
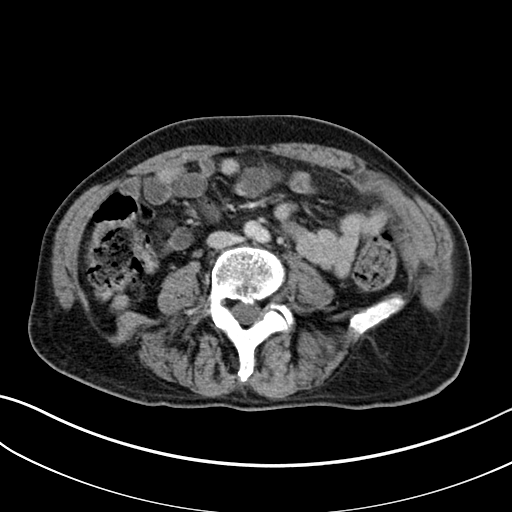
[im 79/144  mediastinal]
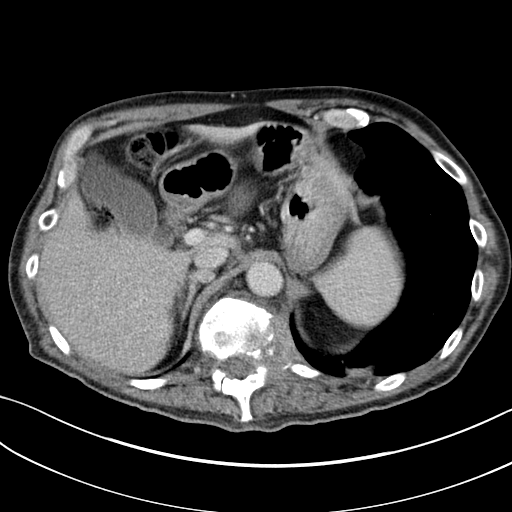
[im 92/144  mediastinal]
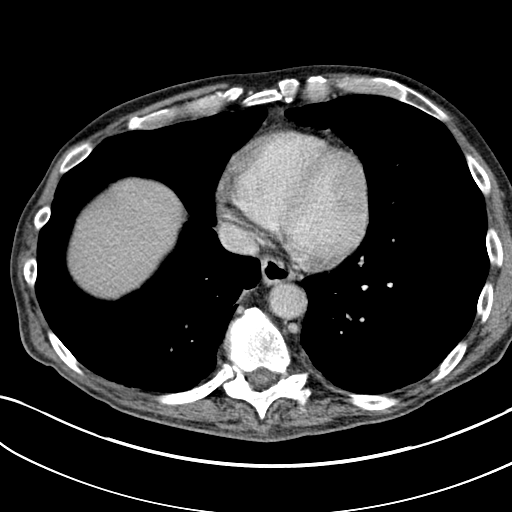
[im 105/144  mediastinal]
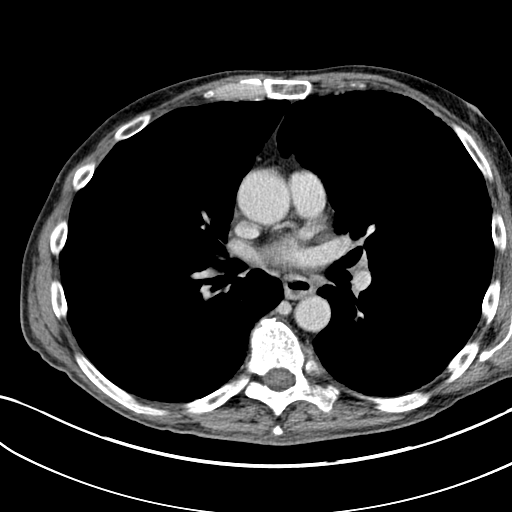
[im 118/144  mediastinal]
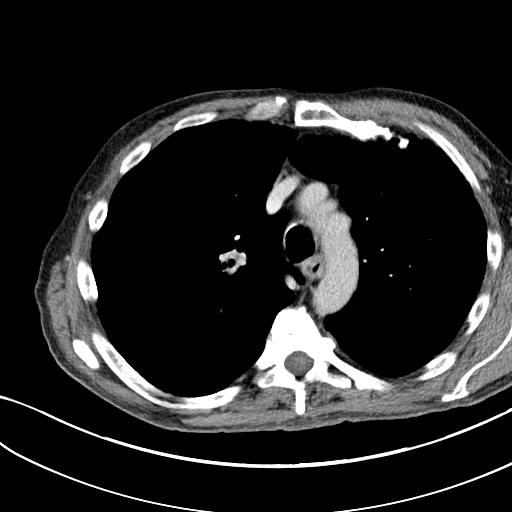
[im 131/144  mediastinal]
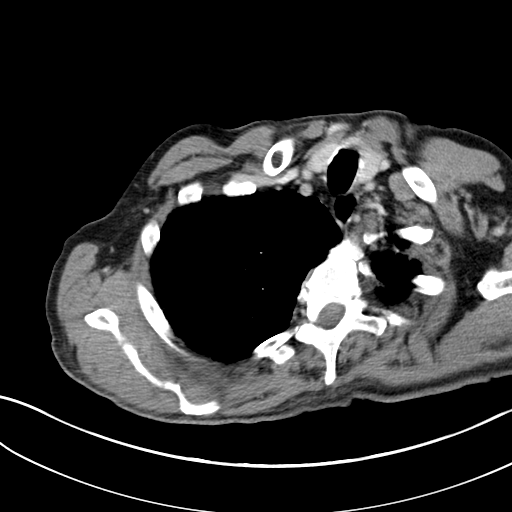
[im 131/144  bone]
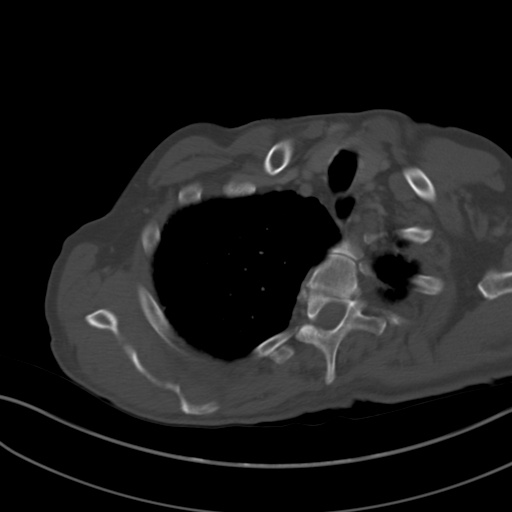

[Series 5: coronals · coronal · 0.93mm/px · 3 of 148 slices shown]
[im 30/148  mediastinal]
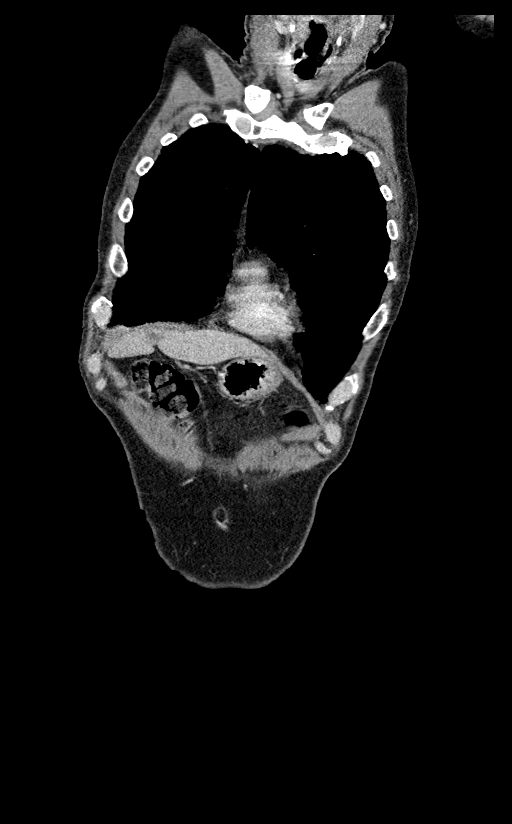
[im 59/148  mediastinal]
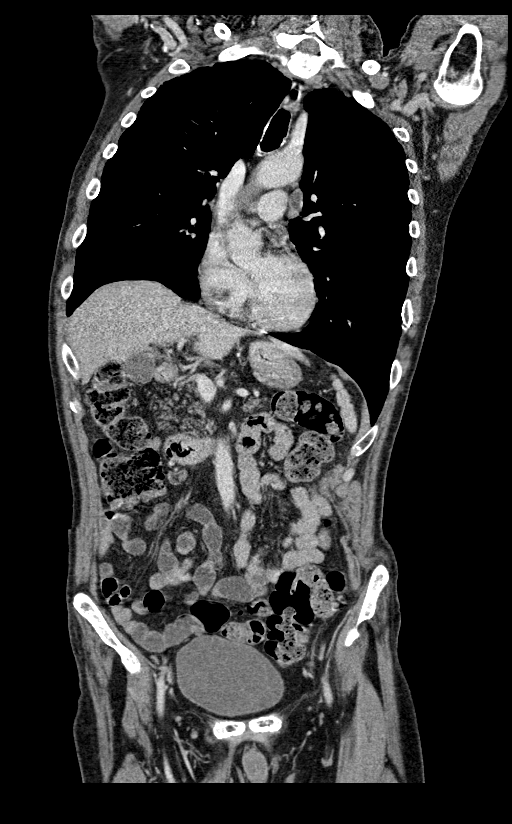
[im 89/148  mediastinal]
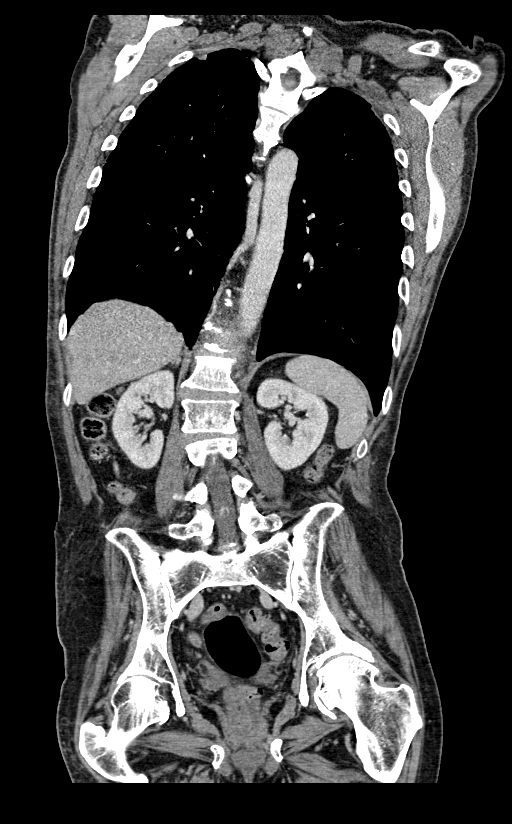

[12 of 36 positions shown; findings below may reference images not displayed]

FINDINGS: CT CHEST FINDINGS

Cardiovascular: The cardiac size is normal. There are scattered
calcifications in the LAD coronary artery. Early changes of aortic
atherosclerosis. The ascending aortic segment slightly aneurysmal
4.1 cm with the remainder within normal caliber limits and with
normal great vessels. There is no arteriovenous dilatation or
central arterial embolus. No pericardial effusion.

Mediastinum/Nodes: There are slightly prominent left hilar lymph
nodes with a lymph node measuring 1.2 cm in short axis in the
anterior left hilum on series 2 axial 36 and another more
posteriorly measuring 1 cm in short axis on axial 41.

There is no bulky, encasing or further intrathoracic adenopathy, no
mediastinal or axillary mass or thyroid lesions. There is no
tracheal or central airway filling defect or significant esophageal
thickening. Minimally small hiatal hernia.

Lungs/Pleura: No pleural effusion, thickening or pneumothorax. There
is a spiculated mass in the left upper lobe laterally at mid field
measuring 1.8 x 1.6 x 1.0 cm, with pleural stranding lateral to it.
This is most likely a primary malignancy.

Bilateral apical pleuroparenchymal scar-like opacities are noted,
with bilateral paraseptal emphysematous changes in the lungs
predominating in the upper lobes, relatively milder centrilobular
changes lung apices.

There are patchy hazy opacities in the lingula anteriorly which
could be chronic change or pneumonitis. There are scattered linear
scar-like opacities in the bases. No other nodules or infiltrates
are observed.

Musculoskeletal: Permeative lytic destructive lesion of the T12
vertebral body is again noted with pathologic mild anterior wedge
compression fracture and with epidural tumor extension ventrally
eccentric to the left partially effacing the ventral CSF and left
hemicanal and destroying the left T12 pedicle.

There is left paraspinal tumor extension at the level of the
vertebral body as well. A 2 cm T9 vertebral body lytic lesion is
again noted to the left anteriorly, with adjacent paraspinal soft
tissue thickening without intracanalicular extension.

No other focal bone lesions are seen in the thoracic osseous
structures. Asymmetric elevation right hemidiaphragm is also seen.

CT ABDOMEN PELVIS FINDINGS

Hepatobiliary: Slightly steatotic liver without mass enhancement.
The gallbladder and bile ducts unremarkable.

Pancreas: Moderately fatty atrophic without mass enhancement or
ductal dilatation.

Spleen: Unremarkable.

Adrenals/Urinary Tract: Indeterminate 1.5 cm heterogeneous right
adrenal nodule. There is no left adrenal mass , no focal renal
cortical abnormality. There are either are few small scattered
nonobstructive caliceal stones in the kidneys or early contrast
excretion mimicking stones. There is no obstructive stone or
hydroureteronephrosis no bladder thickening.

Stomach/Bowel: The gastric wall is contracted. The unopacified small
bowel demonstrating fold thickening in the duodenum and jejunum with
suspected fold thickening in the underdistended proximal stomach.
There is no small bowel obstruction or inflammation. The appendix is
normal. Moderate constipation is seen. Left colonic diverticula
without diverticulitis. There is moderate wall thickening versus
nondistention in the rectum.

Vascular/Lymphatic: Aortic atherosclerosis. No enlarged abdominal or
pelvic lymph nodes.

Reproductive: Enlarged prostate, 5.5 cm transverse with bladder base
impression.

Other: There is no free air, hemorrhage or fluid. Small umbilical
fat hernia.

Musculoskeletal: No regional destructive bone lesions. Moderate
left-greater-than-right hip DJD. Degenerative changes and mild
dextroscoliosis lumbar spine.
IMPRESSION: 1. 1.8 x 1.6 x 1.0 cm left upper lobe spiculated nodule with pleural
stranding, which should be considered a primary neoplasm until
proven otherwise. There are 2 slightly prominent left hilar lymph
nodes but no further chest adenopathy.
2. 2 cm lytic lesion T9 vertebral body with extravertebral
extension, with permeative lytic destructive lesion in the T12
vertebral body and left pedicle with intracanalicular epidural
extension partially effacing the left hemicanal and ventral CSF,
findings most likely metastatic in etiology.
3. Patchy haziness in the anterior lingula which could be chronic
change or pneumonitis.
4. Indeterminate heterogeneous 1.5 cm right adrenal mass, possible
metastasis. PET-CT may be helpful.
5. COPD and scarring change.
6. Aortic and coronary artery atherosclerosis with 4.1 cm slightly
aneurysmal ascending segment. No AAA.
7. Moderate thickening of the rectum versus underdistention. Further
evaluation recommended.
8. Enlarged prostate.
9. Constipation, and additional findings suggesting gastroenteritis.
No small bowel obstruction or inflammatory changes.
10. No other CT evidence of metastatic disease. Additional findings
discussed above.

## 2021-04-23 IMAGING — CR DG LUMBAR SPINE COMPLETE 4+V
5 series · 5 of 5 positions shown · non-contrast
Comparison: None.

CLINICAL DATA: Back pain.

EXAM:
LUMBAR SPINE - COMPLETE 4+ VIEW

[t lumbar spine ap]
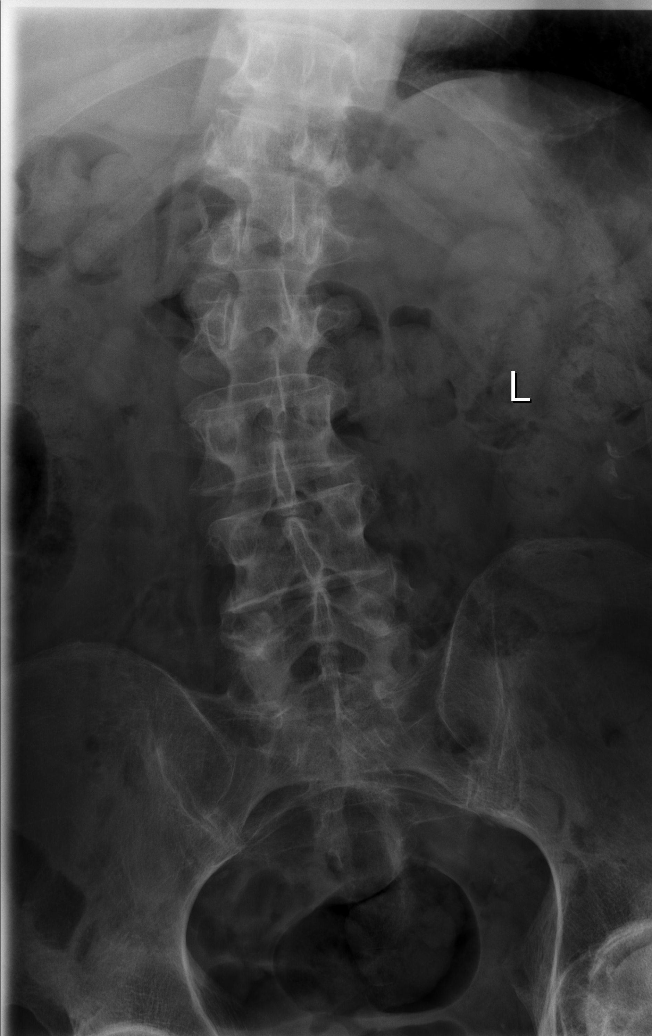

[t lumbar spine obl (1 of 2)]
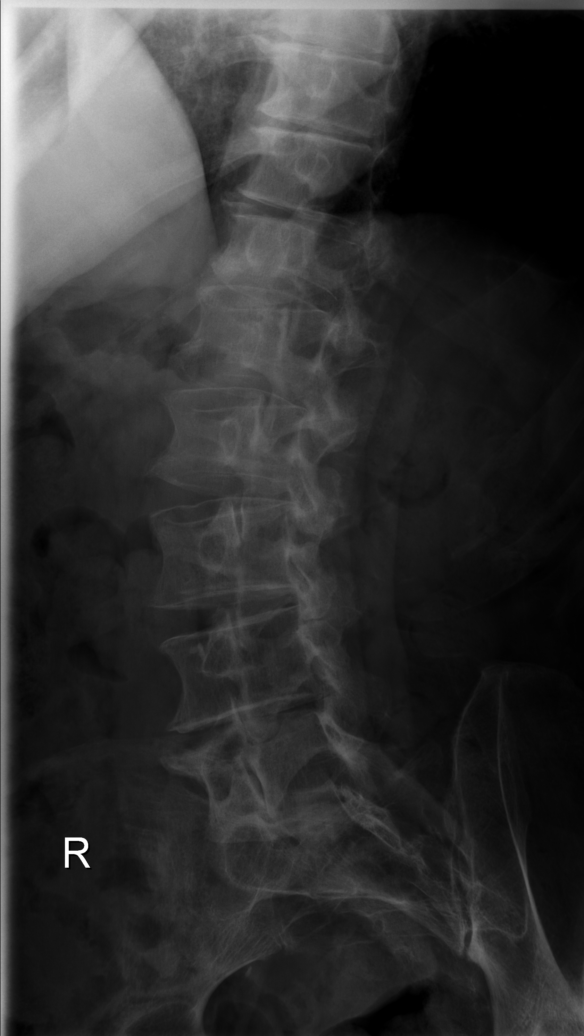

[t lumbar spine obl (2 of 2)]
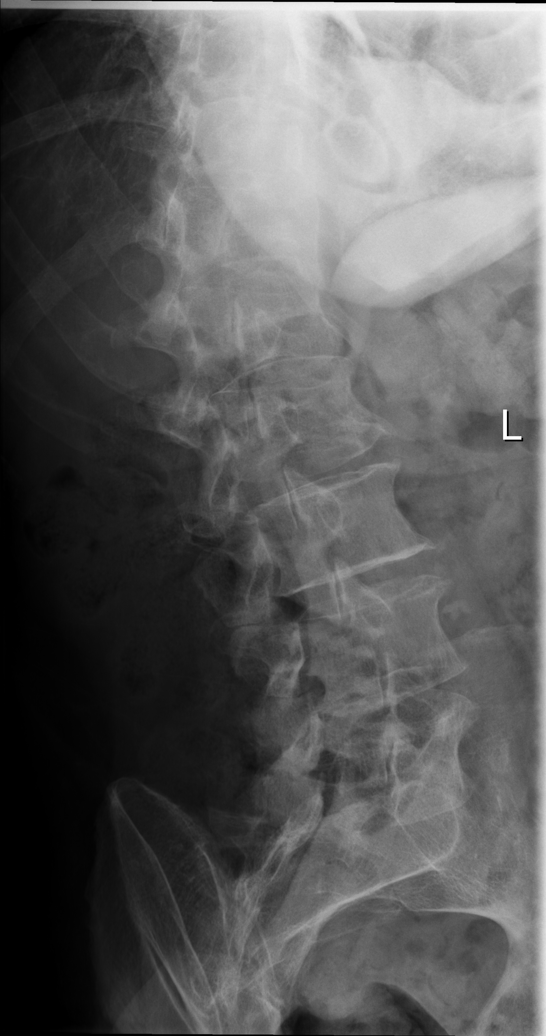

[t lumbar spine lat]
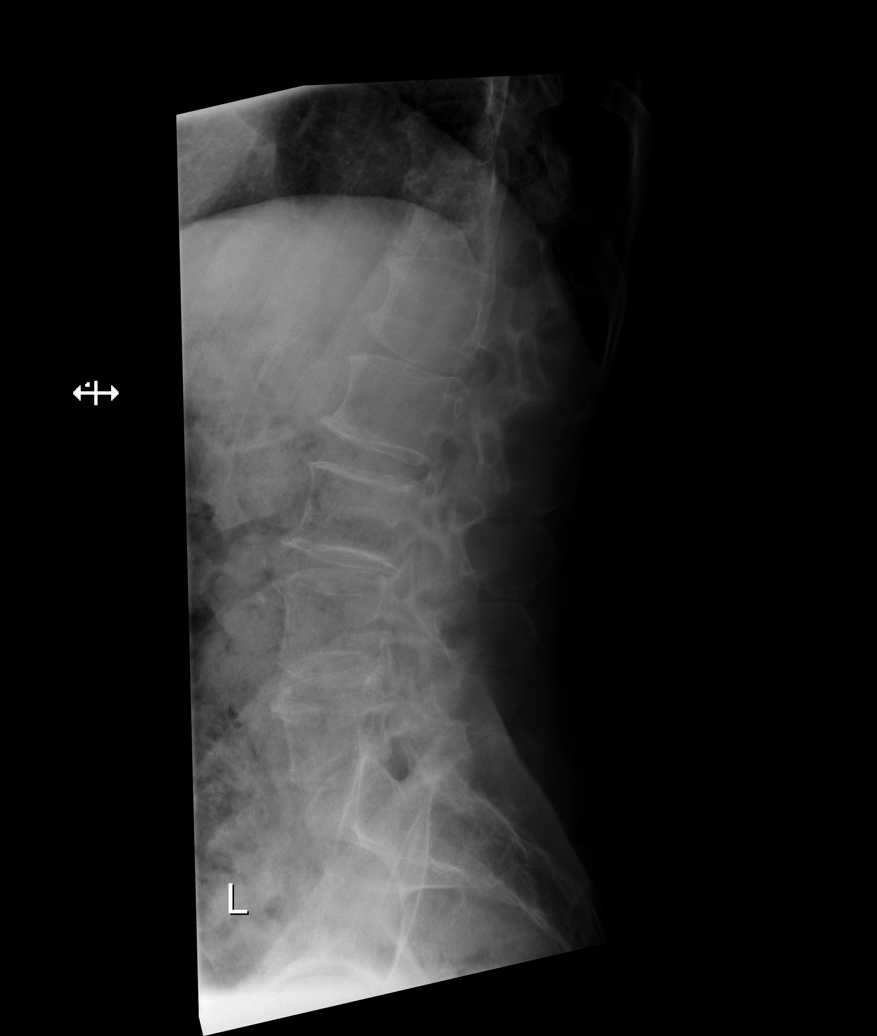

[t lumbar l-5 s-1 spot]
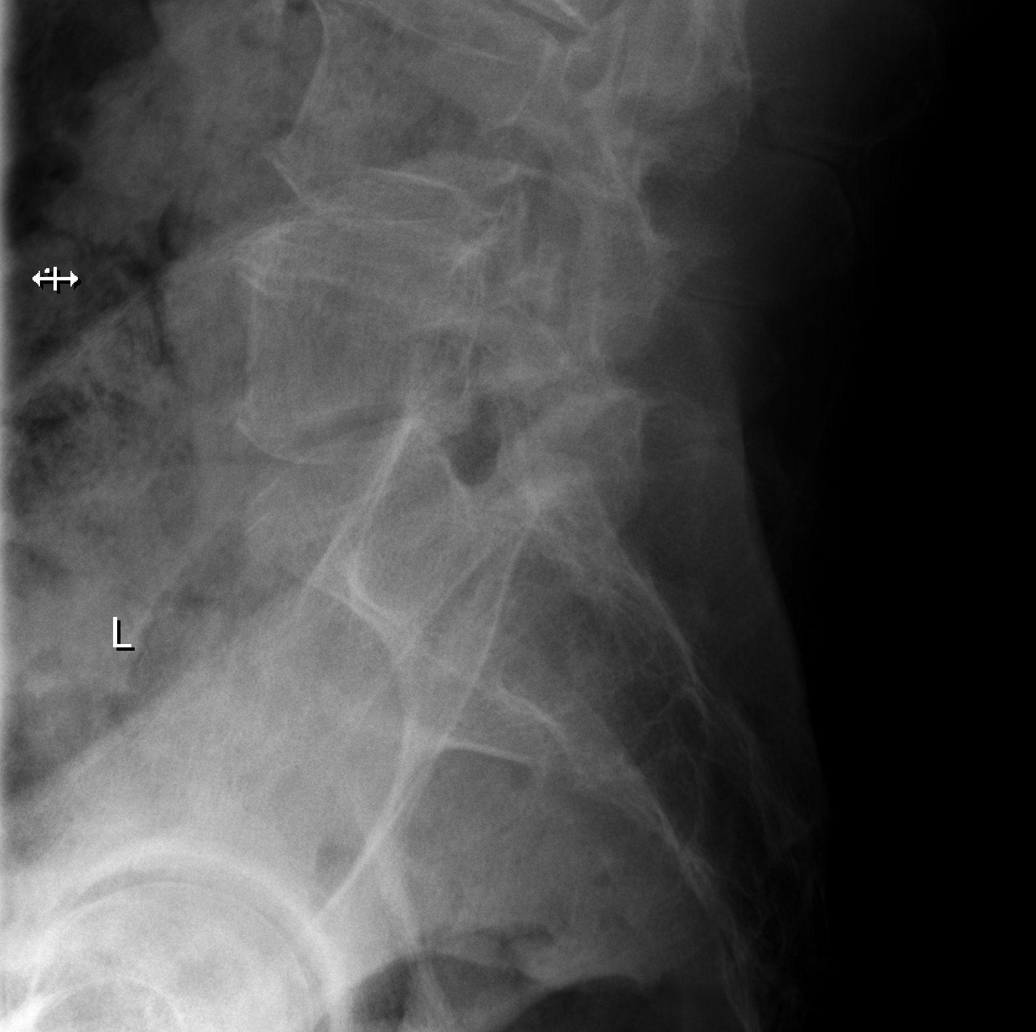

[5 of 5 positions shown; findings below may reference images not displayed]

FINDINGS: There is dextroconvex curvature of the lumbar spine. There is no
evidence for acute fracture or dislocation of the lumbar spine.
There is questionable mild compression deformity of T12, age
indeterminate. Mild degenerative endplate osteophytes are seen
throughout the lumbar spine. Disc spaces are grossly maintained.
Soft tissues are within normal limits.
IMPRESSION: 1. Questionable mild compression deformity of T12, age
indeterminate. Correlate for point tenderness. Consider dedicated
imaging of this level.
2. Levoconvex curvature of the lumbar spine.
3. Mild degenerative changes.

## 2021-04-23 IMAGING — CT CT T SPINE W/O CM
3 of 4 series · 9 of 35 positions shown, 11 images · non-contrast
Comparison: Lumbar spine radiographs [DATE]

CLINICAL DATA: Worsening chronic back pain.

EXAM:
CT THORACIC AND LUMBAR SPINE WITHOUT CONTRAST
TECHNIQUE: Multidetector CT imaging of the thoracic and lumbar spine was
performed without contrast. Multiplanar CT image reconstructions
were also generated.

[Series 4: t spine st · axial · 0.39mm/px · z∈[+1630,+1630]mm · 1 of 170 slices shown, 2 images]
[im 85/170  soft-tissue]
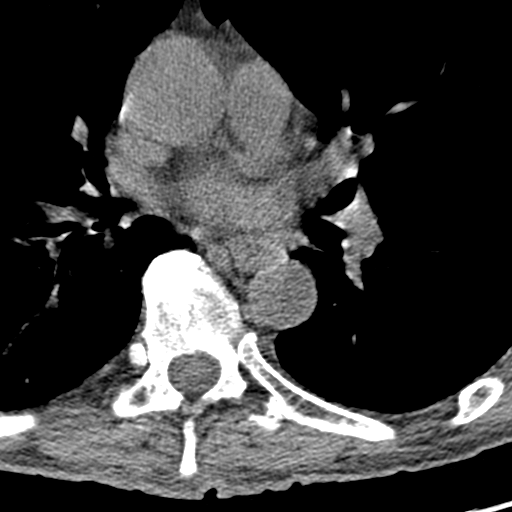
[im 85/170  bone]
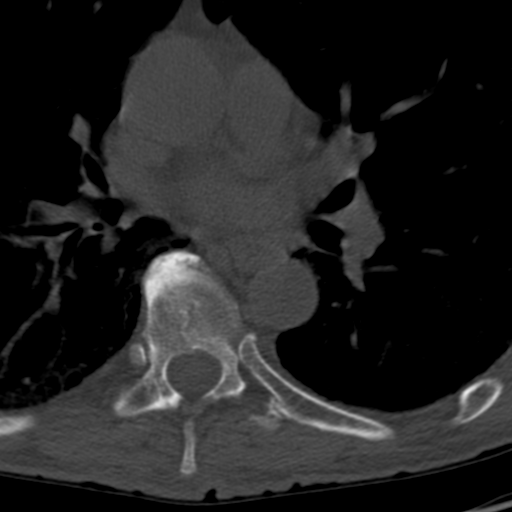

[Series 9: coronal bone · coronal · 0.31mm/px · 3 of 79 slices shown]
[im 16/79  bone]
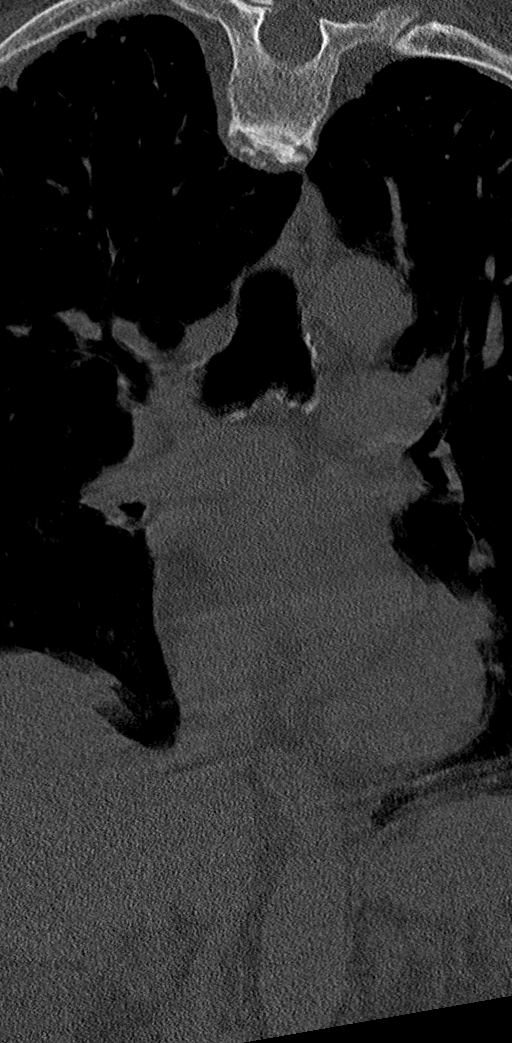
[im 32/79  bone]
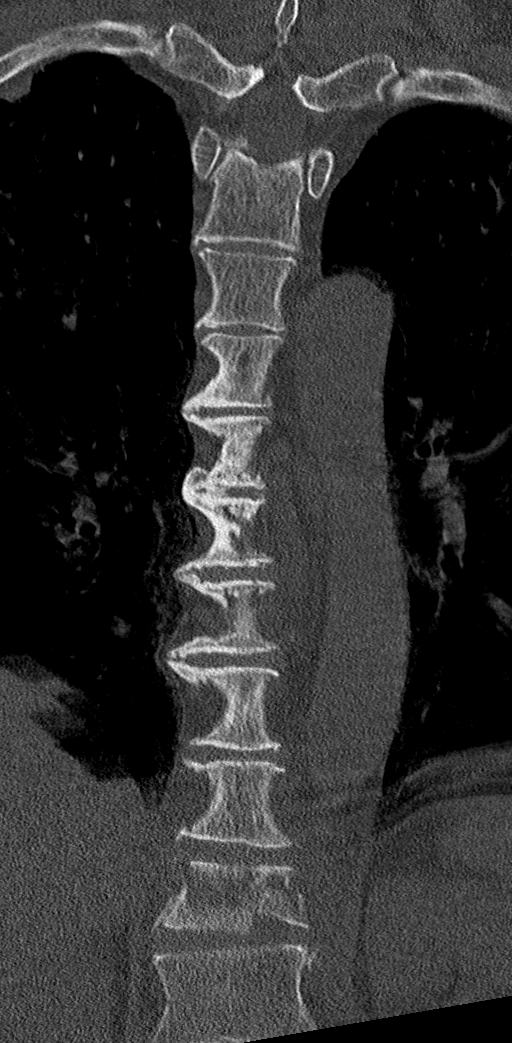
[im 47/79  bone]
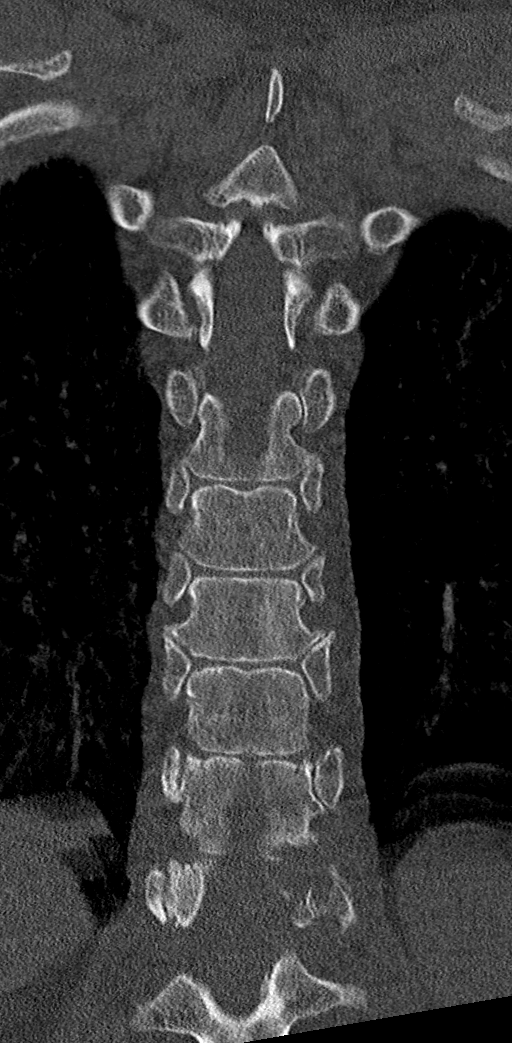

[Series 10: sagittal bone · sagittal · 0.34mm/px · 5 of 55 slices shown, 6 images]
[im 19/55  bone]
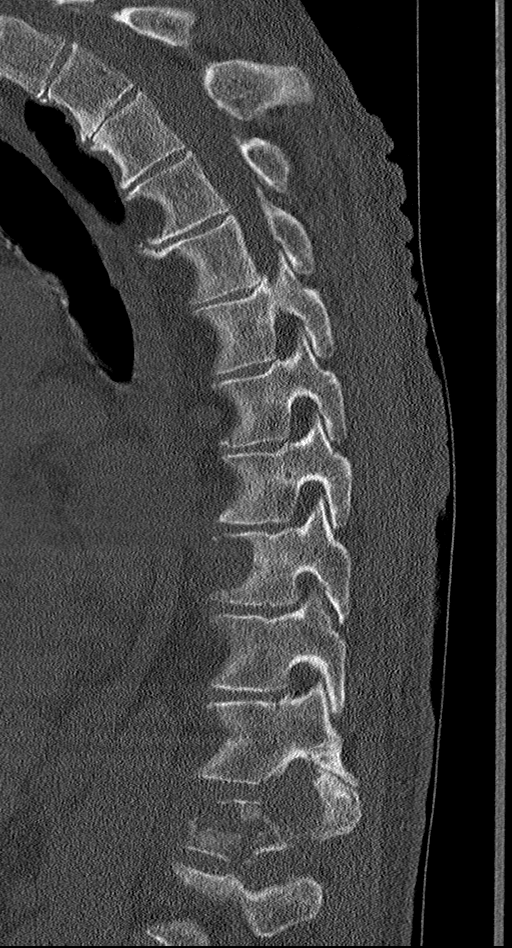
[im 23/55  bone]
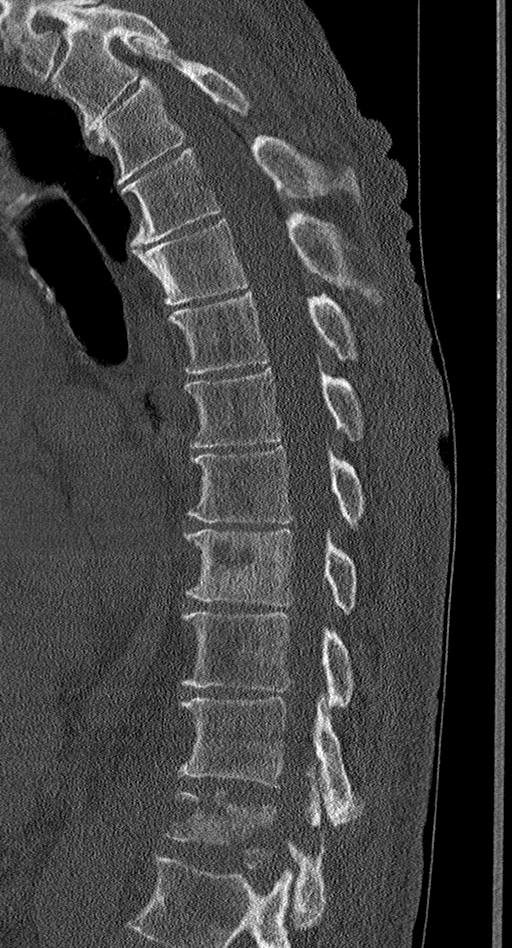
[im 28/55  soft-tissue]
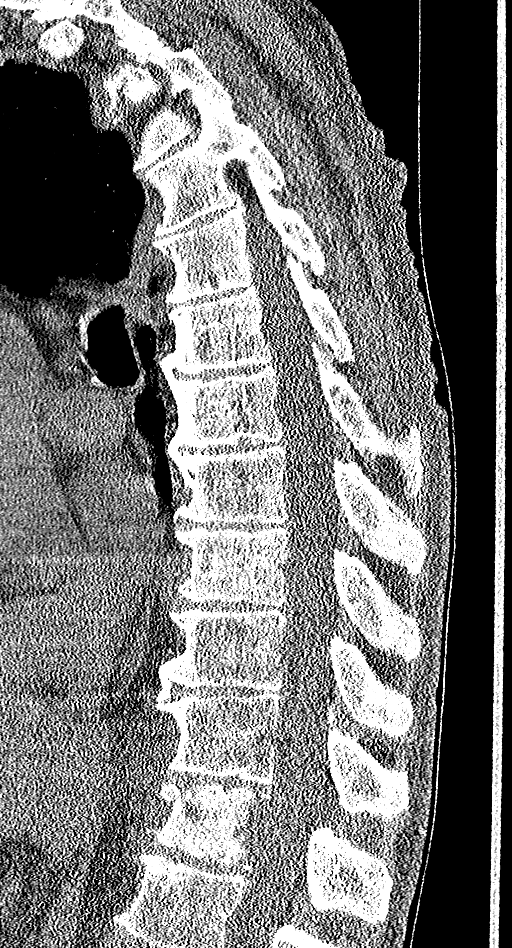
[im 28/55  bone]
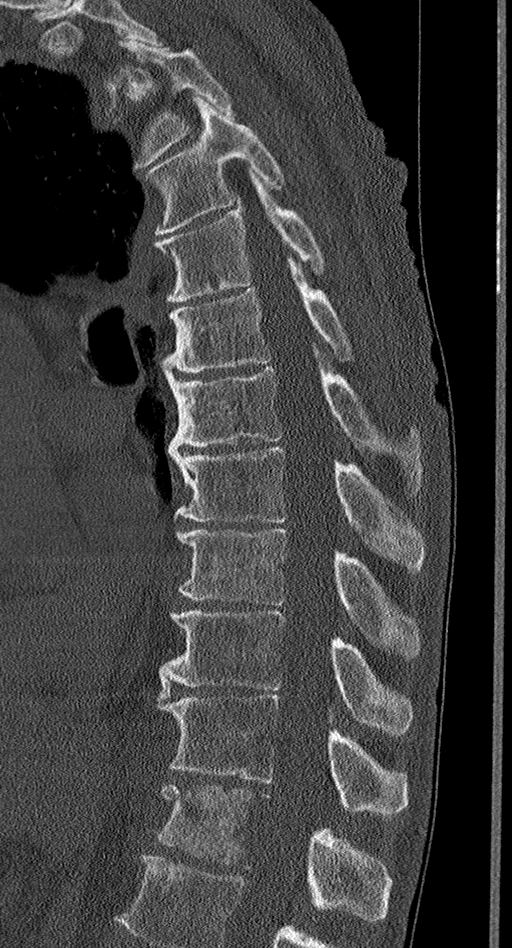
[im 32/55  bone]
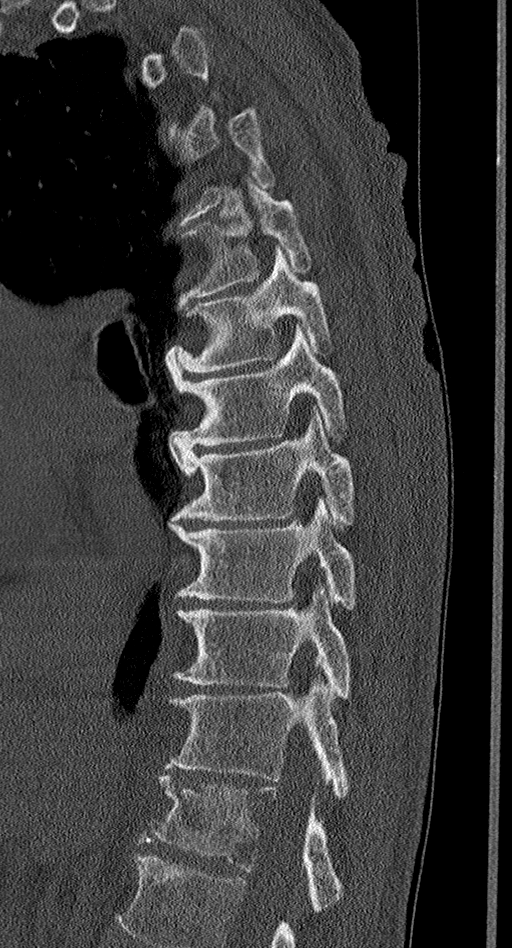
[im 37/55  bone]
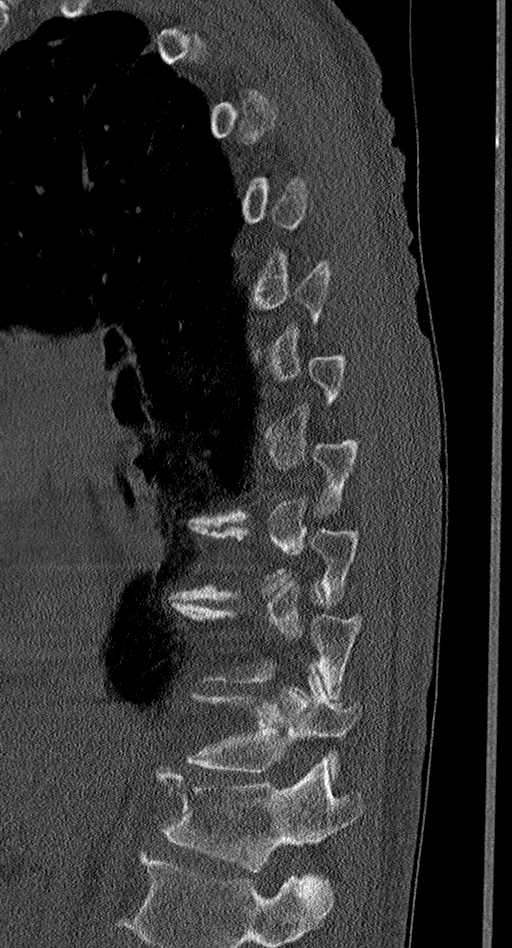

[9 of 35 positions shown; findings below may reference images not displayed]

FINDINGS: CT THORACIC SPINE FINDINGS

Alignment: Exaggerated upper thoracic kyphosis.  No listhesis.

Vertebrae: Permeative lytic destructive lesion of the vertebral body
and left-sided posterior elements at T12 with associated pathologic
fracture and mild vertebral body height loss. Evidence of
extraosseous tumor including ventral and left-sided epidural tumor
at T12. 2 cm lytic lesion in the left lateral aspect of the T9
vertebral body with extraosseous tumor extension.

Paraspinal and other soft tissues: Lower thoracic paravertebral soft
tissue suggestive of tumor, possibly with superimposed hematoma,
extending from T11-L1. Centrilobular and paraseptal emphysema.

Disc levels: Mild-to-moderate thoracic spondylosis. At least mild
spinal stenosis is suspected at T12 due to epidural tumor. Tumor
also encroaches on the left-sided neural foramina at T11-12 and
T12-L1.

CT LUMBAR SPINE FINDINGS

Segmentation: 5 lumbar type vertebrae.

Alignment: Trace retrolisthesis of L4 on L5 and L5 on S1. Slight
right convex curvature of the upper lumbar spine.

Vertebrae: No acute fracture or suspicious osseous lesion.

Paraspinal and other soft tissues: Mild abdominal aortic
atherosclerosis.

Disc levels: Mild disc degeneration, greatest at L4-5 where disc
bulging, spurring, and mild facet hypertrophy result in borderline
spinal stenosis and mild right lateral recess and right neural
foraminal stenosis.
IMPRESSION: 1. Destructive lesion involving the T12 vertebral body and
left-sided posterior elements with pathologic vertebral body
fracture and epidural tumor resulting in at least mild spinal
stenosis. Smaller lesion in the T9 vertebral body. Findings are most
concerning for metastatic disease.
2. Aortic Atherosclerosis ([IP]-[IP]) and Emphysema ([IP]-[IP]).

## 2021-04-23 MED ORDER — ONDANSETRON HCL 4 MG/2ML IJ SOLN: 4.0000 mg | Freq: Four times a day (QID) | INTRAMUSCULAR | Status: DC | PRN

## 2021-04-23 MED ORDER — LORAZEPAM 1 MG PO TABS: 1.0000 mg | ORAL_TABLET | ORAL | Status: AC | PRN

## 2021-04-23 MED ORDER — NICOTINE 14 MG/24HR TD PT24
14.0000 mg | MEDICATED_PATCH | TRANSDERMAL | Status: DC
  Administered 2021-04-24 – 2021-05-04 (×9): 14 mg via TRANSDERMAL
  Filled 2021-04-23 (×14): qty 1

## 2021-04-23 MED ORDER — ADULT MULTIVITAMIN W/MINERALS CH
1.0000 | ORAL_TABLET | Freq: Every day | ORAL | Status: DC
  Administered 2021-04-23 – 2021-05-13 (×21): 1 via ORAL
  Filled 2021-04-23 (×21): qty 1

## 2021-04-23 MED ORDER — ACETAMINOPHEN 325 MG PO TABS
650.0000 mg | ORAL_TABLET | Freq: Four times a day (QID) | ORAL | Status: DC | PRN
  Administered 2021-04-24: 650 mg via ORAL
  Filled 2021-04-23: qty 2

## 2021-04-23 MED ORDER — SODIUM CHLORIDE 0.9 % IV BOLUS
500.0000 mL | Freq: Once | INTRAVENOUS | Status: AC
  Administered 2021-04-23: 500 mL via INTRAVENOUS

## 2021-04-23 MED ORDER — ONDANSETRON HCL 4 MG PO TABS: 4.0000 mg | ORAL_TABLET | Freq: Four times a day (QID) | ORAL | Status: DC | PRN

## 2021-04-23 MED ORDER — FOLIC ACID 1 MG PO TABS
1.0000 mg | ORAL_TABLET | Freq: Every day | ORAL | Status: DC
  Administered 2021-04-23 – 2021-05-13 (×21): 1 mg via ORAL
  Filled 2021-04-23 (×21): qty 1

## 2021-04-23 MED ORDER — IOHEXOL 350 MG/ML SOLN
80.0000 mL | Freq: Once | INTRAVENOUS | Status: AC | PRN
  Administered 2021-04-23: 80 mL via INTRAVENOUS

## 2021-04-23 MED ORDER — SODIUM CHLORIDE 0.9 % IV BOLUS
1000.0000 mL | Freq: Once | INTRAVENOUS | Status: AC
  Administered 2021-04-23: 1000 mL via INTRAVENOUS

## 2021-04-23 MED ORDER — HYDROMORPHONE HCL 1 MG/ML IJ SOLN: 1.0000 mg | INTRAMUSCULAR | Status: DC | PRN

## 2021-04-23 MED ORDER — PANTOPRAZOLE SODIUM 40 MG PO TBEC
40.0000 mg | DELAYED_RELEASE_TABLET | Freq: Every day | ORAL | Status: DC
  Administered 2021-04-23 – 2021-05-13 (×21): 40 mg via ORAL
  Filled 2021-04-23 (×21): qty 1

## 2021-04-23 MED ORDER — MORPHINE SULFATE (PF) 4 MG/ML IV SOLN
4.0000 mg | Freq: Once | INTRAVENOUS | Status: AC
  Administered 2021-04-23: 4 mg via INTRAVENOUS
  Filled 2021-04-23: qty 1

## 2021-04-23 MED ORDER — THIAMINE HCL 100 MG PO TABS
100.0000 mg | ORAL_TABLET | Freq: Every day | ORAL | Status: DC
  Administered 2021-04-23 – 2021-05-13 (×21): 100 mg via ORAL
  Filled 2021-04-23 (×21): qty 1

## 2021-04-23 MED ORDER — LORAZEPAM 2 MG/ML IJ SOLN: 1.0000 mg | INTRAMUSCULAR | Status: AC | PRN

## 2021-04-23 MED ORDER — DEXAMETHASONE SODIUM PHOSPHATE 4 MG/ML IJ SOLN
4.0000 mg | Freq: Four times a day (QID) | INTRAMUSCULAR | Status: DC
  Administered 2021-04-24 – 2021-04-30 (×26): 4 mg via INTRAVENOUS
  Filled 2021-04-23 (×26): qty 1

## 2021-04-23 MED ORDER — SODIUM CHLORIDE 0.9 % IV SOLN: INTRAVENOUS | Status: AC

## 2021-04-23 MED ORDER — DEXAMETHASONE SODIUM PHOSPHATE 10 MG/ML IJ SOLN
12.0000 mg | Freq: Once | INTRAMUSCULAR | Status: AC
  Administered 2021-04-23: 12 mg via INTRAVENOUS
  Filled 2021-04-23: qty 2

## 2021-04-23 MED ORDER — ACETAMINOPHEN 650 MG RE SUPP: 650.0000 mg | Freq: Four times a day (QID) | RECTAL | Status: DC | PRN

## 2021-04-23 MED ORDER — ENOXAPARIN SODIUM 40 MG/0.4ML IJ SOSY
40.0000 mg | PREFILLED_SYRINGE | INTRAMUSCULAR | Status: DC
  Administered 2021-04-23: 40 mg via SUBCUTANEOUS
  Filled 2021-04-23: qty 0.4

## 2021-04-23 NOTE — ED Provider Notes (Signed)
[facility] DEPT Provider Note   CSN: [id number] Arrival date & time: 04/23/21  1528     History No chief complaint on file.   Christopher Dawson is a 65 y.o. male.  The history is provided by the patient and medical records. No language interpreter was used.   65 year old male significant history of chronic lumbar radiculopathy, Marfan syndrome, anxiety, alcohol abuse brought here via EMS for evaluation of back pain.  Patient states he has history of back pain that he has been dealing with for a long time however for the past 6 months he report progressively worsening pain to his back.  Pain is sharp throbbing aching across his low back pain does not radiates down his leg.  Increasing pain with movement.  Patient states he normally walks with a cane but he tries not to walk much because his leg is weak and tremulous and he is vulnerable to fall.  States he fell a few times within the past few months none recently.  He does not take any medication at home to help with his back pain.  There is no new inciting factor prompting this ER visit.  He denies having fever or dysuria bowel bladder incontinence or saddle anesthesia.  He lives at home by himself.  He does not have an orthopedist at this time`  Past Medical History:  Diagnosis Date   Anxiety    Depression    Lumbar radiculopathy, chronic 2008   Marfan syndrome    Neurosis, depressive    Shortening, leg, congenital     Patient Active Problem List   Diagnosis Date Noted   Elevated BP without diagnosis of hypertension 11/25/2020   Poor social situation 11/25/2020   Alcohol withdrawal syndrome without complication (HCC)    H/O alcohol abuse 05/09/2017   Hypocalcemia 05/09/2017   Leukopenia 05/09/2017   Thrombocytopenia ([place]) 05/09/2014   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   Abnormality of gait 04/12/2013   Tobacco use disorder 07/04/2012   GAD (generalized anxiety disorder) 07/04/2012   Major depressive  disorder, recurrent episode ([place]) 07/04/2012   Hyperlipidemia 06/24/2012    Past Surgical History:  Procedure Laterality Date   LEG SURGERY         Family History  Problem Relation Age of Onset   Heart disease Mother     Social History   Tobacco Use   Smoking status: Every Day    Types: Cigarettes   Smokeless tobacco: Never  Vaping Use   Vaping Use: Never used  Substance Use Topics   Alcohol use: Yes    Alcohol/week: 1.0 standard drink    Types: 1 Cans of beer per week    Comment: 3-4 beers daily   Drug use: No    Home Medications Prior to Admission medications   Medication Sig Start Date End Date Taking? Authorizing Provider  clomiPRAMINE (ANAFRANIL) 25 MG capsule May take one to two tablets as needed at bedtime. 12/07/20   [name], MD  clomiPRAMINE (ANAFRANIL) 50 MG capsule TAKE 1 CAPSULE(50 MG) BY MOUTH AT BEDTIME 03/22/21   [name], MD  escitalopram (LEXAPRO) 10 MG tablet Take one at night for 1 week and take 2 at night. 11/25/20   [name], MD  Multiple Vitamins-Minerals (CENTRUM SILVER 50+MEN) TABS Take 1 tablet by mouth daily with breakfast.    [provider]    Allergies    Codeine  Review of Systems   Review of Systems  All other systems  reviewed and are negative.  Physical Exam Updated Vital Signs BP (!) 138/107    Pulse (!) 105    Temp 98.2 F (36.8 C) (Oral)    Resp 18    SpO2 99%   Physical Exam Vitals and nursing note reviewed.  Constitutional:      General: He is not in acute distress.    Appearance: He is well-developed.     Comments: Patient appears unkept however in no acute discomfort  HENT:     Head: Atraumatic.  Eyes:     Conjunctiva/sclera: Conjunctivae normal.  Cardiovascular:     Rate and Rhythm: Normal rate and regular rhythm.     Pulses: Normal pulses.     Heart sounds: Normal heart sounds.  Pulmonary:     Breath sounds: Normal breath sounds. No wheezing.  Abdominal:      Palpations: Abdomen is soft.     Tenderness: There is no abdominal tenderness.  Musculoskeletal:     Cervical back: Neck supple.     Comments: No reproducible midline spine tenderness on exam.  Negative straight leg raise bilaterally.  Skin:    Capillary Refill: Capillary refill takes less than 2 seconds.     Findings: No rash.  Neurological:     Mental Status: He is alert. Mental status is at baseline.  Psychiatric:        Mood and Affect: Mood normal.    ED Results / Procedures / Treatments   Labs (all labs ordered are listed, but only abnormal results are displayed) Labs Reviewed - No data to display  EKG None  Radiology No results found.  Procedures Procedures   Medications Ordered in ED Medications - No data to display  ED Course  I have reviewed the triage vital signs and the nursing notes.  Pertinent labs & imaging results that were available during my care of the patient were reviewed by me and considered in my medical decision making (see chart for details).  Clinical Course as of 04/23/21 1754  Sat Apr 23, 2021  1751 Chronic back pain worsening for months. Does not take meds at home. Uses cane at baseline. Low suspicion for cauda equina. FU on CT. If imaging comes back if ambulates can dc home. If unable to ambulate plan to PT/OT/ placement. [BH]    Clinical Course User Index [BH] Henderly, Britni A, PA-C   MDM Rules/Calculators/A&P                         BP (!) 138/107    Pulse (!) 105    Temp 98.2 F (36.8 C) (Oral)    Resp 18    SpO2 99%      Final Clinical Impression(s) / ED Diagnoses Final diagnoses:  None    Rx / DC Orders ED Discharge Orders     None      4:26 PM Patient here with complaints of low back pain which is an ongoing issue but apparently worse within the past 6 months.  States he normally have his groceries delivered but he would have to crawl around in his house just to move about because of bilateral leg tremors and  weakness.  He does not endorse bowel bladder incontinence or saddle anesthesia.  He did report several falls and therefore I will obtain an L-spine x-ray.  He is able to move his lower extremities on exam with equal strength.  Low suspicion for cauda equina.  He appears unkept.  Work-up initiated. Care discussed with Dr. Eulis Foster.    5:50 PM Initial L spine xray shows questionable mild compression deformity of T12, which is age indeterminate.  With pt having multiple falls and not able to ambulate adequately, will obtain thoracic and lumbar CT for further assessment.  Will check labs  5:54 PM Pt sign out to oncoming provider who will f/u on CT result and labs.  I have placed ambulatory referral for Social work.  Pt will benefit from PT/OT assessment, social work for support and possibly placement if he is unable to ambulate.    Pt has poor foot care and toenails care.  Will give referral to podiatry.     Domenic Moras, PA-C 04/23/21 1755    Daleen Bo, MD 04/23/21 [phone]

## 2021-04-23 NOTE — ED Provider Notes (Signed)
°  Face-to-face evaluation   History: He presents for evaluation of back pain for several months, which is severe and prevents him from walking without assistive devices.  He is currently using a cane.  He has not had a exacerbation of his chronic back pain for many years and typically is able to manage by "doing exercises."  Physical exam: Alert, elderly, disheveled, with poor hygiene.  No focal weakness.  He is able to elevate arms and legs off the stretcher independently.  There is no dysarthria or aphasia.     MDM: Evaluation for  Chief Complaint  Patient presents with   Back Pain     Patient with apparent chronic back pain, worsening, not recently evaluated by a sports medicine provider, orthopedist or PCP.  He does not have clinical evidence for spinal myelopathy.  Imaging was ordered to clarify current status of spine.  Medical screening examination/treatment/procedure(s) were conducted as a shared visit with non-physician practitioner(s) and myself.  I personally evaluated the patient during the encounter    Daleen Bo, MD 04/23/21 2234

## 2021-04-23 NOTE — ED Triage Notes (Signed)
Patient BIBA. Patient has had increasing back pain for 5 months, has been unable to stand the last three, so has been crawling to get around home.   Hx of Cerebral Palsy Hx of disc problem  BP: 160/98 HR: 110 SPO2: 98 RA RR: 18

## 2021-04-23 NOTE — ED Notes (Signed)
Pt reminded that a urine sample Is needed. He states he will use urinal when he has to urinate. Urinal at bedside.

## 2021-04-23 NOTE — ED Notes (Signed)
When providing a urine sample the patient urinated on himself but would not allow this RN to change his pants, brief or bottom sheet. Risks of skin breakdown explained to him. Still refused. New top sheet provided.

## 2021-04-23 NOTE — H&P (Signed)
History and Physical    Christopher Dawson WJX:[id number] DOB: 12-22-1955 DOA: 04/23/2021  PCP: [name], MD   Patient coming from: Home  I have personally briefly reviewed patient's old medical records in [place]  Chief Complaint: Intractable back pain  HPI: Christopher Dawson is a 65 y.o. male with medical history significant of anxiety/depression, chronic lumbar radiculopathy, tobacco abuse, alcohol abuse and ongoing physical deconditioning for the last 6 months; who presented to the emergency department secondary to intractable back pain with inability to ambulate.  Patient has not been able to stand up to cook his meals, buy groceries or appropriately provide hygiene to himself.  Denies bowel or bladder incontinence, saddle paresthesia or focal deficits.  Patient able to move all limbs spontaneously; expressing decreased muscular strength in his legs.  No fever, no chills, no nausea, no vomiting, no chest pain, no palpitations, no blurred vision or any other complaints.  Patient is vaccinated against COVID, but not posted.  COVID PCR negative in the ED.  ED Course: Work-up demonstrating what appears to be positive T12 and T9 vertebra process/distraction with concern for metastatic malignant lesions.  Case discussed with neurosurgery who recommended MRI evaluation to determine any further interventions.  Also oncology service, recommendations given for initiation of Decadron and to discuss with neurosurgery (they would like to be reconsulted in a.m. to formally see patient).  TRH contacted to place patient in the hospital for further evaluation and management.  Review of Systems: As per HPI otherwise all other systems reviewed and are negative.  Past Medical History:  Diagnosis Date   Anxiety    Depression    Lumbar radiculopathy, chronic 2008   Marfan syndrome    Neurosis, depressive    Shortening, leg, congenital     Past Surgical History:  Procedure Laterality  Date   LEG SURGERY      Social History  reports that he has been smoking cigarettes. He has never used smokeless tobacco. He reports current alcohol use of about 1.0 standard drink per week. He reports that he does not use drugs.  Allergies  Allergen Reactions   Codeine Other (See Comments)    Made me feel "spaced out"    Family History  Problem Relation Age of Onset   Heart disease Mother     Prior to Admission medications   Medication Sig Start Date End Date Taking? Authorizing Provider  clomiPRAMINE (ANAFRANIL) 25 MG capsule May take one to two tablets as needed at bedtime. 12/07/20   [name], MD  clomiPRAMINE (ANAFRANIL) 50 MG capsule TAKE 1 CAPSULE(50 MG) BY MOUTH AT BEDTIME 03/22/21   [name], MD  escitalopram (LEXAPRO) 10 MG tablet Take one at night for 1 week and take 2 at night. 11/25/20   [name], MD  Multiple Vitamins-Minerals (CENTRUM SILVER 50+MEN) TABS Take 1 tablet by mouth daily with breakfast.    [provider]    Physical Exam: Vitals:   04/23/21 1600 04/23/21 1720 04/23/21 1800 04/23/21 1900  BP: (!) 141/84 (!) 138/107 (!) 150/80 (!) 150/85  Pulse: (!) 108 (!) 105 (!) 106 (!) 106  Resp: 18 18 18 18   Temp:      TempSrc:      SpO2: 99% 99% 99% 98%    Constitutional: In no major distress if no movement; patient appears chronically ill in appearance, disheveled and afebrile. Vitals:   04/23/21 1600 04/23/21 1720 04/23/21 1800 04/23/21 1900  BP: (!) 141/84 (!) 138/107 [name])  150/80 (!) 150/85  Pulse: (!) 108 (!) 105 (!) 106 (!) 106  Resp: 18 18 18 18   Temp:      TempSrc:      SpO2: 99% 99% 99% 98%   Eyes: PERRL, lids and conjunctivae normal, no icterus, no nystagmus. ENMT: Mucous membranes are dry on examination. Posterior pharynx clear of any exudate or lesions. Fair dentition.  Neck: normal, supple, no masses, no thyromegaly, no JVD. Respiratory: clear to auscultation bilaterally, no wheezing, no  crackles. Normal respiratory effort. No accessory muscle use.  Cardiovascular: Regular rate and rhythm, no murmurs / rubs / gallops. No carotid bruits.  Abdomen: no tenderness, no masses palpated. No hepatosplenomegaly. Bowel sounds positive.  Musculoskeletal: Tenderness to palpation in his lower back; lower extremity weakness appreciated. Skin: No petechiae.  Positive onychomycosis appreciated on exam. Neurologic: CN 2-12 grossly intact. Sensation intact, DTR normal.  Generalized weakness. Psychiatric: Normal judgment and insight. Alert and oriented x 3. Normal mood.    Labs on Admission: I have personally reviewed following labs and imaging studies  CBC: Recent Labs  Lab 04/23/21 1619  WBC 10.6*  NEUTROABS 7.8*  HGB 15.8  HCT 47.3  MCV 89.6  PLT 607    Basic Metabolic Panel: Recent Labs  Lab 04/23/21 1619  NA 136  K 3.7  CL 102  CO2 25  GLUCOSE 90  BUN 19  CREATININE 0.57*  CALCIUM 9.1    GFR: CrCl cannot be calculated (Unknown ideal weight.).   Urine analysis:    Component Value Date/Time   COLORURINE YELLOW 05/09/2017 2016   APPEARANCEUR CLEAR 05/09/2017 2016   LABSPEC 1.023 05/09/2017 2016   PHURINE 5.0 05/09/2017 2016   GLUCOSEU NEGATIVE 05/09/2017 2016   HGBUR MODERATE (A) 05/09/2017 2016   BILIRUBINUR NEGATIVE 05/09/2017 2016   KETONESUR 20 (A) 05/09/2017 2016   PROTEINUR 30 (A) 05/09/2017 2016   NITRITE NEGATIVE 05/09/2017 2016   LEUKOCYTESUR NEGATIVE 05/09/2017 2016    Radiological Exams on Admission: DG Lumbar Spine Complete  Result Date: 04/23/2021 CLINICAL DATA:  Back pain. EXAM: LUMBAR SPINE - COMPLETE 4+ VIEW COMPARISON:  None. FINDINGS: There is dextroconvex curvature of the lumbar spine. There is no evidence for acute fracture or dislocation of the lumbar spine. There is questionable mild compression deformity of T12, age indeterminate. Mild degenerative endplate osteophytes are seen throughout the lumbar spine. Disc spaces are grossly  maintained. Soft tissues are within normal limits. IMPRESSION: 1. Questionable mild compression deformity of T12, age indeterminate. Correlate for point tenderness. Consider dedicated imaging of this level. 2. Levoconvex curvature of the lumbar spine. 3. Mild degenerative changes. Electronically Signed   By: Ronney Asters M.D.   On: 04/23/2021 16:55   CT Thoracic Spine Wo Contrast  Result Date: 04/23/2021 CLINICAL DATA:  Worsening chronic back pain. EXAM: CT THORACIC AND LUMBAR SPINE WITHOUT CONTRAST TECHNIQUE: Multidetector CT imaging of the thoracic and lumbar spine was performed without contrast. Multiplanar CT image reconstructions were also generated. COMPARISON:  Lumbar spine radiographs 04/23/2021 FINDINGS: CT THORACIC SPINE FINDINGS Alignment: Exaggerated upper thoracic kyphosis.  No listhesis. Vertebrae: Permeative lytic destructive lesion of the vertebral body and left-sided posterior elements at T12 with associated pathologic fracture and mild vertebral body height loss. 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 2 cm lytic lesion in the left lateral aspect of the T9 vertebral body with extraosseous tumor extension. Paraspinal and other soft tissues: Lower thoracic paravertebral soft tissue suggestive of tumor, possibly with superimposed hematoma,  extending from T11-L1. Centrilobular and paraseptal emphysema. Disc levels: Mild-to-moderate thoracic spondylosis. At least mild spinal stenosis is suspected at T12 due to epidural tumor. Tumor also encroaches on the left-sided neural foramina at T11-12 and T12-L1. CT LUMBAR SPINE FINDINGS Segmentation: 5 lumbar type vertebrae. Alignment: Trace retrolisthesis of L4 on L5 and L5 on S1. Slight right convex curvature of the upper lumbar spine. Vertebrae: No acute fracture or suspicious osseous lesion. Paraspinal and other soft tissues: Mild abdominal aortic atherosclerosis. Disc levels: Mild disc degeneration,  greatest at L4-5 where disc bulging, spurring, and mild facet hypertrophy result in borderline spinal stenosis and mild right lateral recess and right neural foraminal stenosis. IMPRESSION: 1. Destructive lesion involving the T12 vertebral body and left-sided posterior elements with pathologic vertebral body fracture and epidural tumor resulting in at least mild spinal stenosis. Smaller lesion in the T9 vertebral body. Findings are most concerning for metastatic disease. 2. Aortic Atherosclerosis (ICD10-I70.0) and Emphysema (ICD10-J43.9). Electronically Signed   By: Logan Bores M.D.   On: 04/23/2021 18:41   CT Lumbar Spine Wo Contrast  Result Date: 04/23/2021 CLINICAL DATA:  Worsening chronic back pain. EXAM: CT THORACIC AND LUMBAR SPINE WITHOUT CONTRAST TECHNIQUE: Multidetector CT imaging of the thoracic and lumbar spine was performed without contrast. Multiplanar CT image reconstructions were also generated. COMPARISON:  Lumbar spine radiographs 04/23/2021 FINDINGS: CT THORACIC SPINE FINDINGS Alignment: Exaggerated upper thoracic kyphosis.  No listhesis. Vertebrae: Permeative lytic destructive lesion of the vertebral body and left-sided posterior elements at T12 with associated pathologic fracture and mild vertebral body height loss. 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 2 cm lytic lesion in the left lateral aspect of the T9 vertebral body with extraosseous tumor extension. Paraspinal and other soft tissues: Lower thoracic paravertebral soft tissue suggestive of tumor, possibly with superimposed hematoma, extending from T11-L1. Centrilobular and paraseptal emphysema. Disc levels: Mild-to-moderate thoracic spondylosis. At least mild spinal stenosis is suspected at T12 due to epidural tumor. Tumor also encroaches on the left-sided neural foramina at T11-12 and T12-L1. CT LUMBAR SPINE FINDINGS Segmentation: 5 lumbar type vertebrae. Alignment: Trace retrolisthesis  of L4 on L5 and L5 on S1. Slight right convex curvature of the upper lumbar spine. Vertebrae: No acute fracture or suspicious osseous lesion. Paraspinal and other soft tissues: Mild abdominal aortic atherosclerosis. Disc levels: Mild disc degeneration, greatest at L4-5 where disc bulging, spurring, and mild facet hypertrophy result in borderline spinal stenosis and mild right lateral recess and right neural foraminal stenosis. IMPRESSION: 1. Destructive lesion involving the T12 vertebral body and left-sided posterior elements with pathologic vertebral body fracture and epidural tumor resulting in at least mild spinal stenosis. Smaller lesion in the T9 vertebral body. Findings are most concerning for metastatic disease. 2. Aortic Atherosclerosis (ICD10-I70.0) and Emphysema (ICD10-J43.9). Electronically Signed   By: Logan Bores M.D.   On: 04/23/2021 18:41    EKG: none   Assessment/Plan 1-Intractable back pain -With destructive lesion involving T12 with epidural tumor resulting in mild spinal stenosis and a smaller lesion appreciated at T9 concerning for metastatic disease. -Case discussed with neurosurgery who recommended MRI evaluation and also with oncology service who recommended initiation of steroids therapy and to check CT chest abdomen and pelvis. -Oncology would like to be reconsulted in a.m. in order for then to see patient. -Follow images results and response to the steroids initiation. -Continue adjusting analgesic management. -Overall prognosis is guarded. -Palliative care has been consulted -Continue adjusting analgesics therapy as needed.  2-depression -Continue clomipramine treatment (patient reported using 100 mg on daily basis). -Currently without suicidal ideation or hallucinations.  3-tobacco abuse -Cessation counseling has been ordered -Nicotine patch has been ordered.  4-physical deconditioning and current unsafe discharge -PT/OT and TOC has been involved in patient's  case.  5-GI prophylaxis -Started on PPI  6-alcohol abuse -Cessation counseling has been provided -Patient reports no prior history of withdrawal symptoms (though had never been more than 3 days without drinking) -CIWA protocol in place, thiamine and folic acid has been ordered.  7-moderate protein calorie malnutrition -Continue the use of Ensure twice a day.  DVT prophylaxis: Lovenox Code Status:   Full code Family Communication:  No family at bedside Disposition Plan:   Patient is from:  Home  Anticipated DC to:  To be determined  Anticipated DC date:  To be determined  Anticipated DC barriers: Safe discharge pathway and completion work-up for newly diagnosed metastatic lesions to spine.  Consults called:  Neurosurgery and oncology (Dr. Payton Mccallum; he ask to reconsult oncology service in the morning for patient to be seen). Admission status:  Inpatient, length of stay more than 2 midnights; MedSurg bed.  Severity of Illness: The appropriate patient status for this patient is INPATIENT. Inpatient status is judged to be reasonable and necessary in order to provide the required intensity of service to ensure the patient's safety. The patient's presenting symptoms, physical exam findings, and initial radiographic and laboratory data in the context of their chronic comorbidities is felt to place them at high risk for further clinical deterioration. Furthermore, it is not anticipated that the patient will be medically stable for discharge from the hospital within 2 midnights of admission.   * I certify that at the point of admission it is my clinical judgment that the patient will require inpatient hospital care spanning beyond 2 midnights from the point of admission due to high intensity of service, high risk for further deterioration and high frequency of surveillance required.Barton Dubois MD [facility]  How to contact the [facility] Attending or Consulting provider [place] or  covering provider during after hours [place], for this patient?   Check the care team in [facility] and look for a) attending/consulting [facility] provider listed and b) the [facility] team listed Log into [email or web address] and use [place]'s universal password to access. If you do not have the password, please contact the hospital operator. Locate the [facility] provider you are looking for under [facility] and page to a number that you can be directly reached. If you still have difficulty reaching the provider, please page the [facility] (Director on Call) for the Hospitalists listed on amion for assistance.  04/23/2021, 8:48 PM

## 2021-04-23 NOTE — ED Provider Notes (Signed)
Care assumed from previous provider at shift change.  See note for full HPI  In summation 65 year old here for evaluation of back pain.  Seems like this is been longstanding however worsening last few weeks, subsequently due to pain and generalized weakness he is ambulating at home.  Patient appears disheveled.  Denies any bowel or bladder incontinence, saddle paresthesia.  Left extremities off bed without difficulty. Has not been taking care of self at home.  Plan on follow-up on imaging  Does not actively follow with PCP per patient Physical Exam  BP (!) 150/85    Pulse (!) 106    Temp 98.2 F (36.8 C) (Oral)    Resp 18    SpO2 98%   Physical Exam Vitals and nursing note reviewed.  Constitutional:      General: He is not in acute distress.    Appearance: He is well-developed. He is not toxic-appearing or diaphoretic.     Comments: Disheveled  HENT:     Head: Normocephalic and atraumatic.     Nose: Nose normal.     Mouth/Throat:     Mouth: Mucous membranes are dry.  Eyes:     Pupils: Pupils are equal, round, and reactive to light.  Cardiovascular:     Rate and Rhythm: Regular rhythm. Tachycardia present.  Pulmonary:     Effort: Pulmonary effort is normal. No respiratory distress.     Breath sounds: Normal breath sounds.  Abdominal:     General: Bowel sounds are normal. There is no distension.     Palpations: Abdomen is soft.  Genitourinary:    Comments: Rectal tone present Musculoskeletal:        General: Normal range of motion.     Cervical back: Normal range of motion and neck supple.     Comments: Mild midline lower thoracic, lumbar tenderness. Lift all 4 extremities off bed. Compartments soft  Skin:    General: Skin is warm and dry.  Neurological:     General: No focal deficit present.     Mental Status: He is alert and oriented to person, place, and time.     Sensory: Sensation is intact.     Motor: Weakness present.     Comments: Global weakness   ED  Course/Procedures   Clinical Course as of 04/23/21 2013  Sat Apr 23, 2021  1751 Chronic back pain worsening for months. Does not take meds at home. Uses cane at baseline. Low suspicion for cauda equina. FU on CT. If imaging comes back if ambulates can dc home. If unable to ambulate plan to PT/OT/ placement. [BH]    Clinical Course User Index [BH] Savien Mamula A, PA-C    Procedures DG Lumbar Spine Complete  Result Date: 04/23/2021 CLINICAL DATA:  Back pain. EXAM: LUMBAR SPINE - COMPLETE 4+ VIEW COMPARISON:  None. FINDINGS: There is dextroconvex curvature of the lumbar spine. There is no evidence for acute fracture or dislocation of the lumbar spine. There is questionable mild compression deformity of T12, age indeterminate. Mild degenerative endplate osteophytes are seen throughout the lumbar spine. Disc spaces are grossly maintained. Soft tissues are within normal limits. IMPRESSION: 1. Questionable mild compression deformity of T12, age indeterminate. Correlate for point tenderness. Consider dedicated imaging of this level. 2. Levoconvex curvature of the lumbar spine. 3. Mild degenerative changes. Electronically Signed   By: Ronney Asters M.D.   On: 04/23/2021 16:55   CT Thoracic Spine Wo Contrast  Result Date: 04/23/2021 CLINICAL DATA:  Worsening chronic back  pain. EXAM: CT THORACIC AND LUMBAR SPINE WITHOUT CONTRAST TECHNIQUE: Multidetector CT imaging of the thoracic and lumbar spine was performed without contrast. Multiplanar CT image reconstructions were also generated. COMPARISON:  Lumbar spine radiographs 04/23/2021 FINDINGS: CT THORACIC SPINE FINDINGS Alignment: Exaggerated upper thoracic kyphosis.  No listhesis. Vertebrae: Permeative lytic destructive lesion of the vertebral body and left-sided posterior elements at T12 with associated pathologic fracture and mild vertebral body height loss. 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 2 cm  lytic lesion in the left lateral aspect of the T9 vertebral body with extraosseous tumor extension. Paraspinal and other soft tissues: Lower thoracic paravertebral soft tissue suggestive of tumor, possibly with superimposed hematoma, extending from T11-L1. Centrilobular and paraseptal emphysema. Disc levels: Mild-to-moderate thoracic spondylosis. At least mild spinal stenosis is suspected at T12 due to epidural tumor. Tumor also encroaches on the left-sided neural foramina at T11-12 and T12-L1. CT LUMBAR SPINE FINDINGS Segmentation: 5 lumbar type vertebrae. Alignment: Trace retrolisthesis of L4 on L5 and L5 on S1. Slight right convex curvature of the upper lumbar spine. Vertebrae: No acute fracture or suspicious osseous lesion. Paraspinal and other soft tissues: Mild abdominal aortic atherosclerosis. Disc levels: Mild disc degeneration, greatest at L4-5 where disc bulging, spurring, and mild facet hypertrophy result in borderline spinal stenosis and mild right lateral recess and right neural foraminal stenosis. IMPRESSION: 1. Destructive lesion involving the T12 vertebral body and left-sided posterior elements with pathologic vertebral body fracture and epidural tumor resulting in at least mild spinal stenosis. Smaller lesion in the T9 vertebral body. Findings are most concerning for metastatic disease. 2. Aortic Atherosclerosis (ICD10-I70.0) and Emphysema (ICD10-J43.9). Electronically Signed   By: Logan Bores M.D.   On: 04/23/2021 18:41   CT Lumbar Spine Wo Contrast  Result Date: 04/23/2021 CLINICAL DATA:  Worsening chronic back pain. EXAM: CT THORACIC AND LUMBAR SPINE WITHOUT CONTRAST TECHNIQUE: Multidetector CT imaging of the thoracic and lumbar spine was performed without contrast. Multiplanar CT image reconstructions were also generated. COMPARISON:  Lumbar spine radiographs 04/23/2021 FINDINGS: CT THORACIC SPINE FINDINGS Alignment: Exaggerated upper thoracic kyphosis.  No listhesis. Vertebrae: Permeative  lytic destructive lesion of the vertebral body and left-sided posterior elements at T12 with associated pathologic fracture and mild vertebral body height loss. 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 2 cm lytic lesion in the left lateral aspect of the T9 vertebral body with extraosseous tumor extension. Paraspinal and other soft tissues: Lower thoracic paravertebral soft tissue suggestive of tumor, possibly with superimposed hematoma, extending from T11-L1. Centrilobular and paraseptal emphysema. Disc levels: Mild-to-moderate thoracic spondylosis. At least mild spinal stenosis is suspected at T12 due to epidural tumor. Tumor also encroaches on the left-sided neural foramina at T11-12 and T12-L1. CT LUMBAR SPINE FINDINGS Segmentation: 5 lumbar type vertebrae. Alignment: Trace retrolisthesis of L4 on L5 and L5 on S1. Slight right convex curvature of the upper lumbar spine. Vertebrae: No acute fracture or suspicious osseous lesion. Paraspinal and other soft tissues: Mild abdominal aortic atherosclerosis. Disc levels: Mild disc degeneration, greatest at L4-5 where disc bulging, spurring, and mild facet hypertrophy result in borderline spinal stenosis and mild right lateral recess and right neural foraminal stenosis. IMPRESSION: 1. Destructive lesion involving the T12 vertebral body and left-sided posterior elements with pathologic vertebral body fracture and epidural tumor resulting in at least mild spinal stenosis. Smaller lesion in the T9 vertebral body. Findings are most concerning for metastatic disease. 2. Aortic Atherosclerosis (ICD10-I70.0) and Emphysema (  ICD10-J43.9). Electronically Signed   By: Logan Bores M.D.   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  Smaller lesion at T9, concerning for metastatic disease  Given patient's generalized weakness pain, lack of PCP follow-up will contact, oncology, suspect will need admission due to weakness  CONSULT with Dr. Lindi Adie with Oncology. Rec 12 mg Decadron, CT C/A/P, Neurosurgery consult and Medicine admit. Rec Oncology be re-consulted tomorrow morning.  CONSULT with Viona Gilmore, NP with [facility] who rec MR W/WO T/L spine. CT C/A/P. No need for surgical intervention currently. Can re-consult if needed.  CONSULT with Dr. Dyann Kief with [facility] who agrees to evaluate patient for admission  Discussed plan with admission to hospital with patient.  He is agreeable.  Pain currently controlled.  The patient appears reasonably stabilized for admission considering the current resources, flow, and capabilities available in the ED at this time, and I doubt any other [facility] requiring further screening and/or treatment in the ED prior to admission.        Nettie Elm, PA-C 04/23/21 2103    Valarie Merino, MD 04/26/21 1037

## 2021-04-24 ENCOUNTER — Inpatient Hospital Stay (HOSPITAL_COMMUNITY): Payer: Medicare Other

## 2021-04-24 ENCOUNTER — Encounter (HOSPITAL_COMMUNITY): Payer: Self-pay | Admitting: Internal Medicine

## 2021-04-24 DIAGNOSIS — D499 Neoplasm of unspecified behavior of unspecified site: Secondary | ICD-10-CM | POA: Diagnosis not present

## 2021-04-24 DIAGNOSIS — Z7189 Other specified counseling: Secondary | ICD-10-CM

## 2021-04-24 DIAGNOSIS — C801 Malignant (primary) neoplasm, unspecified: Secondary | ICD-10-CM

## 2021-04-24 DIAGNOSIS — R531 Weakness: Secondary | ICD-10-CM | POA: Diagnosis not present

## 2021-04-24 DIAGNOSIS — M546 Pain in thoracic spine: Secondary | ICD-10-CM

## 2021-04-24 DIAGNOSIS — R918 Other nonspecific abnormal finding of lung field: Secondary | ICD-10-CM | POA: Diagnosis not present

## 2021-04-24 DIAGNOSIS — F1090 Alcohol use, unspecified, uncomplicated: Secondary | ICD-10-CM

## 2021-04-24 DIAGNOSIS — E279 Disorder of adrenal gland, unspecified: Secondary | ICD-10-CM

## 2021-04-24 DIAGNOSIS — M899 Disorder of bone, unspecified: Secondary | ICD-10-CM

## 2021-04-24 DIAGNOSIS — F32A Depression, unspecified: Secondary | ICD-10-CM

## 2021-04-24 DIAGNOSIS — M549 Dorsalgia, unspecified: Secondary | ICD-10-CM | POA: Diagnosis not present

## 2021-04-24 DIAGNOSIS — F17210 Nicotine dependence, cigarettes, uncomplicated: Secondary | ICD-10-CM

## 2021-04-24 DIAGNOSIS — Q8740 Marfan's syndrome, unspecified: Secondary | ICD-10-CM

## 2021-04-24 DIAGNOSIS — G8929 Other chronic pain: Secondary | ICD-10-CM

## 2021-04-24 DIAGNOSIS — Z515 Encounter for palliative care: Secondary | ICD-10-CM

## 2021-04-24 HISTORY — DX: Malignant (primary) neoplasm, unspecified: C80.1

## 2021-04-24 LAB — CBC
HCT: 41.7 % (ref 39.0–52.0)
Hemoglobin: 13.7 g/dL (ref 13.0–17.0)
MCH: 29.6 pg (ref 26.0–34.0)
MCHC: 32.9 g/dL (ref 30.0–36.0)
MCV: 90.1 fL (ref 80.0–100.0)
Platelets: 315 10*3/uL (ref 150–400)
RBC: 4.63 MIL/uL (ref 4.22–5.81)
RDW: 12.2 % (ref 11.5–15.5)
WBC: 4.5 10*3/uL (ref 4.0–10.5)
nRBC: 0 % (ref 0.0–0.2)

## 2021-04-24 LAB — COMPREHENSIVE METABOLIC PANEL
ALT: 15 U/L (ref 0–44)
AST: 13 U/L — ABNORMAL LOW (ref 15–41)
Albumin: 3.1 g/dL — ABNORMAL LOW (ref 3.5–5.0)
Alkaline Phosphatase: 68 U/L (ref 38–126)
Anion gap: 7 (ref 5–15)
BUN: 17 mg/dL (ref 8–23)
CO2: 23 mmol/L (ref 22–32)
Calcium: 9 mg/dL (ref 8.9–10.3)
Chloride: 106 mmol/L (ref 98–111)
Creatinine, Ser: 0.48 mg/dL — ABNORMAL LOW (ref 0.61–1.24)
GFR, Estimated: >60 mL/min (ref >60)
Glucose, Bld: 179 mg/dL — ABNORMAL HIGH (ref 70–99)
Potassium: 3.9 mmol/L (ref 3.5–5.1)
Sodium: 136 mmol/L (ref 135–145)
Total Bilirubin: 0.7 mg/dL (ref 0.3–1.2)
Total Protein: 6.5 g/dL (ref 6.5–8.1)

## 2021-04-24 LAB — PSA: Prostatic Specific Antigen: 1.78 ng/mL (ref 0.00–4.00)

## 2021-04-24 LAB — HIV ANTIBODY (ROUTINE TESTING W REFLEX): HIV Screen 4th Generation wRfx: NONREACTIVE

## 2021-04-24 IMAGING — MR MR LUMBAR SPINE WO/W CM
7 of 11 series · 29 of 48 positions shown · IV contrast (gadavist)
Comparison: CT thoracic and lumbar spine [DATE]

CLINICAL DATA: Back pain.  Metastatic disease.

EXAM:
MRI THORACIC AND LUMBAR SPINE WITHOUT AND WITH CONTRAST
TECHNIQUE: Multiplanar and multiecho pulse sequences of the thoracic and lumbar
spine were obtained without and with intravenous contrast.
CONTRAST:  7mL GADAVIST GADOBUTROL 1 MMOL/ML IV SOLN

[Series 17: T1 · sagittal · 4.0mm · 0.81mm/px · 3 of 17 slices shown (1 of 3)]
[im 1/17]
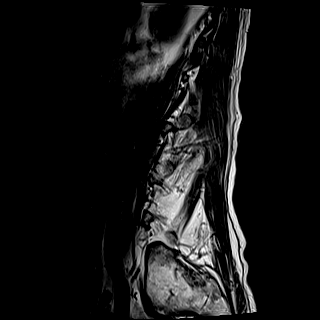
[im 9/17]
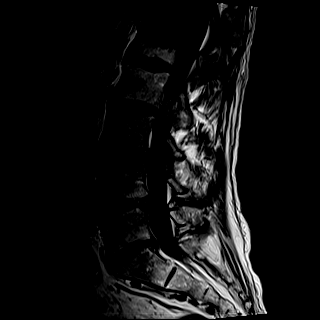
[im 17/17]
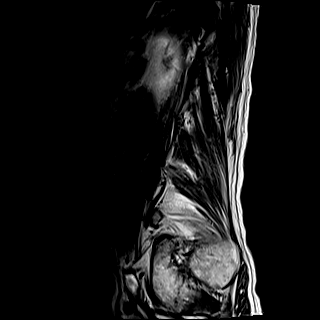

[Series 18: T2 · sagittal · 4.0mm · 0.81mm/px · 3 of 17 slices shown (1 of 3)]
[im 1/17]
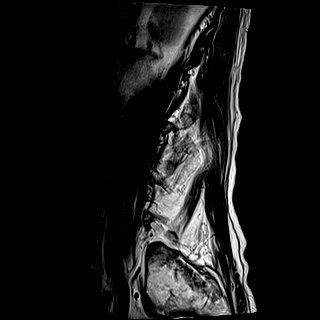
[im 9/17]
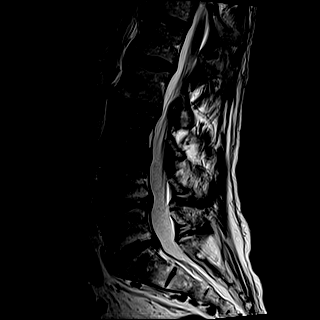
[im 17/17]
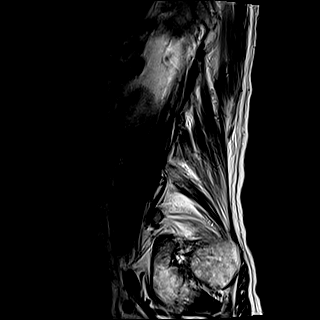

[Series 20: T2 · axial · 4.0mm · 0.62mm/px · z∈[-639,-511]mm · 5 of 28 slices shown (2 of 3)]
[im 1/28]
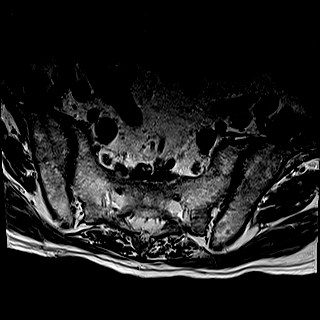
[im 7/28]
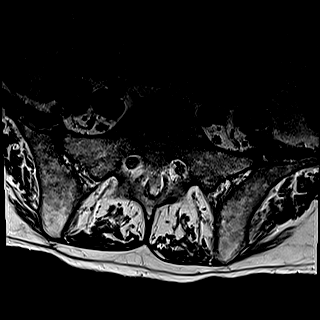
[im 14/28]
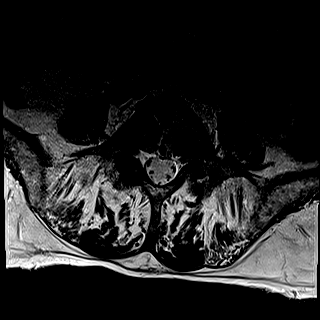
[im 21/28]
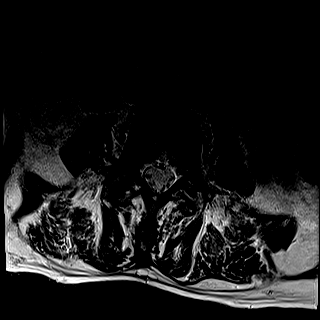
[im 28/28]
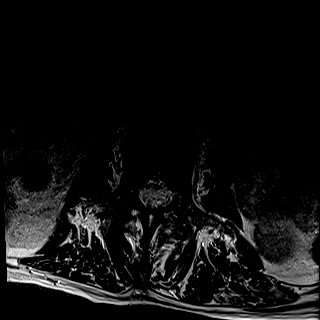

[Series 21: T2 · axial · 4.0mm · 0.62mm/px · z∈[-512,-358]mm · 6 of 32 slices shown (3 of 3)]
[im 1/32]
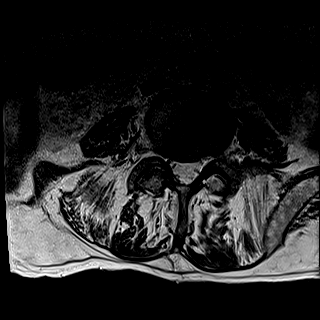
[im 7/32]
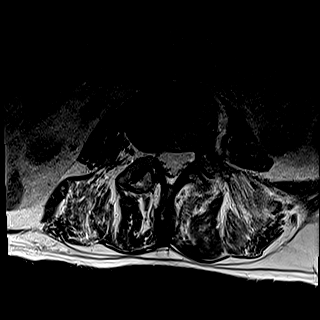
[im 13/32]
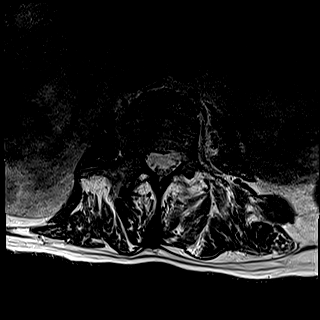
[im 19/32]
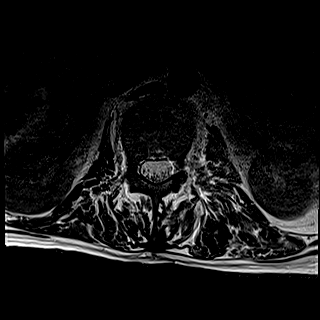
[im 25/32]
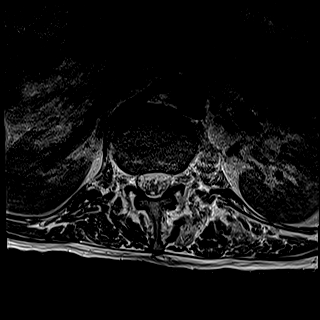
[im 32/32]
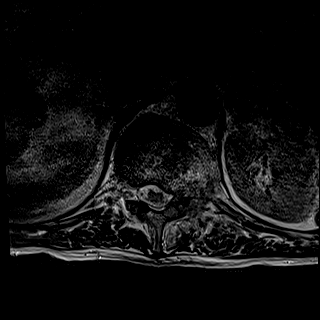

[Series 22: T1 · axial · 4.0mm · 0.39mm/px · z∈[-507,-354]mm · 6 of 32 slices shown (2 of 3)]
[im 1/32]
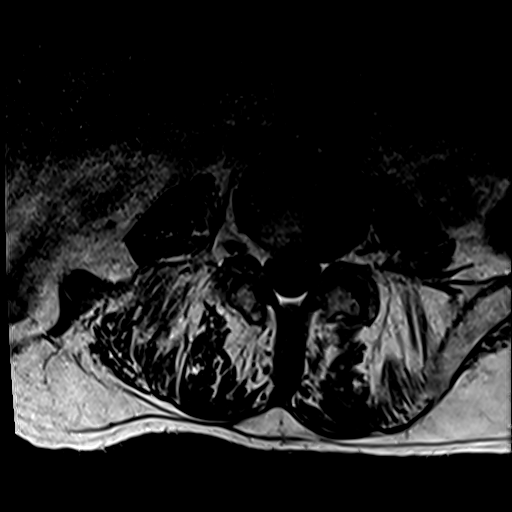
[im 7/32]
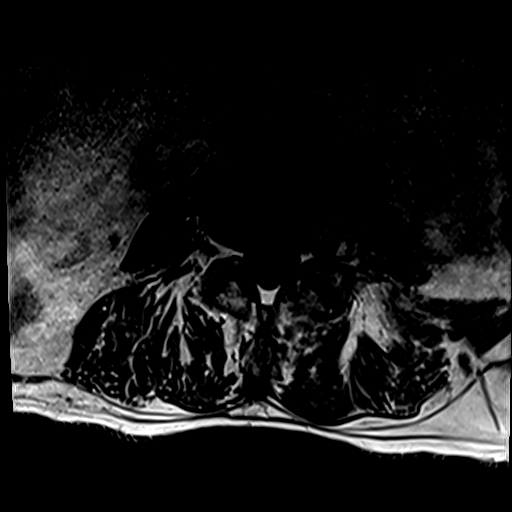
[im 13/32]
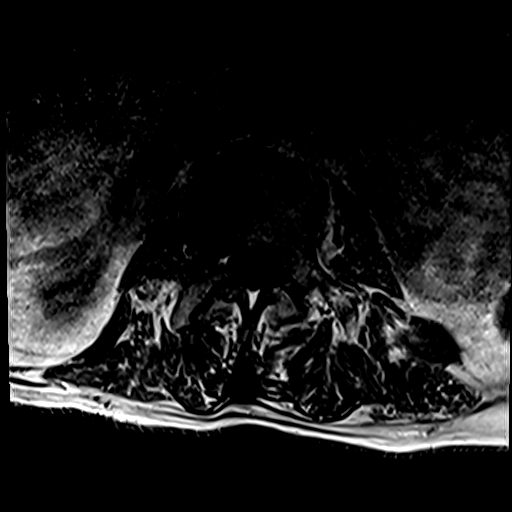
[im 19/32]
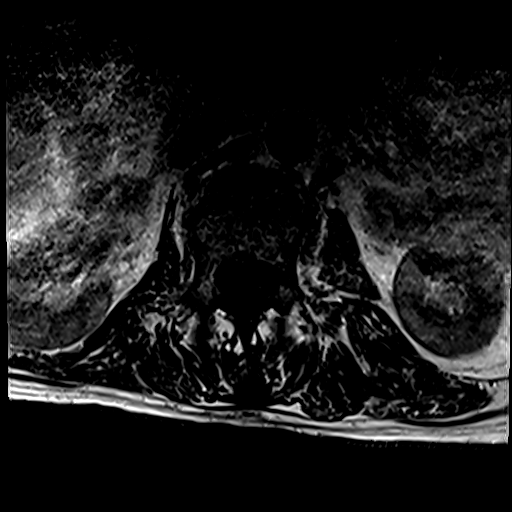
[im 25/32]
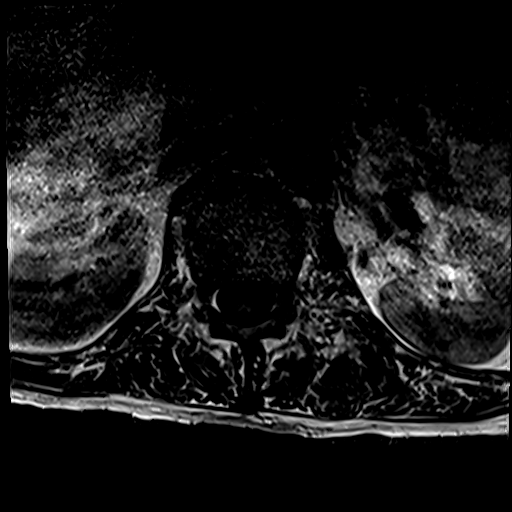
[im 32/32]
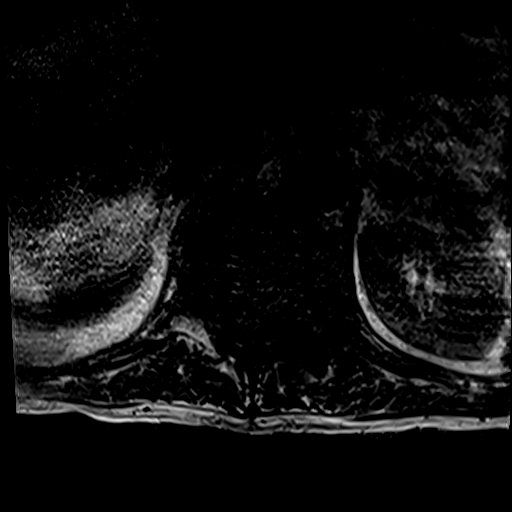

[Series 23: T1 · axial · 4.0mm · 0.39mm/px · z∈[-639,-577]mm · 3 of 28 slices shown (3 of 3)]
[im 1/28]
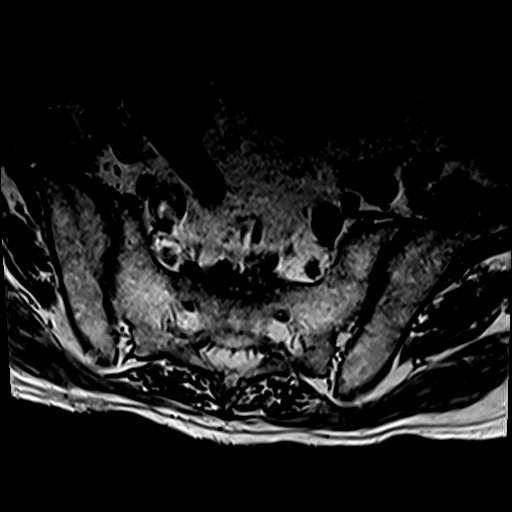
[im 7/28]
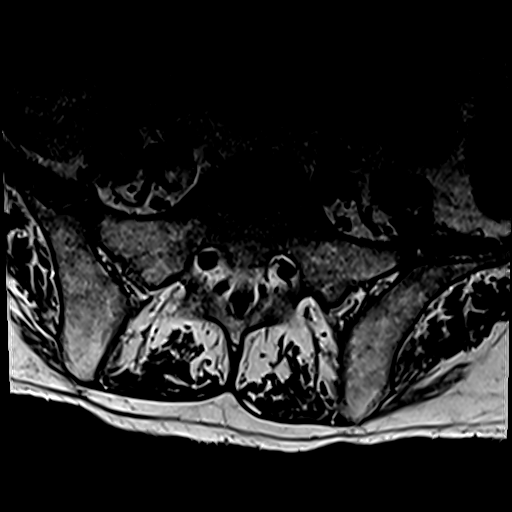
[im 14/28]
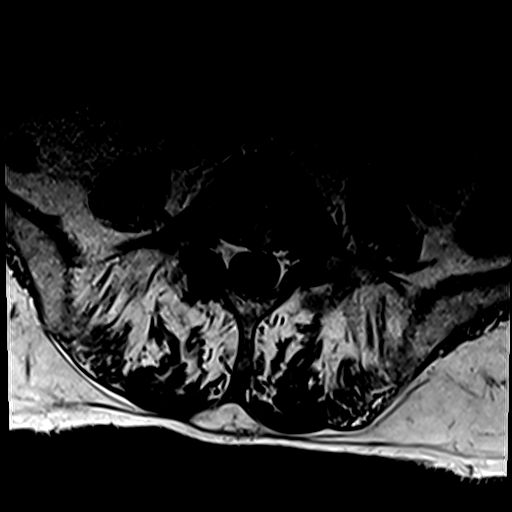

[Series 24: T2 post-contrast · sagittal · 4.0mm · 1.02mm/px · 3 of 17 slices shown]
[im 1/17]
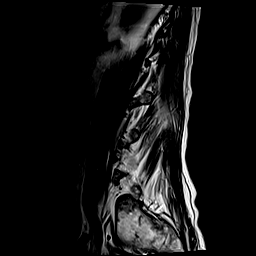
[im 9/17]
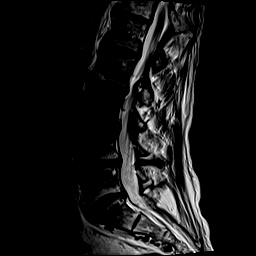
[im 17/17]
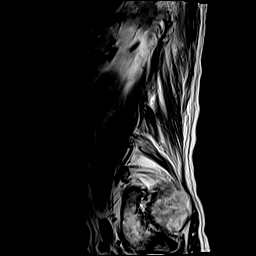

[29 of 48 positions shown; findings below may reference images not displayed]

FINDINGS: MRI THORACIC SPINE FINDINGS

Alignment: Exaggerated upper thoracic kyphosis. No significant
listhesis.

Vertebrae: Abnormal T1 hypointensity, T2/STIR hyperintensity, and
enhancement throughout the T9 vertebral body including a more focal
2 cm lesion in the left aspect of the vertebral body corresponding
to the lytic lesion on CT. 2 cm lesion in the posterior aspect of
the T5 vertebral body. Subcentimeter enhancing lesion in the right
T11 pars. 1 cm enhancing lesion in the base of the T10 spinous
process. Subcentimeter enhancing lesion versus vascular enhancement
in the left T10 pedicle. Destructive lesion involving the T12
vertebral body and left-sided posterior elements with pathologic
fracture as described on CT with extraosseous tumor encroaching upon
the left T11-12 and T12-L1 neural foramina and with ventral and
left-sided epidural tumor resulting in mild spinal stenosis without
cord compression. Mild edema and enhancement along the endplates at
T2-3, likely degenerative.

Cord:  Normal cord signal.

Paraspinal and other soft tissues: Paraspinal tumor at T12.

Disc levels:

Mild spinal stenosis at T12 due to epidural tumor. Mild thoracic
spondylosis elsewhere without significant degenerative stenosis.

MRI LUMBAR SPINE FINDINGS

Segmentation:  Standard.

Alignment: Slight right convex curvature of the lumbar spine. Trace
retrolisthesis of L4 on L5 and L5 on S1.

Vertebrae: Enhancing lesions in the L1 vertebral body and spinous
process without evidence of associated epidural tumor subcentimeter
enhancing lesion in the right L5 pedicle. No fracture.

Conus medullaris: Extends to the L1-2 level and appears normal.

Paraspinal and other soft tissues: Paraspinal tumor at T12 as
described above extending to the L1 level.

Disc levels:

L1-2: Disc desiccation. Mild disc bulging and mild facet hypertrophy
without stenosis.

L2-3: Minimal disc bulging and mild-to-moderate facet hypertrophy
without stenosis.

L3-4: Disc desiccation. Disc bulging, a left foraminal disc
osteophyte complex, and mild to moderate facet hypertrophy without
significant stenosis.

L4-5: Disc desiccation and mild-to-moderate disc space narrowing.
Disc bulging, a small central disc protrusion, and mild-to-moderate
facet and ligamentum flavum hypertrophy result in borderline to mild
bilateral lateral recess and right neural foraminal stenosis without
spinal stenosis.

L5-S1: Disc desiccation and mild-to-moderate disc space narrowing.
Disc bulging, a small left central disc protrusion, and mild facet
hypertrophy result in borderline to mild left lateral recess
stenosis without spinal or neural foraminal stenosis.
IMPRESSION: 1. Multiple enhancing thoracic and lumbar spine lesions consistent
with metastases.
2. Destructive lesion involving the T12 vertebral body and
left-sided posterior elements with pathologic fracture and epidural
tumor resulting in mild spinal stenosis.

## 2021-04-24 IMAGING — MR MR THORACIC SPINE WO/W CM
8 of 10 series · 34 of 48 positions shown · IV contrast (gadavist)
Comparison: CT thoracic and lumbar spine [DATE]

CLINICAL DATA: Back pain.  Metastatic disease.

EXAM:
MRI THORACIC AND LUMBAR SPINE WITHOUT AND WITH CONTRAST
TECHNIQUE: Multiplanar and multiecho pulse sequences of the thoracic and lumbar
spine were obtained without and with intravenous contrast.
CONTRAST:  7mL GADAVIST GADOBUTROL 1 MMOL/ML IV SOLN

[Series 16: T1 · sagittal · 4.0mm · 1.84mm/px · 1 of 9 slices shown (1 of 3)]
[im 1/9]
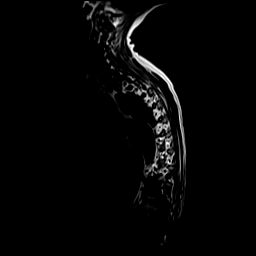

[Series 17: STIR · sagittal · 3.0mm · 1.06mm/px · 3 of 20 slices shown]
[im 1/20]
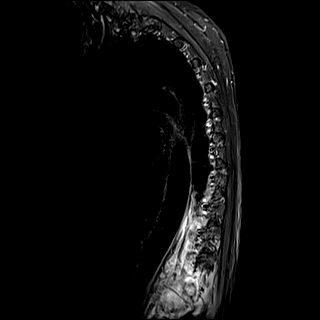
[im 10/20]
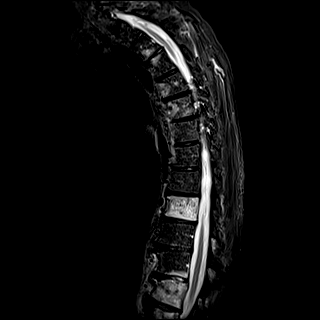
[im 20/20]
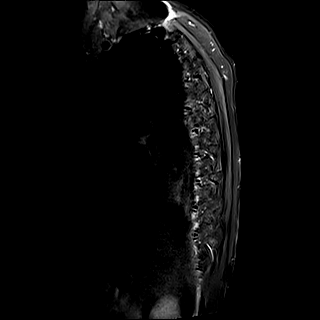

[Series 18: T1 · sagittal · 3.0mm · 1.00mm/px · 4 of 20 slices shown (2 of 3)]
[im 1/20]
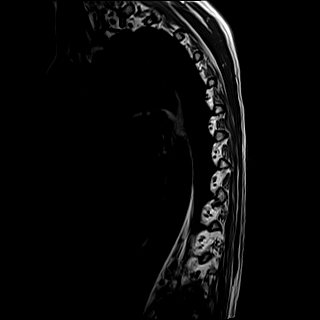
[im 7/20]
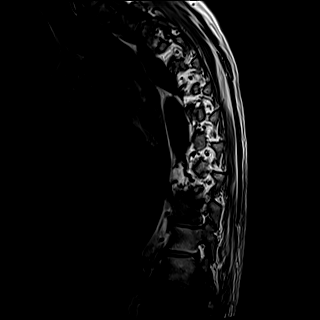
[im 13/20]
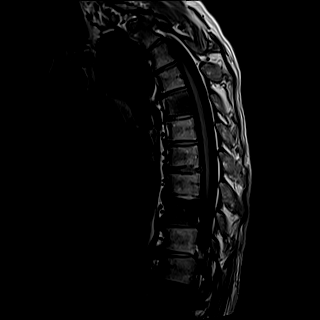
[im 20/20]
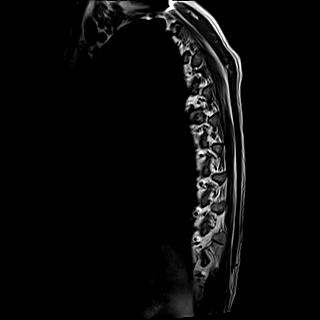

[Series 19: T2 · sagittal · 3.0mm · 1.00mm/px · 4 of 20 slices shown (1 of 2)]
[im 1/20]
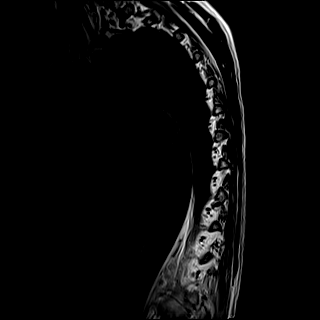
[im 7/20]
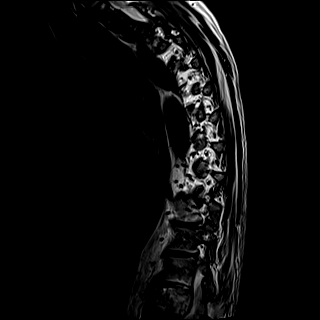
[im 13/20]
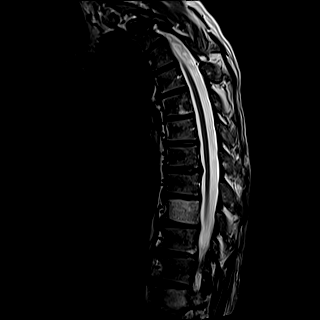
[im 20/20]
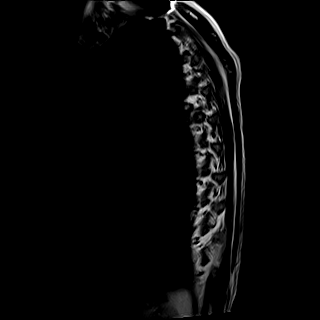

[Series 21: T2 · axial · 4.0mm · 0.78mm/px · z∈[-342,-133]mm · 7 of 39 slices shown (2 of 2)]
[im 1/39]
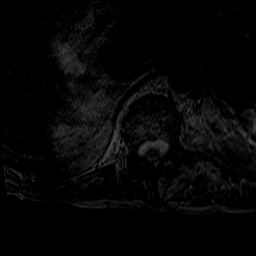
[im 7/39]
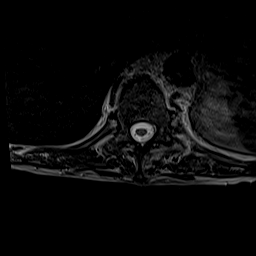
[im 13/39]
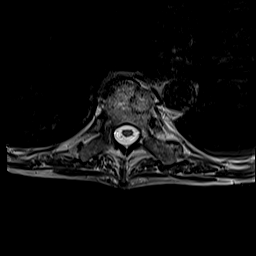
[im 20/39]
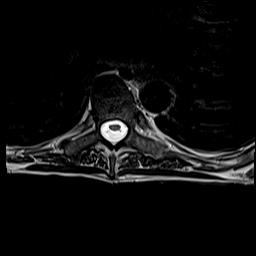
[im 26/39]
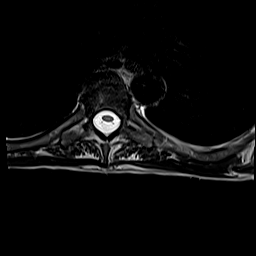
[im 32/39]
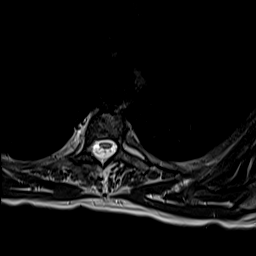
[im 39/39]
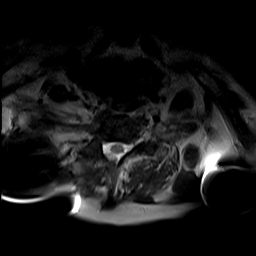

[Series 22: T1 · axial · 4.0mm · 0.39mm/px · z∈[-342,-133]mm · 7 of 39 slices shown (3 of 3)]
[im 1/39]
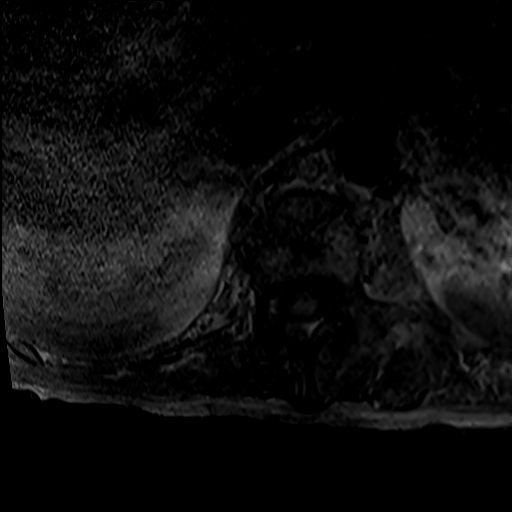
[im 7/39]
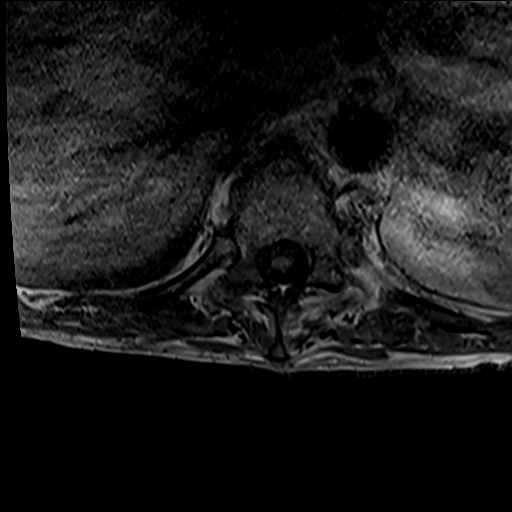
[im 13/39]
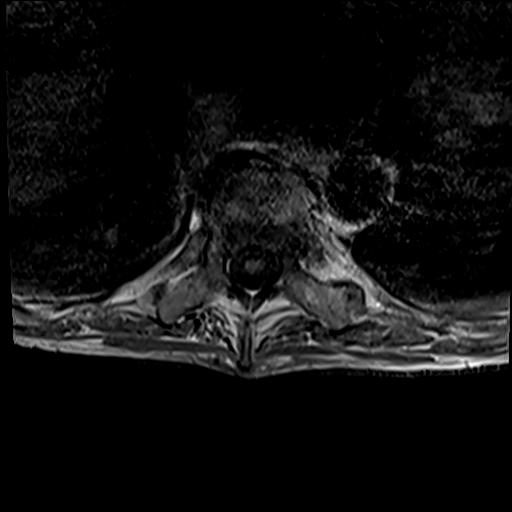
[im 20/39]
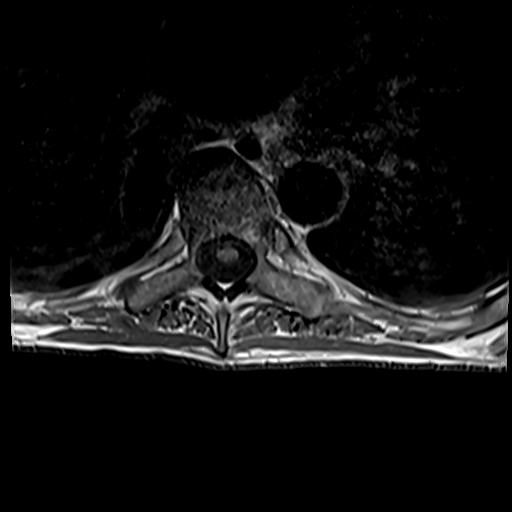
[im 26/39]
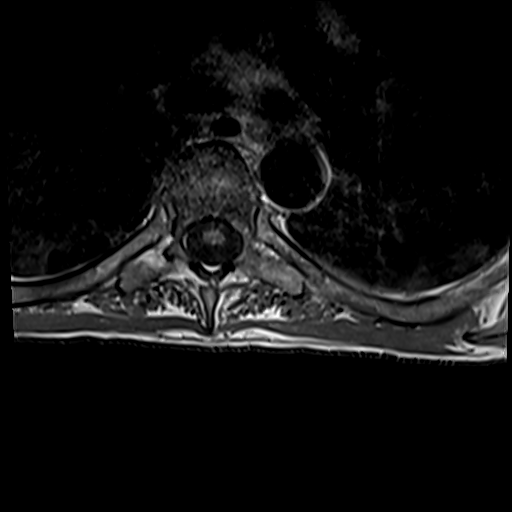
[im 32/39]
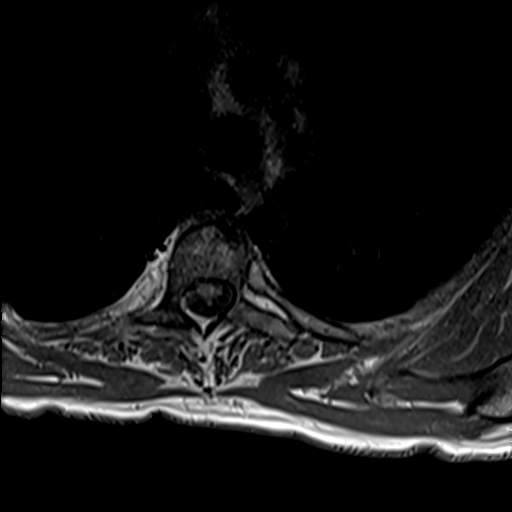
[im 39/39]
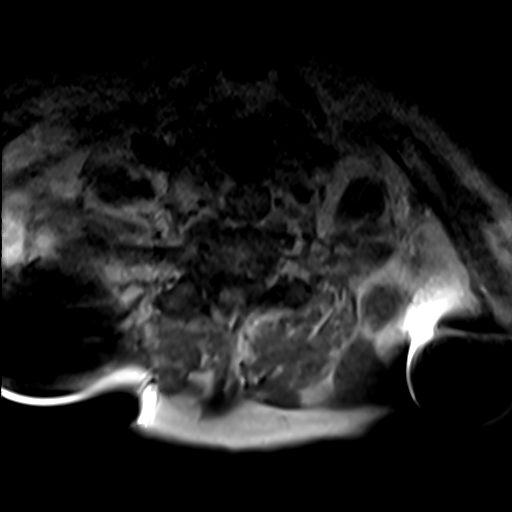

[Series 23: T2 post-contrast · sagittal · 3.0mm · 1.00mm/px · 4 of 20 slices shown]
[im 1/20]
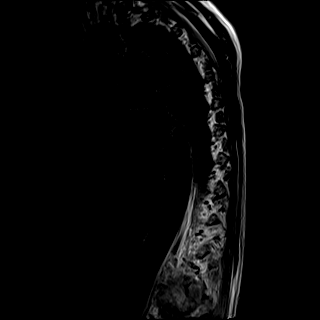
[im 7/20]
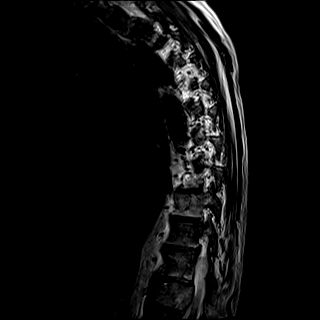
[im 13/20]
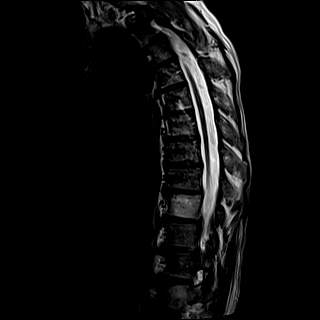
[im 20/20]
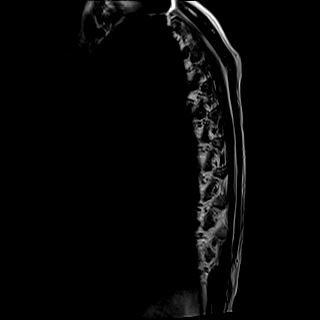

[Series 24: T1 fat-sat post-contrast · sagittal · 3.0mm · 1.25mm/px · 4 of 20 slices shown]
[im 1/20]
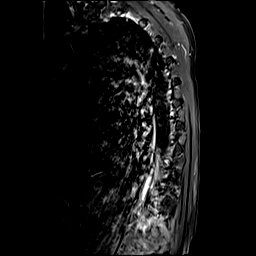
[im 7/20]
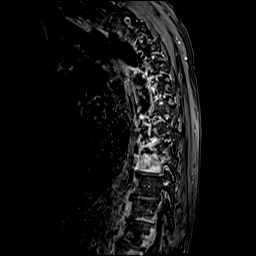
[im 13/20]
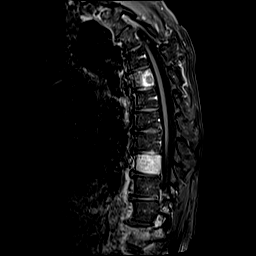
[im 20/20]
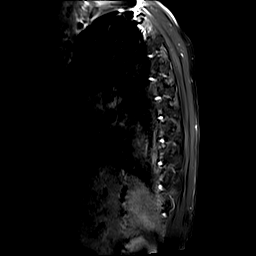

[34 of 48 positions shown; findings below may reference images not displayed]

FINDINGS: MRI THORACIC SPINE FINDINGS

Alignment: Exaggerated upper thoracic kyphosis. No significant
listhesis.

Vertebrae: Abnormal T1 hypointensity, T2/STIR hyperintensity, and
enhancement throughout the T9 vertebral body including a more focal
2 cm lesion in the left aspect of the vertebral body corresponding
to the lytic lesion on CT. 2 cm lesion in the posterior aspect of
the T5 vertebral body. Subcentimeter enhancing lesion in the right
T11 pars. 1 cm enhancing lesion in the base of the T10 spinous
process. Subcentimeter enhancing lesion versus vascular enhancement
in the left T10 pedicle. Destructive lesion involving the T12
vertebral body and left-sided posterior elements with pathologic
fracture as described on CT with extraosseous tumor encroaching upon
the left T11-12 and T12-L1 neural foramina and with ventral and
left-sided epidural tumor resulting in mild spinal stenosis without
cord compression. Mild edema and enhancement along the endplates at
T2-3, likely degenerative.

Cord:  Normal cord signal.

Paraspinal and other soft tissues: Paraspinal tumor at T12.

Disc levels:

Mild spinal stenosis at T12 due to epidural tumor. Mild thoracic
spondylosis elsewhere without significant degenerative stenosis.

MRI LUMBAR SPINE FINDINGS

Segmentation:  Standard.

Alignment: Slight right convex curvature of the lumbar spine. Trace
retrolisthesis of L4 on L5 and L5 on S1.

Vertebrae: Enhancing lesions in the L1 vertebral body and spinous
process without evidence of associated epidural tumor subcentimeter
enhancing lesion in the right L5 pedicle. No fracture.

Conus medullaris: Extends to the L1-2 level and appears normal.

Paraspinal and other soft tissues: Paraspinal tumor at T12 as
described above extending to the L1 level.

Disc levels:

L1-2: Disc desiccation. Mild disc bulging and mild facet hypertrophy
without stenosis.

L2-3: Minimal disc bulging and mild-to-moderate facet hypertrophy
without stenosis.

L3-4: Disc desiccation. Disc bulging, a left foraminal disc
osteophyte complex, and mild to moderate facet hypertrophy without
significant stenosis.

L4-5: Disc desiccation and mild-to-moderate disc space narrowing.
Disc bulging, a small central disc protrusion, and mild-to-moderate
facet and ligamentum flavum hypertrophy result in borderline to mild
bilateral lateral recess and right neural foraminal stenosis without
spinal stenosis.

L5-S1: Disc desiccation and mild-to-moderate disc space narrowing.
Disc bulging, a small left central disc protrusion, and mild facet
hypertrophy result in borderline to mild left lateral recess
stenosis without spinal or neural foraminal stenosis.
IMPRESSION: 1. Multiple enhancing thoracic and lumbar spine lesions consistent
with metastases.
2. Destructive lesion involving the T12 vertebral body and
left-sided posterior elements with pathologic fracture and epidural
tumor resulting in mild spinal stenosis.

## 2021-04-24 MED ORDER — GADOBUTROL 1 MMOL/ML IV SOLN
7.0000 mL | Freq: Once | INTRAVENOUS | Status: AC | PRN
  Administered 2021-04-24: 7 mL via INTRAVENOUS

## 2021-04-24 MED ORDER — OXYCODONE HCL 5 MG PO TABS
5.0000 mg | ORAL_TABLET | Freq: Four times a day (QID) | ORAL | Status: DC | PRN
  Administered 2021-04-24 – 2021-04-29 (×13): 5 mg via ORAL
  Filled 2021-04-24 (×14): qty 1

## 2021-04-24 MED ORDER — CLOMIPRAMINE HCL 25 MG PO CAPS
100.0000 mg | ORAL_CAPSULE | Freq: Every day | ORAL | Status: DC
  Administered 2021-04-24 – 2021-05-12 (×19): 100 mg via ORAL
  Filled 2021-04-24 (×19): qty 4

## 2021-04-24 MED ORDER — HEPARIN SODIUM (PORCINE) 5000 UNIT/ML IJ SOLN
5000.0000 [IU] | Freq: Three times a day (TID) | INTRAMUSCULAR | Status: DC
  Administered 2021-04-24 – 2021-05-13 (×51): 5000 [IU] via SUBCUTANEOUS
  Filled 2021-04-24 (×53): qty 1

## 2021-04-24 MED ORDER — HYDROMORPHONE HCL 1 MG/ML IJ SOLN
0.5000 mg | INTRAMUSCULAR | Status: DC | PRN
  Administered 2021-04-24: 0.5 mg via INTRAVENOUS
  Filled 2021-04-24: qty 0.5

## 2021-04-24 MED ORDER — ENSURE ENLIVE PO LIQD
237.0000 mL | Freq: Two times a day (BID) | ORAL | Status: DC
  Administered 2021-04-24 – 2021-04-25 (×3): 237 mL via ORAL

## 2021-04-24 NOTE — Progress Notes (Signed)
OT Cancellation Note  Patient Details Name: Christopher Dawson MRN: [id number] DOB: June 17, 1955   Cancelled Treatment:    Reason Eval/Treat Not Completed: Patient not medically ready. Waiting on further work up and imaging prior to therapy evaluation.   Tennis Mckinnon L Linden Mikes 04/24/2021, 7:00 AM

## 2021-04-24 NOTE — Consult Note (Signed)
Palliative Care Consult Note                                  Date: 04/24/2021   Patient Name: Christopher Dawson  DOB: 1955/06/11  MRN: [id number]  Age / Sex: 66 y.o., male  PCP: [name], MD Referring Physician: Antonieta Pert, MD  Reason for Consultation: Establishing goals of care  HPI/Patient Profile: Palliative Care consult requested for goals of care discussion in this 66 y.o. male  with past medical history of  chronic back pain, anxiety, depression, tobacco abuse, alcohol abuse, Marfan syndrome, and lumbar radiculopathy. He was admitted on 04/23/2021 from home with intractable back pain. CT chest/abdomen/pelvis showed T9/T12 lytic lesions, lung mass, and adrenal mass. Oncology and Neurosurgery following with pending MRI.   Past Medical History:  Diagnosis Date   Anxiety    Depression    Lumbar radiculopathy, chronic 2008   Marfan syndrome    Neurosis, depressive    Shortening, leg, congenital      Subjective:   This NP Osborne Oman reviewed medical records, received report from team, assessed the patient and then met at the patient's bedside with Christopher Dawson to discuss diagnosis, prognosis, GOC, EOL wishes disposition and options.  He is awake and alert watching television. Denies shortness of breath. Endorses intermittent back pain which is much better compared to when he came in and generalized weakness.   Concept of Palliative Care was introduced as specialized medical care for people and their families living with serious illness.  It focuses on providing relief from the symptoms and stress of a serious illness.  The goal is to improve quality of life for both the patient and the family. Values and goals of care important to patient and family were attempted to be elicited.  I created space and opportunity for patient and family to explore state of health prior to admission, thoughts, and feelings. Christopher Dawson states he  lives in the home alone. Several weeks prior he was able to ambulate independently, perform all ADLs, run errands etc. He did not have any health concerns although he does have chronic back pain. Past week he has increased back pain to the point he was unable to walk or do anything for himself. He states he has not seen his PCP in some time due to office closing. He is planning to establish care with a new provider now.   Endorses alcohol use. States he drinks 5-6 Bud Lights daily and smokes 1-1.5pks of cigarettes/day. He has been smoking since he was 66 years old.   We discussed His current illness and what it means in the larger context of His on-going co-morbidities. Natural disease trajectory and expectations were discussed.  Christopher Dawson verbalized understanding of current illness and co-morbidities. He is able to appropriately summarize findings since admission with plan of care. He is hopeful that he may be strong enough and a candidate for any recommended treatment options whether it be surgical interventions or chemoradiation. Christopher Dawson is emotional expressing hopes of some improvement/stability as he is not ready to be at "death's door!" Emotional support provided.   He understands oncology is closely following and further work-up will be required before treatment options are presented.   Currently receiving IV dexamethasone and dilaudid for pain. Feels pain is much improved. States he has not followed up with a provider regarding his anxiety and depression and will make a priority to have  follow-up given most likely new cancer diagnosis. Introduced Dr. Michail Sermon and his services with ability to make referral. He verbalized understanding and expressed wishes to do so.   I discussed the importance of continued conversation with family and their medical providers regarding overall plan of care and treatment options, ensuring decisions are within the context of the patients values and GOCs.  Questions  and concerns were addressed.  Patient was encouraged to call with questions or concerns.  PMT will continue to support holistically as needed.  Life Review: Christopher Dawson lives in the home alone. He has never married and no children. His mother is alive and healthy. She is his biggest support system. He obtained a degree from [employer]. He is a retired Warehouse manager. Christopher Dawson faith.   Objective:   Primary Diagnoses: Present on Admission:  Intractable back pain   Scheduled Meds:  clomiPRAMINE  100 mg Oral QHS   dexamethasone (DECADRON) injection  4 mg Intravenous Q6H   feeding supplement  237 mL Oral BID BM   folic acid  1 mg Oral Daily   heparin injection (subcutaneous)  5,000 Units Subcutaneous Q8H   multivitamin with minerals  1 tablet Oral Daily   nicotine  14 mg Transdermal Q24H   pantoprazole  40 mg Oral Daily   thiamine  100 mg Oral Daily    Continuous Infusions:  sodium chloride 75 mL/hr at 04/24/21 0042    PRN Meds: acetaminophen **OR** acetaminophen, HYDROmorphone (DILAUDID) injection, LORazepam **OR** LORazepam, ondansetron **OR** ondansetron (ZOFRAN) IV  Allergies  Allergen Reactions   Codeine Other (See Comments)    Made me feel "spaced out"    Review of Systems  Musculoskeletal:  Positive for back pain.  Neurological:  Positive for weakness.  Unless otherwise noted, a complete review of systems is negative.  Physical Exam General: NAD, thin, looks older than stated age Cardiovascular: regular rate and rhythm Pulmonary: clear ant fields Abdomen: soft, nontender, + bowel sounds Extremities: no edema, no joint deformities Skin: no rashes, warm and dry Neurological: AAO x3, mood appropriate   Vital Signs:  BP 118/81 (BP Location: Left Arm)    Pulse (!) 110    Temp 98 F (36.7 C) (Oral)    Resp 16    Ht 6' 3"  (1.905 m)    Wt 67.2 kg    SpO2 99%    BMI 18.52 kg/m  Pain Scale: 0-10 POSS *See Group Information*: 1-Acceptable,Awake and  alert Pain Score: 3   SpO2: SpO2: 99 % O2 Device:SpO2: 99 % O2 Flow Rate: .   IO: Intake/output summary:  Intake/Output Summary (Last 24 hours) at 04/24/2021 1058 Last data filed at 04/24/2021 0900 Gross per 24 hour  Intake 2347.04 ml  Output 350 ml  Net 1997.04 ml    LBM: Last BM Date: 04/23/21 Baseline Weight: Weight: 67.2 kg Most recent weight: Weight: 67.2 kg      Palliative Assessment/Data: PPS 40%   Advanced Care Planning:   Primary Decision Maker: PATIENT-in the event he is unable to speak for himself his mother would be his surrogate decision maker  Code Status/Advance Care Planning: Full code  A discussion was had today regarding advanced directives. Concepts specific to code status, artifical feeding and hydration, continued IV antibiotics and rehospitalization was had. He does not have an advanced directive however states his mother, Christopher Dawson would be his medical decision maker if needed.    I discussed at length patient's full code status with consideration of his  current illness. Christopher Dawson would like all heroic measures however would not want to be intubated. States if it came to that he would wish to be allowed a natural death.   Palliative Care services outpatient were explained and offered. Patient verbalized understanding and awareness of palliative's goals and philosophy of care. He request ongoing support. He is aware I am available to continue supporting while hospitalized and also at [facility] if he chooses.   Assessment & Plan:   SUMMARY OF RECOMMENDATIONS   Limited Code-NO INTUBATION as requested & confirmed by patient.  Continue with current plan of care. Christopher Dawson is clear in expressed wishes to continue to treat aggressively. He is open to all medical interventions with a goal of allowing him every opportunity to improve and thrive.  Recommend ongoing palliative support for goals of care and symptom management. He is aware I am available  outpatient to continue following at [facility]. Request follow-up appointment.   PMT will continue to support and follow as needed. Please call team line with urgent needs.  Symptom Management:  Back Pain Dexamethasone 10m IV  Dilaudid 0.5 mg IV as needed Would consider transitioning to oral medication Oxycodone as needed with IV for breakthrough not relieved by orals once work-up and plan is finalized in addition to preparation for discharge. Will continue to evaluate for need of long-acting medication regimen.  Anxiety/Depression No current regimen. Recommended outpatient follow-up with Dr. SMichail Sermon(Psychiatry). Patient verbalized agreement. Referral sent   Palliative Prophylaxis:  Frequent Pain Assessment  Additional Recommendations (Limitations, Scope, Preferences): Treat the treatable, all heroic measures with limitations of no intubation.   Psycho-social/Spiritual:  Desire for further Chaplaincy support: no Additional Recommendations:  ongoing goals of care, support, and symptom management.   Prognosis:  Guarded   Discharge Planning:  To Be Determined     Patient expressed understanding and was in agreement with this plan.   Time Total: 55 min.   Visit consisted of counseling and education dealing with the complex and emotionally intense issues of symptom management and palliative care in the setting of serious and potentially life-threatening illness.Greater than 50%  of this time was spent counseling and coordinating care related to the above assessment and plan.  Signed by:  NAlda Lea AGPCNP-BC Palliative Medicine Team  Phone: [phone]: [phone]: NBjorn Pippin  Thank you for allowing the Palliative Medicine Team to assist in the care of this patient. Please utilize secure chat with additional questions, if there is no response within 30 minutes please call the above phone number. Palliative Medicine Team providers are  available by phone from 7am to 5pm daily and can be reached through the team cell phone.  Should this patient require assistance outside of these hours, please call the patient's attending physician.

## 2021-04-24 NOTE — Progress Notes (Signed)
Pad soaked with urine upon rounding.   Patient refused pad/gown change at this time. Pain medication offered to improve comfort before changing pad as patient is here for intractable back pain. Continued to refuse even after patient education.   [place], Therapist, sports

## 2021-04-24 NOTE — Consult Note (Addendum)
New Hematology/Oncology Consult   Requesting ZO:XWRUEA, Kc        Reason for Consult: Lung mass, spine lesions  HPI: Christopher Dawson presented to the emergency room yesterday with a 3 week history of increased back pain.  He has a history of chronic back pain.  He has a history of difficulty ambulating for the past 8 months and uses a cane.  He says this is due to balance difficulty.  One of his legs is shorter.   A plain x-ray of the lumbar spine revealed a questionable compression deformity at T12. 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  A 2 cm lytic lesion was noted in the left lateral aspect of T9.  Paravertebral soft tissue suggestive tumor was noted at T11-L1 with a possible superimposed hematoma.  No suspicious lesions in the lumbar spine.  CTs of the chest, abdomen, and pelvis revealed a 1.8 cm spiculated left upper lobe nodule, a 2 cm lytic lesion at T9, a lytic destructive lesion at T12 with epidural extension.  A 1.5 cm indeterminate right adrenal mass was noted.  Changes of COPD.  Enlarged prostate.  Constipation.  Haziness in the lingula.  2 slightly prominent left hilar nodes.  He was admitted and placed on Decadron.  He reports no difficulty with bowel or bladder control.  The pain is slightly improved compared to admission yesterday.  He reports his current leg weakness has been present for 3 weeks.  An MRI of the thoracic and lumbar spine are ordered for 04/24/2021.    Past Medical History:  Diagnosis Date   Anxiety    Depression    Lumbar radiculopathy, chronic 2008   Marfan syndrome    Neurosis, depressive    Shortening, leg, congenital   :  .  Cerebral palsy   Past Surgical History:  Procedure Laterality Date   LEG SURGERY for cerebral palsy as a child    :   Current Facility-Administered Medications:     0.9 %  sodium chloride infusion, , Intravenous, Continuous, Barton Dubois, MD, Last Rate: 75 mL/hr at 04/24/21 0042, New Bag at 04/24/21 0042   acetaminophen (TYLENOL) tablet 650 mg, 650 mg, Oral, Q6H PRN, 650 mg at 04/24/21 0049 **OR** acetaminophen (TYLENOL) suppository 650 mg, 650 mg, Rectal, Q6H PRN, Barton Dubois, MD   clomiPRAMINE (ANAFRANIL) capsule 100 mg, 100 mg, Oral, QHS, Barton Dubois, MD   dexamethasone (DECADRON) injection 4 mg, 4 mg, Intravenous, Q6H, Barton Dubois, MD, 4 mg at 04/24/21 0620   feeding supplement (ENSURE ENLIVE / ENSURE PLUS) liquid 237 mL, 237 mL, Oral, BID BM, Barton Dubois, MD, 237 mL at 54/09/81 1914   folic acid (FOLVITE) tablet 1 mg, 1 mg, Oral, Daily, Barton Dubois, MD, 1 mg at 04/24/21 7829   heparin injection 5,000 Units, 5,000 Units, Subcutaneous, Q8H, Kc, Ramesh, MD   HYDROmorphone (DILAUDID) injection 0.5 mg, 0.5 mg, Intravenous, Q4H PRN, Kc, Ramesh, MD   LORazepam (ATIVAN) tablet 1-4 mg, 1-4 mg, Oral, Q1H PRN **OR** LORazepam (ATIVAN) injection 1-4 mg, 1-4 mg, Intravenous, Q1H PRN, Barton Dubois, MD   multivitamin with minerals tablet 1 tablet, 1 tablet, Oral, Daily, Barton Dubois, MD, 1 tablet at 04/24/21 5621   nicotine (NICODERM CQ - dosed in mg/24 hours) patch 14 mg, 14 mg, Transdermal, Q24H, Barton Dubois, MD, 14 mg at 04/24/21 0043   ondansetron (ZOFRAN) tablet 4  mg, 4 mg, Oral, Q6H PRN **OR** ondansetron (ZOFRAN) injection 4 mg, 4 mg, Intravenous, Q6H PRN, Barton Dubois, MD   pantoprazole (PROTONIX) EC tablet 40 mg, 40 mg, Oral, Daily, Barton Dubois, MD, 40 mg at 04/24/21 3710   thiamine tablet 100 mg, 100 mg, Oral, Daily, Barton Dubois, MD, 100 mg at 04/24/21 6269:   clomiPRAMINE  100 mg Oral QHS   dexamethasone (DECADRON) injection  4 mg Intravenous Q6H   feeding supplement  237 mL Oral BID BM   folic acid  1 mg Oral Daily   heparin injection (subcutaneous)  5,000 Units Subcutaneous Q8H   multivitamin with minerals  1 tablet  Oral Daily   nicotine  14 mg Transdermal Q24H   pantoprazole  40 mg Oral Daily   thiamine  100 mg Oral Daily  :   Allergies  Allergen Reactions   Codeine Other (See Comments)    Made me feel "spaced out"  :  FH: Father died of lung cancer and was a smoker.  A brother died of bile duct cancer.  His mother had thyroid cancer at age 41 and breast cancer in her 48s.  His maternal grandfather died of colon cancer in his 22s.  His paternal grandfather had lung cancer.  Paternal uncle had kidney cancer.  SOCIAL HISTORY: He lives alone in [place].  He is retired.  He previously worked in a [employer], applications.  He smokes cigarettes and drinks 5 beers per day.  No risk factor for HIV or hepatitis.  He uses a taxi for transportation.  Review of Systems:  Positives include: Severe lower back pain for 3 weeks, exertional dyspnea, difficulty ambulating secondary to poor balance and a short leg  A complete ROS was otherwise negative.   Physical Exam:  Blood pressure 118/81, pulse (!) 110, temperature 98 F (36.7 C), temperature source Oral, resp. rate 16, height 6\' 3"  (1.905 m), weight 148 lb 2.4 oz (67.2 kg), SpO2 99 %.  HEENT: No thrush Lungs: Clear bilaterally Cardiac: Regular rate and rhythm, tachycardia Abdomen: No hepatosplenomegaly, no mass, nontender GU: Testes without mass Vascular: No leg edema Lymph nodes: No cervical, supraclavicular, axillary, or inguinal nodes Neurologic: Alert and oriented, the motor exam appears intact in the upper and lower extremities bilaterally, sensation is intact to light touch in the legs, he had difficulty standing secondary to proximal leg weakness and loss of balance Skin: Black material beneath the fingernails, dryness/crusting and fungal changes at the feet bilaterally Musculoskeletal: No spine tenderness  LABS:   Recent Labs    04/23/21 2213 04/24/21 0307  WBC 8.6 4.5  HGB 13.9 13.7  HCT 42.3 41.7  PLT 338 315     Recent Labs     04/23/21 1619 04/23/21 2213 04/24/21 0307  NA 136  --  136  K 3.7  --  3.9  CL 102  --  106  CO2 25  --  23  GLUCOSE 90  --  179*  BUN 19  --  17  CREATININE 0.57* 0.39* 0.48*  CALCIUM 9.1  --  9.0      RADIOLOGY:  DG Lumbar Spine Complete  Result Date: 04/23/2021 CLINICAL DATA:  Back pain. EXAM: LUMBAR SPINE - COMPLETE 4+ VIEW COMPARISON:  None. FINDINGS: There is dextroconvex curvature of the lumbar spine. There is no evidence for acute fracture or dislocation of the lumbar spine. There is questionable mild compression deformity of T12, age indeterminate. Mild degenerative endplate osteophytes are seen throughout the lumbar spine.  Disc spaces are grossly maintained. Soft tissues are within normal limits. IMPRESSION: 1. Questionable mild compression deformity of T12, age indeterminate. Correlate for point tenderness. Consider dedicated imaging of this level. 2. Levoconvex curvature of the lumbar spine. 3. Mild degenerative changes. Electronically Signed   By: Ronney Asters M.D.   On: 04/23/2021 16:55   CT Thoracic Spine Wo Contrast  Result Date: 04/23/2021 CLINICAL DATA:  Worsening chronic back pain. EXAM: CT THORACIC AND LUMBAR SPINE WITHOUT CONTRAST TECHNIQUE: Multidetector CT imaging of the thoracic and lumbar spine was performed without contrast. Multiplanar CT image reconstructions were also generated. COMPARISON:  Lumbar spine radiographs 04/23/2021 FINDINGS: CT THORACIC SPINE FINDINGS Alignment: Exaggerated upper thoracic kyphosis.  No listhesis. Vertebrae: Permeative lytic destructive lesion of the vertebral body and left-sided posterior elements at T12 with associated pathologic fracture and mild vertebral body height loss. 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 2 cm lytic lesion in the left lateral aspect of the T9 vertebral body with extraosseous tumor extension. Paraspinal and other soft tissues: Lower thoracic paravertebral soft  tissue suggestive of tumor, possibly with superimposed hematoma, extending from T11-L1. Centrilobular and paraseptal emphysema. Disc levels: Mild-to-moderate thoracic spondylosis. At least mild spinal stenosis is suspected at T12 due to epidural tumor. Tumor also encroaches on the left-sided neural foramina at T11-12 and T12-L1. CT LUMBAR SPINE FINDINGS Segmentation: 5 lumbar type vertebrae. Alignment: Trace retrolisthesis of L4 on L5 and L5 on S1. Slight right convex curvature of the upper lumbar spine. Vertebrae: No acute fracture or suspicious osseous lesion. Paraspinal and other soft tissues: Mild abdominal aortic atherosclerosis. Disc levels: Mild disc degeneration, greatest at L4-5 where disc bulging, spurring, and mild facet hypertrophy result in borderline spinal stenosis and mild right lateral recess and right neural foraminal stenosis. IMPRESSION: 1. Destructive lesion involving the T12 vertebral body and left-sided posterior elements with pathologic vertebral body fracture and epidural tumor resulting in at least mild spinal stenosis. Smaller lesion in the T9 vertebral body. Findings are most concerning for metastatic disease. 2. Aortic Atherosclerosis (ICD10-I70.0) and Emphysema (ICD10-J43.9). Electronically Signed   By: Logan Bores M.D.   On: 04/23/2021 18:41   CT Lumbar Spine Wo Contrast  Result Date: 04/23/2021 CLINICAL DATA:  Worsening chronic back pain. EXAM: CT THORACIC AND LUMBAR SPINE WITHOUT CONTRAST TECHNIQUE: Multidetector CT imaging of the thoracic and lumbar spine was performed without contrast. Multiplanar CT image reconstructions were also generated. COMPARISON:  Lumbar spine radiographs 04/23/2021 FINDINGS: CT THORACIC SPINE FINDINGS Alignment: Exaggerated upper thoracic kyphosis.  No listhesis. Vertebrae: Permeative lytic destructive lesion of the vertebral body and left-sided posterior elements at T12 with associated pathologic fracture and mild vertebral body height loss.  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 2 cm lytic lesion in the left lateral aspect of the T9 vertebral body with extraosseous tumor extension. Paraspinal and other soft tissues: Lower thoracic paravertebral soft tissue suggestive of tumor, possibly with superimposed hematoma, extending from T11-L1. Centrilobular and paraseptal emphysema. Disc levels: Mild-to-moderate thoracic spondylosis. At least mild spinal stenosis is suspected at T12 due to epidural tumor. Tumor also encroaches on the left-sided neural foramina at T11-12 and T12-L1. CT LUMBAR SPINE FINDINGS Segmentation: 5 lumbar type vertebrae. Alignment: Trace retrolisthesis of L4 on L5 and L5 on S1. Slight right convex curvature of the upper lumbar spine. Vertebrae: No acute fracture or suspicious osseous lesion. Paraspinal and other soft tissues: Mild abdominal aortic atherosclerosis. Disc levels: Mild disc degeneration, greatest  at L4-5 where disc bulging, spurring, and mild facet hypertrophy result in borderline spinal stenosis and mild right lateral recess and right neural foraminal stenosis. IMPRESSION: 1. Destructive lesion involving the T12 vertebral body and left-sided posterior elements with pathologic vertebral body fracture and epidural tumor resulting in at least mild spinal stenosis. Smaller lesion in the T9 vertebral body. Findings are most concerning for metastatic disease. 2. Aortic Atherosclerosis (ICD10-I70.0) and Emphysema (ICD10-J43.9). Electronically Signed   By: Logan Bores M.D.   On: 04/23/2021 18:41   CT CHEST ABDOMEN PELVIS W CONTRAST  Result Date: 04/23/2021 CLINICAL DATA:  Permeative lytic destructive lesion of the T12 vertebral body and left-sided posterior elements with pathologic fracture and mild vertebral height loss, worrisome for metastatic disease. Evaluate for primary. 2 cm lytic lesion left lateral T9 body. EXAM: CT CHEST, ABDOMEN, AND PELVIS WITH CONTRAST TECHNIQUE:  Multidetector CT imaging of the chest, abdomen and pelvis was performed following the standard protocol during bolus administration of intravenous contrast. CONTRAST:  36mL OMNIPAQUE IOHEXOL 350 MG/ML SOLN COMPARISON:  CT thoracic and lumbar spine earlier today. FINDINGS: CT CHEST FINDINGS Cardiovascular: The cardiac size is normal. There are scattered calcifications in the LAD coronary artery. Early changes of aortic atherosclerosis. 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 There is no arteriovenous dilatation or central arterial embolus. No pericardial effusion. Mediastinum/Nodes: There are slightly prominent left hilar lymph nodes with a lymph node measuring 1.2 cm in short axis in the anterior left hilum on series 2 axial 36 and another more posteriorly measuring 1 cm in short axis on axial 41. There is no bulky, encasing or further intrathoracic adenopathy, no mediastinal or axillary mass or thyroid lesions. There is no tracheal or central airway filling defect or significant esophageal thickening. Minimally small hiatal hernia. Lungs/Pleura: No pleural effusion, thickening or pneumothorax. There is a spiculated mass in the left upper lobe laterally at mid field measuring 1.8 x 1.6 x 1.0 cm, with pleural stranding lateral to it. 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 Bilateral apical pleuroparenchymal scar-like opacities are noted, with bilateral paraseptal emphysematous changes in the lungs predominating in the upper lobes, relatively milder centrilobular changes lung apices. There are patchy hazy opacities in the lingula anteriorly which could be chronic change or pneumonitis. There are scattered linear scar-like opacities in the bases. No other nodules or infiltrates are observed. Musculoskeletal: Permeative lytic destructive lesion of the T12 vertebral body is again noted with pathologic mild anterior wedge  compression fracture and with epidural tumor extension ventrally eccentric to the left partially effacing the ventral CSF and left hemicanal and destroying the left T12 pedicle. There is left paraspinal tumor extension at the level of the vertebral body as well. A 2 cm T9 vertebral body lytic lesion is again noted to the left anteriorly, with adjacent paraspinal soft tissue thickening without intracanalicular extension. No other focal bone lesions are seen in the thoracic osseous structures. Asymmetric elevation right hemidiaphragm is also seen. CT ABDOMEN PELVIS FINDINGS Hepatobiliary: Slightly steatotic liver without mass enhancement. The gallbladder and bile ducts unremarkable. Pancreas: Moderately fatty atrophic without mass enhancement or ductal dilatation. Spleen: Unremarkable. Adrenals/Urinary Tract: Indeterminate 1.5 cm heterogeneous right adrenal nodule. There is no left adrenal mass , no focal renal cortical abnormality. There are either are few small scattered nonobstructive caliceal stones in the kidneys or early contrast excretion mimicking stones. There is no obstructive stone or hydroureteronephrosis no bladder thickening.  Stomach/Bowel: The gastric wall is contracted. The unopacified small bowel demonstrating fold thickening in the duodenum and jejunum with suspected fold thickening in the underdistended proximal stomach. There is no small bowel obstruction or inflammation. The appendix is normal. Moderate constipation is seen. Left colonic diverticula without diverticulitis. There is moderate wall thickening versus nondistention in the rectum. Vascular/Lymphatic: Aortic atherosclerosis. No enlarged abdominal or pelvic lymph nodes. Reproductive: Enlarged prostate, 5.5 cm transverse with bladder base impression. Other: There is no free air, hemorrhage or fluid. Small umbilical fat hernia. Musculoskeletal: No regional destructive bone lesions. Moderate left-greater-than-right hip DJD. Degenerative  changes and mild dextroscoliosis lumbar spine. IMPRESSION: 1. 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 There are 2 slightly prominent left hilar lymph nodes but no further chest adenopathy. 2. 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 3. Patchy haziness in the anterior lingula which could be chronic change or pneumonitis. 4. Indeterminate heterogeneous 1.5 cm right adrenal mass, possible metastasis. PET-CT may be helpful. 5. COPD and scarring change. 6. 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 No AAA. 7. Moderate thickening of the rectum versus underdistention. Further evaluation recommended. 8. Enlarged prostate. 9. Constipation, and additional findings suggesting gastroenteritis. No small bowel obstruction or inflammatory changes. 10. No other CT evidence of metastatic disease. Additional findings discussed above. Electronically Signed   By: Telford Nab M.D.   On: 04/23/2021 21:03    Assessment and Plan:   Left lung mass, small left hilar lymph nodes Destructive lesion involving T12 with epidural extension, T9 lytic lesion Right adrenal mass Pain secondary to #2 Leg weakness secondary to #2 Marfan syndrome Depression Tobacco and alcohol use  Mr Levit is admitted back pain.  He has multiple spine lesions including a destructive mass at T12.  There is a left lung mass.  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  He does not appear to have an acute cord compression syndrome.  His increased pain and current leg weakness has been present for approximately 3 weeks.  He appears neurologically intact  other than proximal leg weakness.  I reviewed the CT images.  I discussed the probable diagnosis with him.  He will need a diagnostic biopsy prior to making a treatment plan.  I discussed the case with Dr. Tammi Klippel.  He will see Mr Conry consultation.  I will recommend systemic treatment options once a diagnosis is established.  Recommendations: Continue Decadron Decision on a surgical biopsy and decompression after review of the MRI images by the neurosurgical service Radiation oncology consult-I contacted Dr. Tammi Klippel PSA OT/PT Narcotic analgesics as needed for pain-I will add oxycodone Oncology will continue following in the hospital and outpatient follow-up will be scheduled at the Cancer center  Betsy Coder, MD 04/24/2021, 12:56 PM

## 2021-04-24 NOTE — Progress Notes (Addendum)
PROGRESS NOTE    Christopher Dawson  YCX:[id number] DOB: 05-Jul-1955 DOA: 04/23/2021 PCP: [name], MD   Chief Complaint  Patient presents with   Back Pain   Brief Narrative/Hospital Course:  [name], 66 y.o. male with PMH of anxiety/depression, chronic lumbar radiculopathy, tobacco abuse, alcohol abuse and ongoing physical deconditioning for the last 6 months using cane/walker at home presented with intractable back pain and inability to ambulate, or get up to cook his meals or by groceries. He is a longstanding smoker at least for 45 years quit about 8 years ago, started at the age of 19. Complains of constipation no urinary issues. In the ED -CT scan demonstrated what appears to be positive T12 and T9 vertebra process/distraction with concern for metastatic malignant lesions.  Neurosurgery and oncology as discussed CT chest abdomen pelvis and MRI ordered to evaluate further and patient was admitted CT chest showing- "1.8 x 1.6 x 1.0 cm left upper lobe spiculated nodule with pleural stranding, which should be considered a primary neoplasm until proven otherwise. There are 2 slightly prominent left hilar lymph nodes but no further chest adenopathy, left paraspinal tumor extension at the level of vertebral body, 2 cm in T9 vertebral body lytic lesion with adjacent paraspinal soft tissue thickening, 1.5 cm heterogeneous right adrenal nodule.  Subjective: Seen and examined this morning Co low back pain mostly on moving, constipation no incontinence or urine retenition or leg weakenss of numbness.  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 Continue IV Decadron, pain control with oral and IV opiates, oncology has been consulted neurosurgeon consulted, awaiting for MRI spine.  Tobacco abuse cessation advised, cont nicotine patch Physical deconditioning in the setting of #1 hold off on PT OT for now until we know that he is stable Depression continue his home regimen Alcohol abuse cessation has been counseled watch for withdrawal, CIWA protocol thiamine folate Moderate protein calorie malnutrition with low BMI 18-augment diet as tolerated.  In the setting of malignancy  Addendum: Mri resulted- I discussed with Dr Annette Stable- he suggested as below: "Can have IR do t12 biopsy with possible vertebroplasty. No role for decompression/Fusion at this point. Will need xrtx to spinal Mets"   DVT prophylaxis: heparin injection 5,000 Units Start: 04/24/21 2200 Code Status:   Code Status: Full Code Family Communication: plan of care discussed with patient at bedside. Status is: Inpatient Remains inpatient appropriate because: Intractable back pain and malignancy work-up Disposition: Currently not medically stable for discharge. Anticipated Disposition: TBD  Objective: Vitals last 24 hrs: Vitals:   04/23/21 2100 04/23/21 2200 04/23/21 2343 04/24/21 0626  BP: (!) 143/87 (!) 149/87 128/90 (!) 118/95  Pulse: 99 100 (!) 102 (!) 108  Resp:  18 16 18   Temp:   98 F (36.7 C) 98 F (36.7 C)  TempSrc:   Oral Oral  SpO2: 96% 96% 95% 100%  Weight:   67.2 kg   Height:   6\' 3"  (1.905 m)    Weight change:   Intake/Output Summary (Last 24 hours) at 04/24/2021 1015 Last data filed at 04/24/2021 0900 Gross per 24 hour  Intake 2347.04 ml  Output 350 ml  Net 1997.04 ml   Net IO Since Admission: 1,997.04 mL [04/24/21 1015]   Physical Examination: General exam: Aa0x3, thin looking weak,older than stated age. HEENT:Oral mucosa moist, Ear/Nose WNL grossly,dentition normal. Respiratory system:  B/l clear BS, no use of accessory muscle, non tender. Cardiovascular system: S1 & S2 +,No JVD. Gastrointestinal system: Abdomen soft, NT,ND, BS+. Nervous System:Alert, awake, moving extremities. Extremities: edema none, distal peripheral pulses palpable.  Skin: No rashes, no icterus. MSK: Normal muscle bulk, tone, power.  Medications reviewed:  Scheduled Meds:  clomiPRAMINE  100 mg Oral QHS   dexamethasone (DECADRON) injection  4 mg Intravenous Q6H   feeding supplement  237 mL Oral BID BM   folic acid  1 mg Oral Daily   heparin injection (subcutaneous)  5,000 Units Subcutaneous Q8H   multivitamin with minerals  1 tablet Oral Daily   nicotine  14 mg Transdermal Q24H   pantoprazole  40 mg Oral Daily   thiamine  100 mg Oral Daily   Continuous Infusions:  sodium chloride 75 mL/hr at 04/24/21 0042   Diet Order             Diet regular Room service appropriate? Yes; Fluid consistency: Thin  Diet effective now                 Weight change:   Wt Readings from Last 3 Encounters:  04/23/21 67.2 kg  11/25/20 92.3 kg  09/24/19 86.2 kg     Consultants:see note  Procedures:see note Antimicrobials: Anti-infectives (From admission, onward)    None      Culture/Microbiology Other culture-see note  Unresulted Labs (From admission, onward)     Start     Ordered   04/25/21 5885  Basic metabolic panel  Tomorrow morning,   R        04/24/21 1015   04/25/21 0500  CBC  Tomorrow morning,   R        04/24/21 1015           Data Reviewed: I have personally reviewed following labs and imaging studies CBC: Recent Labs  Lab 04/23/21 1619 04/23/21 2213 04/24/21 0307  WBC 10.6* 8.6 4.5  NEUTROABS 7.8*  --   --   HGB 15.8 13.9 13.7  HCT 47.3 42.3 41.7  MCV 89.6 89.8 90.1  PLT 376 338 027   Basic Metabolic Panel: Recent Labs  Lab 04/23/21 1619 04/23/21 2213 04/24/21 0307  NA 136  --  136  K 3.7  --  3.9  CL 102  --  106  CO2 25  --  23  GLUCOSE 90  --  179*   BUN 19  --  17  CREATININE 0.57* 0.39* 0.48*  CALCIUM 9.1  --  9.0  MG  --  2.1  --   PHOS  --  3.1  --    GFR: Estimated Creatinine Clearance: 86.3 mL/min (A) (by C-G formula based on SCr of 0.48 mg/dL (L)). Liver Function Tests: Recent Labs  Lab 04/24/21 0307  AST 13*  ALT 15  ALKPHOS 68  BILITOT 0.7  PROT 6.5  ALBUMIN 3.1*   No results for input(s): LIPASE, AMYLASE in the last 168 hours. No results for input(s): AMMONIA in the last 168 hours. Coagulation Profile: No results for input(s): INR, PROTIME in the last 168 hours. Cardiac Enzymes: No results for input(s): CKTOTAL, CKMB, CKMBINDEX, TROPONINI in the last 168 hours. BNP (last 3 results) No results for input(s): PROBNP in the last 8760 hours.  HbA1C: No results for input(s): HGBA1C in the last 72 hours. CBG: No results for input(s): GLUCAP in the last 168 hours. Lipid Profile: No results for input(s): CHOL, HDL, LDLCALC, TRIG, CHOLHDL, LDLDIRECT in the last 72 hours. Thyroid Function Tests: Recent Labs    04/23/21 2213  TSH 1.861   Anemia Panel: No results for input(s): VITAMINB12, FOLATE, FERRITIN, TIBC, IRON, RETICCTPCT in the last 72 hours. Sepsis Labs: No results for input(s): PROCALCITON, LATICACIDVEN in the last 168 hours.  Recent Results (from the past 240 hour(s))  Resp Panel by RT-PCR (Flu A&B, Covid) Nasopharyngeal Swab     Status: None   Collection Time: 04/23/21  8:05 PM   Specimen: Nasopharyngeal Swab; Nasopharyngeal(NP) swabs in vial transport medium  Result Value Ref Range Status   SARS Coronavirus 2 by RT PCR NEGATIVE NEGATIVE Final    Comment: (NOTE) SARS-CoV-2 target nucleic acids are NOT DETECTED.  The SARS-CoV-2 RNA is generally detectable in upper respiratory specimens during the acute phase of infection. The lowest concentration of SARS-CoV-2 viral copies this assay can detect is 138 copies/mL. A negative result does not preclude SARS-Cov-2 infection and should not be used as the  sole basis for treatment or other patient management decisions. A negative result may occur with  improper specimen collection/handling, submission of specimen other than nasopharyngeal swab, presence of viral mutation(s) within the areas targeted by this assay, and inadequate number of viral copies(<138 copies/mL). A negative result must be combined with clinical observations, patient history, and epidemiological information. The expected result is Negative.  Fact Sheet for Patients:  EntrepreneurPulse.com.au  Fact Sheet for Healthcare Providers:  IncredibleEmployment.be  This test is no t yet approved or cleared by the [place] FDA and  has been authorized for detection and/or diagnosis of SARS-CoV-2 by FDA under an Emergency Use Authorization (EUA). This EUA will remain  in effect (meaning this test can be used) for the duration of the COVID-19 declaration under Section 564(b)(1) of the Act, 21 U.S.C.section 360bbb-3(b)(1), unless the authorization is terminated  or revoked sooner.       Influenza A by PCR NEGATIVE NEGATIVE Final   Influenza B by PCR NEGATIVE NEGATIVE Final    Comment: (NOTE) The Xpert Xpress SARS-CoV-2/FLU/RSV plus assay is intended as an aid in the diagnosis of influenza from Nasopharyngeal swab specimens and should not be used as a sole basis for treatment. Nasal washings and aspirates are unacceptable for Xpert Xpress SARS-CoV-2/FLU/RSV testing.  Fact Sheet for Patients: EntrepreneurPulse.com.au  Fact Sheet for Healthcare Providers: IncredibleEmployment.be  This test is not yet approved or cleared by the [place] FDA and has been authorized for detection and/or diagnosis of SARS-CoV-2 by FDA under an Emergency Use Authorization (EUA). This EUA will remain in effect (meaning this test can be used) for the duration of the COVID-19 declaration under Section 564(b)(1) of the  Act, 21 U.S.C. section 360bbb-3(b)(1), unless the authorization is terminated or revoked.  Performed at [facility], [place] [address], [place], [place] [address]      Radiology Studies: DG Lumbar Spine Complete  Result Date: 04/23/2021 CLINICAL DATA:  Back pain. EXAM: LUMBAR SPINE - COMPLETE 4+ VIEW COMPARISON:  None. FINDINGS: There is dextroconvex curvature of the lumbar spine. There is no evidence for acute fracture or dislocation of the lumbar spine. There is questionable mild compression deformity of T12, age indeterminate. Mild degenerative endplate osteophytes are seen throughout the lumbar spine. Disc spaces are grossly maintained. Soft tissues are within normal  limits. IMPRESSION: 1. Questionable mild compression deformity of T12, age indeterminate. Correlate for point tenderness. Consider dedicated imaging of this level. 2. Levoconvex curvature of the lumbar spine. 3. Mild degenerative changes. Electronically Signed   By: Ronney Asters M.D.   On: 04/23/2021 16:55   CT Thoracic Spine Wo Contrast  Result Date: 04/23/2021 CLINICAL DATA:  Worsening chronic back pain. EXAM: CT THORACIC AND LUMBAR SPINE WITHOUT CONTRAST TECHNIQUE: Multidetector CT imaging of the thoracic and lumbar spine was performed without contrast. Multiplanar CT image reconstructions were also generated. COMPARISON:  Lumbar spine radiographs 04/23/2021 FINDINGS: CT THORACIC SPINE FINDINGS Alignment: Exaggerated upper thoracic kyphosis.  No listhesis. Vertebrae: Permeative lytic destructive lesion of the vertebral body and left-sided posterior elements at T12 with associated pathologic fracture and mild vertebral body height loss. 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 2 cm lytic lesion in the left lateral aspect of the T9 vertebral body with extraosseous tumor extension. Paraspinal and other soft tissues: Lower thoracic paravertebral soft tissue suggestive  of tumor, possibly with superimposed hematoma, extending from T11-L1. Centrilobular and paraseptal emphysema. Disc levels: Mild-to-moderate thoracic spondylosis. At least mild spinal stenosis is suspected at T12 due to epidural tumor. Tumor also encroaches on the left-sided neural foramina at T11-12 and T12-L1. CT LUMBAR SPINE FINDINGS Segmentation: 5 lumbar type vertebrae. Alignment: Trace retrolisthesis of L4 on L5 and L5 on S1. Slight right convex curvature of the upper lumbar spine. Vertebrae: No acute fracture or suspicious osseous lesion. Paraspinal and other soft tissues: Mild abdominal aortic atherosclerosis. Disc levels: Mild disc degeneration, greatest at L4-5 where disc bulging, spurring, and mild facet hypertrophy result in borderline spinal stenosis and mild right lateral recess and right neural foraminal stenosis. IMPRESSION: 1. Destructive lesion involving the T12 vertebral body and left-sided posterior elements with pathologic vertebral body fracture and epidural tumor resulting in at least mild spinal stenosis. Smaller lesion in the T9 vertebral body. Findings are most concerning for metastatic disease. 2. Aortic Atherosclerosis (ICD10-I70.0) and Emphysema (ICD10-J43.9). Electronically Signed   By: Logan Bores M.D.   On: 04/23/2021 18:41   CT Lumbar Spine Wo Contrast  Result Date: 04/23/2021 CLINICAL DATA:  Worsening chronic back pain. EXAM: CT THORACIC AND LUMBAR SPINE WITHOUT CONTRAST TECHNIQUE: Multidetector CT imaging of the thoracic and lumbar spine was performed without contrast. Multiplanar CT image reconstructions were also generated. COMPARISON:  Lumbar spine radiographs 04/23/2021 FINDINGS: CT THORACIC SPINE FINDINGS Alignment: Exaggerated upper thoracic kyphosis.  No listhesis. Vertebrae: Permeative lytic destructive lesion of the vertebral body and left-sided posterior elements at T12 with associated pathologic fracture and mild vertebral body height loss. Evidence of extraosseous  tumor including ventral and left-sided epidural tumor at T12. 2 cm lytic lesion in the left lateral aspect of the T9 vertebral body with extraosseous tumor extension. Paraspinal and other soft tissues: Lower thoracic paravertebral soft tissue suggestive of tumor, possibly with superimposed hematoma, extending from T11-L1. Centrilobular and paraseptal emphysema. Disc levels: Mild-to-moderate thoracic spondylosis. At least mild spinal stenosis is suspected at T12 due to epidural tumor. Tumor also encroaches on the left-sided neural foramina at T11-12 and T12-L1. CT LUMBAR SPINE FINDINGS Segmentation: 5 lumbar type vertebrae. Alignment: Trace retrolisthesis of L4 on L5 and L5 on S1. Slight right convex curvature of the upper lumbar spine. Vertebrae: No acute fracture or suspicious osseous lesion. Paraspinal and other soft tissues: Mild abdominal aortic atherosclerosis. Disc levels: Mild disc degeneration, greatest at L4-5 where disc bulging, spurring, and mild facet hypertrophy  result in borderline spinal stenosis and mild right lateral recess and right neural foraminal stenosis. IMPRESSION: 1. Destructive lesion involving the T12 vertebral body and left-sided posterior elements with pathologic vertebral body fracture and epidural tumor resulting in at least mild spinal stenosis. Smaller lesion in the T9 vertebral body. Findings are most concerning for metastatic disease. 2. Aortic Atherosclerosis (ICD10-I70.0) and Emphysema (ICD10-J43.9). Electronically Signed   By: Logan Bores M.D.   On: 04/23/2021 18:41   CT CHEST ABDOMEN PELVIS W CONTRAST  Result Date: 04/23/2021 CLINICAL DATA:  Permeative lytic destructive lesion of the T12 vertebral body and left-sided posterior elements with pathologic fracture and mild vertebral height loss, worrisome for metastatic disease. Evaluate for primary. 2 cm lytic lesion left lateral T9 body. EXAM: CT CHEST, ABDOMEN, AND PELVIS WITH CONTRAST TECHNIQUE: Multidetector CT imaging  of the chest, abdomen and pelvis was performed following the standard protocol during bolus administration of intravenous contrast. CONTRAST:  41mL OMNIPAQUE IOHEXOL 350 MG/ML SOLN COMPARISON:  CT thoracic and lumbar spine earlier today. FINDINGS: CT CHEST FINDINGS Cardiovascular: The cardiac size is normal. There are scattered calcifications in the LAD coronary artery. Early changes of aortic atherosclerosis. 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 There is no arteriovenous dilatation or central arterial embolus. No pericardial effusion. Mediastinum/Nodes: There are slightly prominent left hilar lymph nodes with a lymph node measuring 1.2 cm in short axis in the anterior left hilum on series 2 axial 36 and another more posteriorly measuring 1 cm in short axis on axial 41. There is no bulky, encasing or further intrathoracic adenopathy, no mediastinal or axillary mass or thyroid lesions. There is no tracheal or central airway filling defect or significant esophageal thickening. Minimally small hiatal hernia. Lungs/Pleura: No pleural effusion, thickening or pneumothorax. There is a spiculated mass in the left upper lobe laterally at mid field measuring 1.8 x 1.6 x 1.0 cm, with pleural stranding lateral to it. 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 Bilateral apical pleuroparenchymal scar-like opacities are noted, with bilateral paraseptal emphysematous changes in the lungs predominating in the upper lobes, relatively milder centrilobular changes lung apices. There are patchy hazy opacities in the lingula anteriorly which could be chronic change or pneumonitis. There are scattered linear scar-like opacities in the bases. No other nodules or infiltrates are observed. Musculoskeletal: Permeative lytic destructive lesion of the T12 vertebral body is again noted with pathologic mild anterior wedge compression fracture and with  epidural tumor extension ventrally eccentric to the left partially effacing the ventral CSF and left hemicanal and destroying the left T12 pedicle. There is left paraspinal tumor extension at the level of the vertebral body as well. A 2 cm T9 vertebral body lytic lesion is again noted to the left anteriorly, with adjacent paraspinal soft tissue thickening without intracanalicular extension. No other focal bone lesions are seen in the thoracic osseous structures. Asymmetric elevation right hemidiaphragm is also seen. CT ABDOMEN PELVIS FINDINGS Hepatobiliary: Slightly steatotic liver without mass enhancement. The gallbladder and bile ducts unremarkable. Pancreas: Moderately fatty atrophic without mass enhancement or ductal dilatation. Spleen: Unremarkable. Adrenals/Urinary Tract: Indeterminate 1.5 cm heterogeneous right adrenal nodule. There is no left adrenal mass , no focal renal cortical abnormality. There are either are few small scattered nonobstructive caliceal stones in the kidneys or early contrast excretion mimicking stones. There is no obstructive stone or hydroureteronephrosis no bladder thickening. Stomach/Bowel: The gastric wall is contracted. The unopacified small bowel  demonstrating fold thickening in the duodenum and jejunum with suspected fold thickening in the underdistended proximal stomach. There is no small bowel obstruction or inflammation. The appendix is normal. Moderate constipation is seen. Left colonic diverticula without diverticulitis. There is moderate wall thickening versus nondistention in the rectum. Vascular/Lymphatic: Aortic atherosclerosis. No enlarged abdominal or pelvic lymph nodes. Reproductive: Enlarged prostate, 5.5 cm transverse with bladder base impression. Other: There is no free air, hemorrhage or fluid. Small umbilical fat hernia. Musculoskeletal: No regional destructive bone lesions. Moderate left-greater-than-right hip DJD. Degenerative changes and mild dextroscoliosis  lumbar spine. IMPRESSION: 1. 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 There are 2 slightly prominent left hilar lymph nodes but no further chest adenopathy. 2. 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 3. Patchy haziness in the anterior lingula which could be chronic change or pneumonitis. 4. Indeterminate heterogeneous 1.5 cm right adrenal mass, possible metastasis. PET-CT may be helpful. 5. COPD and scarring change. 6. 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 No AAA. 7. Moderate thickening of the rectum versus underdistention. Further evaluation recommended. 8. Enlarged prostate. 9. Constipation, and additional findings suggesting gastroenteritis. No small bowel obstruction or inflammatory changes. 10. No other CT evidence of metastatic disease. Additional findings discussed above. Electronically Signed   By: Telford Nab M.D.   On: 04/23/2021 21:03     LOS: 1 day   Antonieta Pert, MD [facility]  04/24/2021, 10:15 AM

## 2021-04-24 NOTE — Plan of Care (Signed)

## 2021-04-24 NOTE — Progress Notes (Signed)
°  Radiation Oncology         [phone] ________________________________  Name: Christopher Dawson MRN: [id number]  Date: 04/23/2021  DOB: 1955-12-21  Chart Note:  I was notified about this admission by Dr. Benay Spice.  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  MRI appears to show no spinal cord compression.  He'll ideally need biopsy to confirm malignancy.  Seen by Dr. Annette Stable from neurosurgery regarding potential surgical options.  The patient does not have spinal cord compression radiographically or clinically, but, may have some instability warranting surgery versus radiation therapy, pending biopsy.  Spine Instability Neoplastic Score (SINS): SINS Component Description Score  Location Junctional (Occ-C2, C7-T2, T11-L1, L5-S1) Mobile (C3-6, L2-4) Semirigid (T3-10) Rigid (S2-5) 3 2 1  0  Pain Yes Occasional, non-mechanical No 3 1 0  Bone Lesion Lytic Mixed Blastic 2 1 0  Alignment Subluxation/Translation De Novo deformity Normal 4 2 0  Vertebral Body >50% collapse <50% collapse No collapse >50% VB involved None of above 3 2 1  0  Posterolateral Involvment Bilateral Unilateral 3 1   Tallied Score from 6 Components: Stable Potentially Unstable Unstable  0-6 7-12 13-18   SINS Score: 15   Fisher CG, et al. A novel classification system for spinal instability in neoplastic disease: an evidence-based approach and expert consensus from the Spine Oncology Study Group. Spine  48(54):O2703-5, 2010   Will follow progress and formally consult on Tuesday unless he develops symptomatic spinal cord compression.  Call or text [phone] if needed.  ________________________________  Sheral Apley Tammi Klippel, M.D.

## 2021-04-24 NOTE — Progress Notes (Signed)
66 year old male presents with progressively worsening back pain.  Patient is still ambulatory but now requires a cane for assistance.  Patient remains continent of bowel and bladder.  Work-up so far is demonstrated evidence of a destructive lesion at the T12 level worrisome for metastatic disease.  There is some degree of pathologic fracture at this level.  There is no evidence of significant bony retropulsion or severe canal stenosis although there may be some epidural component of neoplasm.  (MRI scan pending).  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  Still awaiting MRI scan for treatment recommendations.  Continue steroids.

## 2021-04-24 NOTE — Progress Notes (Signed)
PT Cancellation Note  Patient Details Name: Quindon Denker MRN: [id number] DOB: 06-23-1955   Cancelled Treatment:     PT order received but eval deferred pending further work up and imaging (MRI ordered by neurosurgery) prior to therapy evaluation.    Aury Scollard 04/24/2021, 7:50 AM

## 2021-04-25 ENCOUNTER — Encounter (HOSPITAL_COMMUNITY): Payer: Self-pay | Admitting: Internal Medicine

## 2021-04-25 DIAGNOSIS — E279 Disorder of adrenal gland, unspecified: Secondary | ICD-10-CM | POA: Diagnosis not present

## 2021-04-25 DIAGNOSIS — M549 Dorsalgia, unspecified: Secondary | ICD-10-CM | POA: Diagnosis not present

## 2021-04-25 DIAGNOSIS — M899 Disorder of bone, unspecified: Secondary | ICD-10-CM | POA: Diagnosis not present

## 2021-04-25 DIAGNOSIS — R918 Other nonspecific abnormal finding of lung field: Secondary | ICD-10-CM | POA: Diagnosis not present

## 2021-04-25 DIAGNOSIS — M546 Pain in thoracic spine: Secondary | ICD-10-CM | POA: Diagnosis not present

## 2021-04-25 LAB — CBC
HCT: 39.1 % (ref 39.0–52.0)
Hemoglobin: 13 g/dL (ref 13.0–17.0)
MCH: 30.2 pg (ref 26.0–34.0)
MCHC: 33.2 g/dL (ref 30.0–36.0)
MCV: 90.9 fL (ref 80.0–100.0)
Platelets: 339 10*3/uL (ref 150–400)
RBC: 4.3 MIL/uL (ref 4.22–5.81)
RDW: 12.3 % (ref 11.5–15.5)
WBC: 15.8 10*3/uL — ABNORMAL HIGH (ref 4.0–10.5)
nRBC: 0 % (ref 0.0–0.2)

## 2021-04-25 LAB — BASIC METABOLIC PANEL
Anion gap: 4 — ABNORMAL LOW (ref 5–15)
BUN: 19 mg/dL (ref 8–23)
CO2: 25 mmol/L (ref 22–32)
Calcium: 8.8 mg/dL — ABNORMAL LOW (ref 8.9–10.3)
Chloride: 110 mmol/L (ref 98–111)
Creatinine, Ser: 0.53 mg/dL — ABNORMAL LOW (ref 0.61–1.24)
GFR, Estimated: >60 mL/min (ref >60)
Glucose, Bld: 165 mg/dL — ABNORMAL HIGH (ref 70–99)
Potassium: 3.8 mmol/L (ref 3.5–5.1)
Sodium: 139 mmol/L (ref 135–145)

## 2021-04-25 MED ORDER — SENNOSIDES-DOCUSATE SODIUM 8.6-50 MG PO TABS
1.0000 | ORAL_TABLET | Freq: Two times a day (BID) | ORAL | Status: DC
  Administered 2021-04-25 – 2021-04-27 (×5): 1 via ORAL
  Filled 2021-04-25 (×6): qty 1

## 2021-04-25 MED ORDER — ENSURE ENLIVE PO LIQD
237.0000 mL | Freq: Three times a day (TID) | ORAL | Status: DC
  Administered 2021-04-25 – 2021-05-12 (×45): 237 mL via ORAL

## 2021-04-25 MED ORDER — POLYETHYLENE GLYCOL 3350 17 G PO PACK
17.0000 g | PACK | Freq: Every day | ORAL | Status: DC | PRN
  Administered 2021-04-30 (×2): 17 g via ORAL
  Filled 2021-04-25 (×3): qty 1

## 2021-04-25 NOTE — Plan of Care (Signed)

## 2021-04-25 NOTE — Progress Notes (Signed)
PROGRESS NOTE    Christopher Dawson  MPN:[id number] DOB: 16-Feb-1956 DOA: 04/23/2021 PCP: [name], MD   Chief Complaint  Patient presents with   Back Pain   Brief Narrative/Hospital Course: Christopher Dawson, 66 y.o. male with PMH of anxiety/depression, chronic lumbar radiculopathy, tobacco abuse, alcohol abuse and ongoing physical deconditioning for the last 6 months using cane/walker at home presented with intractable back pain and inability to ambulate, or get up to cook his meals or by groceries. He is a longstanding smoker at least for 45 years quit about 8 years ago, started at the age of 78. Complains of constipation no urinary issues. In the ED -CT scan demonstrated what appears to be positive T12 and T9 vertebra process/distraction with concern for metastatic malignant lesions.  Neurosurgery and oncology as discussed CT chest abdomen pelvis and MRI ordered to evaluate further and patient was admitted CT chest showing- "1.8 x 1.6 x 1.0 cm left upper lobe spiculated nodule with pleural stranding, which should be considered a primary neoplasm until proven otherwise. There are 2 slightly prominent left hilar lymph nodes but no further chest adenopathy, left paraspinal tumor extension at the level of vertebral body, 2 cm in T9 vertebral body lytic lesion with adjacent paraspinal soft tissue thickening, 1.5 cm heterogeneous right adrenal nodule.  Subjective: Patient reports pain is better sob no new complaints Afebrile overnight.   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  Limited by neurosurgery no need for intermittent neurosurgical intervention advised IR evaluation T12 and possible vertebroplasty.  Radiation oncology and oncology following-to proceed with diagnostic biopsy of the soft tissue component at T12, continue IV Decadron, palliative radiation to T12 per Dr. Tammi Klippel, pain control, bowel regimen.palliative care following  Tobacco abuse cessation advised, cont nicotine patch Physical deconditioning in the setting of #1 hold off on PT OT for now until back pain is stabilizing   Depression mood is stable, continue his home regimen Alcohol abuse cessation has been counseled watch for withdrawal, CIWA protocol thiamine folate Moderate protein calorie malnutrition with low BMI 18-augment diet as tolerated.  In the setting of malignancy  Leukocytosis likely from steroid.  No fever.  Monitor.  DVT prophylaxis: heparin injection 5,000 Units Start: 04/24/21 2200 Code Status:   Code Status: Partial Code Family Communication: plan of care discussed with patient at bedside. Status is: Inpatient Remains inpatient appropriate because: Intractable back pain and malignancy work-up Disposition: Currently not medically stable for discharge. Anticipated Disposition: TBD  Objective: Vitals last 24 hrs: Vitals:   04/24/21 1915 04/24/21 2059 04/25/21 0526 04/25/21 0840  BP: 122/82 134/86 (!) 135/92 (!) 143/80  Pulse: (!) 105 (!) 103 (!) 102 99  Resp: 18 17 16 16   Temp: 97.6 F (36.4 C) 98.9 F (37.2 C) 98.6 F (37 C) 98 F (36.7 C)  TempSrc: Oral Oral Oral Oral  SpO2: 95% 96% 94% 95%  Weight:      Height:       Weight change:   Intake/Output Summary (Last 24 hours) at 04/25/2021 1031 Last data filed at 04/25/2021 0937 Gross per 24 hour  Intake 2076.94 ml  Output 1425 ml  Net 651.94 ml   Net IO Since Admission: 2,648.98 mL [04/25/21 1031]   Physical Examination: General  exam: AAOx3older than stated age, weak appearing. HEENT:Oral mucosa moist, Ear/Nose WNL grossly, dentition normal. Respiratory system: bilaterally clear,no use of accessory muscle Cardiovascular system: S1 & S2 +, No JVD,. Gastrointestinal system: Abdomen soft, NT,ND, BS+ Nervous System:Alert, awake, moving extremities and grossly nonfocal Extremities: no edema, distal peripheral pulses palpable.  Skin: No rashes,no icterus. MSK: Normal muscle bulk,tone, power .  Medications reviewed:  Scheduled Meds:  clomiPRAMINE  100 mg Oral QHS   dexamethasone (DECADRON) injection  4 mg Intravenous Q6H   feeding supplement  237 mL Oral BID BM   folic acid  1 mg Oral Daily   heparin injection (subcutaneous)  5,000 Units Subcutaneous Q8H   multivitamin with minerals  1 tablet Oral Daily   nicotine  14 mg Transdermal Q24H   pantoprazole  40 mg Oral Daily   senna-docusate  1 tablet Oral BID   thiamine  100 mg Oral Daily   Continuous Infusions:   Diet Order             Diet regular Room service appropriate? Yes; Fluid consistency: Thin  Diet effective now                 Weight change:   Wt Readings from Last 3 Encounters:  04/23/21 67.2 kg  11/25/20 92.3 kg  09/24/19 86.2 kg     Consultants:see note  Procedures:see note Antimicrobials: Anti-infectives (From admission, onward)    None      Culture/Microbiology Other culture-see note  Unresulted Labs (From admission, onward)    None      Data Reviewed: I have personally reviewed following labs and imaging studies CBC: Recent Labs  Lab 04/23/21 1619 04/23/21 2213 04/24/21 0307 04/25/21 0323  WBC 10.6* 8.6 4.5 15.8*  NEUTROABS 7.8*  --   --   --   HGB 15.8 13.9 13.7 13.0  HCT 47.3 42.3 41.7 39.1  MCV 89.6 89.8 90.1 90.9  PLT 376 338 315 993   Basic Metabolic Panel: Recent Labs  Lab 04/23/21 1619 04/23/21 2213 04/24/21 0307 04/25/21 0323  NA 136  --  136 139  K 3.7  --  3.9 3.8  CL 102  --  106 110  CO2  25  --  23 25  GLUCOSE 90  --  179* 165*  BUN 19  --  17 19  CREATININE 0.57* 0.39* 0.48* 0.53*  CALCIUM 9.1  --  9.0 8.8*  MG  --  2.1  --   --   PHOS  --  3.1  --   --    GFR: Estimated Creatinine Clearance: 86.3 mL/min (A) (by C-G formula based on SCr of 0.53 mg/dL (L)). Liver Function Tests: Recent Labs  Lab 04/24/21 0307  AST 13*  ALT 15  ALKPHOS 68  BILITOT 0.7  PROT 6.5  ALBUMIN 3.1*   No results for input(s): LIPASE, AMYLASE in the last 168 hours. No results for input(s): AMMONIA in the last 168 hours. Coagulation Profile: No results for input(s): INR, PROTIME in the last 168 hours. Cardiac Enzymes: No results for input(s): CKTOTAL, CKMB, CKMBINDEX, TROPONINI in the last 168 hours. BNP (last 3 results) No results for input(s): PROBNP in the last 8760 hours. HbA1C: No results for input(s): HGBA1C in the last 72 hours. CBG: No results for input(s): GLUCAP in the last 168 hours.  Lipid Profile: No results for input(s): CHOL, HDL, LDLCALC, TRIG, CHOLHDL, LDLDIRECT in the last 72 hours. Thyroid Function Tests: Recent Labs    04/23/21 2213  TSH 1.861   Anemia Panel: No results for input(s): VITAMINB12, FOLATE, FERRITIN, TIBC, IRON, RETICCTPCT in the last 72 hours. Sepsis Labs: No results for input(s): PROCALCITON, LATICACIDVEN in the last 168 hours.  Recent Results (from the past 240 hour(s))  Resp Panel by RT-PCR (Flu A&B, Covid) Nasopharyngeal Swab     Status: None   Collection Time: 04/23/21  8:05 PM   Specimen: Nasopharyngeal Swab; Nasopharyngeal(NP) swabs in vial transport medium  Result Value Ref Range Status   SARS Coronavirus 2 by RT PCR NEGATIVE NEGATIVE Final    Comment: (NOTE) SARS-CoV-2 target nucleic acids are NOT DETECTED.  The SARS-CoV-2 RNA is generally detectable in upper respiratory specimens during the acute phase of infection. The lowest concentration of SARS-CoV-2 viral copies this assay can detect is 138 copies/mL. A negative result  does not preclude SARS-Cov-2 infection and should not be used as the sole basis for treatment or other patient management decisions. A negative result may occur with  improper specimen collection/handling, submission of specimen other than nasopharyngeal swab, presence of viral mutation(s) within the areas targeted by this assay, and inadequate number of viral copies(<138 copies/mL). A negative result must be combined with clinical observations, patient history, and epidemiological information. The expected result is Negative.  Fact Sheet for Patients:  EntrepreneurPulse.com.au  Fact Sheet for Healthcare Providers:  IncredibleEmployment.be  This test is no t yet approved or cleared by the [place] FDA and  has been authorized for detection and/or diagnosis of SARS-CoV-2 by FDA under an Emergency Use Authorization (EUA). This EUA will remain  in effect (meaning this test can be used) for the duration of the COVID-19 declaration under Section 564(b)(1) of the Act, 21 U.S.C.section 360bbb-3(b)(1), unless the authorization is terminated  or revoked sooner.       Influenza A by PCR NEGATIVE NEGATIVE Final   Influenza B by PCR NEGATIVE NEGATIVE Final    Comment: (NOTE) The Xpert Xpress SARS-CoV-2/FLU/RSV plus assay is intended as an aid in the diagnosis of influenza from Nasopharyngeal swab specimens and should not be used as a sole basis for treatment. Nasal washings and aspirates are unacceptable for Xpert Xpress SARS-CoV-2/FLU/RSV testing.  Fact Sheet for Patients: EntrepreneurPulse.com.au  Fact Sheet for Healthcare Providers: IncredibleEmployment.be  This test is not yet approved or cleared by the [place] FDA and has been authorized for detection and/or diagnosis of SARS-CoV-2 by FDA under an Emergency Use Authorization (EUA). This EUA will remain in effect (meaning this test can be used) for  the duration of the COVID-19 declaration under Section 564(b)(1) of the Act, 21 U.S.C. section 360bbb-3(b)(1), unless the authorization is terminated or revoked.  Performed at [facility], [place] [address], [place],  [address]      Radiology Studies: DG Lumbar Spine Complete  Result Date: 04/23/2021 CLINICAL DATA:  Back pain. EXAM: LUMBAR SPINE - COMPLETE 4+ VIEW COMPARISON:  None. FINDINGS: There is dextroconvex curvature of the lumbar spine. There is no evidence for acute fracture or dislocation of the lumbar spine. There is questionable mild compression deformity of T12, age indeterminate. Mild degenerative endplate osteophytes are seen throughout the lumbar spine. Disc spaces are grossly maintained. Soft tissues are within normal limits. IMPRESSION: 1. Questionable mild compression deformity of T12, age indeterminate. Correlate for point tenderness. Consider dedicated imaging of this level. 2.  Levoconvex curvature of the lumbar spine. 3. Mild degenerative changes. Electronically Signed   By: Ronney Asters M.D.   On: 04/23/2021 16:55   CT Thoracic Spine Wo Contrast  Result Date: 04/23/2021 CLINICAL DATA:  Worsening chronic back pain. EXAM: CT THORACIC AND LUMBAR SPINE WITHOUT CONTRAST TECHNIQUE: Multidetector CT imaging of the thoracic and lumbar spine was performed without contrast. Multiplanar CT image reconstructions were also generated. COMPARISON:  Lumbar spine radiographs 04/23/2021 FINDINGS: CT THORACIC SPINE FINDINGS Alignment: Exaggerated upper thoracic kyphosis.  No listhesis. Vertebrae: Permeative lytic destructive lesion of the vertebral body and left-sided posterior elements at T12 with associated pathologic fracture and mild vertebral body height loss. 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 2 cm lytic lesion in the left lateral aspect of the T9 vertebral body with extraosseous tumor extension. Paraspinal and  other soft tissues: Lower thoracic paravertebral soft tissue suggestive of tumor, possibly with superimposed hematoma, extending from T11-L1. Centrilobular and paraseptal emphysema. Disc levels: Mild-to-moderate thoracic spondylosis. At least mild spinal stenosis is suspected at T12 due to epidural tumor. Tumor also encroaches on the left-sided neural foramina at T11-12 and T12-L1. CT LUMBAR SPINE FINDINGS Segmentation: 5 lumbar type vertebrae. Alignment: Trace retrolisthesis of L4 on L5 and L5 on S1. Slight right convex curvature of the upper lumbar spine. Vertebrae: No acute fracture or suspicious osseous lesion. Paraspinal and other soft tissues: Mild abdominal aortic atherosclerosis. Disc levels: Mild disc degeneration, greatest at L4-5 where disc bulging, spurring, and mild facet hypertrophy result in borderline spinal stenosis and mild right lateral recess and right neural foraminal stenosis. IMPRESSION: 1. Destructive lesion involving the T12 vertebral body and left-sided posterior elements with pathologic vertebral body fracture and epidural tumor resulting in at least mild spinal stenosis. Smaller lesion in the T9 vertebral body. Findings are most concerning for metastatic disease. 2. Aortic Atherosclerosis (ICD10-I70.0) and Emphysema (ICD10-J43.9). Electronically Signed   By: Logan Bores M.D.   On: 04/23/2021 18:41   CT Lumbar Spine Wo Contrast  Result Date: 04/23/2021 CLINICAL DATA:  Worsening chronic back pain. EXAM: CT THORACIC AND LUMBAR SPINE WITHOUT CONTRAST TECHNIQUE: Multidetector CT imaging of the thoracic and lumbar spine was performed without contrast. Multiplanar CT image reconstructions were also generated. COMPARISON:  Lumbar spine radiographs 04/23/2021 FINDINGS: CT THORACIC SPINE FINDINGS Alignment: Exaggerated upper thoracic kyphosis.  No listhesis. Vertebrae: Permeative lytic destructive lesion of the vertebral body and left-sided posterior elements at T12 with associated pathologic  fracture and mild vertebral body height loss. 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 2 cm lytic lesion in the left lateral aspect of the T9 vertebral body with extraosseous tumor extension. Paraspinal and other soft tissues: Lower thoracic paravertebral soft tissue suggestive of tumor, possibly with superimposed hematoma, extending from T11-L1. Centrilobular and paraseptal emphysema. Disc levels: Mild-to-moderate thoracic spondylosis. At least mild spinal stenosis is suspected at T12 due to epidural tumor. Tumor also encroaches on the left-sided neural foramina at T11-12 and T12-L1. CT LUMBAR SPINE FINDINGS Segmentation: 5 lumbar type vertebrae. Alignment: Trace retrolisthesis of L4 on L5 and L5 on S1. Slight right convex curvature of the upper lumbar spine. Vertebrae: No acute fracture or suspicious osseous lesion. Paraspinal and other soft tissues: Mild abdominal aortic atherosclerosis. Disc levels: Mild disc degeneration, greatest at L4-5 where disc bulging, spurring, and mild facet hypertrophy result in borderline spinal stenosis and mild right lateral recess and right neural foraminal stenosis. IMPRESSION: 1. Destructive lesion involving the T12  vertebral body and left-sided posterior elements with pathologic vertebral body fracture and epidural tumor resulting in at least mild spinal stenosis. Smaller lesion in the T9 vertebral body. Findings are most concerning for metastatic disease. 2. Aortic Atherosclerosis (ICD10-I70.0) and Emphysema (ICD10-J43.9). Electronically Signed   By: Logan Bores M.D.   On: 04/23/2021 18:41   MR THORACIC SPINE W WO CONTRAST  Result Date: 04/24/2021 CLINICAL DATA:  Back pain.  Metastatic disease. EXAM: MRI THORACIC AND LUMBAR SPINE WITHOUT AND WITH CONTRAST TECHNIQUE: Multiplanar and multiecho pulse sequences of the thoracic and lumbar spine were obtained without and with intravenous contrast. CONTRAST:  49mL GADAVIST GADOBUTROL 1  MMOL/ML IV SOLN COMPARISON:  CT thoracic and lumbar spine 04/23/2021 FINDINGS: MRI THORACIC SPINE FINDINGS Alignment: Exaggerated upper thoracic kyphosis. No significant listhesis. Vertebrae: Abnormal T1 hypointensity, T2/STIR hyperintensity, and enhancement throughout the T9 vertebral body including a more focal 2 cm lesion in the left aspect of the vertebral body corresponding to the lytic lesion on CT. 2 cm lesion in the posterior aspect of the T5 vertebral body. Subcentimeter enhancing lesion in the right T11 pars. 1 cm enhancing lesion in the base of the T10 spinous process. Subcentimeter enhancing lesion versus vascular enhancement in the left T10 pedicle. Destructive lesion involving the T12 vertebral body and left-sided posterior elements with pathologic fracture as described on CT with extraosseous tumor encroaching upon the left T11-12 and T12-L1 neural foramina and with ventral and left-sided epidural tumor resulting in mild spinal stenosis without cord compression. Mild edema and enhancement along the endplates at T0-3, likely degenerative. Cord:  Normal cord signal. 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 Disc levels: Mild spinal stenosis at T12 due to epidural tumor. Mild thoracic spondylosis elsewhere without significant degenerative stenosis. MRI LUMBAR SPINE FINDINGS Segmentation:  Standard. Alignment: Slight right convex curvature of the lumbar spine. Trace retrolisthesis of L4 on L5 and L5 on S1. Vertebrae: Enhancing lesions in the L1 vertebral body and spinous process without evidence of associated epidural tumor subcentimeter enhancing lesion in the right L5 pedicle. No fracture. Conus medullaris: Extends to the L1-2 level and appears normal. Paraspinal and other soft tissues: Paraspinal tumor at T12 as described above extending to the L1 level. Disc levels: L1-2: Disc desiccation. Mild disc bulging and mild facet hypertrophy without stenosis. L2-3: Minimal disc bulging and  mild-to-moderate facet hypertrophy without stenosis. L3-4: Disc desiccation. Disc bulging, a left foraminal disc osteophyte complex, and mild to moderate facet hypertrophy without significant stenosis. L4-5: Disc desiccation and mild-to-moderate disc space narrowing. Disc bulging, a small central disc protrusion, and mild-to-moderate facet and ligamentum flavum hypertrophy result in borderline to mild bilateral lateral recess and right neural foraminal stenosis without spinal stenosis. L5-S1: Disc desiccation and mild-to-moderate disc space narrowing. Disc bulging, a small left central disc protrusion, and mild facet hypertrophy result in borderline to mild left lateral recess stenosis without spinal or neural foraminal stenosis. IMPRESSION: 1. Multiple enhancing thoracic and lumbar spine lesions consistent with metastases. 2. 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 Electronically Signed   By: Logan Bores M.D.   On: 04/24/2021 13:58   MR Lumbar Spine W Wo Contrast  Result Date: 04/24/2021 CLINICAL DATA:  Back pain.  Metastatic disease. EXAM: MRI THORACIC AND LUMBAR SPINE WITHOUT AND WITH CONTRAST TECHNIQUE: Multiplanar and multiecho pulse sequences of the thoracic and lumbar spine were obtained without and with intravenous contrast. CONTRAST:  67mL GADAVIST GADOBUTROL 1 MMOL/ML IV SOLN COMPARISON:  CT thoracic and lumbar spine 04/23/2021 FINDINGS: MRI THORACIC SPINE FINDINGS Alignment: Exaggerated upper thoracic kyphosis. No significant listhesis. Vertebrae: Abnormal T1 hypointensity, T2/STIR hyperintensity, and enhancement throughout the T9 vertebral body including a more focal 2 cm lesion in the left aspect of the vertebral body corresponding to the lytic lesion on CT. 2 cm lesion in the posterior aspect of the T5 vertebral body. Subcentimeter enhancing lesion in the right T11 pars. 1 cm  enhancing lesion in the base of the T10 spinous process. Subcentimeter enhancing lesion versus vascular enhancement in the left T10 pedicle. Destructive lesion involving the T12 vertebral body and left-sided posterior elements with pathologic fracture as described on CT with extraosseous tumor encroaching upon the left T11-12 and T12-L1 neural foramina and with ventral and left-sided epidural tumor resulting in mild spinal stenosis without cord compression. Mild edema and enhancement along the endplates at A3-5, likely degenerative. Cord:  Normal cord signal. 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 Disc levels: Mild spinal stenosis at T12 due to epidural tumor. Mild thoracic spondylosis elsewhere without significant degenerative stenosis. MRI LUMBAR SPINE FINDINGS Segmentation:  Standard. Alignment: Slight right convex curvature of the lumbar spine. Trace retrolisthesis of L4 on L5 and L5 on S1. Vertebrae: Enhancing lesions in the L1 vertebral body and spinous process without evidence of associated epidural tumor subcentimeter enhancing lesion in the right L5 pedicle. No fracture. Conus medullaris: Extends to the L1-2 level and appears normal. Paraspinal and other soft tissues: Paraspinal tumor at T12 as described above extending to the L1 level. Disc levels: L1-2: Disc desiccation. Mild disc bulging and mild facet hypertrophy without stenosis. L2-3: Minimal disc bulging and mild-to-moderate facet hypertrophy without stenosis. L3-4: Disc desiccation. Disc bulging, a left foraminal disc osteophyte complex, and mild to moderate facet hypertrophy without significant stenosis. L4-5: Disc desiccation and mild-to-moderate disc space narrowing. Disc bulging, a small central disc protrusion, and mild-to-moderate facet and ligamentum flavum hypertrophy result in borderline to mild bilateral lateral recess and right neural foraminal stenosis without spinal stenosis. L5-S1: Disc desiccation and  mild-to-moderate disc space narrowing. Disc bulging, a small left central disc protrusion, and mild facet hypertrophy result in borderline to mild left lateral recess stenosis without spinal or neural foraminal stenosis. IMPRESSION: 1. Multiple enhancing thoracic and lumbar spine lesions consistent with metastases. 2. 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 Electronically Signed   By: Logan Bores M.D.   On: 04/24/2021 13:58   CT CHEST ABDOMEN PELVIS W CONTRAST  Result Date: 04/23/2021 CLINICAL DATA:  Permeative lytic destructive lesion of the T12 vertebral body and left-sided posterior elements with pathologic fracture and mild vertebral height loss, worrisome for metastatic disease. Evaluate for primary. 2 cm lytic lesion left lateral T9 body. EXAM: CT CHEST, ABDOMEN, AND PELVIS WITH CONTRAST TECHNIQUE: Multidetector CT imaging of the chest, abdomen and pelvis was performed following the standard protocol during bolus administration of intravenous contrast. CONTRAST:  29mL OMNIPAQUE IOHEXOL 350 MG/ML SOLN COMPARISON:  CT thoracic and lumbar spine earlier today. FINDINGS: CT CHEST FINDINGS Cardiovascular: The cardiac size is normal. There are scattered calcifications in the LAD coronary artery. Early changes of aortic atherosclerosis. 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 There is no arteriovenous dilatation or central arterial embolus. No pericardial effusion. Mediastinum/Nodes: There are  slightly prominent left hilar lymph nodes with a lymph node measuring 1.2 cm in short axis in the anterior left hilum on series 2 axial 36 and another more posteriorly measuring 1 cm in short axis on axial 41. There is no bulky, encasing or further intrathoracic adenopathy, no mediastinal or axillary mass  or thyroid lesions. There is no tracheal or central airway filling defect or significant esophageal thickening. Minimally small hiatal hernia. Lungs/Pleura: No pleural effusion, thickening or pneumothorax. There is a spiculated mass in the left upper lobe laterally at mid field measuring 1.8 x 1.6 x 1.0 cm, with pleural stranding lateral to it. 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 Bilateral apical pleuroparenchymal scar-like opacities are noted, with bilateral paraseptal emphysematous changes in the lungs predominating in the upper lobes, relatively milder centrilobular changes lung apices. There are patchy hazy opacities in the lingula anteriorly which could be chronic change or pneumonitis. There are scattered linear scar-like opacities in the bases. No other nodules or infiltrates are observed. 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 There is left paraspinal tumor extension at the level of the vertebral body as well. A 2 cm T9 vertebral body lytic lesion is again noted to the left anteriorly, with adjacent paraspinal soft tissue thickening without intracanalicular extension. No other focal bone lesions are seen in the thoracic osseous structures. Asymmetric elevation right hemidiaphragm is also seen. CT ABDOMEN PELVIS FINDINGS Hepatobiliary: Slightly steatotic liver without mass enhancement. The gallbladder and bile ducts unremarkable. Pancreas: Moderately fatty atrophic without mass enhancement or ductal dilatation. Spleen: Unremarkable. Adrenals/Urinary Tract: Indeterminate 1.5 cm heterogeneous right adrenal nodule. There is no left adrenal mass , no focal renal cortical abnormality. There are either are few small scattered nonobstructive  caliceal stones in the kidneys or early contrast excretion mimicking stones. There is no obstructive stone or hydroureteronephrosis no bladder thickening. Stomach/Bowel: The gastric wall is contracted. The unopacified small bowel demonstrating fold thickening in the duodenum and jejunum with suspected fold thickening in the underdistended proximal stomach. There is no small bowel obstruction or inflammation. The appendix is normal. Moderate constipation is seen. Left colonic diverticula without diverticulitis. There is moderate wall thickening versus nondistention in the rectum. Vascular/Lymphatic: Aortic atherosclerosis. No enlarged abdominal or pelvic lymph nodes. Reproductive: Enlarged prostate, 5.5 cm transverse with bladder base impression. Other: There is no free air, hemorrhage or fluid. Small umbilical fat hernia. Musculoskeletal: No regional destructive bone lesions. Moderate left-greater-than-right hip DJD. Degenerative changes and mild dextroscoliosis lumbar spine. IMPRESSION: 1. 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 There are 2 slightly prominent left hilar lymph nodes but no further chest adenopathy. 2. 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 3. Patchy haziness in the anterior lingula which could be chronic change or pneumonitis. 4. Indeterminate heterogeneous 1.5 cm right adrenal mass, possible metastasis. PET-CT may be helpful. 5. COPD and scarring change. 6. 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 No AAA. 7. Moderate thickening of the rectum versus  underdistention. Further evaluation  recommended. 8. Enlarged prostate. 9. Constipation, and additional findings suggesting gastroenteritis. No small bowel obstruction or inflammatory changes. 10. No other CT evidence of metastatic disease. Additional findings discussed above. Electronically Signed   By: Telford Nab M.D.   On: 04/23/2021 21:03     LOS: 2 days   Antonieta Pert, MD [facility]  04/25/2021, 10:31 AM

## 2021-04-25 NOTE — Progress Notes (Signed)
Chief Complaint: Patient was seen in consultation today for T12 lesion biopsy   Referring Physician(s): Dr. Antonieta Pert Dr. Tammi Klippel Dr. Benay Spice  Supervising Physician: Markus Daft  Patient Status: [facility] - In-pt  History of Present Illness: Christopher Dawson is a 66 y.o. male admitted with back pain. His workup has found evidence concerning for metastatic process, including lung lesion with adenopathy. There is also destructive T12 vertebral lesion with extension into the adjacent paraspinous soft tissue. Med Onc and Rad Onc have been consulted. Request for biopsy of this lesion for tissue diagnosis prior to initiation of any radiation or systemic therapy. PMHx, meds, labs, imaging, allergies reviewed. Feels okay. Pain some better.    Past Medical History:  Diagnosis Date   Anxiety    Depression    Lumbar radiculopathy, chronic 2008   Marfan syndrome    Neurosis, depressive    Shortening, leg, congenital     Past Surgical History:  Procedure Laterality Date   LEG SURGERY      Allergies: Codeine  Medications: Prior to Admission medications   Medication Sig Start Date End Date Taking? Authorizing Provider  clomiPRAMINE (ANAFRANIL) 50 MG capsule TAKE 1 CAPSULE(50 MG) BY MOUTH AT BEDTIME Patient taking differently: Take 100 mg by mouth at bedtime. 03/22/21  Yes [name], MD  Multiple Vitamins-Minerals (CENTRUM SILVER 50+MEN) TABS Take 1 tablet by mouth daily with breakfast.   Yes [provider]  clomiPRAMINE (ANAFRANIL) 25 MG capsule May take one to two tablets as needed at bedtime. Patient not taking: Reported on 04/24/2021 12/07/20   [name], MD     Family History  Problem Relation Age of Onset   Heart disease Mother     Social History   Socioeconomic History   Marital status: Single    Spouse name: Not on file   Number of children: Not on file   Years of education: Not on file   Highest education level: Not on file   Occupational History   Not on file  Tobacco Use   Smoking status: Every Day    Types: Cigarettes   Smokeless tobacco: Never  Vaping Use   Vaping Use: Never used  Substance and Sexual Activity   Alcohol use: Yes    Alcohol/week: 1.0 standard drink    Types: 1 Cans of beer per week    Comment: 3-4 beers daily   Drug use: No   Sexual activity: Not Currently  Other Topics Concern   Not on file  Social History Narrative   Not on file   Social Determinants of Health   Financial Resource Strain: Not on file  Food Insecurity: Not on file  Transportation Needs: Not on file  Physical Activity: Not on file  Stress: Not on file  Social Connections: Not on file     Review of Systems: A 12 point ROS discussed and pertinent positives are indicated in the HPI above.  All other systems are negative.  Review of Systems  Vital Signs: BP (!) 143/80 (BP Location: Left Arm)    Pulse 99    Temp 98 F (36.7 C) (Oral)    Resp 16    Ht 6\' 3"  (1.905 m)    Wt 67.2 kg    SpO2 95%    BMI 18.52 kg/m   Physical Exam Constitutional:      Appearance: He is not ill-appearing.  HENT:     Mouth/Throat:     Mouth: Mucous membranes are moist.  Pharynx: Oropharynx is clear.  Cardiovascular:     Rate and Rhythm: Normal rate and regular rhythm.     Heart sounds: Normal heart sounds.  Pulmonary:     Effort: Pulmonary effort is normal. No respiratory distress.     Breath sounds: Normal breath sounds.  Neurological:     General: No focal deficit present.     Mental Status: He is alert and oriented to person, place, and time.  Psychiatric:        Mood and Affect: Mood normal.        Thought Content: Thought content normal.    Imaging: DG Lumbar Spine Complete  Result Date: 04/23/2021 CLINICAL DATA:  Back pain. EXAM: LUMBAR SPINE - COMPLETE 4+ VIEW COMPARISON:  None. FINDINGS: There is dextroconvex curvature of the lumbar spine. There is no evidence for acute fracture or dislocation of the  lumbar spine. There is questionable mild compression deformity of T12, age indeterminate. Mild degenerative endplate osteophytes are seen throughout the lumbar spine. Disc spaces are grossly maintained. Soft tissues are within normal limits. IMPRESSION: 1. Questionable mild compression deformity of T12, age indeterminate. Correlate for point tenderness. Consider dedicated imaging of this level. 2. Levoconvex curvature of the lumbar spine. 3. Mild degenerative changes. Electronically Signed   By: Ronney Asters M.D.   On: 04/23/2021 16:55   CT Thoracic Spine Wo Contrast  Result Date: 04/23/2021 CLINICAL DATA:  Worsening chronic back pain. EXAM: CT THORACIC AND LUMBAR SPINE WITHOUT CONTRAST TECHNIQUE: Multidetector CT imaging of the thoracic and lumbar spine was performed without contrast. Multiplanar CT image reconstructions were also generated. COMPARISON:  Lumbar spine radiographs 04/23/2021 FINDINGS: CT THORACIC SPINE FINDINGS Alignment: Exaggerated upper thoracic kyphosis.  No listhesis. Vertebrae: Permeative lytic destructive lesion of the vertebral body and left-sided posterior elements at T12 with associated pathologic fracture and mild vertebral body height loss. 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 2 cm lytic lesion in the left lateral aspect of the T9 vertebral body with extraosseous tumor extension. Paraspinal and other soft tissues: Lower thoracic paravertebral soft tissue suggestive of tumor, possibly with superimposed hematoma, extending from T11-L1. Centrilobular and paraseptal emphysema. Disc levels: Mild-to-moderate thoracic spondylosis. At least mild spinal stenosis is suspected at T12 due to epidural tumor. Tumor also encroaches on the left-sided neural foramina at T11-12 and T12-L1. CT LUMBAR SPINE FINDINGS Segmentation: 5 lumbar type vertebrae. Alignment: Trace retrolisthesis of L4 on L5 and L5 on S1. Slight right convex curvature of the upper  lumbar spine. Vertebrae: No acute fracture or suspicious osseous lesion. Paraspinal and other soft tissues: Mild abdominal aortic atherosclerosis. Disc levels: Mild disc degeneration, greatest at L4-5 where disc bulging, spurring, and mild facet hypertrophy result in borderline spinal stenosis and mild right lateral recess and right neural foraminal stenosis. IMPRESSION: 1. Destructive lesion involving the T12 vertebral body and left-sided posterior elements with pathologic vertebral body fracture and epidural tumor resulting in at least mild spinal stenosis. Smaller lesion in the T9 vertebral body. Findings are most concerning for metastatic disease. 2. Aortic Atherosclerosis (ICD10-I70.0) and Emphysema (ICD10-J43.9). Electronically Signed   By: Logan Bores M.D.   On: 04/23/2021 18:41   CT Lumbar Spine Wo Contrast  Result Date: 04/23/2021 CLINICAL DATA:  Worsening chronic back pain. EXAM: CT THORACIC AND LUMBAR SPINE WITHOUT CONTRAST TECHNIQUE: Multidetector CT imaging of the thoracic and lumbar spine was performed without contrast. Multiplanar CT image reconstructions were also generated. COMPARISON:  Lumbar spine radiographs 04/23/2021 FINDINGS: CT  THORACIC SPINE FINDINGS Alignment: Exaggerated upper thoracic kyphosis.  No listhesis. Vertebrae: Permeative lytic destructive lesion of the vertebral body and left-sided posterior elements at T12 with associated pathologic fracture and mild vertebral body height loss. 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 2 cm lytic lesion in the left lateral aspect of the T9 vertebral body with extraosseous tumor extension. Paraspinal and other soft tissues: Lower thoracic paravertebral soft tissue suggestive of tumor, possibly with superimposed hematoma, extending from T11-L1. Centrilobular and paraseptal emphysema. Disc levels: Mild-to-moderate thoracic spondylosis. At least mild spinal stenosis is suspected at T12 due to  epidural tumor. Tumor also encroaches on the left-sided neural foramina at T11-12 and T12-L1. CT LUMBAR SPINE FINDINGS Segmentation: 5 lumbar type vertebrae. Alignment: Trace retrolisthesis of L4 on L5 and L5 on S1. Slight right convex curvature of the upper lumbar spine. Vertebrae: No acute fracture or suspicious osseous lesion. Paraspinal and other soft tissues: Mild abdominal aortic atherosclerosis. Disc levels: Mild disc degeneration, greatest at L4-5 where disc bulging, spurring, and mild facet hypertrophy result in borderline spinal stenosis and mild right lateral recess and right neural foraminal stenosis. IMPRESSION: 1. Destructive lesion involving the T12 vertebral body and left-sided posterior elements with pathologic vertebral body fracture and epidural tumor resulting in at least mild spinal stenosis. Smaller lesion in the T9 vertebral body. Findings are most concerning for metastatic disease. 2. Aortic Atherosclerosis (ICD10-I70.0) and Emphysema (ICD10-J43.9). Electronically Signed   By: Logan Bores M.D.   On: 04/23/2021 18:41   MR THORACIC SPINE W WO CONTRAST  Result Date: 04/24/2021 CLINICAL DATA:  Back pain.  Metastatic disease. EXAM: MRI THORACIC AND LUMBAR SPINE WITHOUT AND WITH CONTRAST TECHNIQUE: Multiplanar and multiecho pulse sequences of the thoracic and lumbar spine were obtained without and with intravenous contrast. CONTRAST:  33mL GADAVIST GADOBUTROL 1 MMOL/ML IV SOLN COMPARISON:  CT thoracic and lumbar spine 04/23/2021 FINDINGS: MRI THORACIC SPINE FINDINGS Alignment: Exaggerated upper thoracic kyphosis. No significant listhesis. Vertebrae: Abnormal T1 hypointensity, T2/STIR hyperintensity, and enhancement throughout the T9 vertebral body including a more focal 2 cm lesion in the left aspect of the vertebral body corresponding to the lytic lesion on CT. 2 cm lesion in the posterior aspect of the T5 vertebral body. Subcentimeter enhancing lesion in the right T11 pars. 1 cm enhancing  lesion in the base of the T10 spinous process. Subcentimeter enhancing lesion versus vascular enhancement in the left T10 pedicle. Destructive lesion involving the T12 vertebral body and left-sided posterior elements with pathologic fracture as described on CT with extraosseous tumor encroaching upon the left T11-12 and T12-L1 neural foramina and with ventral and left-sided epidural tumor resulting in mild spinal stenosis without cord compression. Mild edema and enhancement along the endplates at K7-4, likely degenerative. Cord:  Normal cord signal. 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 Disc levels: Mild spinal stenosis at T12 due to epidural tumor. Mild thoracic spondylosis elsewhere without significant degenerative stenosis. MRI LUMBAR SPINE FINDINGS Segmentation:  Standard. Alignment: Slight right convex curvature of the lumbar spine. Trace retrolisthesis of L4 on L5 and L5 on S1. Vertebrae: Enhancing lesions in the L1 vertebral body and spinous process without evidence of associated epidural tumor subcentimeter enhancing lesion in the right L5 pedicle. No fracture. Conus medullaris: Extends to the L1-2 level and appears normal. Paraspinal and other soft tissues: Paraspinal tumor at T12 as described above extending to the L1 level. Disc levels: L1-2: Disc desiccation. Mild disc bulging and mild facet hypertrophy  without stenosis. L2-3: Minimal disc bulging and mild-to-moderate facet hypertrophy without stenosis. L3-4: Disc desiccation. Disc bulging, a left foraminal disc osteophyte complex, and mild to moderate facet hypertrophy without significant stenosis. L4-5: Disc desiccation and mild-to-moderate disc space narrowing. Disc bulging, a small central disc protrusion, and mild-to-moderate facet and ligamentum flavum hypertrophy result in borderline to mild bilateral lateral recess and right neural foraminal stenosis without spinal stenosis. L5-S1: Disc desiccation and mild-to-moderate disc  space narrowing. Disc bulging, a small left central disc protrusion, and mild facet hypertrophy result in borderline to mild left lateral recess stenosis without spinal or neural foraminal stenosis. IMPRESSION: 1. Multiple enhancing thoracic and lumbar spine lesions consistent with metastases. 2. 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 Electronically Signed   By: Logan Bores M.D.   On: 04/24/2021 13:58   MR Lumbar Spine W Wo Contrast  Result Date: 04/24/2021 CLINICAL DATA:  Back pain.  Metastatic disease. EXAM: MRI THORACIC AND LUMBAR SPINE WITHOUT AND WITH CONTRAST TECHNIQUE: Multiplanar and multiecho pulse sequences of the thoracic and lumbar spine were obtained without and with intravenous contrast. CONTRAST:  90mL GADAVIST GADOBUTROL 1 MMOL/ML IV SOLN COMPARISON:  CT thoracic and lumbar spine 04/23/2021 FINDINGS: MRI THORACIC SPINE FINDINGS Alignment: Exaggerated upper thoracic kyphosis. No significant listhesis. Vertebrae: Abnormal T1 hypointensity, T2/STIR hyperintensity, and enhancement throughout the T9 vertebral body including a more focal 2 cm lesion in the left aspect of the vertebral body corresponding to the lytic lesion on CT. 2 cm lesion in the posterior aspect of the T5 vertebral body. Subcentimeter enhancing lesion in the right T11 pars. 1 cm enhancing lesion in the base of the T10 spinous process. Subcentimeter enhancing lesion versus vascular enhancement in the left T10 pedicle. Destructive lesion involving the T12 vertebral body and left-sided posterior elements with pathologic fracture as described on CT with extraosseous tumor encroaching upon the left T11-12 and T12-L1 neural foramina and with ventral and left-sided epidural tumor resulting in mild spinal stenosis without cord compression. Mild edema and enhancement along the endplates at Z3-6, likely degenerative.  Cord:  Normal cord signal. 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 Disc levels: Mild spinal stenosis at T12 due to epidural tumor. Mild thoracic spondylosis elsewhere without significant degenerative stenosis. MRI LUMBAR SPINE FINDINGS Segmentation:  Standard. Alignment: Slight right convex curvature of the lumbar spine. Trace retrolisthesis of L4 on L5 and L5 on S1. Vertebrae: Enhancing lesions in the L1 vertebral body and spinous process without evidence of associated epidural tumor subcentimeter enhancing lesion in the right L5 pedicle. No fracture. Conus medullaris: Extends to the L1-2 level and appears normal. Paraspinal and other soft tissues: Paraspinal tumor at T12 as described above extending to the L1 level. Disc levels: L1-2: Disc desiccation. Mild disc bulging and mild facet hypertrophy without stenosis. L2-3: Minimal disc bulging and mild-to-moderate facet hypertrophy without stenosis. L3-4: Disc desiccation. Disc bulging, a left foraminal disc osteophyte complex, and mild to moderate facet hypertrophy without significant stenosis. L4-5: Disc desiccation and mild-to-moderate disc space narrowing. Disc bulging, a small central disc protrusion, and mild-to-moderate facet and ligamentum flavum hypertrophy result in borderline to mild bilateral lateral recess and right neural foraminal stenosis without spinal stenosis. L5-S1: Disc desiccation and mild-to-moderate disc space narrowing. Disc bulging, a small left central disc protrusion, and mild facet hypertrophy result in borderline to mild left lateral recess stenosis without spinal or neural foraminal stenosis. IMPRESSION:  1. Multiple enhancing thoracic and lumbar spine lesions consistent with metastases. 2. 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 Electronically Signed   By: Logan Bores M.D.   On: 04/24/2021  13:58   CT CHEST ABDOMEN PELVIS W CONTRAST  Result Date: 04/23/2021 CLINICAL DATA:  Permeative lytic destructive lesion of the T12 vertebral body and left-sided posterior elements with pathologic fracture and mild vertebral height loss, worrisome for metastatic disease. Evaluate for primary. 2 cm lytic lesion left lateral T9 body. EXAM: CT CHEST, ABDOMEN, AND PELVIS WITH CONTRAST TECHNIQUE: Multidetector CT imaging of the chest, abdomen and pelvis was performed following the standard protocol during bolus administration of intravenous contrast. CONTRAST:  37mL OMNIPAQUE IOHEXOL 350 MG/ML SOLN COMPARISON:  CT thoracic and lumbar spine earlier today. FINDINGS: CT CHEST FINDINGS Cardiovascular: The cardiac size is normal. There are scattered calcifications in the LAD coronary artery. Early changes of aortic atherosclerosis. 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 There is no arteriovenous dilatation or central arterial embolus. No pericardial effusion. Mediastinum/Nodes: There are slightly prominent left hilar lymph nodes with a lymph node measuring 1.2 cm in short axis in the anterior left hilum on series 2 axial 36 and another more posteriorly measuring 1 cm in short axis on axial 41. There is no bulky, encasing or further intrathoracic adenopathy, no mediastinal or axillary mass or thyroid lesions. There is no tracheal or central airway filling defect or significant esophageal thickening. Minimally small hiatal hernia. Lungs/Pleura: No pleural effusion, thickening or pneumothorax. There is a spiculated mass in the left upper lobe laterally at mid field measuring 1.8 x 1.6 x 1.0 cm, with pleural stranding lateral to it. 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 Bilateral apical pleuroparenchymal scar-like opacities are noted, with bilateral paraseptal emphysematous changes in the lungs predominating in the upper lobes,  relatively milder centrilobular changes lung apices. There are patchy hazy opacities in the lingula anteriorly which could be chronic change or pneumonitis. There are scattered linear scar-like opacities in the bases. No other nodules or infiltrates are observed. 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 There is left paraspinal tumor extension at the level of the vertebral body as well. A 2 cm T9 vertebral body lytic lesion is again noted to the left anteriorly, with adjacent paraspinal soft tissue thickening without intracanalicular extension. No other focal bone lesions are seen in the thoracic osseous structures. Asymmetric elevation right hemidiaphragm is also seen. CT ABDOMEN PELVIS FINDINGS Hepatobiliary: Slightly steatotic liver without mass enhancement. The gallbladder and bile ducts unremarkable. Pancreas: Moderately fatty atrophic without mass enhancement or ductal dilatation. Spleen: Unremarkable. Adrenals/Urinary Tract: Indeterminate 1.5 cm heterogeneous right adrenal nodule. There is no left adrenal mass , no focal renal cortical abnormality. There are either are few small scattered nonobstructive caliceal stones in the kidneys or early contrast excretion mimicking stones. There is no obstructive stone or hydroureteronephrosis no bladder thickening. Stomach/Bowel: The gastric wall is contracted. The unopacified small bowel demonstrating fold thickening in the duodenum and jejunum with suspected fold thickening in the underdistended proximal stomach. There is no small bowel obstruction or inflammation. The appendix is normal. Moderate  constipation is seen. Left colonic diverticula without diverticulitis. There is moderate wall thickening  versus nondistention in the rectum. Vascular/Lymphatic: Aortic atherosclerosis. No enlarged abdominal or pelvic lymph nodes. Reproductive: Enlarged prostate, 5.5 cm transverse with bladder base impression. Other: There is no free air, hemorrhage or fluid. Small umbilical fat hernia. Musculoskeletal: No regional destructive bone lesions. Moderate left-greater-than-right hip DJD. Degenerative changes and mild dextroscoliosis lumbar spine. IMPRESSION: 1. 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 There are 2 slightly prominent left hilar lymph nodes but no further chest adenopathy. 2. 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 3. Patchy haziness in the anterior lingula which could be chronic change or pneumonitis. 4. Indeterminate heterogeneous 1.5 cm right adrenal mass, possible metastasis. PET-CT may be helpful. 5. COPD and scarring change. 6. 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 No AAA. 7. Moderate thickening of the rectum versus underdistention. Further evaluation recommended. 8. Enlarged prostate. 9. Constipation, and additional findings suggesting gastroenteritis. No small bowel obstruction or inflammatory changes. 10. No other CT evidence of metastatic disease. Additional findings discussed above. Electronically Signed   By: Telford Nab M.D.   On: 04/23/2021 21:03    Labs:  CBC: Recent Labs    04/23/21 1619 04/23/21 2213 04/24/21 0307 04/25/21 0323  WBC 10.6* 8.6 4.5 15.8*  HGB 15.8 13.9 13.7 13.0  HCT 47.3 42.3 41.7 39.1  PLT 376 338 315 339    COAGS: No results for input(s): INR, APTT in  the last 8760 hours.  BMP: Recent Labs    04/23/21 1619 04/23/21 2213 04/24/21 0307 04/25/21 0323  NA 136  --  136 139  K 3.7  --  3.9 3.8  CL 102  --  106 110  CO2 25  --  23 25  GLUCOSE 90  --  179* 165*  BUN 19  --  17 19  CALCIUM 9.1  --  9.0 8.8*  CREATININE 0.57* 0.39* 0.48* 0.53*  GFRNONAA >60 >60 >60 >60    LIVER FUNCTION TESTS: Recent Labs    04/24/21 0307  BILITOT 0.7  AST 13*  ALT 15  ALKPHOS 68  PROT 6.5  ALBUMIN 3.1*    TUMOR MARKERS: No results for input(s): AFPTM, CEA, CA199, CHROMGRNA in the last 8760 hours.  Assessment and Plan: T12 vertebral and paraspinous mass lesion suspicious for metastasis Imaging reviewed with Dr. Jake Seats for CT guided biopsy tomorrow. NPO p MN Risks and benefits of biopsy was discussed with the patient and/or patient's family including, but not limited to bleeding, infection, damage to adjacent structures or low yield requiring additional tests.  All of the questions were answered and there is agreement to proceed.  Consent signed and in chart.   Thank you for this interesting consult.  I greatly enjoyed meeting Christopher Dawson and look forward to participating in their care.  A copy of this report was sent to the requesting provider on this date.  Electronically Signed: Ascencion Dike, PA-C 04/25/2021, 11:14 AM   I spent a total of 20 minutes in face to face in clinical consultation, greater than 50% of which was counseling/coordinating care for biopsy of paraspinous mass lesion

## 2021-04-25 NOTE — Progress Notes (Signed)
Initial Nutrition Assessment  DOCUMENTATION CODES:   Underweight  INTERVENTION:  - continue Ensure Enlive but will increase from BID to TID, each supplement provides 350 kcal and 20 grams of protein. - complete NFPE when feasible.    NUTRITION DIAGNOSIS:   Increased nutrient needs related to acute illness as evidenced by estimated needs.  GOAL:   Patient will meet greater than or equal to 90% of their needs  MONITOR:   PO intake, Supplement acceptance, Labs, Weight trends  REASON FOR ASSESSMENT:   Malnutrition Screening Tool  ASSESSMENT:   66 y.o. male with medical history of anxiety, depression, chronic lumbar radiculopathy, tobacco abuse, alcohol abuse, and ongoing physical deconditioning for the last 6 months using cane/walker at home. He presented to the ED due to intractable back pain and inability to ambulate which has led to inability to cook meals or grocery shop. He reported constipation and issues with urination. In the ED, CT showed T12 and T9 fractures with concern for metastatic malignant lesions. CT chest showed LUL nodule with pleural stranding which is being considered a primary neoplasm.  Patient ate 100% at all meals yesterday (total of 2101 kcal and 92 grams protein) and 100% of breakfast this AM (305 kcal and 21 grams protein.  Ensure Enlive was ordered BID on 12/31 and he has accepted all bottles offered since that time.    Weight on 12/31 was 148 lb and weight weight on 11/25/20 was 203 lb. This indicates 55 lb weight loss (27% body weight) in the past 5 months; significant for time frame.  When RD entered the room patient indicated needing help after using bedpan to have a BM. Patient was also on the phone at that time. RD called Tech to alert him that patient was requesting assistance.  Per Radiology PA note this AM, cleared for CT-guided biopsy on 1/3.    Labs reviewed; K, Mg, and Phos WDL, creatinine: 0.53 mg/dl, Ca: 8.8 mg/dl.  Medications  reviewed; 1 mg folvite/day, 1 tablet multivitamin with minerals/day, 40 mg oral protonix/day, 1 tablet senokot BID, 100 mg oral thiamine/day.     NUTRITION - FOCUSED PHYSICAL EXAM:  Unable to complete due to patient's need for assistance from nurse/tech.  Diet Order:   Diet Order             Diet NPO time specified Except for: Sips with Meds  Diet effective midnight           Diet regular Room service appropriate? Yes; Fluid consistency: Thin  Diet effective now                   EDUCATION NEEDS:   No education needs have been identified at this time  Skin:  Skin Assessment: Reviewed RN Assessment  Last BM:  1/2  Height:   Ht Readings from Last 1 Encounters:  04/23/21 6\' 3"  (1.905 m)    Weight:   Wt Readings from Last 1 Encounters:  04/23/21 67.2 kg    Estimated Nutritional Needs:  Kcal:  2350-2550 kcal Protein:  125-140 grams Fluid:  >/= 2.5 L/day      Jarome Matin, MS, RD, LDN, CNSC Inpatient Clinical Dietitian RD pager # available in [facility]  After hours/weekend pager # available in [facility]

## 2021-04-25 NOTE — Progress Notes (Addendum)
IP PROGRESS NOTE  Subjective:   Christopher Dawson reports partial improvement in back pain.  No new complaint.  No difficulty with bladder control.  Christopher Dawson is constipated.  Objective: Vital signs in last 24 hours: Blood pressure (!) 135/92, pulse (!) 102, temperature 98.6 F (37 C), temperature source Oral, resp. rate 16, height 6\' 3"  (1.905 m), weight 148 lb 2.4 oz (67.2 kg), SpO2 94 %.  Intake/Output from previous day: 01/01 0701 - 01/02 0700 In: 2076.9 [P.O.:1680; I.V.:396.9] Out: 1525 [Urine:1525]  Physical Exam:  HEENT: No thrush Abdomen: Soft and nontender, no hepatosplenomegaly Extremities: No leg edema Neurologic: The motor exam appears intact in the legs and feet bilaterally   Lab Results: Recent Labs    04/24/21 0307 04/25/21 0323  WBC 4.5 15.8*  HGB 13.7 13.0  HCT 41.7 39.1  PLT 315 339    BMET Recent Labs    04/24/21 0307 04/25/21 0323  NA 136 139  K 3.9 3.8  CL 106 110  CO2 23 25  GLUCOSE 179* 165*  BUN 17 19  CREATININE 0.48* 0.53*  CALCIUM 9.0 8.8*    No results found for: CEA1, CEA, [id number], CA125  Studies/Results: DG Lumbar Spine Complete  Result Date: 04/23/2021 CLINICAL DATA:  Back pain. EXAM: LUMBAR SPINE - COMPLETE 4+ VIEW COMPARISON:  None. FINDINGS: There is dextroconvex curvature of the lumbar spine. There is no evidence for acute fracture or dislocation of the lumbar spine. There is questionable mild compression deformity of T12, age indeterminate. Mild degenerative endplate osteophytes are seen throughout the lumbar spine. Disc spaces are grossly maintained. Soft tissues are within normal limits. IMPRESSION: 1. Questionable mild compression deformity of T12, age indeterminate. Correlate for point tenderness. Consider dedicated imaging of this level. 2. Levoconvex curvature of the lumbar spine. 3. Mild degenerative changes. Electronically Signed   By: Ronney Asters M.D.   On: 04/23/2021 16:55   CT Thoracic Spine Wo Contrast  Result Date:  04/23/2021 CLINICAL DATA:  Worsening chronic back pain. EXAM: CT THORACIC AND LUMBAR SPINE WITHOUT CONTRAST TECHNIQUE: Multidetector CT imaging of the thoracic and lumbar spine was performed without contrast. Multiplanar CT image reconstructions were also generated. COMPARISON:  Lumbar spine radiographs 04/23/2021 FINDINGS: CT THORACIC SPINE FINDINGS Alignment: Exaggerated upper thoracic kyphosis.  No listhesis. Vertebrae: Permeative lytic destructive lesion of the vertebral body and left-sided posterior elements at T12 with associated pathologic fracture and mild vertebral body height loss. 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 2 cm lytic lesion in the left lateral aspect of the T9 vertebral body with extraosseous tumor extension. Paraspinal and other soft tissues: Lower thoracic paravertebral soft tissue suggestive of tumor, possibly with superimposed hematoma, extending from T11-L1. Centrilobular and paraseptal emphysema. Disc levels: Mild-to-moderate thoracic spondylosis. At least mild spinal stenosis is suspected at T12 due to epidural tumor. Tumor also encroaches on the left-sided neural foramina at T11-12 and T12-L1. CT LUMBAR SPINE FINDINGS Segmentation: 5 lumbar type vertebrae. Alignment: Trace retrolisthesis of L4 on L5 and L5 on S1. Slight right convex curvature of the upper lumbar spine. Vertebrae: No acute fracture or suspicious osseous lesion. Paraspinal and other soft tissues: Mild abdominal aortic atherosclerosis. Disc levels: Mild disc degeneration, greatest at L4-5 where disc bulging, spurring, and mild facet hypertrophy result in borderline spinal stenosis and mild right lateral recess and right neural foraminal stenosis. IMPRESSION: 1. Destructive lesion involving the T12 vertebral body and left-sided posterior elements with pathologic vertebral body fracture and epidural tumor resulting in at  least mild spinal stenosis. Smaller lesion in the T9  vertebral body. Findings are most concerning for metastatic disease. 2. Aortic Atherosclerosis (ICD10-I70.0) and Emphysema (ICD10-J43.9). Electronically Signed   By: Logan Bores M.D.   On: 04/23/2021 18:41   CT Lumbar Spine Wo Contrast  Result Date: 04/23/2021 CLINICAL DATA:  Worsening chronic back pain. EXAM: CT THORACIC AND LUMBAR SPINE WITHOUT CONTRAST TECHNIQUE: Multidetector CT imaging of the thoracic and lumbar spine was performed without contrast. Multiplanar CT image reconstructions were also generated. COMPARISON:  Lumbar spine radiographs 04/23/2021 FINDINGS: CT THORACIC SPINE FINDINGS Alignment: Exaggerated upper thoracic kyphosis.  No listhesis. Vertebrae: Permeative lytic destructive lesion of the vertebral body and left-sided posterior elements at T12 with associated pathologic fracture and mild vertebral body height loss. 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 2 cm lytic lesion in the left lateral aspect of the T9 vertebral body with extraosseous tumor extension. Paraspinal and other soft tissues: Lower thoracic paravertebral soft tissue suggestive of tumor, possibly with superimposed hematoma, extending from T11-L1. Centrilobular and paraseptal emphysema. Disc levels: Mild-to-moderate thoracic spondylosis. At least mild spinal stenosis is suspected at T12 due to epidural tumor. Tumor also encroaches on the left-sided neural foramina at T11-12 and T12-L1. CT LUMBAR SPINE FINDINGS Segmentation: 5 lumbar type vertebrae. Alignment: Trace retrolisthesis of L4 on L5 and L5 on S1. Slight right convex curvature of the upper lumbar spine. Vertebrae: No acute fracture or suspicious osseous lesion. Paraspinal and other soft tissues: Mild abdominal aortic atherosclerosis. Disc levels: Mild disc degeneration, greatest at L4-5 where disc bulging, spurring, and mild facet hypertrophy result in borderline spinal stenosis and mild right lateral recess and right  neural foraminal stenosis. IMPRESSION: 1. Destructive lesion involving the T12 vertebral body and left-sided posterior elements with pathologic vertebral body fracture and epidural tumor resulting in at least mild spinal stenosis. Smaller lesion in the T9 vertebral body. Findings are most concerning for metastatic disease. 2. Aortic Atherosclerosis (ICD10-I70.0) and Emphysema (ICD10-J43.9). Electronically Signed   By: Logan Bores M.D.   On: 04/23/2021 18:41   MR THORACIC SPINE W WO CONTRAST  Result Date: 04/24/2021 CLINICAL DATA:  Back pain.  Metastatic disease. EXAM: MRI THORACIC AND LUMBAR SPINE WITHOUT AND WITH CONTRAST TECHNIQUE: Multiplanar and multiecho pulse sequences of the thoracic and lumbar spine were obtained without and with intravenous contrast. CONTRAST:  33mL GADAVIST GADOBUTROL 1 MMOL/ML IV SOLN COMPARISON:  CT thoracic and lumbar spine 04/23/2021 FINDINGS: MRI THORACIC SPINE FINDINGS Alignment: Exaggerated upper thoracic kyphosis. No significant listhesis. Vertebrae: Abnormal T1 hypointensity, T2/STIR hyperintensity, and enhancement throughout the T9 vertebral body including a more focal 2 cm lesion in the left aspect of the vertebral body corresponding to the lytic lesion on CT. 2 cm lesion in the posterior aspect of the T5 vertebral body. Subcentimeter enhancing lesion in the right T11 pars. 1 cm enhancing lesion in the base of the T10 spinous process. Subcentimeter enhancing lesion versus vascular enhancement in the left T10 pedicle. Destructive lesion involving the T12 vertebral body and left-sided posterior elements with pathologic fracture as described on CT with extraosseous tumor encroaching upon the left T11-12 and T12-L1 neural foramina and with ventral and left-sided epidural tumor resulting in mild spinal stenosis without cord compression. Mild edema and enhancement along the endplates at C9-4, likely degenerative. Cord:  Normal cord signal. Paraspinal and other soft tissues:  Paraspinal tumor at T12. Disc levels: Mild spinal stenosis at T12 due to epidural tumor. Mild thoracic spondylosis elsewhere without  significant degenerative stenosis. MRI LUMBAR SPINE FINDINGS Segmentation:  Standard. Alignment: Slight right convex curvature of the lumbar spine. Trace retrolisthesis of L4 on L5 and L5 on S1. Vertebrae: Enhancing lesions in the L1 vertebral body and spinous process without evidence of associated epidural tumor subcentimeter enhancing lesion in the right L5 pedicle. No fracture. Conus medullaris: Extends to the L1-2 level and appears normal. Paraspinal and other soft tissues: Paraspinal tumor at T12 as described above extending to the L1 level. Disc levels: L1-2: Disc desiccation. Mild disc bulging and mild facet hypertrophy without stenosis. L2-3: Minimal disc bulging and mild-to-moderate facet hypertrophy without stenosis. L3-4: Disc desiccation. Disc bulging, a left foraminal disc osteophyte complex, and mild to moderate facet hypertrophy without significant stenosis. L4-5: Disc desiccation and mild-to-moderate disc space narrowing. Disc bulging, a small central disc protrusion, and mild-to-moderate facet and ligamentum flavum hypertrophy result in borderline to mild bilateral lateral recess and right neural foraminal stenosis without spinal stenosis. L5-S1: Disc desiccation and mild-to-moderate disc space narrowing. Disc bulging, a small left central disc protrusion, and mild facet hypertrophy result in borderline to mild left lateral recess stenosis without spinal or neural foraminal stenosis. IMPRESSION: 1. Multiple enhancing thoracic and lumbar spine lesions consistent with metastases. 2. 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 Electronically Signed   By: Logan Bores M.D.   On: 04/24/2021 13:58   MR Lumbar Spine W Wo Contrast  Result Date:  04/24/2021 CLINICAL DATA:  Back pain.  Metastatic disease. EXAM: MRI THORACIC AND LUMBAR SPINE WITHOUT AND WITH CONTRAST TECHNIQUE: Multiplanar and multiecho pulse sequences of the thoracic and lumbar spine were obtained without and with intravenous contrast. CONTRAST:  95mL GADAVIST GADOBUTROL 1 MMOL/ML IV SOLN COMPARISON:  CT thoracic and lumbar spine 04/23/2021 FINDINGS: MRI THORACIC SPINE FINDINGS Alignment: Exaggerated upper thoracic kyphosis. No significant listhesis. Vertebrae: Abnormal T1 hypointensity, T2/STIR hyperintensity, and enhancement throughout the T9 vertebral body including a more focal 2 cm lesion in the left aspect of the vertebral body corresponding to the lytic lesion on CT. 2 cm lesion in the posterior aspect of the T5 vertebral body. Subcentimeter enhancing lesion in the right T11 pars. 1 cm enhancing lesion in the base of the T10 spinous process. Subcentimeter enhancing lesion versus vascular enhancement in the left T10 pedicle. Destructive lesion involving the T12 vertebral body and left-sided posterior elements with pathologic fracture as described on CT with extraosseous tumor encroaching upon the left T11-12 and T12-L1 neural foramina and with ventral and left-sided epidural tumor resulting in mild spinal stenosis without cord compression. Mild edema and enhancement along the endplates at J6-2, likely degenerative. Cord:  Normal cord signal. 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 Disc levels: Mild spinal stenosis at T12 due to epidural tumor. Mild thoracic spondylosis elsewhere without significant degenerative stenosis. MRI LUMBAR SPINE FINDINGS Segmentation:  Standard. Alignment: Slight right convex curvature of the lumbar spine. Trace retrolisthesis of L4 on L5 and L5 on S1. Vertebrae: Enhancing lesions in the L1 vertebral body and spinous process without evidence of associated epidural tumor subcentimeter enhancing lesion in the right L5 pedicle. No fracture. Conus  medullaris: Extends to the L1-2 level and appears normal. Paraspinal and other soft tissues: Paraspinal tumor at T12 as described above extending to the L1 level. Disc levels: L1-2: Disc desiccation. Mild disc bulging and mild facet hypertrophy without stenosis. L2-3: Minimal disc bulging and mild-to-moderate facet hypertrophy without  stenosis. L3-4: Disc desiccation. Disc bulging, a left foraminal disc osteophyte complex, and mild to moderate facet hypertrophy without significant stenosis. L4-5: Disc desiccation and mild-to-moderate disc space narrowing. Disc bulging, a small central disc protrusion, and mild-to-moderate facet and ligamentum flavum hypertrophy result in borderline to mild bilateral lateral recess and right neural foraminal stenosis without spinal stenosis. L5-S1: Disc desiccation and mild-to-moderate disc space narrowing. Disc bulging, a small left central disc protrusion, and mild facet hypertrophy result in borderline to mild left lateral recess stenosis without spinal or neural foraminal stenosis. IMPRESSION: 1. Multiple enhancing thoracic and lumbar spine lesions consistent with metastases. 2. 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 Electronically Signed   By: Logan Bores M.D.   On: 04/24/2021 13:58   CT CHEST ABDOMEN PELVIS W CONTRAST  Result Date: 04/23/2021 CLINICAL DATA:  Permeative lytic destructive lesion of the T12 vertebral body and left-sided posterior elements with pathologic fracture and mild vertebral height loss, worrisome for metastatic disease. Evaluate for primary. 2 cm lytic lesion left lateral T9 body. EXAM: CT CHEST, ABDOMEN, AND PELVIS WITH CONTRAST TECHNIQUE: Multidetector CT imaging of the chest, abdomen and pelvis was performed following the standard protocol during bolus administration of intravenous contrast. CONTRAST:  14mL OMNIPAQUE  IOHEXOL 350 MG/ML SOLN COMPARISON:  CT thoracic and lumbar spine earlier today. FINDINGS: CT CHEST FINDINGS Cardiovascular: The cardiac size is normal. There are scattered calcifications in the LAD coronary artery. Early changes of aortic atherosclerosis. 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 There is no arteriovenous dilatation or central arterial embolus. No pericardial effusion. Mediastinum/Nodes: There are slightly prominent left hilar lymph nodes with a lymph node measuring 1.2 cm in short axis in the anterior left hilum on series 2 axial 36 and another more posteriorly measuring 1 cm in short axis on axial 41. There is no bulky, encasing or further intrathoracic adenopathy, no mediastinal or axillary mass or thyroid lesions. There is no tracheal or central airway filling defect or significant esophageal thickening. Minimally small hiatal hernia. Lungs/Pleura: No pleural effusion, thickening or pneumothorax. There is a spiculated mass in the left upper lobe laterally at mid field measuring 1.8 x 1.6 x 1.0 cm, with pleural stranding lateral to it. 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 Bilateral apical pleuroparenchymal scar-like opacities are noted, with bilateral paraseptal emphysematous changes in the lungs predominating in the upper lobes, relatively milder centrilobular changes lung apices. There are patchy hazy opacities in the lingula anteriorly which could be chronic change or pneumonitis. There are scattered linear scar-like opacities in the bases. No other nodules or infiltrates are observed. 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 There is  left paraspinal tumor extension at the level of the vertebral body as well. A 2 cm T9 vertebral body lytic lesion is again noted to the left anteriorly, with adjacent paraspinal soft tissue thickening without intracanalicular extension. No other focal bone lesions are seen in the thoracic osseous structures. Asymmetric elevation right hemidiaphragm is also seen. CT ABDOMEN PELVIS FINDINGS Hepatobiliary: Slightly steatotic liver without mass enhancement. The gallbladder and bile ducts unremarkable. Pancreas: Moderately fatty atrophic without mass enhancement  or ductal dilatation. Spleen: Unremarkable. Adrenals/Urinary Tract: Indeterminate 1.5 cm heterogeneous right adrenal nodule. There is no left adrenal mass , no focal renal cortical abnormality. There are either are few small scattered nonobstructive caliceal stones in the kidneys or early contrast excretion mimicking stones. There is no obstructive stone or hydroureteronephrosis no bladder thickening. Stomach/Bowel: The gastric wall is contracted. The unopacified small bowel demonstrating fold thickening in the duodenum and jejunum with suspected fold thickening in the underdistended proximal stomach. There is no small bowel obstruction or inflammation. The appendix is normal. Moderate constipation is seen. Left colonic diverticula without diverticulitis. There is moderate wall thickening versus nondistention in the rectum. Vascular/Lymphatic: Aortic atherosclerosis. No enlarged abdominal or pelvic lymph nodes. Reproductive: Enlarged prostate, 5.5 cm transverse with bladder base impression. Other: There is no free air, hemorrhage or fluid. Small umbilical fat hernia. Musculoskeletal: No regional destructive bone lesions. Moderate left-greater-than-right hip DJD. Degenerative changes and mild dextroscoliosis lumbar spine. IMPRESSION: 1. 1.8 x 1.6 x 1.0 cm left upper lobe spiculated nodule with pleural stranding, which should be considered a primary neoplasm until  proven otherwise. There are 2 slightly prominent left hilar lymph nodes but no further chest adenopathy. 2. 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 3. Patchy haziness in the anterior lingula which could be chronic change or pneumonitis. 4. Indeterminate heterogeneous 1.5 cm right adrenal mass, possible metastasis. PET-CT may be helpful. 5. COPD and scarring change. 6. 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 No AAA. 7. Moderate thickening of the rectum versus underdistention. Further evaluation recommended. 8. Enlarged prostate. 9. Constipation, and additional findings suggesting gastroenteritis. No small bowel obstruction or inflammatory changes. 10. No other CT evidence of metastatic disease. Additional findings discussed above. Electronically Signed   By: Telford Nab M.D.   On: 04/23/2021 21:03    Medications: I have reviewed the patient's current medications.  Assessment/Plan:  Left lung mass, small left hilar lymph nodes Destructive lesion involving T12 with epidural extension, T9 lytic lesion Right adrenal mass Pain secondary to #2 Leg weakness secondary to #2 and deconditioning Marfan syndrome Depression Tobacco and alcohol use  Christopher Dawson appears stable.  The spine MRI confirmed multiple bone metastases and a destructive lesion involving T12 with a pathologic fracture.  There is extraosseous tumor encroaching on the left neuroforamina and the ventral left-sided epidural tumor with mild spinal stenosis.  No cord compression. Dr. Annette Stable does not recommend surgery.  Christopher Dawson most likely has metastatic lung cancer.  Recommendations: Proceed with diagnostic biopsy of the soft tissue component at  T12 in order to obtain tissue for surgery pathology and molecular testing Continue Decadron Narcotic analgesics as needed for pain Palliative radiation to T12 per Dr. Tammi Klippel Bowel regimen  LOS: 2 days   Betsy Coder, MD   04/25/2021, 7:58 AM

## 2021-04-26 ENCOUNTER — Inpatient Hospital Stay (HOSPITAL_COMMUNITY): Payer: Medicare Other

## 2021-04-26 ENCOUNTER — Ambulatory Visit
Admit: 2021-04-26 | Discharge: 2021-04-26 | Disposition: A | Discharge status: Home / Self Care | Payer: Medicare Other | Attending: Radiation Oncology | Admitting: Radiation Oncology

## 2021-04-26 DIAGNOSIS — C7951 Secondary malignant neoplasm of bone: Secondary | ICD-10-CM | POA: Insufficient documentation

## 2021-04-26 DIAGNOSIS — R918 Other nonspecific abnormal finding of lung field: Secondary | ICD-10-CM | POA: Diagnosis not present

## 2021-04-26 DIAGNOSIS — M549 Dorsalgia, unspecified: Secondary | ICD-10-CM

## 2021-04-26 DIAGNOSIS — E279 Disorder of adrenal gland, unspecified: Secondary | ICD-10-CM | POA: Diagnosis not present

## 2021-04-26 DIAGNOSIS — M899 Disorder of bone, unspecified: Secondary | ICD-10-CM | POA: Diagnosis not present

## 2021-04-26 DIAGNOSIS — M546 Pain in thoracic spine: Secondary | ICD-10-CM | POA: Diagnosis not present

## 2021-04-26 IMAGING — CT CT BIOPSY
1 of 5 series · 8 of 32 positions shown, 13 images · non-contrast
Comparison: none

INDICATION: 65-year-old with left lung lesion and bone lesions. Findings are
concerning for metastatic disease and needs a tissue diagnosis.

[Series 2: i-spiral 5.0 bf37 · axial · 0.89mm/px · z∈[+26,+113]mm · 8 of 33 slices shown, 13 images]
[im 4/33  soft-tissue]
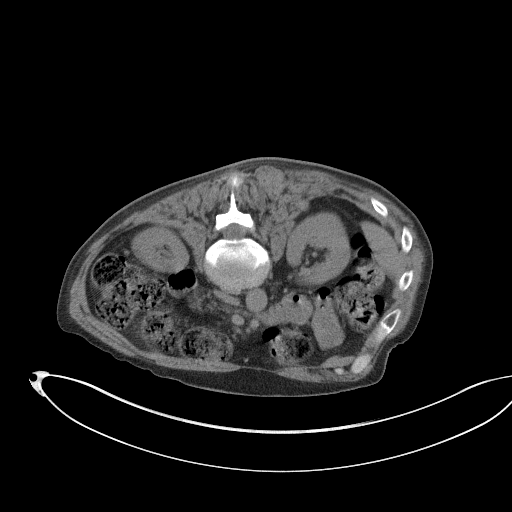
[im 4/33  bone]
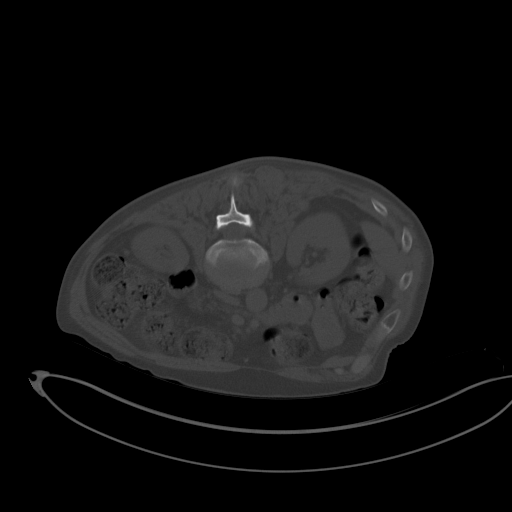
[im 8/33  soft-tissue]
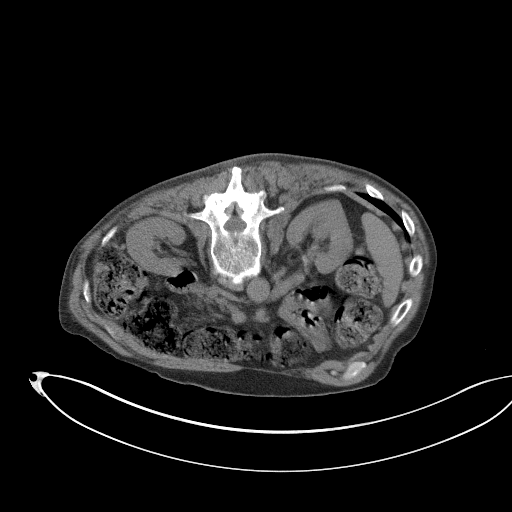
[im 11/33  soft-tissue]
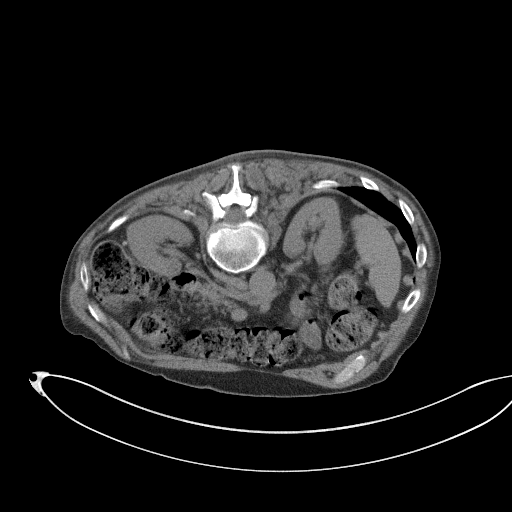
[im 15/33  soft-tissue]
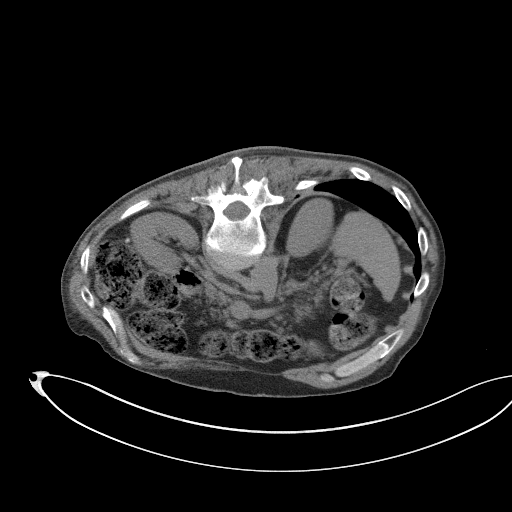
[im 18/33  soft-tissue]
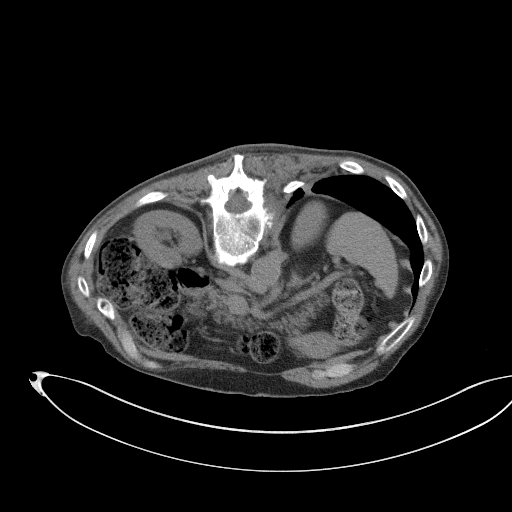
[im 18/33  lung]
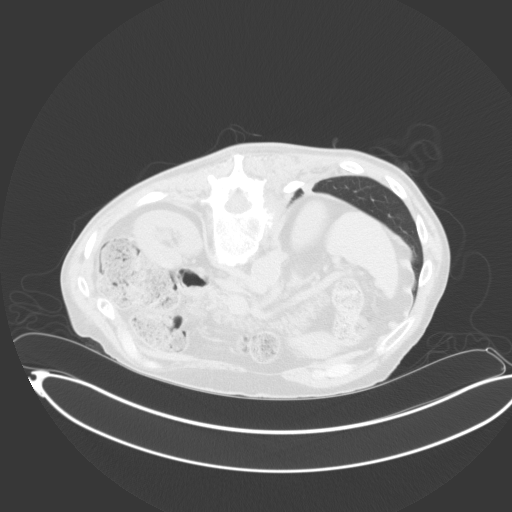
[im 22/33  soft-tissue]
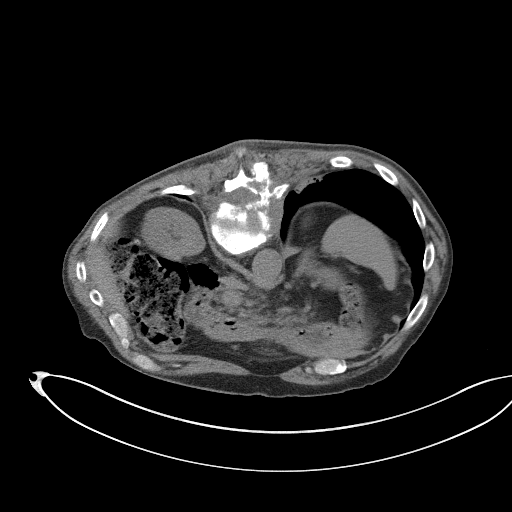
[im 22/33  lung]
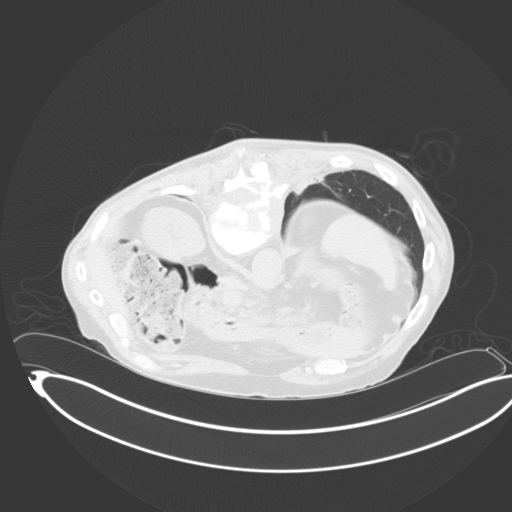
[im 25/33  soft-tissue]
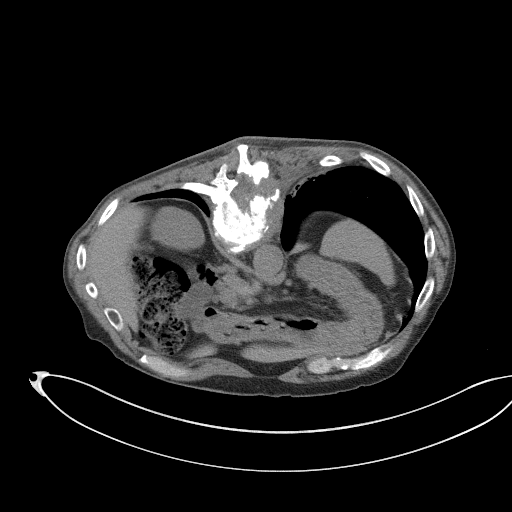
[im 25/33  lung]
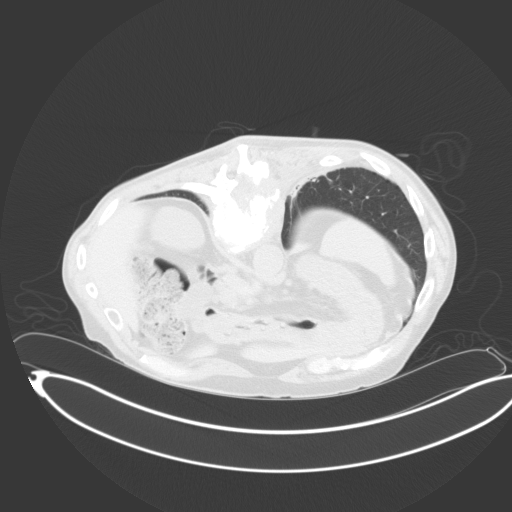
[im 29/33  soft-tissue]
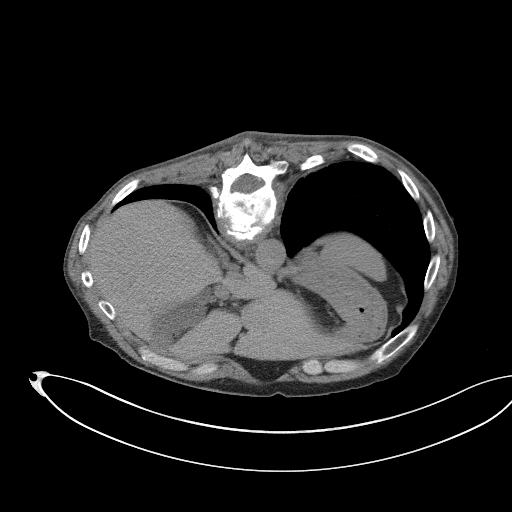
[im 29/33  lung]
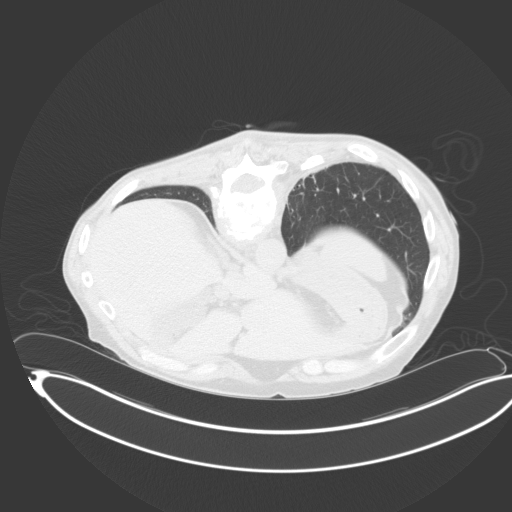

[8 of 32 positions shown; findings below may reference images not displayed]

EXAM:
CT-GUIDED BIOPSY OF PARASPINAL LESION

MEDICATIONS:
Moderate sedation

ANESTHESIA/SEDATION:
Moderate (conscious) sedation was employed during this procedure. A
total of Versed 2.0mg and fentanyl 100 mcg was administered
intravenously at the order of the provider performing the procedure.

Total intra-service moderate sedation time: 28 minutes.

Patient's level of consciousness and vital signs were monitored
continuously by radiology nurse throughout the procedure under the
supervision of the provider performing the procedure.

COMPLICATIONS:
None immediate.

PROCEDURE:
The procedure was explained to the patient. The risks and benefits
of the procedure were discussed and the patient's questions were
addressed. Informed consent was obtained from the patient. The
patient was placed prone on CT table. Images at the thoracolumbar
spine were obtained. The left paraspinal soft tissue at T12-L1 was
identified and targeted. The overlying skin was prepped with
chlorhexidine and sterile field was created. Skin was anesthetized
using 1% lidocaine. Small incision was made. Using CT guidance, 17
gauge coaxial needle was directed along the lateral aspect of the
left paraspinal tissue. A single core biopsy obtained with an 18
gauge device. Specimen placed in formalin. There was brisk bleeding
from the coaxial needle following the single core biopsy. Stylet was
replaced within the coaxial needle and checked for bleeding
periodically by removing the stylet. There continued to be some
bleeding after a few minutes. Needle was slowly withdrawn until the
bleeding appeared to stop. There was concern for a small hematoma
developing under the skin. As a result, the needle was completely
removed and manual compression was placed for approximately 5
minutes. Follow up CT images were obtained. Bandage placed over the
puncture site.
FINDINGS: Destructive lesion involving the T12 vertebral body with abnormal
soft tissue along the left paraspinal region at T12-L1. The biopsy
needle was placed along the lateral aspect of this abnormal
paraspinal tissue. Single core biopsy was obtained. Brisk bleeding
from the 17 gauge coaxial needle following the core biopsy. Due to
the bleeding and concern for a small soft tissue hematoma, no
additional core biopsies were obtained.
IMPRESSION: CT-guided core biopsy of the left paraspinal lesion at the level of
T12-L1.

## 2021-04-26 MED ORDER — FENTANYL CITRATE (PF) 100 MCG/2ML IJ SOLN
INTRAMUSCULAR | Status: AC
  Filled 2021-04-26: qty 4

## 2021-04-26 MED ORDER — FENTANYL CITRATE (PF) 100 MCG/2ML IJ SOLN
INTRAMUSCULAR | Status: AC | PRN
  Administered 2021-04-26: 50 ug via INTRAVENOUS

## 2021-04-26 MED ORDER — MIDAZOLAM HCL 2 MG/2ML IJ SOLN
INTRAMUSCULAR | Status: AC | PRN
Start: 2021-04-26 — End: 2021-04-26
  Administered 2021-04-26: 1 mg via INTRAVENOUS

## 2021-04-26 MED ORDER — MIDAZOLAM HCL 2 MG/2ML IJ SOLN
INTRAMUSCULAR | Status: AC
  Filled 2021-04-26: qty 4

## 2021-04-26 MED ORDER — SODIUM CHLORIDE 0.9 % IV SOLN
INTRAVENOUS | Status: AC
  Filled 2021-04-26: qty 250

## 2021-04-26 MED ORDER — LIDOCAINE HCL (PF) 1 % IJ SOLN
INTRAMUSCULAR | Status: AC | PRN
  Administered 2021-04-26: 10 mL via INTRADERMAL

## 2021-04-26 MED ORDER — FENTANYL CITRATE (PF) 100 MCG/2ML IJ SOLN
INTRAMUSCULAR | Status: AC | PRN
  Administered 2021-04-26 (×2): 25 ug via INTRAVENOUS

## 2021-04-26 NOTE — Progress Notes (Addendum)
IP PROGRESS NOTE  Subjective:   Back pain improved overall.  However, he still has pain intermittently.  Bowels are moving.  He has no difficulty with bladder control.  Objective: Vital signs in last 24 hours: Blood pressure (!) 142/88, pulse (!) 105, temperature 97.9 F (36.6 C), temperature source Oral, resp. rate 19, height 6\' 3"  (1.905 m), weight 67.2 kg, SpO2 100 %.  Intake/Output from previous day: 01/02 0701 - 01/03 0700 In: 360 [P.O.:360] Out: 1410 [Urine:1410]  Physical Exam:  HEENT: No thrush Abdomen: Soft and nontender, no hepatosplenomegaly Extremities: No leg edema Neurologic: The motor exam appears intact in the legs and feet bilaterally   Lab Results: Recent Labs    04/24/21 0307 04/25/21 0323  WBC 4.5 15.8*  HGB 13.7 13.0  HCT 41.7 39.1  PLT 315 339    BMET Recent Labs    04/24/21 0307 04/25/21 0323  NA 136 139  K 3.9 3.8  CL 106 110  CO2 23 25  GLUCOSE 179* 165*  BUN 17 19  CREATININE 0.48* 0.53*  CALCIUM 9.0 8.8*    No results found for: CEA1, CEA, [id number], CA125  Studies/Results: MR THORACIC SPINE W WO CONTRAST  Result Date: 04/24/2021 CLINICAL DATA:  Back pain.  Metastatic disease. EXAM: MRI THORACIC AND LUMBAR SPINE WITHOUT AND WITH CONTRAST TECHNIQUE: Multiplanar and multiecho pulse sequences of the thoracic and lumbar spine were obtained without and with intravenous contrast. CONTRAST:  87mL GADAVIST GADOBUTROL 1 MMOL/ML IV SOLN COMPARISON:  CT thoracic and lumbar spine 04/23/2021 FINDINGS: MRI THORACIC SPINE FINDINGS Alignment: Exaggerated upper thoracic kyphosis. No significant listhesis. Vertebrae: Abnormal T1 hypointensity, T2/STIR hyperintensity, and enhancement throughout the T9 vertebral body including a more focal 2 cm lesion in the left aspect of the vertebral body corresponding to the lytic lesion on CT. 2 cm lesion in the posterior aspect of the T5 vertebral body. Subcentimeter enhancing lesion in the right T11 pars. 1 cm enhancing  lesion in the base of the T10 spinous process. Subcentimeter enhancing lesion versus vascular enhancement in the left T10 pedicle. Destructive lesion involving the T12 vertebral body and left-sided posterior elements with pathologic fracture as described on CT with extraosseous tumor encroaching upon the left T11-12 and T12-L1 neural foramina and with ventral and left-sided epidural tumor resulting in mild spinal stenosis without cord compression. Mild edema and enhancement along the endplates at Y1-9, likely degenerative. Cord:  Normal cord signal. 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 Disc levels: Mild spinal stenosis at T12 due to epidural tumor. Mild thoracic spondylosis elsewhere without significant degenerative stenosis. MRI LUMBAR SPINE FINDINGS Segmentation:  Standard. Alignment: Slight right convex curvature of the lumbar spine. Trace retrolisthesis of L4 on L5 and L5 on S1. Vertebrae: Enhancing lesions in the L1 vertebral body and spinous process without evidence of associated epidural tumor subcentimeter enhancing lesion in the right L5 pedicle. No fracture. Conus medullaris: Extends to the L1-2 level and appears normal. Paraspinal and other soft tissues: Paraspinal tumor at T12 as described above extending to the L1 level. Disc levels: L1-2: Disc desiccation. Mild disc bulging and mild facet hypertrophy without stenosis. L2-3: Minimal disc bulging and mild-to-moderate facet hypertrophy without stenosis. L3-4: Disc desiccation. Disc bulging, a left foraminal disc osteophyte complex, and mild to moderate facet hypertrophy without significant stenosis. L4-5: Disc desiccation and mild-to-moderate disc space narrowing. Disc bulging, a small central disc protrusion, and mild-to-moderate facet and ligamentum flavum hypertrophy result in borderline to mild bilateral lateral recess and right  neural foraminal stenosis without spinal stenosis. L5-S1: Disc desiccation and mild-to-moderate disc  space narrowing. Disc bulging, a small left central disc protrusion, and mild facet hypertrophy result in borderline to mild left lateral recess stenosis without spinal or neural foraminal stenosis. IMPRESSION: 1. Multiple enhancing thoracic and lumbar spine lesions consistent with metastases. 2. 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 Electronically Signed   By: Logan Bores M.D.   On: 04/24/2021 13:58   MR Lumbar Spine W Wo Contrast  Result Date: 04/24/2021 CLINICAL DATA:  Back pain.  Metastatic disease. EXAM: MRI THORACIC AND LUMBAR SPINE WITHOUT AND WITH CONTRAST TECHNIQUE: Multiplanar and multiecho pulse sequences of the thoracic and lumbar spine were obtained without and with intravenous contrast. CONTRAST:  26mL GADAVIST GADOBUTROL 1 MMOL/ML IV SOLN COMPARISON:  CT thoracic and lumbar spine 04/23/2021 FINDINGS: MRI THORACIC SPINE FINDINGS Alignment: Exaggerated upper thoracic kyphosis. No significant listhesis. Vertebrae: Abnormal T1 hypointensity, T2/STIR hyperintensity, and enhancement throughout the T9 vertebral body including a more focal 2 cm lesion in the left aspect of the vertebral body corresponding to the lytic lesion on CT. 2 cm lesion in the posterior aspect of the T5 vertebral body. Subcentimeter enhancing lesion in the right T11 pars. 1 cm enhancing lesion in the base of the T10 spinous process. Subcentimeter enhancing lesion versus vascular enhancement in the left T10 pedicle. Destructive lesion involving the T12 vertebral body and left-sided posterior elements with pathologic fracture as described on CT with extraosseous tumor encroaching upon the left T11-12 and T12-L1 neural foramina and with ventral and left-sided epidural tumor resulting in mild spinal stenosis without cord compression. Mild edema and enhancement along the endplates at J5-0, likely degenerative.  Cord:  Normal cord signal. 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 Disc levels: Mild spinal stenosis at T12 due to epidural tumor. Mild thoracic spondylosis elsewhere without significant degenerative stenosis. MRI LUMBAR SPINE FINDINGS Segmentation:  Standard. Alignment: Slight right convex curvature of the lumbar spine. Trace retrolisthesis of L4 on L5 and L5 on S1. Vertebrae: Enhancing lesions in the L1 vertebral body and spinous process without evidence of associated epidural tumor subcentimeter enhancing lesion in the right L5 pedicle. No fracture. Conus medullaris: Extends to the L1-2 level and appears normal. Paraspinal and other soft tissues: Paraspinal tumor at T12 as described above extending to the L1 level. Disc levels: L1-2: Disc desiccation. Mild disc bulging and mild facet hypertrophy without stenosis. L2-3: Minimal disc bulging and mild-to-moderate facet hypertrophy without stenosis. L3-4: Disc desiccation. Disc bulging, a left foraminal disc osteophyte complex, and mild to moderate facet hypertrophy without significant stenosis. L4-5: Disc desiccation and mild-to-moderate disc space narrowing. Disc bulging, a small central disc protrusion, and mild-to-moderate facet and ligamentum flavum hypertrophy result in borderline to mild bilateral lateral recess and right neural foraminal stenosis without spinal stenosis. L5-S1: Disc desiccation and mild-to-moderate disc space narrowing. Disc bulging, a small left central disc protrusion, and mild facet hypertrophy result in borderline to mild left lateral recess stenosis without spinal or neural foraminal stenosis. IMPRESSION: 1. Multiple enhancing thoracic and lumbar spine lesions consistent with metastases. 2. 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 Electronically Signed   By: Logan Bores M.D.   On: 04/24/2021  13:58    Medications: I have reviewed the patient's current medications.  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  Awaiting biopsy of the T12 lesion today.  Clinically and radiographically, he does not have evidence of cord compression.  He has been seen by radiation oncology who is planning for radiation to T12. 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  We will discuss systemic treatment options with the patient once biopsy results are available.  Pain is adequately controlled at this time we will continue current pain medications.  Continue bowel regimen.  Recommendations: Proceed with diagnostic biopsy of the soft tissue component at T12 in order to obtain tissue for surgery pathology and molecular testing Continue Decadron, can convert to oral Decadron prior to discharge Continue current pain medication Palliative radiation to T9 and T12 per Dr. Tammi Klippel Bowel regimen PT/OT consult   LOS: 3 days   Mikey Bussing, NP   04/26/2021, 7:59 AM Christopher Dawson was interviewed and examined.  He reports improvement in pain and leg strength since hospital admission.  Oxycodone helps the pain.  He is scheduled undergo a biopsy of the T12 mass today.  The plan is to begin palliative radiation if a malignant diagnosis is confirmed.  We contacted pathology to request a rush on the pathology review.  I was present for greater than 50% of today's visit.  I performed medical decision making.

## 2021-04-26 NOTE — Plan of Care (Signed)

## 2021-04-26 NOTE — Progress Notes (Signed)
PT Cancellation Note  Patient Details Name: Christopher Dawson MRN: [id number] DOB: 03-Jul-1955   Cancelled Treatment:    Reason Eval/Treat Not Completed: Patient had biopsy today.Check back tomorrow. Tresa Endo PT Acute Rehabilitation Services Pager [phone] Office [phone]-   Claretha Cooper 04/26/2021, 2:15 PM

## 2021-04-26 NOTE — Procedures (Signed)
Interventional Radiology Procedure:   Indications: Left lung lesion with bone lesions.  Needs tissue diagnosis.  Destructive lesion at T12 with soft tissue component.  Procedure: CT guided biopsy of left paraspinal lesion at T12-L1  Findings: Biopsy obtained from lateral aspect of left paraspinal soft tissue at T12-L1.  Pulsatile bleeding from needle after first biopsy and developed small hematoma just below skin.  Needle was removed and manual compression at puncture site for 5 minutes.  Hematoma resolved with manual compression.   Complications: No immediate complications noted.     EBL: Minimal  Plan: Bedrest 2 hours.  Will discuss with oncology about checking biopsy adequacy prior to treatment.    Onyekachi Gathright R. Anselm Pancoast, MD  Pager: [phone]

## 2021-04-26 NOTE — Progress Notes (Signed)
Radiation Oncology         [phone]) [phone] ________________________________  Initial Inpatient Consultation  Name: Christopher Dawson MRN: [id number]  Date of Service: 04/23/2021 DOB: Apr 06, 1956  JO:INOMVE, Mortimer Fries, MD  No ref. provider found   REFERRING PHYSICIAN: Julieanne Manson, MD  DIAGNOSIS: 66 yo gentleman with painful T12 spinal metastasis    ICD-10-CM   1. Chronic midline thoracic back pain  M54.6    G89.29     2. Tumor  D49.9     3. Weakness  R53.1     4. Intractable back pain  M54.9 oxyCODONE (Oxy IR/ROXICODONE) immediate release tablet 5 mg    senna-docusate (Senokot-S) tablet 1 tablet    polyethylene glycol (MIRALAX / GLYCOLAX) packet 17 g    5. Paraspinal mass  R22.2 CT BIOPSY    CT BIOPSY      HISTORY OF PRESENT ILLNESS: Christopher Dawson is a 66 y.o. male seen at the request of Dr. Benay Spice.  He presented to the emergency room yesterday with a 3 week history of increased back pain.  A plain x-ray of the lumbar spine revealed a questionable compression deformity at T12.  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  A 2 cm lytic lesion was noted in the left lateral aspect of T9.  Paravertebral soft tissue suggestive tumor was noted at T11-L1 with a possible superimposed hematoma.  No suspicious lesions in the lumbar spine.   CTs of the chest, abdomen, and pelvis revealed a 1.8 cm spiculated left upper lobe nodule, a 2 cm lytic lesion at T9, a lytic destructive lesion at T12 with epidural extension.  A 1.5 cm indeterminate right adrenal mass was noted.  Changes of COPD.  Enlarged prostate.  Constipation.  Haziness in the lingula.  2 slightly prominent left hilar nodes.  He was admitted and placed on Decadron.  He reports no difficulty with bowel or bladder control.  The pain is slightly improved compared to admission  yesterday.  He reports his current leg weakness has been present for 3 weeks.   An MRI of the thoracic and lumbar spine on 04/24/21 confirmed multiple enhancing thoracic and lumbar spine lesions consistent with metastases.  The destructive lesion involving the T12 vertebral body and left-sided posterior elements with pathologic fracture and epidural tumor resulting in mild spinal stenosis, without radiographic cord compression.  The patient's case was reviewed by neurosurgery and Dr. Annette Stable does not recommend surgery.    PREVIOUS RADIATION THERAPY: No  PAST MEDICAL HISTORY:  Past Medical History:  Diagnosis Date   Anxiety    Depression    Lumbar radiculopathy, chronic 2008   Marfan syndrome    Neurosis, depressive    Shortening, leg, congenital       PAST SURGICAL HISTORY: Past Surgical History:  Procedure Laterality Date   LEG SURGERY      FAMILY HISTORY:  Family History  Problem Relation Age of Onset   Heart disease Mother     SOCIAL HISTORY:  Social History   Socioeconomic History   Marital status: Single    Spouse name: Not on file   Number of children: Not on file   Years of education: Not on file   Highest education level: Not on file  Occupational History   Not on file  Tobacco Use   Smoking status: Every Day    Types: Cigarettes  Smokeless tobacco: Never  Vaping Use   Vaping Use: Never used  Substance and Sexual Activity   Alcohol use: Yes    Alcohol/week: 1.0 standard drink    Types: 1 Cans of beer per week    Comment: 3-4 beers daily   Drug use: No   Sexual activity: Not Currently  Other Topics Concern   Not on file  Social History Narrative   Not on file   Social Determinants of Health   Financial Resource Strain: Not on file  Food Insecurity: Not on file  Transportation Needs: Not on file  Physical Activity: Not on file  Stress: Not on file  Social Connections: Not on file  Intimate Partner Violence: Not on file    ALLERGIES:  Codeine  MEDICATIONS:  Current Facility-Administered Medications  Medication Dose Route Frequency Provider Last Rate Last Admin   acetaminophen (TYLENOL) tablet 650 mg  650 mg Oral Q6H PRN Barton Dubois, MD   650 mg at 04/24/21 0049   Or   acetaminophen (TYLENOL) suppository 650 mg  650 mg Rectal Q6H PRN Barton Dubois, MD       clomiPRAMINE (ANAFRANIL) capsule 100 mg  100 mg Oral QHS Barton Dubois, MD   100 mg at 04/25/21 2314   dexamethasone (DECADRON) injection 4 mg  4 mg Intravenous Q6H Barton Dubois, MD   4 mg at 04/26/21 0606   feeding supplement (ENSURE ENLIVE / ENSURE PLUS) liquid 237 mL  237 mL Oral TID BM Kc, Maren Beach, MD   237 mL at 58/85/02 7741   folic acid (FOLVITE) tablet 1 mg  1 mg Oral Daily Barton Dubois, MD   1 mg at 04/25/21 0841   heparin injection 5,000 Units  5,000 Units Subcutaneous Q8H Kc, Maren Beach, MD   5,000 Units at 04/25/21 1307   HYDROmorphone (DILAUDID) injection 0.5 mg  0.5 mg Intravenous Q4H PRN Kc, Maren Beach, MD   0.5 mg at 04/24/21 1607   LORazepam (ATIVAN) tablet 1-4 mg  1-4 mg Oral Q1H PRN Barton Dubois, MD       Or   LORazepam (ATIVAN) injection 1-4 mg  1-4 mg Intravenous Q1H PRN Barton Dubois, MD       multivitamin with minerals tablet 1 tablet  1 tablet Oral Daily Barton Dubois, MD   1 tablet at 04/25/21 2878   nicotine (NICODERM CQ - dosed in mg/24 hours) patch 14 mg  14 mg Transdermal Q24H Barton Dubois, MD   14 mg at 04/25/21 2318   ondansetron (ZOFRAN) tablet 4 mg  4 mg Oral Q6H PRN Barton Dubois, MD       Or   ondansetron [facility]) injection 4 mg  4 mg Intravenous Q6H PRN Barton Dubois, MD       oxyCODONE (Oxy IR/ROXICODONE) immediate release tablet 5 mg  5 mg Oral Q6H PRN Ladell Pier, MD   5 mg at 04/26/21 0605   pantoprazole (PROTONIX) EC tablet 40 mg  40 mg Oral Daily Barton Dubois, MD   40 mg at 04/25/21 0842   polyethylene glycol (MIRALAX / GLYCOLAX) packet 17 g  17 g Oral Daily PRN Ladell Pier, MD       senna-docusate  (Senokot-S) tablet 1 tablet  1 tablet Oral BID Ladell Pier, MD   1 tablet at 04/25/21 0843   thiamine tablet 100 mg  100 mg Oral Daily Barton Dubois, MD   100 mg at 04/25/21 6767    REVIEW OF SYSTEMS:  On review of systems, the  patient reports that he is doing well overall. He denies any chest pain, shortness of breath, cough, fevers, chills, night sweats, unintended weight changes. He denies any bowel or bladder disturbances, and denies abdominal pain, nausea or vomiting. A complete review of systems is obtained and is otherwise negative other than the back pain noted in the HPI    PHYSICAL EXAM:  Wt Readings from Last 3 Encounters:  04/23/21 148 lb 2.4 oz (67.2 kg)  11/25/20 203 lb 6.4 oz (92.3 kg)  09/24/19 190 lb (86.2 kg)   Temp Readings from Last 3 Encounters:  04/26/21 97.9 F (36.6 C) (Oral)  11/25/20 (!) 97.2 F (36.2 C) (Temporal)  06/30/19 98.7 F (37.1 C) (Oral)   BP Readings from Last 3 Encounters:  04/26/21 (!) 142/88  11/25/20 (!) 146/88  09/24/19 138/76   Pulse Readings from Last 3 Encounters:  04/26/21 (!) 105  11/25/20 (!) 107  06/30/19 (!) 111   Pain Assessment Pain Score: 5 /10  In general this is a well appearing man in no acute distress. Per med-onc HEENT: No thrush Abdomen: Soft and nontender, no hepatosplenomegaly Extremities: No leg edema Neurologic: The motor exam appears intact in the legs and feet bilaterally   KPS = 70  100 - Normal; no complaints; no evidence of disease. 90   - Able to carry on normal activity; minor signs or symptoms of disease. 80   - Normal activity with effort; some signs or symptoms of disease. 46   - Cares for self; unable to carry on normal activity or to do active work. 60   - Requires occasional assistance, but is able to care for most of his personal needs. 50   - Requires considerable assistance and frequent medical care. 87   - Disabled; requires special care and assistance. 56   - Severely disabled;  hospital admission is indicated although death not imminent. 22   - Very sick; hospital admission necessary; active supportive treatment necessary. 10   - Moribund; fatal processes progressing rapidly. 0     - Dead  Karnofsky DA, Abelmann Morris, Craver LS and Burchenal JH [phone]) The use of the nitrogen mustards in the palliative treatment of carcinoma: with particular reference to bronchogenic carcinoma Cancer 1 634-56  LABORATORY DATA:  Lab Results  Component Value Date   WBC 15.8 (H) 04/25/2021   HGB 13.0 04/25/2021   HCT 39.1 04/25/2021   MCV 90.9 04/25/2021   PLT 339 04/25/2021   Lab Results  Component Value Date   NA 139 04/25/2021   K 3.8 04/25/2021   CL 110 04/25/2021   CO2 25 04/25/2021   Lab Results  Component Value Date   ALT 15 04/24/2021   AST 13 (L) 04/24/2021   ALKPHOS 68 04/24/2021   BILITOT 0.7 04/24/2021     RADIOGRAPHY: DG Lumbar Spine Complete  Result Date: 04/23/2021 CLINICAL DATA:  Back pain. EXAM: LUMBAR SPINE - COMPLETE 4+ VIEW COMPARISON:  None. FINDINGS: There is dextroconvex curvature of the lumbar spine. There is no evidence for acute fracture or dislocation of the lumbar spine. There is questionable mild compression deformity of T12, age indeterminate. Mild degenerative endplate osteophytes are seen throughout the lumbar spine. Disc spaces are grossly maintained. Soft tissues are within normal limits. IMPRESSION: 1. Questionable mild compression deformity of T12, age indeterminate. Correlate for point tenderness. Consider dedicated imaging of this level. 2. Levoconvex curvature of the lumbar spine. 3. Mild degenerative changes. Electronically Signed   By: Ronney Asters  M.D.   On: 04/23/2021 16:55   CT Thoracic Spine Wo Contrast  Result Date: 04/23/2021 CLINICAL DATA:  Worsening chronic back pain. EXAM: CT THORACIC AND LUMBAR SPINE WITHOUT CONTRAST TECHNIQUE: Multidetector CT imaging of the thoracic and lumbar spine was performed without contrast.  Multiplanar CT image reconstructions were also generated. COMPARISON:  Lumbar spine radiographs 04/23/2021 FINDINGS: CT THORACIC SPINE FINDINGS Alignment: Exaggerated upper thoracic kyphosis.  No listhesis. Vertebrae: Permeative lytic destructive lesion of the vertebral body and left-sided posterior elements at T12 with associated pathologic fracture and mild vertebral body height loss. 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 2 cm lytic lesion in the left lateral aspect of the T9 vertebral body with extraosseous tumor extension. Paraspinal and other soft tissues: Lower thoracic paravertebral soft tissue suggestive of tumor, possibly with superimposed hematoma, extending from T11-L1. Centrilobular and paraseptal emphysema. Disc levels: Mild-to-moderate thoracic spondylosis. At least mild spinal stenosis is suspected at T12 due to epidural tumor. Tumor also encroaches on the left-sided neural foramina at T11-12 and T12-L1. CT LUMBAR SPINE FINDINGS Segmentation: 5 lumbar type vertebrae. Alignment: Trace retrolisthesis of L4 on L5 and L5 on S1. Slight right convex curvature of the upper lumbar spine. Vertebrae: No acute fracture or suspicious osseous lesion. Paraspinal and other soft tissues: Mild abdominal aortic atherosclerosis. Disc levels: Mild disc degeneration, greatest at L4-5 where disc bulging, spurring, and mild facet hypertrophy result in borderline spinal stenosis and mild right lateral recess and right neural foraminal stenosis. IMPRESSION: 1. Destructive lesion involving the T12 vertebral body and left-sided posterior elements with pathologic vertebral body fracture and epidural tumor resulting in at least mild spinal stenosis. Smaller lesion in the T9 vertebral body. Findings are most concerning for metastatic disease. 2. Aortic Atherosclerosis (ICD10-I70.0) and Emphysema (ICD10-J43.9). Electronically Signed   By: Logan Bores M.D.   On: 04/23/2021 18:41    CT Lumbar Spine Wo Contrast  Result Date: 04/23/2021 CLINICAL DATA:  Worsening chronic back pain. EXAM: CT THORACIC AND LUMBAR SPINE WITHOUT CONTRAST TECHNIQUE: Multidetector CT imaging of the thoracic and lumbar spine was performed without contrast. Multiplanar CT image reconstructions were also generated. COMPARISON:  Lumbar spine radiographs 04/23/2021 FINDINGS: CT THORACIC SPINE FINDINGS Alignment: Exaggerated upper thoracic kyphosis.  No listhesis. Vertebrae: Permeative lytic destructive lesion of the vertebral body and left-sided posterior elements at T12 with associated pathologic fracture and mild vertebral body height loss. 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 2 cm lytic lesion in the left lateral aspect of the T9 vertebral body with extraosseous tumor extension. Paraspinal and other soft tissues: Lower thoracic paravertebral soft tissue suggestive of tumor, possibly with superimposed hematoma, extending from T11-L1. Centrilobular and paraseptal emphysema. Disc levels: Mild-to-moderate thoracic spondylosis. At least mild spinal stenosis is suspected at T12 due to epidural tumor. Tumor also encroaches on the left-sided neural foramina at T11-12 and T12-L1. CT LUMBAR SPINE FINDINGS Segmentation: 5 lumbar type vertebrae. Alignment: Trace retrolisthesis of L4 on L5 and L5 on S1. Slight right convex curvature of the upper lumbar spine. Vertebrae: No acute fracture or suspicious osseous lesion. Paraspinal and other soft tissues: Mild abdominal aortic atherosclerosis. Disc levels: Mild disc degeneration, greatest at L4-5 where disc bulging, spurring, and mild facet hypertrophy result in borderline spinal stenosis and mild right lateral recess and right neural foraminal stenosis. IMPRESSION: 1. Destructive lesion involving the T12 vertebral body and left-sided posterior elements with pathologic vertebral body fracture and epidural tumor resulting in at least  mild spinal stenosis. Smaller lesion in the T9 vertebral body. Findings are most concerning for metastatic disease. 2. Aortic Atherosclerosis (ICD10-I70.0) and Emphysema (ICD10-J43.9). Electronically Signed   By: Logan Bores M.D.   On: 04/23/2021 18:41   MR THORACIC SPINE W WO CONTRAST  Result Date: 04/24/2021 CLINICAL DATA:  Back pain.  Metastatic disease. EXAM: MRI THORACIC AND LUMBAR SPINE WITHOUT AND WITH CONTRAST TECHNIQUE: Multiplanar and multiecho pulse sequences of the thoracic and lumbar spine were obtained without and with intravenous contrast. CONTRAST:  37mL GADAVIST GADOBUTROL 1 MMOL/ML IV SOLN COMPARISON:  CT thoracic and lumbar spine 04/23/2021 FINDINGS: MRI THORACIC SPINE FINDINGS Alignment: Exaggerated upper thoracic kyphosis. No significant listhesis. Vertebrae: Abnormal T1 hypointensity, T2/STIR hyperintensity, and enhancement throughout the T9 vertebral body including a more focal 2 cm lesion in the left aspect of the vertebral body corresponding to the lytic lesion on CT. 2 cm lesion in the posterior aspect of the T5 vertebral body. Subcentimeter enhancing lesion in the right T11 pars. 1 cm enhancing lesion in the base of the T10 spinous process. Subcentimeter enhancing lesion versus vascular enhancement in the left T10 pedicle. Destructive lesion involving the T12 vertebral body and left-sided posterior elements with pathologic fracture as described on CT with extraosseous tumor encroaching upon the left T11-12 and T12-L1 neural foramina and with ventral and left-sided epidural tumor resulting in mild spinal stenosis without cord compression. Mild edema and enhancement along the endplates at X9-0, likely degenerative. Cord:  Normal cord signal. 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 Disc levels: Mild spinal stenosis at T12 due to epidural tumor. Mild thoracic spondylosis elsewhere without significant degenerative stenosis. MRI LUMBAR SPINE FINDINGS Segmentation:   Standard. Alignment: Slight right convex curvature of the lumbar spine. Trace retrolisthesis of L4 on L5 and L5 on S1. Vertebrae: Enhancing lesions in the L1 vertebral body and spinous process without evidence of associated epidural tumor subcentimeter enhancing lesion in the right L5 pedicle. No fracture. Conus medullaris: Extends to the L1-2 level and appears normal. Paraspinal and other soft tissues: Paraspinal tumor at T12 as described above extending to the L1 level. Disc levels: L1-2: Disc desiccation. Mild disc bulging and mild facet hypertrophy without stenosis. L2-3: Minimal disc bulging and mild-to-moderate facet hypertrophy without stenosis. L3-4: Disc desiccation. Disc bulging, a left foraminal disc osteophyte complex, and mild to moderate facet hypertrophy without significant stenosis. L4-5: Disc desiccation and mild-to-moderate disc space narrowing. Disc bulging, a small central disc protrusion, and mild-to-moderate facet and ligamentum flavum hypertrophy result in borderline to mild bilateral lateral recess and right neural foraminal stenosis without spinal stenosis. L5-S1: Disc desiccation and mild-to-moderate disc space narrowing. Disc bulging, a small left central disc protrusion, and mild facet hypertrophy result in borderline to mild left lateral recess stenosis without spinal or neural foraminal stenosis. IMPRESSION: 1. Multiple enhancing thoracic and lumbar spine lesions consistent with metastases. 2. 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 Electronically Signed   By: Logan Bores M.D.   On: 04/24/2021 13:58   MR Lumbar Spine W Wo Contrast  Result Date: 04/24/2021 CLINICAL DATA:  Back pain.  Metastatic disease. EXAM: MRI THORACIC AND LUMBAR SPINE WITHOUT AND WITH CONTRAST TECHNIQUE: Multiplanar and multiecho pulse sequences of the thoracic and lumbar spine were  obtained without and with intravenous contrast. CONTRAST:  43mL GADAVIST GADOBUTROL 1 MMOL/ML IV SOLN COMPARISON:  CT thoracic and lumbar spine 04/23/2021 FINDINGS: MRI  THORACIC SPINE FINDINGS Alignment: Exaggerated upper thoracic kyphosis. No significant listhesis. Vertebrae: Abnormal T1 hypointensity, T2/STIR hyperintensity, and enhancement throughout the T9 vertebral body including a more focal 2 cm lesion in the left aspect of the vertebral body corresponding to the lytic lesion on CT. 2 cm lesion in the posterior aspect of the T5 vertebral body. Subcentimeter enhancing lesion in the right T11 pars. 1 cm enhancing lesion in the base of the T10 spinous process. Subcentimeter enhancing lesion versus vascular enhancement in the left T10 pedicle. Destructive lesion involving the T12 vertebral body and left-sided posterior elements with pathologic fracture as described on CT with extraosseous tumor encroaching upon the left T11-12 and T12-L1 neural foramina and with ventral and left-sided epidural tumor resulting in mild spinal stenosis without cord compression. Mild edema and enhancement along the endplates at K5-3, likely degenerative. Cord:  Normal cord signal. 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 Disc levels: Mild spinal stenosis at T12 due to epidural tumor. Mild thoracic spondylosis elsewhere without significant degenerative stenosis. MRI LUMBAR SPINE FINDINGS Segmentation:  Standard. Alignment: Slight right convex curvature of the lumbar spine. Trace retrolisthesis of L4 on L5 and L5 on S1. Vertebrae: Enhancing lesions in the L1 vertebral body and spinous process without evidence of associated epidural tumor subcentimeter enhancing lesion in the right L5 pedicle. No fracture. Conus medullaris: Extends to the L1-2 level and appears normal. Paraspinal and other soft tissues: Paraspinal tumor at T12 as described above extending to the L1 level. Disc levels: L1-2: Disc desiccation. Mild disc  bulging and mild facet hypertrophy without stenosis. L2-3: Minimal disc bulging and mild-to-moderate facet hypertrophy without stenosis. L3-4: Disc desiccation. Disc bulging, a left foraminal disc osteophyte complex, and mild to moderate facet hypertrophy without significant stenosis. L4-5: Disc desiccation and mild-to-moderate disc space narrowing. Disc bulging, a small central disc protrusion, and mild-to-moderate facet and ligamentum flavum hypertrophy result in borderline to mild bilateral lateral recess and right neural foraminal stenosis without spinal stenosis. L5-S1: Disc desiccation and mild-to-moderate disc space narrowing. Disc bulging, a small left central disc protrusion, and mild facet hypertrophy result in borderline to mild left lateral recess stenosis without spinal or neural foraminal stenosis. IMPRESSION: 1. Multiple enhancing thoracic and lumbar spine lesions consistent with metastases. 2. 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 Electronically Signed   By: Logan Bores M.D.   On: 04/24/2021 13:58   CT CHEST ABDOMEN PELVIS W CONTRAST  Result Date: 04/23/2021 CLINICAL DATA:  Permeative lytic destructive lesion of the T12 vertebral body and left-sided posterior elements with pathologic fracture and mild vertebral height loss, worrisome for metastatic disease. Evaluate for primary. 2 cm lytic lesion left lateral T9 body. EXAM: CT CHEST, ABDOMEN, AND PELVIS WITH CONTRAST TECHNIQUE: Multidetector CT imaging of the chest, abdomen and pelvis was performed following the standard protocol during bolus administration of intravenous contrast. CONTRAST:  18mL OMNIPAQUE IOHEXOL 350 MG/ML SOLN COMPARISON:  CT thoracic and lumbar spine earlier today. FINDINGS: CT CHEST FINDINGS Cardiovascular: The cardiac size is normal. There are scattered calcifications in the LAD coronary artery.  Early changes of aortic atherosclerosis. 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 There is no arteriovenous dilatation or central arterial embolus. No pericardial effusion. Mediastinum/Nodes: There are slightly prominent left hilar lymph nodes with a lymph node measuring 1.2 cm in short axis in  the anterior left hilum on series 2 axial 36 and another more posteriorly measuring 1 cm in short axis on axial 41. There is no bulky, encasing or further intrathoracic adenopathy, no mediastinal or axillary mass or thyroid lesions. There is no tracheal or central airway filling defect or significant esophageal thickening. Minimally small hiatal hernia. Lungs/Pleura: No pleural effusion, thickening or pneumothorax. There is a spiculated mass in the left upper lobe laterally at mid field measuring 1.8 x 1.6 x 1.0 cm, with pleural stranding lateral to it. 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 Bilateral apical pleuroparenchymal scar-like opacities are noted, with bilateral paraseptal emphysematous changes in the lungs predominating in the upper lobes, relatively milder centrilobular changes lung apices. There are patchy hazy opacities in the lingula anteriorly which could be chronic change or pneumonitis. There are scattered linear scar-like opacities in the bases. No other nodules or infiltrates are observed. 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 There is left paraspinal tumor extension at the level of the vertebral body as well. A 2 cm T9 vertebral body lytic lesion is again noted to the left anteriorly, with adjacent paraspinal soft tissue thickening without  intracanalicular extension. No other focal bone lesions are seen in the thoracic osseous structures. Asymmetric elevation right hemidiaphragm is also seen. CT ABDOMEN PELVIS FINDINGS Hepatobiliary: Slightly steatotic liver without mass enhancement. The gallbladder and bile ducts unremarkable. Pancreas: Moderately fatty atrophic without mass enhancement or ductal dilatation. Spleen: Unremarkable. Adrenals/Urinary Tract: Indeterminate 1.5 cm heterogeneous right adrenal nodule. There is no left adrenal mass , no focal renal cortical abnormality. There are either are few small scattered nonobstructive caliceal stones in the kidneys or early contrast excretion mimicking stones. There is no obstructive stone or hydroureteronephrosis no bladder thickening. Stomach/Bowel: The gastric wall is contracted. The unopacified small bowel demonstrating fold thickening in the duodenum and jejunum with suspected fold thickening in the underdistended proximal stomach. There is no small bowel obstruction or inflammation. The appendix is normal. Moderate constipation is seen. Left colonic diverticula without diverticulitis. There is moderate wall thickening versus nondistention in the rectum. Vascular/Lymphatic: Aortic atherosclerosis. No enlarged abdominal or pelvic lymph nodes. Reproductive: Enlarged prostate, 5.5 cm transverse with bladder base impression. Other: There is no free air, hemorrhage or fluid. Small umbilical fat hernia. Musculoskeletal: No regional destructive bone lesions. Moderate left-greater-than-right hip DJD. Degenerative changes and mild dextroscoliosis lumbar spine. IMPRESSION: 1. 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 There are 2 slightly prominent left hilar lymph nodes but no further chest adenopathy. 2. 2 cm lytic lesion T9 vertebral body with extravertebral extension, with permeative lytic destructive  lesion in the T12 vertebral body and left pedicle with intracanalicular epidural extension partially effacing the left hemicanal and ventral CSF, findings most likely metastatic in etiology. 3. Patchy haziness in the anterior lingula which could be chronic change or pneumonitis. 4. Indeterminate heterogeneous 1.5 cm right adrenal mass, possible metastasis. PET-CT may be helpful. 5. COPD and scarring change. 6. 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 No AAA. 7. Moderate thickening of the rectum versus underdistention. Further evaluation recommended. 8. Enlarged prostate. 9. Constipation, and additional findings suggesting gastroenteritis. No small bowel  obstruction or inflammatory changes. 10. No other CT evidence of metastatic disease. Additional findings discussed above. Electronically Signed   By: Telford Nab M.D.   On: 04/23/2021 21:03      IMPRESSION/PLAN: 1. 66 y.o. gentleman with painful T12 spinal metastasis.  He has a pending biopsy to confirm stage IV cancer, possibly lung cancer.  He may benefit from palliative radiotherapy to prevent spinal cord injury and reduce pain.  Today, I talked to the patient and family about the findings and work-up thus far.  We discussed the natural history of spinal metastases and general treatment, highlighting the role of radiotherapy in the management.  We discussed the available radiation techniques, and focused on the details of logistics and delivery.  We reviewed the anticipated acute and late sequelae associated with radiation in this setting.  The patient was encouraged to ask questions that I answered to the best of my ability.    The patient would like to proceed with radiation and will be scheduled for CT simulation.  I personally spent 60 minutes in this encounter including chart review, reviewing radiological studies, meeting face-to-face with the patient, entering orders and completing  documentation.      Tyler Pita, MD  [facility]   Radiation Oncology Direct Dial: [phone]   Fax: [phone] [email or web address]   Skype   [email or web address]

## 2021-04-26 NOTE — Progress Notes (Signed)
PROGRESS NOTE    Christopher Dawson  HCW:[id number] DOB: 1955-05-24 DOA: 04/23/2021 PCP: [name], MD   Chief Complaint  Patient presents with   Back Pain   Brief Narrative/Hospital Course: Christopher Dawson, 66 y.o. male with PMH of anxiety/depression, chronic lumbar radiculopathy, tobacco abuse, alcohol abuse and ongoing physical deconditioning for the last 6 months using cane/walker at home presented with intractable back pain and inability to ambulate, or get up to cook his meals or by groceries. He is a longstanding smoker at least for 45 years quit about 8 years ago, started at the age of 30. Complains of constipation no urinary issues. In the ED -CT scan-T12 and T9 vertebra process/destruction with concern for metastatic malignant lesions.  Neurosurgery and oncology as discussed CT chest abdomen pelvis and MRI ordered- "1.8 x 1.6 x 1.0 cm left upper lobe spiculated nodule with pleural stranding" concerning for primary neoplasm, MRI showed T12 vertebral body lesion concerning for metastatic disease with paraspinal mass. No need for urgent neurosurgical intervention, radiation oncology consulted for XRT and and IR consulted for biopsy.  Subjective: Seen this morning.  Complains of ongoing back pain, awaiting for biopsy at 11 AM today Overnight no fever.   Remains on oral oxycodone and IV Dilaudid PRN for his back pain.   For biopsy today  Assessment & Plan:  Left lung mass with left hilar lymph nodes Destructive lesion involving T12 with epidural extension, T9 lytic lesion Right adrenal mass: Patient longstanding history of smoking at this time conservative primary pulmonary neoplasm with metastasis as above.  For CT-guided biopsy today oncology and echocardiogram #1.  Evaluated by neurosurgery no need for decompressive surgery.  Continue IV Decadron and pain management.  Intractable back pain from metastasis: Continue aggressive pain management, IV steroid as above waiting  for biopsy and then XRT.  PMT consulted  Tobacco abuse cessation advised, cont nicotine patch Chronic back pain-was dealing with back issues for several years. History of cerebral palsy Physical deconditioning in the setting of #1 hold off on PT OT for now until back is stabilized  Depression mood is stable, continue his home regimen Alcohol abuse cessation has been counseled. watch for withdrawal, continue CIWA protocol thiamine folate Undernutrition in the setting of malignancy.  Augment diet RD following   Leukocytosis likely from steroid.  No fever.  Monitor.  DVT prophylaxis: heparin injection 5,000 Units Start: 04/24/21 2200 Code Status:   Code Status: Partial Code Family Communication: plan of care discussed with patient at bedside. Status is: Inpatient Remains inpatient appropriate because: Intractable back pain and malignancy work-up Disposition: Currently not medically stable for discharge. Anticipated Disposition: TBD  Objective: Vitals last 24 hrs: Vitals:   04/25/21 0840 04/25/21 1420 04/25/21 2051 04/26/21 0620  BP: (!) 143/80 139/79 133/88 (!) 142/88  Pulse: 99 100 (!) 102 (!) 105  Resp: 16 16 18 19   Temp: 98 F (36.7 C) 97.7 F (36.5 C) 97.7 F (36.5 C) 97.9 F (36.6 C)  TempSrc: Oral Oral Oral Oral  SpO2: 95% 96% 94% 100%  Weight:      Height:       Weight change:   Intake/Output Summary (Last 24 hours) at 04/26/2021 1038 Last data filed at 04/26/2021 1000 Gross per 24 hour  Intake 240 ml  Output 1410 ml  Net -1170 ml   Net IO Since Admission: 1,478.98 mL [04/26/21 1038]   Physical Examination: General exam: AAOx 3, pleasant. HEENT:Oral mucosa moist, Ear/Nose WNL grossly, dentition normal. Respiratory system: bilaterally  clear,no use of accessory muscle Cardiovascular system: S1 & S2 +, No JVD,. Gastrointestinal system: Abdomen soft, NT,ND, BS+ Nervous System:Alert, awake, moving extremities and grossly nonfocal Extremities: no edema, distal  peripheral pulses palpable.  Skin: No rashes,no icterus. MSK: Normal muscle bulk,tone, power  Medications reviewed:  Scheduled Meds:  clomiPRAMINE  100 mg Oral QHS   dexamethasone (DECADRON) injection  4 mg Intravenous Q6H   feeding supplement  237 mL Oral TID BM   folic acid  1 mg Oral Daily   heparin injection (subcutaneous)  5,000 Units Subcutaneous Q8H   multivitamin with minerals  1 tablet Oral Daily   nicotine  14 mg Transdermal Q24H   pantoprazole  40 mg Oral Daily   senna-docusate  1 tablet Oral BID   thiamine  100 mg Oral Daily   Continuous Infusions:   Diet Order             Diet NPO time specified Except for: Sips with Meds  Diet effective midnight                 Weight change:   Wt Readings from Last 3 Encounters:  04/23/21 67.2 kg  11/25/20 92.3 kg  09/24/19 86.2 kg     Consultants:see note  Procedures:see note Antimicrobials: Anti-infectives (From admission, onward)    None      Culture/Microbiology Other culture-see note  Unresulted Labs (From admission, onward)    None      Data Reviewed: I have personally reviewed following labs and imaging studies CBC: Recent Labs  Lab 04/23/21 1619 04/23/21 2213 04/24/21 0307 04/25/21 0323  WBC 10.6* 8.6 4.5 15.8*  NEUTROABS 7.8*  --   --   --   HGB 15.8 13.9 13.7 13.0  HCT 47.3 42.3 41.7 39.1  MCV 89.6 89.8 90.1 90.9  PLT 376 338 315 811   Basic Metabolic Panel: Recent Labs  Lab 04/23/21 1619 04/23/21 2213 04/24/21 0307 04/25/21 0323  NA 136  --  136 139  K 3.7  --  3.9 3.8  CL 102  --  106 110  CO2 25  --  23 25  GLUCOSE 90  --  179* 165*  BUN 19  --  17 19  CREATININE 0.57* 0.39* 0.48* 0.53*  CALCIUM 9.1  --  9.0 8.8*  MG  --  2.1  --   --   PHOS  --  3.1  --   --    GFR: Estimated Creatinine Clearance: 86.3 mL/min (A) (by C-G formula based on SCr of 0.53 mg/dL (L)). Liver Function Tests: Recent Labs  Lab 04/24/21 0307  AST 13*  ALT 15  ALKPHOS 68  BILITOT 0.7   PROT 6.5  ALBUMIN 3.1*   No results for input(s): LIPASE, AMYLASE in the last 168 hours. No results for input(s): AMMONIA in the last 168 hours. Coagulation Profile: No results for input(s): INR, PROTIME in the last 168 hours. Cardiac Enzymes: No results for input(s): CKTOTAL, CKMB, CKMBINDEX, TROPONINI in the last 168 hours. BNP (last 3 results) No results for input(s): PROBNP in the last 8760 hours. HbA1C: No results for input(s): HGBA1C in the last 72 hours. CBG: No results for input(s): GLUCAP in the last 168 hours. Lipid Profile: No results for input(s): CHOL, HDL, LDLCALC, TRIG, CHOLHDL, LDLDIRECT in the last 72 hours. Thyroid Function Tests: Recent Labs    04/23/21 2213  TSH 1.861   Anemia Panel: No results for input(s): VITAMINB12, FOLATE, FERRITIN, TIBC, IRON, RETICCTPCT in  the last 72 hours. Sepsis Labs: No results for input(s): PROCALCITON, LATICACIDVEN in the last 168 hours.  Recent Results (from the past 240 hour(s))  Resp Panel by RT-PCR (Flu A&B, Covid) Nasopharyngeal Swab     Status: None   Collection Time: 04/23/21  8:05 PM   Specimen: Nasopharyngeal Swab; Nasopharyngeal(NP) swabs in vial transport medium  Result Value Ref Range Status   SARS Coronavirus 2 by RT PCR NEGATIVE NEGATIVE Final    Comment: (NOTE) SARS-CoV-2 target nucleic acids are NOT DETECTED.  The SARS-CoV-2 RNA is generally detectable in upper respiratory specimens during the acute phase of infection. The lowest concentration of SARS-CoV-2 viral copies this assay can detect is 138 copies/mL. A negative result does not preclude SARS-Cov-2 infection and should not be used as the sole basis for treatment or other patient management decisions. A negative result may occur with  improper specimen collection/handling, submission of specimen other than nasopharyngeal swab, presence of viral mutation(s) within the areas targeted by this assay, and inadequate number of viral copies(<138  copies/mL). A negative result must be combined with clinical observations, patient history, and epidemiological information. The expected result is Negative.  Fact Sheet for Patients:  EntrepreneurPulse.com.au  Fact Sheet for Healthcare Providers:  IncredibleEmployment.be  This test is no t yet approved or cleared by the [place] FDA and  has been authorized for detection and/or diagnosis of SARS-CoV-2 by FDA under an Emergency Use Authorization (EUA). This EUA will remain  in effect (meaning this test can be used) for the duration of the COVID-19 declaration under Section 564(b)(1) of the Act, 21 U.S.C.section 360bbb-3(b)(1), unless the authorization is terminated  or revoked sooner.       Influenza A by PCR NEGATIVE NEGATIVE Final   Influenza B by PCR NEGATIVE NEGATIVE Final    Comment: (NOTE) The Xpert Xpress SARS-CoV-2/FLU/RSV plus assay is intended as an aid in the diagnosis of influenza from Nasopharyngeal swab specimens and should not be used as a sole basis for treatment. Nasal washings and aspirates are unacceptable for Xpert Xpress SARS-CoV-2/FLU/RSV testing.  Fact Sheet for Patients: EntrepreneurPulse.com.au  Fact Sheet for Healthcare Providers: IncredibleEmployment.be  This test is not yet approved or cleared by the [place] FDA and has been authorized for detection and/or diagnosis of SARS-CoV-2 by FDA under an Emergency Use Authorization (EUA). This EUA will remain in effect (meaning this test can be used) for the duration of the COVID-19 declaration under Section 564(b)(1) of the Act, 21 U.S.C. section 360bbb-3(b)(1), unless the authorization is terminated or revoked.  Performed at [facility], [place] [address], [place], [place] [address]      Radiology Studies: MR THORACIC SPINE W WO CONTRAST  Result Date: 04/24/2021 CLINICAL DATA:  Back pain.  Metastatic  disease. EXAM: MRI THORACIC AND LUMBAR SPINE WITHOUT AND WITH CONTRAST TECHNIQUE: Multiplanar and multiecho pulse sequences of the thoracic and lumbar spine were obtained without and with intravenous contrast. CONTRAST:  68mL GADAVIST GADOBUTROL 1 MMOL/ML IV SOLN COMPARISON:  CT thoracic and lumbar spine 04/23/2021 FINDINGS: MRI THORACIC SPINE FINDINGS Alignment: Exaggerated upper thoracic kyphosis. No significant listhesis. Vertebrae: Abnormal T1 hypointensity, T2/STIR hyperintensity, and enhancement throughout the T9 vertebral body including a more focal 2 cm lesion in the left aspect of the vertebral body corresponding to the lytic lesion on CT. 2 cm lesion in the posterior aspect of the T5 vertebral body. Subcentimeter enhancing lesion in the right T11 pars. 1 cm enhancing lesion in the base of the T10  spinous process. Subcentimeter enhancing lesion versus vascular enhancement in the left T10 pedicle. Destructive lesion involving the T12 vertebral body and left-sided posterior elements with pathologic fracture as described on CT with extraosseous tumor encroaching upon the left T11-12 and T12-L1 neural foramina and with ventral and left-sided epidural tumor resulting in mild spinal stenosis without cord compression. Mild edema and enhancement along the endplates at Z6-1, likely degenerative. Cord:  Normal cord signal. 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 Disc levels: Mild spinal stenosis at T12 due to epidural tumor. Mild thoracic spondylosis elsewhere without significant degenerative stenosis. MRI LUMBAR SPINE FINDINGS Segmentation:  Standard. Alignment: Slight right convex curvature of the lumbar spine. Trace retrolisthesis of L4 on L5 and L5 on S1. Vertebrae: Enhancing lesions in the L1 vertebral body and spinous process without evidence of associated epidural tumor subcentimeter enhancing lesion in the right L5 pedicle. No fracture. Conus medullaris: Extends to the L1-2 level and  appears normal. Paraspinal and other soft tissues: Paraspinal tumor at T12 as described above extending to the L1 level. Disc levels: L1-2: Disc desiccation. Mild disc bulging and mild facet hypertrophy without stenosis. L2-3: Minimal disc bulging and mild-to-moderate facet hypertrophy without stenosis. L3-4: Disc desiccation. Disc bulging, a left foraminal disc osteophyte complex, and mild to moderate facet hypertrophy without significant stenosis. L4-5: Disc desiccation and mild-to-moderate disc space narrowing. Disc bulging, a small central disc protrusion, and mild-to-moderate facet and ligamentum flavum hypertrophy result in borderline to mild bilateral lateral recess and right neural foraminal stenosis without spinal stenosis. L5-S1: Disc desiccation and mild-to-moderate disc space narrowing. Disc bulging, a small left central disc protrusion, and mild facet hypertrophy result in borderline to mild left lateral recess stenosis without spinal or neural foraminal stenosis. IMPRESSION: 1. Multiple enhancing thoracic and lumbar spine lesions consistent with metastases. 2. 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 Electronically Signed   By: Logan Bores M.D.   On: 04/24/2021 13:58   MR Lumbar Spine W Wo Contrast  Result Date: 04/24/2021 CLINICAL DATA:  Back pain.  Metastatic disease. EXAM: MRI THORACIC AND LUMBAR SPINE WITHOUT AND WITH CONTRAST TECHNIQUE: Multiplanar and multiecho pulse sequences of the thoracic and lumbar spine were obtained without and with intravenous contrast. CONTRAST:  47mL GADAVIST GADOBUTROL 1 MMOL/ML IV SOLN COMPARISON:  CT thoracic and lumbar spine 04/23/2021 FINDINGS: MRI THORACIC SPINE FINDINGS Alignment: Exaggerated upper thoracic kyphosis. No significant listhesis. Vertebrae: Abnormal T1 hypointensity, T2/STIR hyperintensity, and enhancement throughout the T9  vertebral body including a more focal 2 cm lesion in the left aspect of the vertebral body corresponding to the lytic lesion on CT. 2 cm lesion in the posterior aspect of the T5 vertebral body. Subcentimeter enhancing lesion in the right T11 pars. 1 cm enhancing lesion in the base of the T10 spinous process. Subcentimeter enhancing lesion versus vascular enhancement in the left T10 pedicle. Destructive lesion involving the T12 vertebral body and left-sided posterior elements with pathologic fracture as described on CT with extraosseous tumor encroaching upon the left T11-12 and T12-L1 neural foramina and with ventral and left-sided epidural tumor resulting in mild spinal stenosis without cord compression. Mild edema and enhancement along the endplates at W9-6, likely degenerative. Cord:  Normal cord signal. 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 Disc levels: Mild spinal stenosis at T12 due to epidural tumor. Mild thoracic spondylosis elsewhere without significant degenerative stenosis. MRI LUMBAR SPINE FINDINGS  Segmentation:  Standard. Alignment: Slight right convex curvature of the lumbar spine. Trace retrolisthesis of L4 on L5 and L5 on S1. Vertebrae: Enhancing lesions in the L1 vertebral body and spinous process without evidence of associated epidural tumor subcentimeter enhancing lesion in the right L5 pedicle. No fracture. Conus medullaris: Extends to the L1-2 level and appears normal. Paraspinal and other soft tissues: Paraspinal tumor at T12 as described above extending to the L1 level. Disc levels: L1-2: Disc desiccation. Mild disc bulging and mild facet hypertrophy without stenosis. L2-3: Minimal disc bulging and mild-to-moderate facet hypertrophy without stenosis. L3-4: Disc desiccation. Disc bulging, a left foraminal disc osteophyte complex, and mild to moderate facet hypertrophy without significant stenosis. L4-5: Disc desiccation and mild-to-moderate disc space narrowing. Disc  bulging, a small central disc protrusion, and mild-to-moderate facet and ligamentum flavum hypertrophy result in borderline to mild bilateral lateral recess and right neural foraminal stenosis without spinal stenosis. L5-S1: Disc desiccation and mild-to-moderate disc space narrowing. Disc bulging, a small left central disc protrusion, and mild facet hypertrophy result in borderline to mild left lateral recess stenosis without spinal or neural foraminal stenosis. IMPRESSION: 1. Multiple enhancing thoracic and lumbar spine lesions consistent with metastases. 2. 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 Electronically Signed   By: Logan Bores M.D.   On: 04/24/2021 13:58     LOS: 3 days   Antonieta Pert, MD [facility]  04/26/2021, 10:38 AM

## 2021-04-26 NOTE — Progress Notes (Signed)
Dressing to back is clean/dry/intact.

## 2021-04-26 NOTE — Progress Notes (Signed)
° ° ° °  Came by to se patient twice today. Was off the unit both times (IR for biopsy and later was down in [place] for Simulation). Will attempt to follow-up with patient at later time/date.   Alda Lea, AGPCNP-BC Palliative Medicine Team/[facility] Pager: [phone] Amion: N. [place]   NO CHARGE

## 2021-04-26 NOTE — Progress Notes (Signed)
°  Radiation Oncology         [phone] ________________________________  Name: Zaelyn Barbary MRN: [id number]  Date: 04/26/2021  DOB: 1955-09-09  INPATIENT  SIMULATION AND TREATMENT PLANNING NOTE    ICD-10-CM   1. Intractable back pain  M54.9     2. Spine metastasis ([place])  C79.51       DIAGNOSIS:  66 y.o. patient with symptomatic T9 and T12 spinal metastasis  NARRATIVE:  The patient was brought to the [facility].  Identity was confirmed.  All relevant records and images related to the planned course of therapy were reviewed.  The patient freely provided informed written consent to proceed with treatment after reviewing the details related to the planned course of therapy. The consent form was witnessed and verified by the simulation staff.  Then, the patient was set-up in a stable reproducible  supine position for radiation therapy.  CT images were obtained.  Surface markings were placed.  The CT images were loaded into the planning software.  Then the target and avoidance structures were contoured including kidneys.  Treatment planning then occurred.  The radiation prescription was entered and confirmed.  Then, I designed and supervised the construction of a total of 3 medically necessary complex treatment devices with VacLoc positioner and 2 MLCs to shield kidneys.  I have requested : 3D Simulation  I have requested a DVH of the following structures: Left Kidney, Right Kidney and target.  PLAN:  The patient will receive 30 Gy in 10 fractions.  ________________________________  Sheral Apley Tammi Klippel, M.D.

## 2021-04-26 NOTE — Plan of Care (Signed)

## 2021-04-27 ENCOUNTER — Ambulatory Visit
Admit: 2021-04-27 | Discharge: 2021-04-27 | Disposition: A | Discharge status: Home / Self Care | Payer: Medicare Other | Attending: Radiation Oncology | Admitting: Radiation Oncology

## 2021-04-27 DIAGNOSIS — R911 Solitary pulmonary nodule: Secondary | ICD-10-CM | POA: Diagnosis not present

## 2021-04-27 DIAGNOSIS — M549 Dorsalgia, unspecified: Secondary | ICD-10-CM | POA: Diagnosis not present

## 2021-04-27 DIAGNOSIS — R222 Localized swelling, mass and lump, trunk: Secondary | ICD-10-CM | POA: Diagnosis not present

## 2021-04-27 DIAGNOSIS — M8448XA Pathological fracture, other site, initial encounter for fracture: Secondary | ICD-10-CM | POA: Diagnosis not present

## 2021-04-27 DIAGNOSIS — M546 Pain in thoracic spine: Secondary | ICD-10-CM | POA: Diagnosis not present

## 2021-04-27 DIAGNOSIS — M899 Disorder of bone, unspecified: Secondary | ICD-10-CM | POA: Diagnosis not present

## 2021-04-27 LAB — SURGICAL PATHOLOGY

## 2021-04-27 MED ORDER — HYDROXYZINE HCL 25 MG PO TABS
25.0000 mg | ORAL_TABLET | Freq: Three times a day (TID) | ORAL | Status: DC | PRN
  Administered 2021-04-27 – 2021-05-02 (×5): 25 mg via ORAL
  Filled 2021-04-27 (×6): qty 1

## 2021-04-27 NOTE — Evaluation (Addendum)
Physical Therapy Evaluation Patient Details Name: Christopher Dawson MRN: [id number] DOB: Mar 11, 1956 Today's Date: 04/27/2021  History of Present Illness  66 y.o. male with PMH of anxiety/depression, chronic lumbar radiculopathy, tobacco abuse, [place], alcohol abuse. Pt verbally reports h/o cerebral palsy with 2 childhood surgeries "so I wouldn't walk on my toes."  Pt presented with  ongoing physical deconditioning for the last 6 months using cane/walker at home presented with intractable back pain and inability to ambulate, or get up to cook his meals or by groceries. Complains of constipation no urinary issues.  In the ED -CT scan-T12 and T9 vertebra process/destruction with concern for metastatic malignant lesions. 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  Clinical Impression  Pt admitted with above diagnosis. Pt ambulated 24' with RW with B knees flexed and ankles plantar flexed. Pt reports h/o cerebral palsy but stated his current walking pattern is due to back pain. Ambulation distance limited by back pain. Gait was unsteady. Pt demonstrates overall decreased safety awareness. He reports he has no assistance available at home. His appearance is unkempt, his feet are covered in thick, scaly, dry skin. His shoes are very dirty and smell strongly of urine. He reports 3 recent falls at the curb in front of his condo. He is not safe to return home alone.  Pt currently with functional limitations due to the deficits listed below (see PT Problem List). Pt will benefit from skilled PT to increase their independence and safety with mobility to allow discharge to the venue listed below.          Recommendations for follow up therapy are one component of a multi-disciplinary discharge planning process, led by the attending physician.  Recommendations may be updated based on patient status, additional functional criteria and insurance authorization.  Follow Up Recommendations Skilled nursing-short term rehab (<3  hours/day)    Assistance Recommended at Discharge Frequent or constant Supervision/Assistance  Patient can return home with the following  A lot of help with bathing/dressing/bathroom;A little help with walking and/or transfers;Assistance with cooking/housework;Assist for transportation;Help with stairs or ramp for entrance    Equipment Recommendations Wheelchair (measurements PT);Wheelchair cushion (measurements PT)  Recommendations for Other Services       Functional Status Assessment Patient has had a recent decline in their functional status and demonstrates the ability to make significant improvements in function in a reasonable and predictable amount of time.     Precautions / Restrictions Precautions Precautions: Fall Precaution Comments: reports 3 falls in past 6 months, all occurred at curb to enter his condo Restrictions Weight Bearing Restrictions: No      Mobility  Bed Mobility Overal bed mobility: Modified Independent             General bed mobility comments: used bedrail for supine to sit; poorly controlled descent of trunk for sit to supine    Transfers Overall transfer level: Needs assistance Equipment used: Rolling walker (2 wheels) Transfers: Sit to/from Stand Sit to Stand: From elevated surface;Min guard           General transfer comment: bed elevated, VCs hand placement, uses momentum to power up; with recliner to bed transfer was unwilling to follow instructions to place both hands on RW handles (he placed R hand on L handle of RW). Decreased safety awareness.    Ambulation/Gait Ambulation/Gait assistance: Min assist Gait Distance (Feet): 24 Feet Assistive device: Rolling walker (2 wheels) Gait Pattern/deviations: Knee flexed in stance - right;Knee flexed in stance - left;Decreased dorsiflexion - right;Decreased dorsiflexion -  left;Narrow base of support;Shuffle;Decreased step length - right;Decreased step length - left Gait velocity: decr      General Gait Details: Pt toe walks, no heel strike, BLEs adducted with knees flexed in standing; pt reports h/o cerebral palsy for which he had 2 surgeries as a child "so I wouldn't walk on my toes"; He reports he's walking on his toes currently bc of the back pain. Distance limited by back pain.  Stairs            Wheelchair Mobility    Modified Rankin (Stroke Patients Only)       Balance Overall balance assessment: Needs assistance   Sitting balance-Leahy Scale: Fair     Standing balance support: Bilateral upper extremity supported Standing balance-Leahy Scale: Poor Standing balance comment: reliant upon BUE support                             Pertinent Vitals/Pain Pain Assessment: 0-10 Pain Score: 7  Pain Location: back Pain Descriptors / Indicators: Constant Pain Intervention(s): Limited activity within patient's tolerance;Monitored during session;Premedicated before session;Repositioned    Home Living Family/patient expects to be discharged to:: Private residence Living Arrangements: Alone     Home Access: Stairs to enter   Technical brewer of Steps: 1 curb   Home Layout: One level Home Equipment: Conservation officer, nature (2 wheels);Cane - single point Additional Comments: Walked with [facility] PTA. Also has rolling walker (it won't fold up, per pt). Reports 3 falls in past 6 months, all occurred at curb at his condo unit. Reports shower nozzle in his bathroom doesn't work. Reports he has no help available. Mother is 82, sister in [place] go live with them".)    Prior Function Prior Level of Function : Independent/Modified Independent             Mobility Comments: walked with cane PTA ADLs Comments: seems he was not able to perform self care well, his shoes reek of urine and are very dirty, feet are covered in scaly dry skin     Hand Dominance        Extremity/Trunk Assessment   Upper Extremity Assessment Upper Extremity  Assessment: Defer to OT evaluation    Lower Extremity Assessment Lower Extremity Assessment: RLE deficits/detail;LLE deficits/detail RLE Deficits / Details: knee ext +3/5 RLE: Unable to fully assess due to pain LLE Deficits / Details: knee ext -4/5 LLE: Unable to fully assess due to pain    Cervical / Trunk Assessment Cervical / Trunk Assessment: Normal  Communication   Communication: No difficulties  Cognition Arousal/Alertness: Awake/alert Behavior During Therapy: Anxious Overall Cognitive Status: No family/caregiver present to determine baseline cognitive functioning                                 General Comments: decreased safety awareness, reported he's overwhelmed with his situation and doesn't know what to do        General Comments      Exercises     Assessment/Plan    PT Assessment Patient needs continued PT services  PT Problem List Decreased strength;Decreased mobility;Decreased activity tolerance;Decreased balance;Pain       PT Treatment Interventions Therapeutic activities;Therapeutic exercise;Gait training;Functional mobility training;Balance training;Patient/family education    PT Goals (Current goals can be found in the Care Plan section)  Acute Rehab PT Goals Patient Stated Goal: reduce back pain PT Goal Formulation:  With patient Time For Goal Achievement: 05/11/21 Potential to Achieve Goals: Fair    Frequency Min 2X/week     Co-evaluation               AM-PAC PT "6 Clicks" Mobility  Outcome Measure Help needed turning from your back to your side while in a flat bed without using bedrails?: A Lot Help needed moving from lying on your back to sitting on the side of a flat bed without using bedrails?: A Lot Help needed moving to and from a bed to a chair (including a wheelchair)?: A Little Help needed standing up from a chair using your arms (e.g., wheelchair or bedside chair)?: A Little Help needed to walk in hospital  room?: A Little Help needed climbing 3-5 steps with a railing? : A Lot 6 Click Score: 15    End of Session Equipment Utilized During Treatment: Gait belt Activity Tolerance: Patient limited by pain;Patient limited by fatigue Patient left: in bed;with bed alarm set Nurse Communication: Mobility status PT Visit Diagnosis: Difficulty in walking, not elsewhere classified (R26.2);Pain;History of falling (Z91.81);Other abnormalities of gait and mobility (R26.89);Unsteadiness on feet (R26.81);Muscle weakness (generalized) (M62.81);Adult, failure to thrive (R62.7) Pain - part of body:  (back)    Time: 8891-6945 PT Time Calculation (min) (ACUTE ONLY): 21 min   Charges:   PT Evaluation $PT Eval Moderate Complexity: 1 Mod         Philomena Doheny PT 04/27/2021  Acute Rehabilitation Services Pager [phone] Office [phone]

## 2021-04-27 NOTE — Plan of Care (Signed)

## 2021-04-27 NOTE — Progress Notes (Signed)
OT Cancellation Note  Patient Details Name: Christopher Dawson MRN: [id number] DOB: [birth year]   Cancelled Treatment:    Reason Eval/Treat Not Completed: Patient at procedure or test/ unavailable (radiation oncology). Will follow.   Malka So, OTR/L 04/27/2021, 12:54 PM

## 2021-04-27 NOTE — Plan of Care (Signed)

## 2021-04-27 NOTE — Progress Notes (Signed)
° °  Chart Reviewed and patient examined at bedside.   Christopher Dawson is sitting up in bed watching tv. No acute distress noted. Reports his pain is well controlled on current regimen Oxy IR 5mg . He has only required 2 doses over the past 24 hours. Reports pain is well managed however he has the most discomfort when in sitting position for long periods of time and also during radiation lying on table. We discussed taking pain medication 30 min-1 hr prior to radiation if scheduled time is known. He verbalized understanding. Communication with nurse also regarding plan.   Denies constipation. Has bowel regimen ordered as needed. He states he is tolerating radiation so far other than the discomfort. Is appreciative this has gotten started and is anxiously awaiting next steps and biopsy.   All questions answered and support provided.   Assessment -NAD -Normal breathing pattern -AAO x3  Plan -Continue current plan of care per medical team -Pain well controlled on Oxy IR. Recommended pre-medicating prior to radiation as this is biggest concern regarding his pain.  -PMT will continue to support and follow as needed. Please secure chat for urgent needs.   Time Total: 25 min.   Visit consisted of counseling and education dealing with the complex and emotionally intense issues of symptom management and palliative care in the setting of serious and potentially life-threatening illness.Greater than 50%  of this time was spent counseling and coordinating care related to the above assessment and plan.  Alda Lea, AGPCNP-BC  Palliative Medicine Team [phone]

## 2021-04-27 NOTE — Progress Notes (Addendum)
PROGRESS NOTE    Christopher Dawson  OZH:[id number] DOB: February 24, 1956 DOA: 04/23/2021 PCP: [name], MD   Brief Narrative: Christopher Dawson is a 66 y.o. male with a history of anxiety, depression, chronic lumbar radiculopathy, tobacco abuse, alcohol abuse. Patient presented secondary to intractable back pain and inability to ambulate. 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 Radiation therapy initiated   Assessment & Plan:   Intractable back pain Secondary to thoracic and lumbar spine lesions concerning for metastasis with associated T12 destructive lesion and associated pathologic fracture/epidural tumor confirmed on MRI. Neurosurgery consulted with recommendations for no need for decompressive surgery. Radiation oncology consulted and have initiated radiation therapy. -Continue Oxycodone prn; discontinue Dilaudid IV -Continue Decadron  Left lung mass Concern for possible malignancy with metastasis. CT guided biopsy performed of left paraspinal lesions at level of T12-L1 performed on 1/3; pathology is pending. Medical oncology consulted and are on board.  Right adrenal mass Indeterminate. Possible metastasis with recommendation for PET-CT.  Tobacco use -Continue nicotine patch  Depression -Continue clomipramine  Alcohol abuse Cessation discussed. Not currently in withdrawal. -Continue thiamine, folate  Underweight Body mass index is 18.52 kg/m.[name] seen by dietitian. -Dietitian recommendations (04/25/21): continue Ensure Enlive but will increase from BID to TID, each supplement provides 350 kcal and 20 grams of protein. complete NFPE when feasible.    DVT prophylaxis: Heparin subq Code Status:   Code Status: Partial Code Family Communication: None at bedside Disposition Plan: Discharge to SNF pending bed availability   Consultants:  Medical  oncology Radiation oncology Neurosurgery Palliative care medicine  Procedures:  RADIATION THERAPY (1/4 >>  Antimicrobials: None    Subjective: Continued back pain. No other concerns.  Objective: Vitals:   04/26/21 2202 04/27/21 0500 04/27/21 0850 04/27/21 1400  BP: (!) 140/94 (!) 148/92 (!) 146/93 (!) 151/85  Pulse: 97 100 (!) 104 100  Resp: 16 16 16 19   Temp: 98.1 F (36.7 C) 97.7 F (36.5 C) 97.7 F (36.5 C) 98.3 F (36.8 C)  TempSrc: Oral  Oral Oral  SpO2: 98% 98% 94% 96%  Weight:      Height:        Intake/Output Summary (Last 24 hours) at 04/27/2021 1749 Last data filed at 04/27/2021 0854 Gross per 24 hour  Intake 240 ml  Output 1070 ml  Net -830 ml   Filed Weights   04/23/21 2343  Weight: 67.2 kg    Examination:  General exam: Appears calm and comfortable Respiratory system: Clear to auscultation. Respiratory effort normal. Cardiovascular system: S1 & S2 heard, RRR. No murmurs, rubs, gallops or clicks. Gastrointestinal system: Abdomen is nondistended, soft and nontender. No organomegaly or masses felt. Normal bowel sounds heard. Central nervous system: Alert and oriented. No focal neurological deficits. Musculoskeletal: No edema. No calf tenderness Skin: No cyanosis. No rashes Psychiatry: Judgement and insight appear normal. Mood & affect appropriate.     Data Reviewed: I have personally reviewed following labs and imaging studies  CBC Lab Results  Component Value Date   WBC 15.8 (H) 04/25/2021   RBC 4.30 04/25/2021   HGB 13.0 04/25/2021   HCT 39.1 04/25/2021   MCV 90.9 04/25/2021   MCH 30.2 04/25/2021   PLT 339 04/25/2021   MCHC 33.2 04/25/2021   RDW 12.3 04/25/2021   LYMPHSABS 1.6 04/23/2021   MONOABS 0.9 04/23/2021   EOSABS 0.1 04/23/2021   BASOSABS 0.0 04/23/2021     Last  metabolic panel Lab Results  Component Value Date   NA 139 04/25/2021   K 3.8 04/25/2021   CL 110 04/25/2021   CO2 25 04/25/2021   BUN 19 04/25/2021    CREATININE 0.53 (L) 04/25/2021   GLUCOSE 165 (H) 04/25/2021   GFRNONAA >60 04/25/2021   GFRAA >60 06/29/2019   CALCIUM 8.8 (L) 04/25/2021   PHOS 3.1 04/23/2021   PROT 6.5 04/24/2021   ALBUMIN 3.1 (L) 04/24/2021   LABGLOB 1.9 07/04/2018   AGRATIO 2.5 (H) 07/04/2018   BILITOT 0.7 04/24/2021   ALKPHOS 68 04/24/2021   AST 13 (L) 04/24/2021   ALT 15 04/24/2021   ANIONGAP 4 (L) 04/25/2021    CBG (last 3)  No results for input(s): GLUCAP in the last 72 hours.   GFR: Estimated Creatinine Clearance: 86.3 mL/min (A) (by C-G formula based on SCr of 0.53 mg/dL (L)).  Coagulation Profile: No results for input(s): INR, PROTIME in the last 168 hours.  Recent Results (from the past 240 hour(s))  Resp Panel by RT-PCR (Flu A&B, Covid) Nasopharyngeal Swab     Status: None   Collection Time: 04/23/21  8:05 PM   Specimen: Nasopharyngeal Swab; Nasopharyngeal(NP) swabs in vial transport medium  Result Value Ref Range Status   SARS Coronavirus 2 by RT PCR NEGATIVE NEGATIVE Final    Comment: (NOTE) SARS-CoV-2 target nucleic acids are NOT DETECTED.  The SARS-CoV-2 RNA is generally detectable in upper respiratory specimens during the acute phase of infection. The lowest concentration of SARS-CoV-2 viral copies this assay can detect is 138 copies/mL. A negative result does not preclude SARS-Cov-2 infection and should not be used as the sole basis for treatment or other patient management decisions. A negative result may occur with  improper specimen collection/handling, submission of specimen other than nasopharyngeal swab, presence of viral mutation(s) within the areas targeted by this assay, and inadequate number of viral copies(<138 copies/mL). A negative result must be combined with clinical observations, patient history, and epidemiological information. The expected result is Negative.  Fact Sheet for Patients:  EntrepreneurPulse.com.au  Fact Sheet for Healthcare  Providers:  IncredibleEmployment.be  This test is no t yet approved or cleared by the [place] FDA and  has been authorized for detection and/or diagnosis of SARS-CoV-2 by FDA under an Emergency Use Authorization (EUA). This EUA will remain  in effect (meaning this test can be used) for the duration of the COVID-19 declaration under Section 564(b)(1) of the Act, 21 U.S.C.section 360bbb-3(b)(1), unless the authorization is terminated  or revoked sooner.       Influenza A by PCR NEGATIVE NEGATIVE Final   Influenza B by PCR NEGATIVE NEGATIVE Final    Comment: (NOTE) The Xpert Xpress SARS-CoV-2/FLU/RSV plus assay is intended as an aid in the diagnosis of influenza from Nasopharyngeal swab specimens and should not be used as a sole basis for treatment. Nasal washings and aspirates are unacceptable for Xpert Xpress SARS-CoV-2/FLU/RSV testing.  Fact Sheet for Patients: EntrepreneurPulse.com.au  Fact Sheet for Healthcare Providers: IncredibleEmployment.be  This test is not yet approved or cleared by the [place] FDA and has been authorized for detection and/or diagnosis of SARS-CoV-2 by FDA under an Emergency Use Authorization (EUA). This EUA will remain in effect (meaning this test can be used) for the duration of the COVID-19 declaration under Section 564(b)(1) of the Act, 21 U.S.C. section 360bbb-3(b)(1), unless the authorization is terminated or revoked.  Performed at [facility], [place] [address] [place], [place] [address]  Radiology Studies: CT BIOPSY  Result Date: 22-May-2021 INDICATION: 66 year old with left lung lesion and bone lesions. Findings are concerning for metastatic disease and needs a tissue diagnosis. EXAM: CT-GUIDED BIOPSY OF PARASPINAL LESION Physician: Stephan Minister. Henn, MD MEDICATIONS: Moderate sedation ANESTHESIA/SEDATION: Moderate (conscious) sedation was employed during  this procedure. A total of Versed 2.0mg  and fentanyl 100 mcg was administered intravenously at the order of the provider performing the procedure. Total intra-service moderate sedation time: 28 minutes. Patient's level of consciousness and vital signs were monitored continuously by radiology nurse throughout the procedure under the supervision of the provider performing the procedure. COMPLICATIONS: None immediate. PROCEDURE: The procedure was explained to the patient. The risks and benefits of the procedure were discussed and the patient's questions were addressed. Informed consent was obtained from the patient. The patient was placed prone on CT table. Images at the thoracolumbar spine were obtained. The left paraspinal soft tissue at T12-L1 was identified and targeted. The overlying skin was prepped with chlorhexidine and sterile field was created. Skin was anesthetized using 1% lidocaine. Small incision was made. Using CT guidance, 17 gauge coaxial needle was directed along the lateral aspect of the left paraspinal tissue. A single core biopsy obtained with an 18 gauge device. Specimen placed in formalin. There was brisk bleeding from the coaxial needle following the single core biopsy. Stylet was replaced within the coaxial needle and checked for bleeding periodically by removing the stylet. There continued to be some bleeding after a few minutes. Needle was slowly withdrawn until the bleeding appeared to stop. There was concern for a small hematoma developing under the skin. As a result, the needle was completely removed and manual compression was placed for approximately 5 minutes. Follow up CT images were obtained. Bandage placed over the puncture site. FINDINGS: Destructive lesion involving the T12 vertebral body with abnormal soft tissue along the left paraspinal region at T12-L1. The biopsy needle was placed along the lateral aspect of this abnormal paraspinal tissue. Single core biopsy was obtained.  Brisk bleeding from the 17 gauge coaxial needle following the core biopsy. Due to the bleeding and concern for a small soft tissue hematoma, no additional core biopsies were obtained. IMPRESSION: CT-guided core biopsy of the left paraspinal lesion at the level of T12-L1. Electronically Signed   By: Markus Daft M.D.   On: 22-May-2021 14:44        Scheduled Meds:  clomiPRAMINE  100 mg Oral QHS   dexamethasone (DECADRON) injection  4 mg Intravenous Q6H   feeding supplement  237 mL Oral TID BM   folic acid  1 mg Oral Daily   heparin injection (subcutaneous)  5,000 Units Subcutaneous Q8H   multivitamin with minerals  1 tablet Oral Daily   nicotine  14 mg Transdermal Q24H   pantoprazole  40 mg Oral Daily   senna-docusate  1 tablet Oral BID   thiamine  100 mg Oral Daily   Continuous Infusions:   LOS: 4 days     Cordelia Poche, MD [facility] 04/27/2021, 5:49 PM  If 7PM-7AM, please contact night-coverage [email or web address]

## 2021-04-28 ENCOUNTER — Other Ambulatory Visit: Payer: Self-pay | Admitting: Nurse Practitioner

## 2021-04-28 ENCOUNTER — Encounter (HOSPITAL_COMMUNITY): Payer: Self-pay | Admitting: Internal Medicine

## 2021-04-28 ENCOUNTER — Inpatient Hospital Stay (HOSPITAL_COMMUNITY): Payer: Medicare Other

## 2021-04-28 ENCOUNTER — Ambulatory Visit
Admit: 2021-04-28 | Discharge: 2021-04-28 | Disposition: A | Discharge status: Home / Self Care | Payer: Medicare Other | Attending: Radiation Oncology | Admitting: Radiation Oncology

## 2021-04-28 DIAGNOSIS — M8448XA Pathological fracture, other site, initial encounter for fracture: Secondary | ICD-10-CM | POA: Diagnosis not present

## 2021-04-28 DIAGNOSIS — C7951 Secondary malignant neoplasm of bone: Secondary | ICD-10-CM

## 2021-04-28 DIAGNOSIS — Z515 Encounter for palliative care: Secondary | ICD-10-CM

## 2021-04-28 DIAGNOSIS — R911 Solitary pulmonary nodule: Secondary | ICD-10-CM | POA: Diagnosis not present

## 2021-04-28 DIAGNOSIS — F332 Major depressive disorder, recurrent severe without psychotic features: Secondary | ICD-10-CM

## 2021-04-28 DIAGNOSIS — M549 Dorsalgia, unspecified: Secondary | ICD-10-CM | POA: Diagnosis not present

## 2021-04-28 MED ORDER — FENTANYL CITRATE (PF) 100 MCG/2ML IJ SOLN
INTRAMUSCULAR | Status: AC
  Filled 2021-04-28: qty 2

## 2021-04-28 MED ORDER — FENTANYL CITRATE (PF) 100 MCG/2ML IJ SOLN
INTRAMUSCULAR | Status: AC | PRN
Start: 2021-04-28 — End: 2021-04-28
  Administered 2021-04-28 (×2): 50 ug via INTRAVENOUS

## 2021-04-28 MED ORDER — MIDAZOLAM HCL 2 MG/2ML IJ SOLN
INTRAMUSCULAR | Status: AC | PRN
  Administered 2021-04-28: 2 mg via INTRAVENOUS
  Administered 2021-04-28: 1 mg via INTRAVENOUS

## 2021-04-28 MED ORDER — SENNOSIDES-DOCUSATE SODIUM 8.6-50 MG PO TABS
1.0000 | ORAL_TABLET | Freq: Two times a day (BID) | ORAL | Status: DC | PRN
  Administered 2021-04-30 – 2021-05-06 (×6): 1 via ORAL
  Filled 2021-04-28 (×7): qty 1

## 2021-04-28 MED ORDER — LOSARTAN POTASSIUM 25 MG PO TABS
25.0000 mg | ORAL_TABLET | Freq: Every day | ORAL | Status: DC
  Administered 2021-04-29 – 2021-05-06 (×8): 25 mg via ORAL
  Filled 2021-04-28 (×8): qty 1

## 2021-04-28 MED ORDER — MIDAZOLAM HCL 2 MG/2ML IJ SOLN
INTRAMUSCULAR | Status: AC
  Filled 2021-04-28: qty 4

## 2021-04-28 NOTE — Plan of Care (Signed)

## 2021-04-28 NOTE — Procedures (Signed)
Interventional Radiology Procedure Note  Procedure: Repeat CT bx of T12 metastatic lesion  Complications: None  Estimated Blood Loss: None  Recommendations: - Return to floor - Path sent  Signed,  Criselda Peaches, MD

## 2021-04-28 NOTE — TOC Initial Note (Signed)
Transition of Care [facility]) - Initial/Assessment Note    Patient Details  Name: Christopher Dawson MRN: [id number] Date of Birth: May 11, 1955  Transition of Care [facility] CM/SW Contact:    Lennart Pall, LCSW Phone Number: 04/28/2021, 12:39 PM  Clinical Narrative:                 Met with pt today to discuss dc plans and PT recommendations for SNF.  Pt engaged, however, admits frustration with his overall situation.  He is reluctant to agree to SNF plan as he says he needs to get back to his home as soon as possible to "clear things out so it can be sold when I die."  He does express understanding of therapy concerns for his safety if alone in the home and is agreed to consider short term SNF.  At this point, will begin SNF placement process.  Will need to confirm any Cancer treatment planning as he may need to have access to transportation while in SNF.  Expected Discharge Plan: Skilled Nursing Facility Barriers to Discharge: Continued Medical Work up   Patient Goals and CMS Choice Patient states their goals for this hospitalization and ongoing recovery are:: to only go to rehab for a short stay and then return home      Expected Discharge Plan and Services Expected Discharge Plan: [place] In-house Referral: Clinical Social Work   Post Acute Care Choice: [place] Living arrangements for the past 2 months: [place]                                      Prior Living Arrangements/Services Living arrangements for the past 2 months: Single Family Home Lives with:: Self Patient language and need for interpreter reviewed:: Yes Do you feel safe going back to the place where you live?: Yes      Need for Family Participation in Patient Care: Yes (Comment) Care giver support system in place?: No (comment)   Criminal Activity/Legal Involvement Pertinent to Current Situation/Hospitalization: No - Comment as needed  Activities of Daily Living Home  Assistive Devices/Equipment: Cane (specify quad or straight), Walker (specify type) ADL Screening (condition at time of admission) Patient's cognitive ability adequate to safely complete daily activities?: Yes Is the patient deaf or have difficulty hearing?: No Does the patient have difficulty seeing, even when wearing glasses/contacts?: No Does the patient have difficulty concentrating, remembering, or making decisions?: No Patient able to express need for assistance with ADLs?: Yes Does the patient have difficulty dressing or bathing?: Yes Independently performs ADLs?: Yes (appropriate for developmental age) Does the patient have difficulty walking or climbing stairs?: Yes Weakness of Legs: Both Weakness of Arms/Hands: None  Permission Sought/Granted Permission sought to share information with : Facility Sport and exercise psychologist                Emotional Assessment Appearance:: Appears stated age Attitude/Demeanor/Rapport: Complaining, Guarded Affect (typically observed): Frustrated Orientation: : Oriented to Self, Oriented to Place, Oriented to  Time, Oriented to Situation Alcohol / Substance Use: Not Applicable Psych Involvement: No (comment)  Admission diagnosis:  Intractable back pain [M54.9] Patient Active Problem List   Diagnosis Date Noted   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   Intractable back pain 04/23/2021   Elevated BP without diagnosis of hypertension 11/25/2020   Poor social situation 11/25/2020   Alcohol withdrawal syndrome without complication (HCC)    H/O alcohol  abuse 05/09/2017   Hypocalcemia 05/09/2017   Leukopenia 05/09/2017   Thrombocytopenia ([place]) 05/09/2014   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

## 2021-04-28 NOTE — Progress Notes (Addendum)
IP PROGRESS NOTE  Subjective:   Had worsening back pain when sitting up in recliner.  Otherwise, pain has been controlled.  Bowels are moving.  He has developed loose stool secondary to laxatives.  No difficulty with bladder control.  Currently n.p.o. awaiting repeat biopsy by IR.  Objective: Vital signs in last 24 hours: Blood pressure (!) 156/79, pulse (!) 106, temperature 98.5 F (36.9 C), temperature source Oral, resp. rate 18, height 6\' 3"  (1.905 m), weight 67.2 kg, SpO2 97 %.  Intake/Output from previous day: 01/04 0701 - 01/05 0700 In: 360 [P.O.:360] Out: 1210 [Urine:1210]  Physical Exam:  HEENT: No thrush Abdomen: Soft and nontender, no hepatosplenomegaly Extremities: No leg edema Neurologic: The motor exam appears intact in the legs and feet bilaterally   Lab Results: No results for input(s): WBC, HGB, HCT, PLT in the last 72 hours.   BMET No results for input(s): NA, K, CL, CO2, GLUCOSE, BUN, CREATININE, CALCIUM in the last 72 hours.   No results found for: CEA1, CEA, [id number], CA125  Studies/Results: CT BIOPSY  Result Date: 04/26/2021 INDICATION: 66 year old with left lung lesion and bone lesions. Findings are concerning for metastatic disease and needs a tissue diagnosis. EXAM: CT-GUIDED BIOPSY OF PARASPINAL LESION Physician: Stephan Minister. Henn, MD MEDICATIONS: Moderate sedation ANESTHESIA/SEDATION: Moderate (conscious) sedation was employed during this procedure. A total of Versed 2.0mg  and fentanyl 100 mcg was administered intravenously at the order of the provider performing the procedure. Total intra-service moderate sedation time: 28 minutes. Patient's level of consciousness and vital signs were monitored continuously by radiology nurse throughout the procedure under the supervision of the provider performing the procedure. COMPLICATIONS: None immediate. PROCEDURE: The procedure was explained to the patient. The risks and benefits of the procedure were discussed and the  patient's questions were addressed. Informed consent was obtained from the patient. The patient was placed prone on CT table. Images at the thoracolumbar spine were obtained. The left paraspinal soft tissue at T12-L1 was identified and targeted. The overlying skin was prepped with chlorhexidine and sterile field was created. Skin was anesthetized using 1% lidocaine. Small incision was made. Using CT guidance, 17 gauge coaxial needle was directed along the lateral aspect of the left paraspinal tissue. A single core biopsy obtained with an 18 gauge device. Specimen placed in formalin. There was brisk bleeding from the coaxial needle following the single core biopsy. Stylet was replaced within the coaxial needle and checked for bleeding periodically by removing the stylet. There continued to be some bleeding after a few minutes. Needle was slowly withdrawn until the bleeding appeared to stop. There was concern for a small hematoma developing under the skin. As a result, the needle was completely removed and manual compression was placed for approximately 5 minutes. Follow up CT images were obtained. Bandage placed over the puncture site. FINDINGS: Destructive lesion involving the T12 vertebral body with abnormal soft tissue along the left paraspinal region at T12-L1. The biopsy needle was placed along the lateral aspect of this abnormal paraspinal tissue. Single core biopsy was obtained. Brisk bleeding from the 17 gauge coaxial needle following the core biopsy. Due to the bleeding and concern for a small soft tissue hematoma, no additional core biopsies were obtained. IMPRESSION: CT-guided core biopsy of the left paraspinal lesion at the level of T12-L1. Electronically Signed   By: Markus Daft M.D.   On: 04/26/2021 14:44    Medications: I have reviewed the patient's current medications.  Assessment/Plan:  Left lung mass, small left  hilar lymph nodes Destructive lesion involving T12 with epidural extension, T9  lytic lesion Biopsy of T12 paraspinal soft tissue 04/26/2021-nondiagnostic Palliative radiation to T9 and T12 04/27/2021 Right adrenal mass Pain secondary to #2 Leg weakness secondary to #2 and deconditioning Marfan syndrome Depression Tobacco and alcohol use  Mr. Saye appears stable.  Biopsy of T12 lesion nondiagnostic.  Repeat CT-guided biopsy planned by IR today.  Clinically and radiographically, he does not have evidence of cord compression.  He has been seen by radiation oncology and radiation has been started to T9 and T12. 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  We will discuss systemic treatment options with the patient once biopsy results are available.  Pain is adequately controlled at this time we will continue current pain medications.  We will change Senokot to as needed given that he has developed loose stools.  Recommendations: Repeat biopsy of the T12 lesion for pathology and molecular testing Continue Decadron, can convert to oral Decadron, taper per Dr. Tammi Klippel Continue current pain medication Palliative radiation to T9 and T12 per Dr. Tammi Klippel Change Senokot to as needed Increase ambulation as tolerated Please call Oncology as needed, we will follow-up on the biopsy result and recommend treatment options Transition of care consultation to consider need for skilled nursing facility placement versus home with home health   LOS: 5 days   Mikey Bussing, NP   04/28/2021, 7:53 AM  Mr. Aymond was interviewed and examined.  He appears unchanged.  Leg strength appears stable.  The biopsy of the paraspinous mass on 04/26/2021 was nondiagnostic.  Interventional radiology plans a repeat biopsy.  He has started palliative radiation to the spine.  Outpatient follow-up will be scheduled in medical oncology.  I was present greater than 50% of today's visit.  I informed medical decision making.

## 2021-04-28 NOTE — Evaluation (Signed)
Occupational Therapy Evaluation Patient Details Name: Christopher Dawson MRN: [id number] DOB: 06-Jan-1956 Today's Date: 04/28/2021   History of Present Illness 66 y.o. male with PMH of anxiety/depression, chronic lumbar radiculopathy, tobacco abuse, Marfans, alcohol abuse. Pt verbally reports h/o cerebral palsy with 2 childhood surgeries "so I wouldn't walk on my toes."  Pt presented with  ongoing physical deconditioning for the last 6 months using cane/walker at home presented with intractable back pain and inability to ambulate, or get up to cook his meals or by groceries. Complains of constipation no urinary issues.  In the ED -CT scan-T12 and T9 vertebra process/destruction with concern for metastatic malignant lesions. 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   Clinical Impression   Christopher Dawson is a 82 year man who presents with above medical history. On evaluation he demonstrates good upper body strength but decreased activity tolerance and balance and significant amount of pain. Today on evaluation patient only able transfer to side of bed - sit for 30 seconds - before return to supine. Patient appears distracted and overwhelmed by multiple factors - diagnosis, needing new shoes, pain, and potentially being discharged tomorrow (his reports). Therapist educated patient on needed DME and physical abilities in order to return home and therapist recommend short term rehab at discharge. Intention was to introduce patient to ADL kit but patient distracted by NPO status and potential biopsy. Therapist in room for increased amount of time as patient spoke and asked questions about random other topics. Patient will benefit from skilled OT services while in hospital to improve deficits and learn compensatory strategies as needed in order to return to be modified independent. Do agree that patient would benefit from a wheelchair.        Recommendations for follow up therapy are one component of a  multi-disciplinary discharge planning process, led by the attending physician.  Recommendations may be updated based on patient status, additional functional criteria and insurance authorization.   Follow Up Recommendations  Skilled nursing-short term rehab (<3 hours/day)    Assistance Recommended at Discharge Frequent or constant Supervision/Assistance  Patient can return home with the following A little help with walking and/or transfers;A lot of help with bathing/dressing/bathroom;Assistance with cooking/housework    Functional Status Assessment  Patient has had a recent decline in their functional status and demonstrates the ability to make significant improvements in function in a reasonable and predictable amount of time.  Equipment Recommendations  BSC/3in1    Recommendations for Other Services       Precautions / Restrictions Precautions Precautions: Fall Precaution Comments: reports 3 falls in past 6 months, all occurred at curb to enter his condo Restrictions Weight Bearing Restrictions: No      Mobility Bed Mobility Overal bed mobility: Modified Independent             General bed mobility comments: Able to transfer himself with use of bed rails. Able to tolerate approx 30 seconds at edge of bed before returning himself to edge of bed.    Transfers                          Balance Overall balance assessment: Needs assistance   Sitting balance-Leahy Scale: Fair Sitting balance - Comments: upper extremity propping due to pain                                   ADL either  performed or assessed with clinical judgement   ADL Overall ADL's : Needs assistance/impaired Eating/Feeding: Bed level;Set up   Grooming: Set up;Bed level   Upper Body Bathing: Set up;Bed level   Lower Body Bathing: Maximal assistance;Bed level Lower Body Bathing Details (indicate cue type and reason): feet/nails appear unkempt - reports difficulty reaching  feet at baseline. Upper Body Dressing : Set up;Bed level   Lower Body Dressing: Maximal assistance;Bed level       Toileting- Clothing Manipulation and Hygiene: Moderate assistance Toileting - Clothing Manipulation Details (indicate cue type and reason): able to use urinal, needs assist for perianal care             Vision Patient Visual Report: No change from baseline       Perception     Praxis      Pertinent Vitals/Pain Pain Assessment: 0-10 Pain Score: 7  Pain Location: back Pain Descriptors / Indicators: Constant Pain Intervention(s): Limited activity within patient's tolerance     Hand Dominance Right   Extremity/Trunk Assessment Upper Extremity Assessment Upper Extremity Assessment: Overall WFL for tasks assessed   Lower Extremity Assessment Lower Extremity Assessment: Defer to PT evaluation   Cervical / Trunk Assessment Cervical / Trunk Assessment: Normal   Communication Communication Communication: No difficulties   Cognition Arousal/Alertness: Awake/alert Behavior During Therapy: WFL for tasks assessed/performed Overall Cognitive Status: Within Functional Limits for tasks assessed                                 General Comments: His cognition is functional. He does distracted by multiple factors - including potentially discharging too soon, pain, getting the biopsy, being NPO,     General Comments       Exercises     Shoulder Instructions      Home Living Family/patient expects to be discharged to:: Private residence Living Arrangements: Alone   Type of Home: Apartment Home Access: Stairs to enter [employer] of Steps: 1 curb   Home Layout: One level     Bathroom Shower/Tub: Teacher, early years/pre: Standard     Home Equipment: Conservation officer, nature (2 wheels);Cane - single point   Additional Comments: Walked with [facility] PTA. Also has rolling walker (it won't fold up, per pt). Reports 3 falls in past 6  months, all occurred at curb at his condo unit. Reports shower nozzle in his bathroom doesn't work. Reports he has no help available. Christopher Dawson is 35, Christopher Dawson in [place] go live with them".)      Prior Functioning/Environment Prior Level of Function : Independent/Modified Independent             Mobility Comments: walked with cane PTA ADLs Comments: seems he was not able to perform self care well, his shoes reek of urine and are very dirty, feet are covered in scaly dry skin        OT Problem List: Decreased activity tolerance;Impaired balance (sitting and/or standing);Pain;Decreased knowledge of use of DME or AE;Decreased knowledge of precautions      OT Treatment/Interventions: Self-care/ADL training;DME and/or AE instruction;Therapeutic activities;Balance training;Patient/family education    OT Goals(Current goals can be found in the care plan section) Acute Rehab OT Goals Patient Stated Goal: to have less pain with activity OT Goal Formulation: With patient Time For Goal Achievement: 05/12/21 Potential to Achieve Goals: Good  OT Frequency: Min 2X/week    Co-evaluation  AM-PAC OT "6 Clicks" Daily Activity     Outcome Measure Help from another person eating meals?: None Help from another person taking care of personal grooming?: A Little Help from another person toileting, which includes using toliet, bedpan, or urinal?: A Lot Help from another person bathing (including washing, rinsing, drying)?: A Lot Help from another person to put on and taking off regular upper body clothing?: A Little Help from another person to put on and taking off regular lower body clothing?: A Lot 6 Click Score: 16   End of Session    Activity Tolerance: Patient limited by pain Patient left: in bed;with call bell/phone within reach  OT Visit Diagnosis: Other abnormalities of gait and mobility (R26.89);Pain                Time: 6712-4580 OT Time Calculation (min):  27 min Charges:  OT General Charges $OT Visit: 1 Visit OT Evaluation $OT Eval Low Complexity: 1 Low  Felicity Penix, OTR/L [place]  Office [phone] Pager: [place] 04/28/2021, 10:03 AM

## 2021-04-28 NOTE — Plan of Care (Signed)
°  Problem: Education: Goal: Knowledge of General Education information will improve Description: Including pain rating scale, medication(s)/side effects and non-pharmacologic comfort measures Outcome: Progressing   Problem: Coping: Goal: Level of anxiety will decrease Outcome: Progressing   Problem: Pain Managment: Goal: General experience of comfort will improve Outcome: [place], RN 04/28/21 10:57 AM

## 2021-04-28 NOTE — Progress Notes (Signed)
PROGRESS NOTE    Christopher Dawson  IZT:[id number] DOB: 1955/08/07 DOA: 04/23/2021 PCP: [name], MD   Brief Narrative: Christopher Dawson is a 66 y.o. male with a history of anxiety, depression, chronic lumbar radiculopathy, tobacco abuse, alcohol abuse. Patient presented secondary to intractable back pain and inability to ambulate. 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 Radiation therapy initiated   Assessment & Plan:   Intractable back pain Secondary to thoracic and lumbar spine lesions concerning for metastasis with associated T12 destructive lesion and associated pathologic fracture/epidural tumor confirmed on MRI. Neurosurgery consulted with recommendations for no need for decompressive surgery. Radiation oncology consulted and have initiated radiation therapy. -Palliative care recommendations: Oxycodone, Decadron  Left lung mass Concern for possible malignancy with metastasis. CT guided biopsy (1/3) performed of left paraspinal lesions at level of T12-L1 with inconclusive pathology. Medical oncology consulted and are on board. Plan for repeat biopsy (1/5)  Right adrenal mass Indeterminate. Possible metastasis with recommendation for PET-CT.  Tobacco use -Continue nicotine patch  Depression -Continue clomipramine  Alcohol abuse Cessation discussed. Not currently in withdrawal. -Continue thiamine, folate  Underweight Body mass index is 18.52 kg/m.[name] seen by dietitian. -Dietitian recommendations (04/25/21): continue Ensure Enlive but will increase from BID to TID, each supplement provides 350 kcal and 20 grams of protein. complete NFPE when feasible.    DVT prophylaxis: Heparin subq Code Status:   Code Status: Partial Code Family Communication: None at bedside Disposition Plan: Discharge to home with home health likely in 24 hours pending CT guided  biopsy/medical oncology recommendations   Consultants:  Medical oncology Radiation oncology Neurosurgery Palliative care medicine  Procedures:  RADIATION THERAPY (1/4 >>  Antimicrobials: None    Subjective: No issues this morning. Declines transfer to SNF and would like to go home when ready for discharge.  Objective: Vitals:   04/27/21 0850 04/27/21 1400 04/27/21 2249 04/28/21 0643  BP: (!) 146/93 (!) 151/85 106/88 (!) 156/79  Pulse: (!) 104 100 (!) 103 (!) 106  Resp: 16 19 18 18   Temp: 97.7 F (36.5 C) 98.3 F (36.8 C) 98.5 F (36.9 C) 98.5 F (36.9 C)  TempSrc: Oral Oral Oral Oral  SpO2: 94% 96% 94% 97%  Weight:      Height:        Intake/Output Summary (Last 24 hours) at 04/28/2021 0915 Last data filed at 04/28/2021 0657 Gross per 24 hour  Intake 360 ml  Output 790 ml  Net -430 ml    Filed Weights   04/23/21 2343  Weight: 67.2 kg    Examination:  General exam: Appears calm and comfortable Respiratory system: Clear to auscultation. Respiratory effort normal. Cardiovascular system: S1 & S2 heard, RRR. No murmurs, rubs, gallops or clicks. Gastrointestinal system: Abdomen is nondistended, soft and nontender. No organomegaly or masses felt. Normal bowel sounds heard. Central nervous system: Alert and oriented. No focal neurological deficits. Musculoskeletal: No edema. No calf tenderness Skin: No cyanosis. No rashes Psychiatry: Judgement and insight appear normal. Mood & affect appropriate.     Data Reviewed: I have personally reviewed following labs and imaging studies  CBC Lab Results  Component Value Date   WBC 15.8 (H) 04/25/2021   RBC 4.30 04/25/2021   HGB 13.0 04/25/2021   HCT 39.1 04/25/2021   MCV 90.9 04/25/2021   MCH 30.2 04/25/2021   PLT 339 04/25/2021   MCHC 33.2 04/25/2021   RDW 12.3 04/25/2021  LYMPHSABS 1.6 04/23/2021   MONOABS 0.9 04/23/2021   EOSABS 0.1 04/23/2021   BASOSABS 0.0 33/82/5053     Last metabolic panel Lab  Results  Component Value Date   NA 139 04/25/2021   K 3.8 04/25/2021   CL 110 04/25/2021   CO2 25 04/25/2021   BUN 19 04/25/2021   CREATININE 0.53 (L) 04/25/2021   GLUCOSE 165 (H) 04/25/2021   GFRNONAA >60 04/25/2021   GFRAA >60 06/29/2019   CALCIUM 8.8 (L) 04/25/2021   PHOS 3.1 04/23/2021   PROT 6.5 04/24/2021   ALBUMIN 3.1 (L) 04/24/2021   LABGLOB 1.9 07/04/2018   AGRATIO 2.5 (H) 07/04/2018   BILITOT 0.7 04/24/2021   ALKPHOS 68 04/24/2021   AST 13 (L) 04/24/2021   ALT 15 04/24/2021   ANIONGAP 4 (L) 04/25/2021    CBG (last 3)  No results for input(s): GLUCAP in the last 72 hours.   GFR: Estimated Creatinine Clearance: 86.3 mL/min (A) (by C-G formula based on SCr of 0.53 mg/dL (L)).  Coagulation Profile: No results for input(s): INR, PROTIME in the last 168 hours.  Recent Results (from the past 240 hour(s))  Resp Panel by RT-PCR (Flu A&B, Covid) Nasopharyngeal Swab     Status: None   Collection Time: 04/23/21  8:05 PM   Specimen: Nasopharyngeal Swab; Nasopharyngeal(NP) swabs in vial transport medium  Result Value Ref Range Status   SARS Coronavirus 2 by RT PCR NEGATIVE NEGATIVE Final    Comment: (NOTE) SARS-CoV-2 target nucleic acids are NOT DETECTED.  The SARS-CoV-2 RNA is generally detectable in upper respiratory specimens during the acute phase of infection. The lowest concentration of SARS-CoV-2 viral copies this assay can detect is 138 copies/mL. A negative result does not preclude SARS-Cov-2 infection and should not be used as the sole basis for treatment or other patient management decisions. A negative result may occur with  improper specimen collection/handling, submission of specimen other than nasopharyngeal swab, presence of viral mutation(s) within the areas targeted by this assay, and inadequate number of viral copies(<138 copies/mL). A negative result must be combined with clinical observations, patient history, and epidemiological information. The  expected result is Negative.  Fact Sheet for Patients:  EntrepreneurPulse.com.au  Fact Sheet for Healthcare Providers:  IncredibleEmployment.be  This test is no t yet approved or cleared by the [place] FDA and  has been authorized for detection and/or diagnosis of SARS-CoV-2 by FDA under an Emergency Use Authorization (EUA). This EUA will remain  in effect (meaning this test can be used) for the duration of the COVID-19 declaration under Section 564(b)(1) of the Act, 21 U.S.C.section 360bbb-3(b)(1), unless the authorization is terminated  or revoked sooner.       Influenza A by PCR NEGATIVE NEGATIVE Final   Influenza B by PCR NEGATIVE NEGATIVE Final    Comment: (NOTE) The Xpert Xpress SARS-CoV-2/FLU/RSV plus assay is intended as an aid in the diagnosis of influenza from Nasopharyngeal swab specimens and should not be used as a sole basis for treatment. Nasal washings and aspirates are unacceptable for Xpert Xpress SARS-CoV-2/FLU/RSV testing.  Fact Sheet for Patients: EntrepreneurPulse.com.au  Fact Sheet for Healthcare Providers: IncredibleEmployment.be  This test is not yet approved or cleared by the [place] FDA and has been authorized for detection and/or diagnosis of SARS-CoV-2 by FDA under an Emergency Use Authorization (EUA). This EUA will remain in effect (meaning this test can be used) for the duration of the COVID-19 declaration under Section 564(b)(1) of the Act, 21 U.S.C. section  360bbb-3(b)(1), unless the authorization is terminated or revoked.  Performed at [facility], [place] [address], [place], [place] [address]         Radiology Studies: CT BIOPSY  Result Date: 05/02/21 INDICATION: 66 year old with left lung lesion and bone lesions. Findings are concerning for metastatic disease and needs a tissue diagnosis. EXAM: CT-GUIDED BIOPSY OF PARASPINAL LESION  Physician: Stephan Minister. Henn, MD MEDICATIONS: Moderate sedation ANESTHESIA/SEDATION: Moderate (conscious) sedation was employed during this procedure. A total of Versed 2.0mg  and fentanyl 100 mcg was administered intravenously at the order of the provider performing the procedure. Total intra-service moderate sedation time: 28 minutes. Patient's level of consciousness and vital signs were monitored continuously by radiology nurse throughout the procedure under the supervision of the provider performing the procedure. COMPLICATIONS: None immediate. PROCEDURE: The procedure was explained to the patient. The risks and benefits of the procedure were discussed and the patient's questions were addressed. Informed consent was obtained from the patient. The patient was placed prone on CT table. Images at the thoracolumbar spine were obtained. The left paraspinal soft tissue at T12-L1 was identified and targeted. The overlying skin was prepped with chlorhexidine and sterile field was created. Skin was anesthetized using 1% lidocaine. Small incision was made. Using CT guidance, 17 gauge coaxial needle was directed along the lateral aspect of the left paraspinal tissue. A single core biopsy obtained with an 18 gauge device. Specimen placed in formalin. There was brisk bleeding from the coaxial needle following the single core biopsy. Stylet was replaced within the coaxial needle and checked for bleeding periodically by removing the stylet. There continued to be some bleeding after a few minutes. Needle was slowly withdrawn until the bleeding appeared to stop. There was concern for a small hematoma developing under the skin. As a result, the needle was completely removed and manual compression was placed for approximately 5 minutes. Follow up CT images were obtained. Bandage placed over the puncture site. FINDINGS: Destructive lesion involving the T12 vertebral body with abnormal soft tissue along the left paraspinal region at  T12-L1. The biopsy needle was placed along the lateral aspect of this abnormal paraspinal tissue. Single core biopsy was obtained. Brisk bleeding from the 17 gauge coaxial needle following the core biopsy. Due to the bleeding and concern for a small soft tissue hematoma, no additional core biopsies were obtained. IMPRESSION: CT-guided core biopsy of the left paraspinal lesion at the level of T12-L1. Electronically Signed   By: Markus Daft M.D.   On: 05-02-21 14:44        Scheduled Meds:  clomiPRAMINE  100 mg Oral QHS   dexamethasone (DECADRON) injection  4 mg Intravenous Q6H   feeding supplement  237 mL Oral TID BM   folic acid  1 mg Oral Daily   heparin injection (subcutaneous)  5,000 Units Subcutaneous Q8H   multivitamin with minerals  1 tablet Oral Daily   nicotine  14 mg Transdermal Q24H   pantoprazole  40 mg Oral Daily   senna-docusate  1 tablet Oral BID   thiamine  100 mg Oral Daily   Continuous Infusions:   LOS: 5 days     Cordelia Poche, MD [facility] 04/28/2021, 9:15 AM  If 7PM-7AM, please contact night-coverage [email or web address]

## 2021-04-28 NOTE — Consult Note (Signed)
Chief Complaint: Patient was seen in consultation today for image guided biopsy  Chief Complaint  Patient presents with   Back Pain   at the request of Leodis Rains   Referring Physician(s): Leodis Rains   Supervising Physician: Jacqulynn Cadet  Patient Status: [facility] - In-pt  History of Present Illness: Ebony Rickel is a 66 y.o. male with PMHs of Marfan syndrome, lumbar radiculopathy, depression, anxiety, alcohol abuse who was brought to ED via EMS for evaluation of back pain on 04/23/21.  Workup has found evidence concerning for metastatic process, including lung lesion with adenopathy. There is also destructive T12 vertebral lesion with extension into the adjacent paraspinous soft tissue.  Medical and radiation oncology have been consulted and patient underwent image guided left paraspinal soft tissue mass with IR on 04/26/21. The procedure was aborted after obtaining one core sample due to bleeding. Pathology revealed no malignant cells, oncology team has asked IR to repeat biopsy.   Case was reviewed and approved for repeat CT guided biopsy of the left T12-L1 soft tissue mass.   Patient laying in bed, not in acute distress.  Denise headache, fever, chills, shortness of breath, cough, chest pain, abdominal pain, nausea ,vomiting, and bleeding.   Past Medical History:  Diagnosis Date   Anxiety    Depression    Lumbar radiculopathy, chronic 2008   Marfan syndrome    Neurosis, depressive    Shortening, leg, congenital     Past Surgical History:  Procedure Laterality Date   LEG SURGERY      Allergies: Codeine  Medications: Prior to Admission medications   Medication Sig Start Date End Date Taking? Authorizing Provider  clomiPRAMINE (ANAFRANIL) 50 MG capsule TAKE 1 CAPSULE(50 MG) BY MOUTH AT BEDTIME Patient taking differently: Take 100 mg by mouth at bedtime. 03/22/21  Yes [name], MD  Multiple Vitamins-Minerals (CENTRUM SILVER 50+MEN) TABS Take 1  tablet by mouth daily with breakfast.   Yes [provider]  clomiPRAMINE (ANAFRANIL) 25 MG capsule May take one to two tablets as needed at bedtime. Patient not taking: Reported on 04/24/2021 12/07/20   [name], MD     Family History  Problem Relation Age of Onset   Heart disease Mother     Social History   Socioeconomic History   Marital status: Single    Spouse name: Not on file   Number of children: Not on file   Years of education: Not on file   Highest education level: Not on file  Occupational History   Not on file  Tobacco Use   Smoking status: Every Day    Types: Cigarettes   Smokeless tobacco: Never  Vaping Use   Vaping Use: Never used  Substance and Sexual Activity   Alcohol use: Yes    Alcohol/week: 1.0 standard drink    Types: 1 Cans of beer per week    Comment: 3-4 beers daily   Drug use: No   Sexual activity: Not Currently  Other Topics Concern   Not on file  Social History Narrative   Not on file   Social Determinants of Health   Financial Resource Strain: Not on file  Food Insecurity: Not on file  Transportation Needs: Not on file  Physical Activity: Not on file  Stress: Not on file  Social Connections: Not on file     Review of Systems: A 12 point ROS discussed and pertinent positives are indicated in the HPI above.  All other systems are negative.  Vital Signs: BP (!) 144/82 (BP Location: Right Arm)    Pulse 92    Temp 98.4 F (36.9 C) (Oral)    Resp 18    Ht 6\' 3"  (1.905 m)    Wt 148 lb 2.4 oz (67.2 kg)    SpO2 95%    BMI 18.52 kg/m    Physical Exam Vitals reviewed.  Constitutional:      General: He is not in acute distress.    Appearance: He is ill-appearing.  HENT:     Head: Normocephalic and atraumatic.  Cardiovascular:     Rate and Rhythm: Normal rate and regular rhythm.  Pulmonary:     Effort: Pulmonary effort is normal.     Breath sounds: Normal breath sounds.  Abdominal:     General: Abdomen is flat.  Bowel sounds are normal.     Palpations: Abdomen is soft.  Skin:    General: Skin is warm and dry.     Coloration: Skin is not jaundiced or pale.  Neurological:     Mental Status: He is alert and oriented to person, place, and time.  Psychiatric:        Mood and Affect: Mood normal.        Behavior: Behavior normal.        Judgment: Judgment normal.    MD Evaluation Airway: WNL Heart: WNL Abdomen: WNL Chest/ Lungs: WNL ASA  Classification: 3 Mallampati/Airway Score: One  Imaging: DG Lumbar Spine Complete  Result Date: 04/23/2021 CLINICAL DATA:  Back pain. EXAM: LUMBAR SPINE - COMPLETE 4+ VIEW COMPARISON:  None. FINDINGS: There is dextroconvex curvature of the lumbar spine. There is no evidence for acute fracture or dislocation of the lumbar spine. There is questionable mild compression deformity of T12, age indeterminate. Mild degenerative endplate osteophytes are seen throughout the lumbar spine. Disc spaces are grossly maintained. Soft tissues are within normal limits. IMPRESSION: 1. Questionable mild compression deformity of T12, age indeterminate. Correlate for point tenderness. Consider dedicated imaging of this level. 2. Levoconvex curvature of the lumbar spine. 3. Mild degenerative changes. Electronically Signed   By: Ronney Asters M.D.   On: 04/23/2021 16:55   CT Thoracic Spine Wo Contrast  Result Date: 04/23/2021 CLINICAL DATA:  Worsening chronic back pain. EXAM: CT THORACIC AND LUMBAR SPINE WITHOUT CONTRAST TECHNIQUE: Multidetector CT imaging of the thoracic and lumbar spine was performed without contrast. Multiplanar CT image reconstructions were also generated. COMPARISON:  Lumbar spine radiographs 04/23/2021 FINDINGS: CT THORACIC SPINE FINDINGS Alignment: Exaggerated upper thoracic kyphosis.  No listhesis. Vertebrae: Permeative lytic destructive lesion of the vertebral body and left-sided posterior elements at T12 with associated pathologic fracture and mild vertebral body  height loss. 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 2 cm lytic lesion in the left lateral aspect of the T9 vertebral body with extraosseous tumor extension. Paraspinal and other soft tissues: Lower thoracic paravertebral soft tissue suggestive of tumor, possibly with superimposed hematoma, extending from T11-L1. Centrilobular and paraseptal emphysema. Disc levels: Mild-to-moderate thoracic spondylosis. At least mild spinal stenosis is suspected at T12 due to epidural tumor. Tumor also encroaches on the left-sided neural foramina at T11-12 and T12-L1. CT LUMBAR SPINE FINDINGS Segmentation: 5 lumbar type vertebrae. Alignment: Trace retrolisthesis of L4 on L5 and L5 on S1. Slight right convex curvature of the upper lumbar spine. Vertebrae: No acute fracture or suspicious osseous lesion. Paraspinal and other soft tissues: Mild abdominal aortic atherosclerosis. Disc levels: Mild disc degeneration, greatest at L4-5  where disc bulging, spurring, and mild facet hypertrophy result in borderline spinal stenosis and mild right lateral recess and right neural foraminal stenosis. IMPRESSION: 1. Destructive lesion involving the T12 vertebral body and left-sided posterior elements with pathologic vertebral body fracture and epidural tumor resulting in at least mild spinal stenosis. Smaller lesion in the T9 vertebral body. Findings are most concerning for metastatic disease. 2. Aortic Atherosclerosis (ICD10-I70.0) and Emphysema (ICD10-J43.9). Electronically Signed   By: Logan Bores M.D.   On: 04/23/2021 18:41   CT Lumbar Spine Wo Contrast  Result Date: 04/23/2021 CLINICAL DATA:  Worsening chronic back pain. EXAM: CT THORACIC AND LUMBAR SPINE WITHOUT CONTRAST TECHNIQUE: Multidetector CT imaging of the thoracic and lumbar spine was performed without contrast. Multiplanar CT image reconstructions were also generated. COMPARISON:  Lumbar spine radiographs 04/23/2021 FINDINGS: CT  THORACIC SPINE FINDINGS Alignment: Exaggerated upper thoracic kyphosis.  No listhesis. Vertebrae: Permeative lytic destructive lesion of the vertebral body and left-sided posterior elements at T12 with associated pathologic fracture and mild vertebral body height loss. 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 2 cm lytic lesion in the left lateral aspect of the T9 vertebral body with extraosseous tumor extension. Paraspinal and other soft tissues: Lower thoracic paravertebral soft tissue suggestive of tumor, possibly with superimposed hematoma, extending from T11-L1. Centrilobular and paraseptal emphysema. Disc levels: Mild-to-moderate thoracic spondylosis. At least mild spinal stenosis is suspected at T12 due to epidural tumor. Tumor also encroaches on the left-sided neural foramina at T11-12 and T12-L1. CT LUMBAR SPINE FINDINGS Segmentation: 5 lumbar type vertebrae. Alignment: Trace retrolisthesis of L4 on L5 and L5 on S1. Slight right convex curvature of the upper lumbar spine. Vertebrae: No acute fracture or suspicious osseous lesion. Paraspinal and other soft tissues: Mild abdominal aortic atherosclerosis. Disc levels: Mild disc degeneration, greatest at L4-5 where disc bulging, spurring, and mild facet hypertrophy result in borderline spinal stenosis and mild right lateral recess and right neural foraminal stenosis. IMPRESSION: 1. Destructive lesion involving the T12 vertebral body and left-sided posterior elements with pathologic vertebral body fracture and epidural tumor resulting in at least mild spinal stenosis. Smaller lesion in the T9 vertebral body. Findings are most concerning for metastatic disease. 2. Aortic Atherosclerosis (ICD10-I70.0) and Emphysema (ICD10-J43.9). Electronically Signed   By: Logan Bores M.D.   On: 04/23/2021 18:41   MR THORACIC SPINE W WO CONTRAST  Result Date: 04/24/2021 CLINICAL DATA:  Back pain.  Metastatic disease. EXAM: MRI  THORACIC AND LUMBAR SPINE WITHOUT AND WITH CONTRAST TECHNIQUE: Multiplanar and multiecho pulse sequences of the thoracic and lumbar spine were obtained without and with intravenous contrast. CONTRAST:  33mL GADAVIST GADOBUTROL 1 MMOL/ML IV SOLN COMPARISON:  CT thoracic and lumbar spine 04/23/2021 FINDINGS: MRI THORACIC SPINE FINDINGS Alignment: Exaggerated upper thoracic kyphosis. No significant listhesis. Vertebrae: Abnormal T1 hypointensity, T2/STIR hyperintensity, and enhancement throughout the T9 vertebral body including a more focal 2 cm lesion in the left aspect of the vertebral body corresponding to the lytic lesion on CT. 2 cm lesion in the posterior aspect of the T5 vertebral body. Subcentimeter enhancing lesion in the right T11 pars. 1 cm enhancing lesion in the base of the T10 spinous process. Subcentimeter enhancing lesion versus vascular enhancement in the left T10 pedicle. Destructive lesion involving the T12 vertebral body and left-sided posterior elements with pathologic fracture as described on CT with extraosseous tumor encroaching upon the left T11-12 and T12-L1 neural foramina and with ventral and left-sided epidural tumor resulting in mild  spinal stenosis without cord compression. Mild edema and enhancement along the endplates at N2-7, likely degenerative. Cord:  Normal cord signal. 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 Disc levels: Mild spinal stenosis at T12 due to epidural tumor. Mild thoracic spondylosis elsewhere without significant degenerative stenosis. MRI LUMBAR SPINE FINDINGS Segmentation:  Standard. Alignment: Slight right convex curvature of the lumbar spine. Trace retrolisthesis of L4 on L5 and L5 on S1. Vertebrae: Enhancing lesions in the L1 vertebral body and spinous process without evidence of associated epidural tumor subcentimeter enhancing lesion in the right L5 pedicle. No fracture. Conus medullaris: Extends to the L1-2 level and appears normal.  Paraspinal and other soft tissues: Paraspinal tumor at T12 as described above extending to the L1 level. Disc levels: L1-2: Disc desiccation. Mild disc bulging and mild facet hypertrophy without stenosis. L2-3: Minimal disc bulging and mild-to-moderate facet hypertrophy without stenosis. L3-4: Disc desiccation. Disc bulging, a left foraminal disc osteophyte complex, and mild to moderate facet hypertrophy without significant stenosis. L4-5: Disc desiccation and mild-to-moderate disc space narrowing. Disc bulging, a small central disc protrusion, and mild-to-moderate facet and ligamentum flavum hypertrophy result in borderline to mild bilateral lateral recess and right neural foraminal stenosis without spinal stenosis. L5-S1: Disc desiccation and mild-to-moderate disc space narrowing. Disc bulging, a small left central disc protrusion, and mild facet hypertrophy result in borderline to mild left lateral recess stenosis without spinal or neural foraminal stenosis. IMPRESSION: 1. Multiple enhancing thoracic and lumbar spine lesions consistent with metastases. 2. 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 Electronically Signed   By: Logan Bores M.D.   On: 04/24/2021 13:58   MR Lumbar Spine W Wo Contrast  Result Date: 04/24/2021 CLINICAL DATA:  Back pain.  Metastatic disease. EXAM: MRI THORACIC AND LUMBAR SPINE WITHOUT AND WITH CONTRAST TECHNIQUE: Multiplanar and multiecho pulse sequences of the thoracic and lumbar spine were obtained without and with intravenous contrast. CONTRAST:  36mL GADAVIST GADOBUTROL 1 MMOL/ML IV SOLN COMPARISON:  CT thoracic and lumbar spine 04/23/2021 FINDINGS: MRI THORACIC SPINE FINDINGS Alignment: Exaggerated upper thoracic kyphosis. No significant listhesis. Vertebrae: Abnormal T1 hypointensity, T2/STIR hyperintensity, and enhancement throughout the T9 vertebral body  including a more focal 2 cm lesion in the left aspect of the vertebral body corresponding to the lytic lesion on CT. 2 cm lesion in the posterior aspect of the T5 vertebral body. Subcentimeter enhancing lesion in the right T11 pars. 1 cm enhancing lesion in the base of the T10 spinous process. Subcentimeter enhancing lesion versus vascular enhancement in the left T10 pedicle. Destructive lesion involving the T12 vertebral body and left-sided posterior elements with pathologic fracture as described on CT with extraosseous tumor encroaching upon the left T11-12 and T12-L1 neural foramina and with ventral and left-sided epidural tumor resulting in mild spinal stenosis without cord compression. Mild edema and enhancement along the endplates at P8-2, likely degenerative. Cord:  Normal cord signal. 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 Disc levels: Mild spinal stenosis at T12 due to epidural tumor. Mild thoracic spondylosis elsewhere without significant degenerative stenosis. MRI LUMBAR SPINE FINDINGS Segmentation:  Standard. Alignment: Slight right convex curvature of the lumbar spine. Trace retrolisthesis of L4 on L5 and L5 on S1. Vertebrae: Enhancing lesions in the L1 vertebral body and spinous process without evidence of associated epidural tumor subcentimeter enhancing lesion in the right L5 pedicle. No fracture. Conus medullaris: Extends to  the L1-2 level and appears normal. Paraspinal and other soft tissues: Paraspinal tumor at T12 as described above extending to the L1 level. Disc levels: L1-2: Disc desiccation. Mild disc bulging and mild facet hypertrophy without stenosis. L2-3: Minimal disc bulging and mild-to-moderate facet hypertrophy without stenosis. L3-4: Disc desiccation. Disc bulging, a left foraminal disc osteophyte complex, and mild to moderate facet hypertrophy without significant stenosis. L4-5: Disc desiccation and mild-to-moderate disc space narrowing. Disc bulging, a small  central disc protrusion, and mild-to-moderate facet and ligamentum flavum hypertrophy result in borderline to mild bilateral lateral recess and right neural foraminal stenosis without spinal stenosis. L5-S1: Disc desiccation and mild-to-moderate disc space narrowing. Disc bulging, a small left central disc protrusion, and mild facet hypertrophy result in borderline to mild left lateral recess stenosis without spinal or neural foraminal stenosis. IMPRESSION: 1. Multiple enhancing thoracic and lumbar spine lesions consistent with metastases. 2. 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 Electronically Signed   By: Logan Bores M.D.   On: 04/24/2021 13:58   CT CHEST ABDOMEN PELVIS W CONTRAST  Result Date: 04/23/2021 CLINICAL DATA:  Permeative lytic destructive lesion of the T12 vertebral body and left-sided posterior elements with pathologic fracture and mild vertebral height loss, worrisome for metastatic disease. Evaluate for primary. 2 cm lytic lesion left lateral T9 body. EXAM: CT CHEST, ABDOMEN, AND PELVIS WITH CONTRAST TECHNIQUE: Multidetector CT imaging of the chest, abdomen and pelvis was performed following the standard protocol during bolus administration of intravenous contrast. CONTRAST:  24mL OMNIPAQUE IOHEXOL 350 MG/ML SOLN COMPARISON:  CT thoracic and lumbar spine earlier today. FINDINGS: CT CHEST FINDINGS Cardiovascular: The cardiac size is normal. There are scattered calcifications in the LAD coronary artery. Early changes of aortic atherosclerosis. 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 There is no arteriovenous dilatation or central arterial embolus. No pericardial effusion. Mediastinum/Nodes: There are slightly prominent left hilar lymph nodes with a lymph node measuring 1.2 cm in  short axis in the anterior left hilum on series 2 axial 36 and another more posteriorly measuring 1 cm in short axis on axial 41. There is no bulky, encasing or further intrathoracic adenopathy, no mediastinal or axillary mass or thyroid lesions. There is no tracheal or central airway filling defect or significant esophageal thickening. Minimally small hiatal hernia. Lungs/Pleura: No pleural effusion, thickening or pneumothorax. There is a spiculated mass in the left upper lobe laterally at mid field measuring 1.8 x 1.6 x 1.0 cm, with pleural stranding lateral to it. 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 Bilateral apical pleuroparenchymal scar-like opacities are noted, with bilateral paraseptal emphysematous changes in the lungs predominating in the upper lobes, relatively milder centrilobular changes lung apices. There are patchy hazy opacities in the lingula anteriorly which could be chronic change or pneumonitis. There are scattered linear scar-like opacities in the bases. No other nodules or infiltrates are observed. 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 There is left paraspinal tumor extension at the level of the vertebral body as well. A 2 cm T9 vertebral body lytic lesion is again noted to the left anteriorly, with adjacent paraspinal soft tissue thickening  without intracanalicular extension. No other focal bone lesions are seen in the thoracic osseous structures. Asymmetric elevation right hemidiaphragm is also seen. CT ABDOMEN PELVIS FINDINGS Hepatobiliary: Slightly steatotic liver without mass enhancement. The gallbladder and bile ducts unremarkable. Pancreas: Moderately fatty atrophic without mass enhancement or ductal dilatation.  Spleen: Unremarkable. Adrenals/Urinary Tract: Indeterminate 1.5 cm heterogeneous right adrenal nodule. There is no left adrenal mass , no focal renal cortical abnormality. There are either are few small scattered nonobstructive caliceal stones in the kidneys or early contrast excretion mimicking stones. There is no obstructive stone or hydroureteronephrosis no bladder thickening. Stomach/Bowel: The gastric wall is contracted. The unopacified small bowel demonstrating fold thickening in the duodenum and jejunum with suspected fold thickening in the underdistended proximal stomach. There is no small bowel obstruction or inflammation. The appendix is normal. Moderate constipation is seen. Left colonic diverticula without diverticulitis. There is moderate wall thickening versus nondistention in the rectum. Vascular/Lymphatic: Aortic atherosclerosis. No enlarged abdominal or pelvic lymph nodes. Reproductive: Enlarged prostate, 5.5 cm transverse with bladder base impression. Other: There is no free air, hemorrhage or fluid. Small umbilical fat hernia. Musculoskeletal: No regional destructive bone lesions. Moderate left-greater-than-right hip DJD. Degenerative changes and mild dextroscoliosis lumbar spine. IMPRESSION: 1. 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 There are 2 slightly prominent left hilar lymph nodes but no further chest adenopathy. 2. 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 3. Patchy haziness in the anterior lingula which could be chronic change or pneumonitis. 4. Indeterminate heterogeneous 1.5 cm right adrenal mass, possible metastasis. PET-CT may be  helpful. 5. COPD and scarring change. 6. 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 No AAA. 7. Moderate thickening of the rectum versus underdistention. Further evaluation recommended. 8. Enlarged prostate. 9. Constipation, and additional findings suggesting gastroenteritis. No small bowel obstruction or inflammatory changes. 10. No other CT evidence of metastatic disease. Additional findings discussed above. Electronically Signed   By: Telford Nab M.D.   On: 04/23/2021 21:03   CT BIOPSY  Result Date: 04/26/2021 INDICATION: 66 year old with left lung lesion and bone lesions. Findings are concerning for metastatic disease and needs a tissue diagnosis. EXAM: CT-GUIDED BIOPSY OF PARASPINAL LESION Physician: Stephan Minister. Henn, MD MEDICATIONS: Moderate sedation ANESTHESIA/SEDATION: Moderate (conscious) sedation was employed during this procedure. A total of Versed 2.0mg  and fentanyl 100 mcg was administered intravenously at the order of the provider performing the procedure. Total intra-service moderate sedation time: 28 minutes. Patient's level of consciousness and vital signs were monitored continuously by radiology nurse throughout the procedure under the supervision of the provider performing the procedure. COMPLICATIONS: None immediate. PROCEDURE: The procedure was explained to the patient. The risks and benefits of the procedure were discussed and the patient's questions were addressed. Informed consent was obtained from the patient. The patient was placed prone on CT table. Images at the thoracolumbar spine were obtained. The left paraspinal soft tissue at T12-L1 was identified and targeted. The overlying skin was prepped with chlorhexidine and sterile field was created. Skin was anesthetized using 1% lidocaine. Small incision was made. Using CT guidance, 17 gauge coaxial needle was directed along the lateral aspect of the left paraspinal tissue. A single  core  biopsy obtained with an 18 gauge device. Specimen placed in formalin. There was brisk bleeding from the coaxial needle following the single core biopsy. Stylet was replaced within the coaxial needle and checked for bleeding periodically by removing the stylet. There continued to be some bleeding after a few minutes. Needle was slowly withdrawn until the bleeding appeared to stop. There was concern for a small hematoma developing under the skin. As a result, the needle was completely removed and manual compression was placed for approximately 5 minutes. Follow up CT images were obtained. Bandage placed over the puncture site. FINDINGS: Destructive lesion involving the T12 vertebral body with abnormal soft tissue along the left paraspinal region at T12-L1. The biopsy needle was placed along the lateral aspect of this abnormal paraspinal tissue. Single core biopsy was obtained. Brisk bleeding from the 17 gauge coaxial needle following the core biopsy. Due to the bleeding and concern for a small soft tissue hematoma, no additional core biopsies were obtained. IMPRESSION: CT-guided core biopsy of the left paraspinal lesion at the level of T12-L1. Electronically Signed   By: Markus Daft M.D.   On: 04/26/2021 14:44    Labs:  CBC: Recent Labs    04/23/21 1619 04/23/21 2213 04/24/21 0307 04/25/21 0323  WBC 10.6* 8.6 4.5 15.8*  HGB 15.8 13.9 13.7 13.0  HCT 47.3 42.3 41.7 39.1  PLT 376 338 315 339    COAGS: No results for input(s): INR, APTT in the last 8760 hours.  BMP: Recent Labs    04/23/21 1619 04/23/21 2213 04/24/21 0307 04/25/21 0323  NA 136  --  136 139  K 3.7  --  3.9 3.8  CL 102  --  106 110  CO2 25  --  23 25  GLUCOSE 90  --  179* 165*  BUN 19  --  17 19  CALCIUM 9.1  --  9.0 8.8*  CREATININE 0.57* 0.39* 0.48* 0.53*  GFRNONAA >60 >60 >60 >60    LIVER FUNCTION TESTS: Recent Labs    04/24/21 0307  BILITOT 0.7  AST 13*  ALT 15  ALKPHOS 68  PROT 6.5  ALBUMIN 3.1*    TUMOR  MARKERS: No results for input(s): AFPTM, CEA, CA199, CHROMGRNA in the last 8760 hours.  Assessment and Plan: 66 y.o. male with no hx of malignancy with recent imaging finding concerning for metastatic dz. Patient is s/p left T12-L1 paraspinal soft tissue mass bx with IR on 1/3, pathology revealed no malignant cells. Oncology asked IR to repeat bx, case was reviewed and approved by Dr. Laurence Ferrari.   NPO since MN VSS CBC on 1/2  showed leukocytosis, WBC 15.8 pt on Decadron. No labs obtained x 3 days  On 5,000 unit sq heparin q8h no INR in chart - ok tp precede per Dr. Laurence Ferrari   Risks and benefits of left T12 paraspinal soft tissue mass biopsy was discussed with the patient and/or patient's family including, but not limited to bleeding, infection, damage to adjacent structures or low yield requiring additional tests.  All of the questions were answered and there is agreement to proceed.  Consent signed and in chart.   Thank you for this interesting consult.  I greatly enjoyed meeting Gershom Brobeck and look forward to participating in their care.  A copy of this report was sent to the requesting provider on this date.  Electronically Signed: Tera Mater, PA-C 04/28/2021, 1:51 PM   I spent a total of 40 Minutes  in face  to face in clinical consultation, greater than 50% of which was counseling/coordinating care for paraspinal soft tissue mass biopsy.   This chart was dictated using voice recognition software.  Despite best efforts to proofread,  errors can occur which can change the documentation meaning.

## 2021-04-29 ENCOUNTER — Ambulatory Visit
Admit: 2021-04-29 | Discharge: 2021-04-29 | Disposition: A | Discharge status: Home / Self Care | Payer: Medicare Other | Attending: Radiation Oncology | Admitting: Radiation Oncology

## 2021-04-29 MED ORDER — OXYCODONE HCL 5 MG PO TABS
5.0000 mg | ORAL_TABLET | ORAL | Status: DC | PRN
  Administered 2021-04-29 – 2021-05-03 (×12): 5 mg via ORAL
  Filled 2021-04-29 (×13): qty 1

## 2021-04-29 MED ORDER — SODIUM CHLORIDE 0.9 % IV BOLUS
500.0000 mL | Freq: Once | INTRAVENOUS | Status: AC
  Administered 2021-04-29: 500 mL via INTRAVENOUS

## 2021-04-29 NOTE — Progress Notes (Signed)
PROGRESS NOTE    Christopher Dawson  BDZ:[id number] DOB: November 01, 1955 DOA: 04/23/2021 PCP: [name], MD   Brief Narrative: Christopher Dawson is a 66 y.o. male with a history of anxiety, depression, chronic lumbar radiculopathy, tobacco abuse, alcohol abuse. Patient presented secondary to intractable back pain and inability to ambulate. 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 Radiation therapy initiated   Assessment & Plan:   Intractable back pain Secondary to thoracic and lumbar spine lesions concerning for metastasis with associated T12 destructive lesion and associated pathologic fracture/epidural tumor confirmed on MRI. Neurosurgery consulted with recommendations for no need for decompressive surgery. Radiation oncology consulted and have initiated radiation therapy. -Palliative care recommendations: Oxycodone, Decadron  Left lung mass Concern for possible malignancy with metastasis. CT guided biopsy (1/3) performed of left paraspinal lesions at level of T12-L1 with inconclusive pathology. Medical oncology consulted and are on board. Repeat biopsy (1/5). Patient will need to follow-up with oncology as an outpatient and await biopsy results. Patient to continue radiation therapy as an outpatient once discharged.  Right adrenal mass Indeterminate. Possible metastasis with recommendation for PET-CT.  Tobacco use -Continue nicotine patch  Depression -Continue clomipramine  Alcohol abuse Cessation discussed. Not currently in withdrawal. -Continue thiamine, folate  Underweight Body mass index is 18.52 kg/m.[name] seen by dietitian. -Dietitian recommendations (04/25/21): continue Ensure Enlive but will increase from BID to TID, each supplement provides 350 kcal and 20 grams of protein. complete NFPE when feasible.    DVT prophylaxis: Heparin subq Code Status:   Code  Status: Partial Code Family Communication: None at bedside Disposition Plan: Discharge to SNF once bed is available and if patient agrees   Consultants:  Medical oncology Radiation oncology Neurosurgery Palliative care medicine  Procedures:  RADIATION THERAPY (1/4 >>  Antimicrobials: None    Subjective: Back pain. Recently came back from radiation therapy. No other concerns.  Objective: Vitals:   04/28/21 1631 04/28/21 1732 04/28/21 2113 04/29/21 0528  BP: (!) 132/93 139/82 103/75 (!) 137/92  Pulse: (!) 103 91 94 100  Resp: 20 20 16 16   Temp: (!) 97.5 F (36.4 C) 97.7 F (36.5 C) 97.9 F (36.6 C) 97.7 F (36.5 C)  TempSrc: Oral Oral Oral Oral  SpO2: 95% 95% 97% 98%  Weight:      Height:        Intake/Output Summary (Last 24 hours) at 04/29/2021 1319 Last data filed at 04/29/2021 0959 Gross per 24 hour  Intake 170 ml  Output 900 ml  Net -730 ml    Filed Weights   04/23/21 2343  Weight: 67.2 kg    Examination:  General exam: Appears calm and comfortable  Respiratory system: Clear to auscultation. Respiratory effort normal. Cardiovascular system: S1 & S2 heard, RRR.  Gastrointestinal system: Abdomen is nondistended, soft and nontender. No organomegaly or masses felt. Normal bowel sounds heard. Central nervous system: Alert and oriented. No focal neurological deficits. Musculoskeletal: No edema. No calf tenderness Skin: No cyanosis. No rashes Psychiatry: Judgement and insight appear normal. Mood & affect appropriate.     Data Reviewed: I have personally reviewed following labs and imaging studies  CBC Lab Results  Component Value Date   WBC 15.8 (H) 04/25/2021   RBC 4.30 04/25/2021   HGB 13.0 04/25/2021   HCT 39.1 04/25/2021   MCV 90.9 04/25/2021   MCH 30.2 04/25/2021   PLT 339 04/25/2021   MCHC 33.2 04/25/2021  RDW 12.3 04/25/2021   LYMPHSABS 1.6 04/23/2021   MONOABS 0.9 04/23/2021   EOSABS 0.1 04/23/2021   BASOSABS 0.0 04/23/2021      Last metabolic panel Lab Results  Component Value Date   NA 139 04/25/2021   K 3.8 04/25/2021   CL 110 04/25/2021   CO2 25 04/25/2021   BUN 19 04/25/2021   CREATININE 0.53 (L) 04/25/2021   GLUCOSE 165 (H) 04/25/2021   GFRNONAA >60 04/25/2021   GFRAA >60 06/29/2019   CALCIUM 8.8 (L) 04/25/2021   PHOS 3.1 04/23/2021   PROT 6.5 04/24/2021   ALBUMIN 3.1 (L) 04/24/2021   LABGLOB 1.9 07/04/2018   AGRATIO 2.5 (H) 07/04/2018   BILITOT 0.7 04/24/2021   ALKPHOS 68 04/24/2021   AST 13 (L) 04/24/2021   ALT 15 04/24/2021   ANIONGAP 4 (L) 04/25/2021    CBG (last 3)  No results for input(s): GLUCAP in the last 72 hours.   GFR: Estimated Creatinine Clearance: 86.3 mL/min (A) (by C-G formula based on SCr of 0.53 mg/dL (L)).  Coagulation Profile: No results for input(s): INR, PROTIME in the last 168 hours.  Recent Results (from the past 240 hour(s))  Resp Panel by RT-PCR (Flu A&B, Covid) Nasopharyngeal Swab     Status: None   Collection Time: 04/23/21  8:05 PM   Specimen: Nasopharyngeal Swab; Nasopharyngeal(NP) swabs in vial transport medium  Result Value Ref Range Status   SARS Coronavirus 2 by RT PCR NEGATIVE NEGATIVE Final    Comment: (NOTE) SARS-CoV-2 target nucleic acids are NOT DETECTED.  The SARS-CoV-2 RNA is generally detectable in upper respiratory specimens during the acute phase of infection. The lowest concentration of SARS-CoV-2 viral copies this assay can detect is 138 copies/mL. A negative result does not preclude SARS-Cov-2 infection and should not be used as the sole basis for treatment or other patient management decisions. A negative result may occur with  improper specimen collection/handling, submission of specimen other than nasopharyngeal swab, presence of viral mutation(s) within the areas targeted by this assay, and inadequate number of viral copies(<138 copies/mL). A negative result must be combined with clinical observations, patient history, and  epidemiological information. The expected result is Negative.  Fact Sheet for Patients:  EntrepreneurPulse.com.au  Fact Sheet for Healthcare Providers:  IncredibleEmployment.be  This test is no t yet approved or cleared by the [place] FDA and  has been authorized for detection and/or diagnosis of SARS-CoV-2 by FDA under an Emergency Use Authorization (EUA). This EUA will remain  in effect (meaning this test can be used) for the duration of the COVID-19 declaration under Section 564(b)(1) of the Act, 21 U.S.C.section 360bbb-3(b)(1), unless the authorization is terminated  or revoked sooner.       Influenza A by PCR NEGATIVE NEGATIVE Final   Influenza B by PCR NEGATIVE NEGATIVE Final    Comment: (NOTE) The Xpert Xpress SARS-CoV-2/FLU/RSV plus assay is intended as an aid in the diagnosis of influenza from Nasopharyngeal swab specimens and should not be used as a sole basis for treatment. Nasal washings and aspirates are unacceptable for Xpert Xpress SARS-CoV-2/FLU/RSV testing.  Fact Sheet for Patients: EntrepreneurPulse.com.au  Fact Sheet for Healthcare Providers: IncredibleEmployment.be  This test is not yet approved or cleared by the [place] FDA and has been authorized for detection and/or diagnosis of SARS-CoV-2 by FDA under an Emergency Use Authorization (EUA). This EUA will remain in effect (meaning this test can be used) for the duration of the COVID-19 declaration under Section 564(b)(1) of  the Act, 21 U.S.C. section 360bbb-3(b)(1), unless the authorization is terminated or revoked.  Performed at [facility], [place] [address] [place], [place] [address]         Radiology Studies: CT BIOPSY  Result Date: May 19, 2021 Criselda Peaches, MD     May 19, 2021  4:22 PM Interventional Radiology Procedure Note Procedure: Repeat CT bx of T12 metastatic lesion Complications:  None Estimated Blood Loss: None Recommendations: - Return to floor - Path sent Signed, Criselda Peaches, MD        Scheduled Meds:  clomiPRAMINE  100 mg Oral QHS   dexamethasone (DECADRON) injection  4 mg Intravenous Q6H   feeding supplement  237 mL Oral TID BM   folic acid  1 mg Oral Daily   heparin injection (subcutaneous)  5,000 Units Subcutaneous Q8H   losartan  25 mg Oral Daily   multivitamin with minerals  1 tablet Oral Daily   nicotine  14 mg Transdermal Q24H   pantoprazole  40 mg Oral Daily   thiamine  100 mg Oral Daily   Continuous Infusions:   LOS: 6 days     Cordelia Poche, MD [facility] 04/29/2021, 1:19 PM  If 7PM-7AM, please contact night-coverage [email or web address]

## 2021-04-29 NOTE — NC FL2 (Addendum)
[place] LEVEL OF CARE SCREENING TOOL     IDENTIFICATION  Patient Name: Christopher Dawson Birthdate: 1955-05-31 Sex: male Admission Date (Current Location): 04/23/2021  [facility] and [place] Number:  Herbalist and Address:  [facility],  [place], [place]      Provider Number: [id number]  Attending Physician Name and Address:  Lavina Hamman, MD  Relative Name and Phone Number:       Current Level of Care: Hospital Recommended Level of Care: [place] Prior Approval Number:    Date Approved/Denied:   PASRR Number: [id number]  Discharge Plan: SNF    Current Diagnoses: Patient Active Problem List   Diagnosis Date Noted   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   Intractable back pain 04/23/2021   Elevated BP without diagnosis of hypertension 11/25/2020   Poor social situation 11/25/2020   Alcohol withdrawal syndrome without complication ([place])    H/O alcohol abuse 05/09/2017   Hypocalcemia 05/09/2017   Leukopenia 05/09/2017   Thrombocytopenia ([place]) 05/09/2014   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   Abnormality of gait 04/12/2013   Tobacco use disorder 07/04/2012   GAD (generalized anxiety disorder) 07/04/2012   Major depressive disorder, recurrent episode ([place]) 07/04/2012   Hyperlipidemia 06/24/2012    Orientation RESPIRATION BLADDER Height & Weight     Self, Time, Situation, Place  Normal Continent Weight: 151 lb 7.3 oz (68.7 kg) Height:  6\' 4"  (193 cm) (reported by patient)  BEHAVIORAL SYMPTOMS/MOOD NEUROLOGICAL BOWEL NUTRITION STATUS      Incontinent    AMBULATORY STATUS COMMUNICATION OF NEEDS Skin   Extensive Assist Verbally Normal                       Personal Care Assistance Level of Assistance  Bathing, Dressing Bathing Assistance: Limited assistance   Dressing Assistance: Limited assistance     Functional Limitations Info             SPECIAL CARE FACTORS FREQUENCY  OT (By licensed  OT), PT (By licensed PT)     PT Frequency: 5x/wk OT Frequency: 5x/wk            Contractures Contractures Info: Not present    Additional Factors Info  Code Status, Allergies, Psychotropic Code Status Info: Partial Allergies Info: codeine Psychotropic Info: see MAR         Current Medications (05/04/2021):  This is the current hospital active medication list Current Facility-Administered Medications  Medication Dose Route Frequency Provider Last Rate Last Admin   acetaminophen (TYLENOL) tablet 650 mg  650 mg Oral Q6H PRN Barton Dubois, MD   650 mg at 04/24/21 0049   Or   acetaminophen (TYLENOL) suppository 650 mg  650 mg Rectal Q6H PRN Barton Dubois, MD       clomiPRAMINE (ANAFRANIL) capsule 100 mg  100 mg Oral QHS Barton Dubois, MD   100 mg at 05/03/21 2121   dexamethasone (DECADRON) tablet 4 mg  4 mg Oral Q8H Mariel Aloe, MD   4 mg at 05/04/21 0517   feeding supplement (ENSURE ENLIVE / ENSURE PLUS) liquid 237 mL  237 mL Oral TID BM Kc, Ramesh, MD   237 mL at 31/51/76 1607   folic acid (FOLVITE) tablet 1 mg  1 mg Oral Daily Barton Dubois, MD   1 mg at 05/04/21 1026   gabapentin (NEURONTIN) capsule 100 mg  100 mg Oral TID Mariel Aloe, MD   100 mg at 05/04/21  1025   heparin injection 5,000 Units  5,000 Units Subcutaneous Q8H Kc, [place]aren [place], MD   5,000 Units at 05/04/21 0518   hydrOXYzine (ATARAX) tablet 25 mg  25 mg Oral TID PRN Mariel Aloe, MD   25 mg at 05/02/21 1020   losartan (COZAAR) tablet 25 mg  25 mg Oral Daily Mariel Aloe, MD   25 mg at 05/04/21 1025   multivitamin with minerals tablet 1 tablet  1 tablet Oral Daily Barton Dubois, MD   1 tablet at 05/04/21 1025   nicotine (NICODERM CQ - dosed in mg/24 hours) patch 14 mg  14 mg Transdermal Q24H Barton Dubois, MD   14 mg at 05/01/21 2259   ondansetron (ZOFRAN) tablet 4 mg  4 mg Oral Q6H PRN Barton Dubois, MD       Or   ondansetron [facility]) injection 4 mg  4 mg Intravenous Q6H PRN Barton Dubois, MD        oxyCODONE (Oxy IR/ROXICODONE) immediate release tablet 5-10 mg  5-10 mg Oral Q4H PRN Maryanna Shape, NP   5 mg at 05/04/21 1025   pantoprazole (PROTONIX) EC tablet 40 mg  40 mg Oral Daily Barton Dubois, MD   40 mg at 05/04/21 1025   polyethylene glycol (MIRALAX / GLYCOLAX) packet 17 g  17 g Oral BID Mariel Aloe, MD   17 g at 05/01/21 2258   senna-docusate (Senokot-S) tablet 1 tablet  1 tablet Oral BID PRN Maryanna Shape, NP   1 tablet at 05/03/21 2119   thiamine tablet 100 mg  100 mg Oral Daily Barton Dubois, MD   100 mg at 05/04/21 1025     Discharge Medications: Please see discharge summary for a list of discharge medications.  Relevant Imaging Results:  Relevant Lab Results:   Additional Information SS# [id number]  Lennart Pall, LCSW

## 2021-04-29 NOTE — Progress Notes (Signed)
Physical Therapy Treatment Patient Details Name: Christopher Dawson MRN: [id number] DOB: 05-07-1955 Today's Date: 04/29/2021   History of Present Illness 66 y.o. male with PMH of anxiety/depression, chronic lumbar radiculopathy, tobacco abuse, Marfans, alcohol abuse. Pt verbally reports h/o cerebral palsy with 2 childhood surgeries "so I wouldn't walk on my toes."  Pt presented with  ongoing physical deconditioning for the last 6 months using cane/walker at home presented with intractable back pain and inability to ambulate, or get up to cook his meals or by groceries. Complains of constipation no urinary issues.  In the ED -CT scan-T12 and T9 vertebra process/destruction with concern for metastatic malignant lesions. 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    PT Comments    Pt donned his socks and shoes in bed and required increased time.  Pt assisted to standing and only able to tolerate a few feet of ambulation due to pain.  Pt also requiring increased assist to mobilize safely with multiple gait deviations.  Pt would not be safe to ambulate alone.  Pt may need to work on transfers and mobility with w/c for eventual return home however continue to recommend SNF upon d/c at this time.    Recommendations for follow up therapy are one component of a multi-disciplinary discharge planning process, led by the attending physician.  Recommendations may be updated based on patient status, additional functional criteria and insurance authorization.  Follow Up Recommendations  Skilled nursing-short term rehab (<3 hours/day)     Assistance Recommended at Discharge Frequent or constant Supervision/Assistance  Patient can return home with the following A lot of help with bathing/dressing/bathroom;A little help with walking and/or transfers;Assistance with cooking/housework;Assist for transportation;Help with stairs or ramp for entrance   Equipment Recommendations  Wheelchair (measurements PT);Wheelchair cushion  (measurements PT)    Recommendations for Other Services       Precautions / Restrictions Precautions Precautions: Fall     Mobility  Bed Mobility Overal bed mobility: Modified Independent             General bed mobility comments: Able to transfer himself with use of bed rails.    Transfers Overall transfer level: Needs assistance Equipment used: Rolling walker (2 wheels) Transfers: Sit to/from Stand Sit to Stand: Min assist;From elevated surface           General transfer comment: required elevated bed, required assist to rise and steady, pt with increased PF of ankles and increased hip and knee flexion upon standing; heavily reliant on UE assist/support    Ambulation/Gait Ambulation/Gait assistance: Min assist Gait Distance (Feet): 3 Feet (forward and then backwards) Assistive device: Rolling walker (2 wheels) Gait Pattern/deviations: Knee flexed in stance - right;Knee flexed in stance - left;Decreased dorsiflexion - right;Decreased dorsiflexion - left;Narrow base of support;Decreased stride length       General Gait Details: pt with increased PF of ankles and increased hip and knee flexion, only able to take a few steps forward and then backwards to bed; pt also stating RW is not steady and attempting to reach/hold sink area; reports pain too much to continue   Stairs             Wheelchair Mobility    Modified Rankin (Stroke Patients Only)       Balance Overall balance assessment: Needs assistance         Standing balance support: Bilateral upper extremity supported Standing balance-Leahy Scale: Poor Standing balance comment: reliant upon BUE support  Cognition Arousal/Alertness: Awake/alert Behavior During Therapy: Anxious Overall Cognitive Status: Within Functional Limits for tasks assessed                                 General Comments: cognition is functional but he is easily  distracted by internal and external factors        Exercises      General Comments        Pertinent Vitals/Pain Pain Assessment: 0-10 Pain Score: 8  Pain Location: back Pain Descriptors / Indicators: Constant Pain Intervention(s): Monitored during session;Premedicated before session;Repositioned    Home Living                          Prior Function            PT Goals (current goals can now be found in the care plan section) Progress towards PT goals: Progressing toward goals    Frequency    Min 2X/week      PT Plan Current plan remains appropriate    Co-evaluation              AM-PAC PT "6 Clicks" Mobility   Outcome Measure  Help needed turning from your back to your side while in a flat bed without using bedrails?: A Little Help needed moving from lying on your back to sitting on the side of a flat bed without using bedrails?: A Lot Help needed moving to and from a bed to a chair (including a wheelchair)?: A Lot Help needed standing up from a chair using your arms (e.g., wheelchair or bedside chair)?: A Lot Help needed to walk in hospital room?: A Lot Help needed climbing 3-5 steps with a railing? : Total 6 Click Score: 12    End of Session Equipment Utilized During Treatment: Gait belt Activity Tolerance: Patient limited by pain;Patient limited by fatigue Patient left: in bed;with call bell/phone within reach Nurse Communication: Mobility status PT Visit Diagnosis: Other abnormalities of gait and mobility (R26.89);Unsteadiness on feet (R26.81);Muscle weakness (generalized) (M62.81);Adult, failure to thrive (R62.7)     Time: [id number] PT Time Calculation (min) (ACUTE ONLY): 20 min  Charges:  $Gait Training: 8-22 mins                    Arlyce Dice, DPT Acute Rehabilitation Services Pager: [phone] Office: [place] 04/29/2021, 12:57 PM

## 2021-04-29 NOTE — Plan of Care (Signed)
  Problem: Education: Goal: Knowledge of General Education information will improve Description: Including pain rating scale, medication(s)/side effects and non-pharmacologic comfort measures Outcome: Progressing   Problem: Activity: Goal: Risk for activity intolerance will decrease Outcome: Progressing   Problem: Pain Managment: Goal: General experience of comfort will improve Outcome: Progressing   

## 2021-04-29 NOTE — Care Management Important Message (Signed)
Important Message  Patient Details IM Letter placed in Patients room. Name: Christopher Dawson MRN: [id number] Date of Birth: [birth year]   Medicare Important Message Given:  Yes     Kerin Salen 04/29/2021, 11:16 AM

## 2021-04-30 MED ORDER — DEXAMETHASONE 4 MG PO TABS
4.0000 mg | ORAL_TABLET | Freq: Three times a day (TID) | ORAL | Status: DC
  Administered 2021-04-30 – 2021-05-12 (×37): 4 mg via ORAL
  Filled 2021-04-30 (×37): qty 1

## 2021-04-30 NOTE — Plan of Care (Signed)
  Problem: Activity: Goal: Risk for activity intolerance will decrease Outcome: Progressing   Problem: Elimination: Goal: Will not experience complications related to urinary retention Outcome: Progressing   

## 2021-04-30 NOTE — Progress Notes (Signed)
PROGRESS NOTE    Christopher Dawson  OIZ:[id number] DOB: [birth year] DOA: 04/23/2021 PCP: [name], MD   Brief Narrative: Christopher Dawson is a 66 y.o. male with a history of anxiety, depression, chronic lumbar radiculopathy, tobacco abuse, alcohol abuse. Patient presented secondary to intractable back pain and inability to ambulate. 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 Radiation therapy initiated   Assessment & Plan:   Intractable back pain Secondary to thoracic and lumbar spine lesions concerning for metastasis with associated T12 destructive lesion and associated pathologic fracture/epidural tumor confirmed on MRI. Neurosurgery consulted with recommendations for no need for decompressive surgery. Radiation oncology consulted and have initiated radiation therapy. -Palliative care recommendations: Oxycodone, Decadron  Left lung mass Concern for possible malignancy with metastasis. CT guided biopsy (1/3) performed of left paraspinal lesions at level of T12-L1 with inconclusive pathology. Medical oncology consulted and are on board. Repeat biopsy (1/5) with pathology pending. Patient will need to follow-up with oncology as an outpatient and await biopsy results. Patient to continue radiation therapy as an outpatient once discharged.  Right adrenal mass Indeterminate. Possible metastasis with recommendation for PET-CT.  Tobacco use -Continue nicotine patch  Depression -Continue clomipramine  Alcohol abuse Cessation discussed. Not currently in withdrawal. -Continue thiamine, folate  Anxiety -Atarax prn  Underweight Body mass index is 18.52 kg/m.[name] seen by dietitian. -Dietitian recommendations (04/25/21): continue Ensure Enlive but will increase from BID to TID, each supplement provides 350 kcal and 20 grams of protein. complete NFPE when feasible.    DVT  prophylaxis: Heparin subq Code Status:   Code Status: Partial Code Family Communication: None at bedside Disposition Plan: Discharge to SNF once bed is available. Medically stable for discharge.   Consultants:  Medical oncology Radiation oncology Neurosurgery Palliative care medicine  Procedures:  RADIATION THERAPY (1/4 >>  Antimicrobials: None    Subjective: Back pain. Asking about anxiety medications as he used to take Ativan. No other concerns.  Objective: Vitals:   04/29/21 1709 04/29/21 2221 04/30/21 0324 04/30/21 0530  BP: 124/80 118/76 (!) 141/98 (!) 133/95  Pulse: (!) 104 99 (!) 102 (!) 105  Resp: 16 20 20    Temp:  98.5 F (36.9 C) 97.6 F (36.4 C)   TempSrc:  Oral Oral   SpO2: 96% 95% 94% 95%  Weight:      Height:        Intake/Output Summary (Last 24 hours) at 04/30/2021 1225 Last data filed at 04/30/2021 1000 Gross per 24 hour  Intake 1440 ml  Output 1000 ml  Net 440 ml    Filed Weights   04/23/21 2343  Weight: 67.2 kg    Examination:  General exam: Appears calm and comfortable   Data Reviewed: I have personally reviewed following labs and imaging studies  CBC Lab Results  Component Value Date   WBC 15.8 (H) 04/25/2021   RBC 4.30 04/25/2021   HGB 13.0 04/25/2021   HCT 39.1 04/25/2021   MCV 90.9 04/25/2021   MCH 30.2 04/25/2021   PLT 339 04/25/2021   MCHC 33.2 04/25/2021   RDW 12.3 04/25/2021   LYMPHSABS 1.6 04/23/2021   MONOABS 0.9 04/23/2021   EOSABS 0.1 04/23/2021   BASOSABS 0.0 33/82/5053     Last metabolic panel Lab Results  Component Value Date   NA 139 04/25/2021   K 3.8 04/25/2021   CL 110 04/25/2021   CO2 25 04/25/2021   BUN 19 04/25/2021  CREATININE 0.53 (L) 04/25/2021   GLUCOSE 165 (H) 04/25/2021   GFRNONAA >60 04/25/2021   GFRAA >60 06/29/2019   CALCIUM 8.8 (L) 04/25/2021   PHOS 3.1 04/23/2021   PROT 6.5 04/24/2021   ALBUMIN 3.1 (L) 04/24/2021   LABGLOB 1.9 07/04/2018   AGRATIO 2.5 (H) 07/04/2018    BILITOT 0.7 04/24/2021   ALKPHOS 68 04/24/2021   AST 13 (L) 04/24/2021   ALT 15 04/24/2021   ANIONGAP 4 (L) 04/25/2021    CBG (last 3)  No results for input(s): GLUCAP in the last 72 hours.   GFR: Estimated Creatinine Clearance: 86.3 mL/min (A) (by C-G formula based on SCr of 0.53 mg/dL (L)).  Coagulation Profile: No results for input(s): INR, PROTIME in the last 168 hours.  Recent Results (from the past 240 hour(s))  Resp Panel by RT-PCR (Flu A&B, Covid) Nasopharyngeal Swab     Status: None   Collection Time: 04/23/21  8:05 PM   Specimen: Nasopharyngeal Swab; Nasopharyngeal(NP) swabs in vial transport medium  Result Value Ref Range Status   SARS Coronavirus 2 by RT PCR NEGATIVE NEGATIVE Final    Comment: (NOTE) SARS-CoV-2 target nucleic acids are NOT DETECTED.  The SARS-CoV-2 RNA is generally detectable in upper respiratory specimens during the acute phase of infection. The lowest concentration of SARS-CoV-2 viral copies this assay can detect is 138 copies/mL. A negative result does not preclude SARS-Cov-2 infection and should not be used as the sole basis for treatment or other patient management decisions. A negative result may occur with  improper specimen collection/handling, submission of specimen other than nasopharyngeal swab, presence of viral mutation(s) within the areas targeted by this assay, and inadequate number of viral copies(<138 copies/mL). A negative result must be combined with clinical observations, patient history, and epidemiological information. The expected result is Negative.  Fact Sheet for Patients:  EntrepreneurPulse.com.au  Fact Sheet for Healthcare Providers:  IncredibleEmployment.be  This test is no t yet approved or cleared by the [place] FDA and  has been authorized for detection and/or diagnosis of SARS-CoV-2 by FDA under an Emergency Use Authorization (EUA). This EUA will remain  in effect  (meaning this test can be used) for the duration of the COVID-19 declaration under Section 564(b)(1) of the Act, 21 U.S.C.section 360bbb-3(b)(1), unless the authorization is terminated  or revoked sooner.       Influenza A by PCR NEGATIVE NEGATIVE Final   Influenza B by PCR NEGATIVE NEGATIVE Final    Comment: (NOTE) The Xpert Xpress SARS-CoV-2/FLU/RSV plus assay is intended as an aid in the diagnosis of influenza from Nasopharyngeal swab specimens and should not be used as a sole basis for treatment. Nasal washings and aspirates are unacceptable for Xpert Xpress SARS-CoV-2/FLU/RSV testing.  Fact Sheet for Patients: EntrepreneurPulse.com.au  Fact Sheet for Healthcare Providers: IncredibleEmployment.be  This test is not yet approved or cleared by the [place] FDA and has been authorized for detection and/or diagnosis of SARS-CoV-2 by FDA under an Emergency Use Authorization (EUA). This EUA will remain in effect (meaning this test can be used) for the duration of the COVID-19 declaration under Section 564(b)(1) of the Act, 21 U.S.C. section 360bbb-3(b)(1), unless the authorization is terminated or revoked.  Performed at [facility], [place] [address], [place], [place] [address]         Radiology Studies: CT BIOPSY  Result Date: 2021-05-07 Criselda Peaches, MD     05-07-2021  4:22 PM Interventional Radiology Procedure Note Procedure: Repeat CT bx  of T12 metastatic lesion Complications: None Estimated Blood Loss: None Recommendations: - Return to floor - Path sent Signed, Criselda Peaches, MD        Scheduled Meds:  clomiPRAMINE  100 mg Oral QHS   dexamethasone  4 mg Oral Q8H   feeding supplement  237 mL Oral TID BM   folic acid  1 mg Oral Daily   heparin injection (subcutaneous)  5,000 Units Subcutaneous Q8H   losartan  25 mg Oral Daily   multivitamin with minerals  1 tablet Oral Daily   nicotine  14 mg  Transdermal Q24H   pantoprazole  40 mg Oral Daily   thiamine  100 mg Oral Daily   Continuous Infusions:   LOS: 7 days     Cordelia Poche, MD [facility] 04/30/2021, 12:25 PM  If 7PM-7AM, please contact night-coverage [email or web address]

## 2021-05-01 MED ORDER — GABAPENTIN 100 MG PO CAPS
100.0000 mg | ORAL_CAPSULE | Freq: Three times a day (TID) | ORAL | Status: DC
  Administered 2021-05-01 – 2021-05-13 (×36): 100 mg via ORAL
  Filled 2021-05-01 (×36): qty 1

## 2021-05-01 MED ORDER — POLYETHYLENE GLYCOL 3350 17 G PO PACK
17.0000 g | PACK | Freq: Two times a day (BID) | ORAL | Status: DC
  Administered 2021-05-01: 17 g via ORAL
  Filled 2021-05-01 (×6): qty 1

## 2021-05-01 NOTE — Plan of Care (Signed)
  Problem: Nutrition: Goal: Adequate nutrition will be maintained Outcome: Progressing   Problem: Coping: Goal: Level of anxiety will decrease Outcome: Progressing   Problem: Pain Managment: Goal: General experience of comfort will improve Outcome: Progressing   

## 2021-05-01 NOTE — Progress Notes (Signed)
PROGRESS NOTE    Christopher Dawson  WER:[id number] DOB: 1955/06/14 DOA: 04/23/2021 PCP: [name], MD   Brief Narrative: Christopher Dawson is a 66 y.o. male with a history of anxiety, depression, chronic lumbar radiculopathy, tobacco abuse, alcohol abuse. Patient presented secondary to intractable back pain and inability to ambulate. 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 Radiation therapy initiated   Assessment & Plan:   Intractable back pain Secondary to thoracic and lumbar spine lesions concerning for metastasis with associated T12 destructive lesion and associated pathologic fracture/epidural tumor confirmed on MRI. Neurosurgery consulted with recommendations for no need for decompressive surgery. Radiation oncology consulted and have initiated radiation therapy. -Palliative care recommendations: Oxycodone, Decadron -Will add gabapentin and titrate as needed  Left lung mass Concern for possible malignancy with metastasis. CT guided biopsy (1/3) performed of left paraspinal lesions at level of T12-L1 with inconclusive pathology. Medical oncology consulted and are on board. Repeat biopsy (1/5) with pathology pending. Patient will need to follow-up with oncology as an outpatient and await biopsy results. Patient to continue radiation therapy as an outpatient once discharged.  Right adrenal mass Indeterminate. Possible metastasis with recommendation for PET-CT.  Tobacco use -Continue nicotine patch  Depression -Continue clomipramine  Alcohol abuse Cessation discussed. Not currently in withdrawal. -Continue thiamine, folate  Anxiety -Atarax prn  Underweight Body mass index is 18.52 kg/m.[name] seen by dietitian. -Dietitian recommendations (04/25/21): continue Ensure Enlive but will increase from BID to TID, each supplement provides 350 kcal and 20 grams of  protein. complete NFPE when feasible.    DVT prophylaxis: Heparin subq Code Status:   Code Status: Partial Code Family Communication: None at bedside Disposition Plan: Discharge to SNF once bed is available. Medically stable for discharge.   Consultants:  Medical oncology Radiation oncology Neurosurgery Palliative care medicine  Procedures:  RADIATION THERAPY (1/4 >>  Antimicrobials: None    Subjective: Patient reports feeling warm-type pain in his left lower back. Continued back pain.  Objective: Vitals:   04/30/21 0530 04/30/21 1405 05/01/21 0450 05/01/21 1452  BP: (!) 133/95 114/72 114/79 113/71  Pulse: (!) 105 (!) 102 99 (!) 103  Resp:  18 18 18   Temp:  97.8 F (36.6 C) 98.1 F (36.7 C) 98.4 F (36.9 C)  TempSrc:  Oral  Oral  SpO2: 95% 95% 97% 96%  Weight:      Height:        Intake/Output Summary (Last 24 hours) at 05/01/2021 1453 Last data filed at 04/30/2021 2132 Gross per 24 hour  Intake 120 ml  Output 850 ml  Net -730 ml    Filed Weights   04/23/21 2343  Weight: 67.2 kg    Examination:  General: Well appearing, no distress   Data Reviewed: I have personally reviewed following labs and imaging studies  CBC Lab Results  Component Value Date   WBC 15.8 (H) 04/25/2021   RBC 4.30 04/25/2021   HGB 13.0 04/25/2021   HCT 39.1 04/25/2021   MCV 90.9 04/25/2021   MCH 30.2 04/25/2021   PLT 339 04/25/2021   MCHC 33.2 04/25/2021   RDW 12.3 04/25/2021   LYMPHSABS 1.6 04/23/2021   MONOABS 0.9 04/23/2021   EOSABS 0.1 04/23/2021   BASOSABS 0.0 19/50/9326     Last metabolic panel Lab Results  Component Value Date   NA 139 04/25/2021   K 3.8 04/25/2021   CL 110 04/25/2021   CO2 25  04/25/2021   BUN 19 04/25/2021   CREATININE 0.53 (L) 04/25/2021   GLUCOSE 165 (H) 04/25/2021   GFRNONAA >60 04/25/2021   GFRAA >60 06/29/2019   CALCIUM 8.8 (L) 04/25/2021   PHOS 3.1 04/23/2021   PROT 6.5 04/24/2021   ALBUMIN 3.1 (L) 04/24/2021   LABGLOB 1.9  07/04/2018   AGRATIO 2.5 (H) 07/04/2018   BILITOT 0.7 04/24/2021   ALKPHOS 68 04/24/2021   AST 13 (L) 04/24/2021   ALT 15 04/24/2021   ANIONGAP 4 (L) 04/25/2021    CBG (last 3)  No results for input(s): GLUCAP in the last 72 hours.   GFR: Estimated Creatinine Clearance: 86.3 mL/min (A) (by C-G formula based on SCr of 0.53 mg/dL (L)).  Coagulation Profile: No results for input(s): INR, PROTIME in the last 168 hours.  Recent Results (from the past 240 hour(s))  Resp Panel by RT-PCR (Flu A&B, Covid) Nasopharyngeal Swab     Status: None   Collection Time: 04/23/21  8:05 PM   Specimen: Nasopharyngeal Swab; Nasopharyngeal(NP) swabs in vial transport medium  Result Value Ref Range Status   SARS Coronavirus 2 by RT PCR NEGATIVE NEGATIVE Final    Comment: (NOTE) SARS-CoV-2 target nucleic acids are NOT DETECTED.  The SARS-CoV-2 RNA is generally detectable in upper respiratory specimens during the acute phase of infection. The lowest concentration of SARS-CoV-2 viral copies this assay can detect is 138 copies/mL. A negative result does not preclude SARS-Cov-2 infection and should not be used as the sole basis for treatment or other patient management decisions. A negative result may occur with  improper specimen collection/handling, submission of specimen other than nasopharyngeal swab, presence of viral mutation(s) within the areas targeted by this assay, and inadequate number of viral copies(<138 copies/mL). A negative result must be combined with clinical observations, patient history, and epidemiological information. The expected result is Negative.  Fact Sheet for Patients:  EntrepreneurPulse.com.au  Fact Sheet for Healthcare Providers:  IncredibleEmployment.be  This test is no t yet approved or cleared by the [place] FDA and  has been authorized for detection and/or diagnosis of SARS-CoV-2 by FDA under an Emergency Use Authorization  (EUA). This EUA will remain  in effect (meaning this test can be used) for the duration of the COVID-19 declaration under Section 564(b)(1) of the Act, 21 U.S.C.section 360bbb-3(b)(1), unless the authorization is terminated  or revoked sooner.       Influenza A by PCR NEGATIVE NEGATIVE Final   Influenza B by PCR NEGATIVE NEGATIVE Final    Comment: (NOTE) The Xpert Xpress SARS-CoV-2/FLU/RSV plus assay is intended as an aid in the diagnosis of influenza from Nasopharyngeal swab specimens and should not be used as a sole basis for treatment. Nasal washings and aspirates are unacceptable for Xpert Xpress SARS-CoV-2/FLU/RSV testing.  Fact Sheet for Patients: EntrepreneurPulse.com.au  Fact Sheet for Healthcare Providers: IncredibleEmployment.be  This test is not yet approved or cleared by the [place] FDA and has been authorized for detection and/or diagnosis of SARS-CoV-2 by FDA under an Emergency Use Authorization (EUA). This EUA will remain in effect (meaning this test can be used) for the duration of the COVID-19 declaration under Section 564(b)(1) of the Act, 21 U.S.C. section 360bbb-3(b)(1), unless the authorization is terminated or revoked.  Performed at [facility], [place] [address], [place], [place] [address]         Radiology Studies: No results found.      Scheduled Meds:  clomiPRAMINE  100 mg Oral QHS  dexamethasone  4 mg Oral Q8H   feeding supplement  237 mL Oral TID BM   folic acid  1 mg Oral Daily   gabapentin  100 mg Oral TID   heparin injection (subcutaneous)  5,000 Units Subcutaneous Q8H   losartan  25 mg Oral Daily   multivitamin with minerals  1 tablet Oral Daily   nicotine  14 mg Transdermal Q24H   pantoprazole  40 mg Oral Daily   polyethylene glycol  17 g Oral BID   thiamine  100 mg Oral Daily   Continuous Infusions:   LOS: 8 days     Cordelia Poche, MD [facility] 05/01/2021, 2:53 PM  If 7PM-7AM, please contact night-coverage [email or web address]

## 2021-05-01 NOTE — Plan of Care (Signed)
  Problem: Education: Goal: Knowledge of General Education information will improve Description: Including pain rating scale, medication(s)/side effects and non-pharmacologic comfort measures Outcome: Progressing   Problem: Activity: Goal: Risk for activity intolerance will decrease Outcome: Progressing   Problem: Pain Managment: Goal: General experience of comfort will improve Outcome: Progressing   

## 2021-05-02 ENCOUNTER — Ambulatory Visit
Admit: 2021-05-02 | Discharge: 2021-05-02 | Disposition: A | Discharge status: Home / Self Care | Payer: Medicare Other | Attending: Radiation Oncology | Admitting: Radiation Oncology

## 2021-05-02 ENCOUNTER — Telehealth: Payer: Self-pay | Admitting: *Deleted

## 2021-05-02 DIAGNOSIS — M549 Dorsalgia, unspecified: Secondary | ICD-10-CM | POA: Diagnosis not present

## 2021-05-02 NOTE — Plan of Care (Signed)
°  Problem: Education: Goal: Knowledge of General Education information will improve Description: Including pain rating scale, medication(s)/side effects and non-pharmacologic comfort measures Outcome: Progressing   Problem: Clinical Measurements: Goal: Cardiovascular complication will be avoided Outcome: Progressing   Problem: Coping: Goal: Level of anxiety will decrease Outcome: [place], RN 05/02/21 12:03 PM

## 2021-05-02 NOTE — Progress Notes (Signed)
Nutrition Follow-up  DOCUMENTATION CODES:   Underweight  INTERVENTION:  - continue Ensure Enlive TID. - will provide coupons for Ensure for home use. - weigh today.    NUTRITION DIAGNOSIS:   Increased nutrient needs related to acute illness as evidenced by estimated needs. -ongoing  GOAL:   Patient will meet greater than or equal to 90% of their needs -met  MONITOR:   PO intake, Supplement acceptance, Labs, Weight trends   ASSESSMENT:   66 y.o. male with medical history of anxiety, depression, chronic lumbar radiculopathy, tobacco abuse, alcohol abuse, and ongoing physical deconditioning for the last 6 months using cane/walker at home. He presented to the ED due to intractable back pain and inability to ambulate which has led to inability to cook meals or grocery shop. He reported constipation and issues with urination. In the ED, CT showed T12 and T9 fractures with concern for metastatic malignant lesions. CT chest showed LUL nodule with pleural stranding which is being considered a primary neoplasm.  Patient laying in bed with no visitors present at the time of RD visit. He reports having pancakes and he thinks sausage for breakfast, but is unable to recall for certain. He confirms that he has been drinking Ensure supplements and states that he enjoys them.   Per review of flow sheet and orders, he has been eating 95-100% of meals over the past 3 days and has been accepting Ensure 90% of the time offered.   Patient began palliative XRT on 1/4. He reports this has been going well and he has no pain/discomfort with eating and no nausea associated with treatment.   He shares that he is likely to go to rehab after d/c from here.   He has not been weighed since admission on 12/31. No information documented in the edema section of flow sheet.    Labs reviewed; creatinine: 0.53 mg/dl, Ca: 8.8 mg/dl.  Medications reviewed; 1 mg folvite/day, 1 tablet multivitamin with minerals/day,  40 mg oral protonix/day, 17 g miralax BID, 100 mg oral thiamine/day.    NUTRITION - FOCUSED PHYSICAL EXAM:  Patient declined stating that it was already done this AM; provided information on why RD would complete this. Will re-attempt at follow-up.   Diet Order:   Diet Order             Diet regular Room service appropriate? Yes; Fluid consistency: Thin  Diet effective now                   EDUCATION NEEDS:   No education needs have been identified at this time  Skin:  Skin Assessment: Reviewed RN Assessment  Last BM:  1/8 (type 6 x1)  Height:   Ht Readings from Last 1 Encounters:  04/23/21 _0  (1.905 m)    Weight:   Wt Readings from Last 1 Encounters:  04/23/21 67.2 kg     Estimated Nutritional Needs:  Kcal:  2350-2550 kcal Protein:  125-140 grams Fluid:  >/= 2.5 L/day      Jarome Matin, MS, RD, LDN Inpatient Clinical Dietitian RD pager # available in [place]  After hours/weekend pager # available in [facility]

## 2021-05-02 NOTE — Progress Notes (Addendum)
IP PROGRESS NOTE  Subjective:   Pain controlled at this time.  No difficulty with bladder control.  Undergoing radiation to T12 under the care of Dr. Tammi Klippel..  Objective: Vital signs in last 24 hours: Blood pressure 126/88, pulse 97, temperature 97.8 F (36.6 C), temperature source Oral, resp. rate 16, height 6\' 3"  (1.905 m), weight 67.2 kg, SpO2 95 %.  Intake/Output from previous day: 01/08 0701 - 01/09 0700 In: 960 [P.O.:960] Out: 1351 [Urine:1350; Stool:1]  Physical Exam:  HEENT: No thrush Abdomen: Soft and nontender, no hepatosplenomegaly Extremities: No leg edema Neurologic: The motor exam appears intact in the legs and feet bilaterally   Lab Results: No results for input(s): WBC, HGB, HCT, PLT in the last 72 hours.   BMET No results for input(s): NA, K, CL, CO2, GLUCOSE, BUN, CREATININE, CALCIUM in the last 72 hours.   No results found for: CEA1, CEA, [id number], CA125  Studies/Results: No results found.  Medications: I have reviewed the patient's current medications.  Assessment/Plan:  Left lung mass, small left hilar lymph nodes Destructive lesion involving T12 with epidural extension, T9 lytic lesion Biopsy of T12 paraspinal soft tissue 04/26/2021-nondiagnostic Palliative radiation to T9 and T12 04/27/2021 Right adrenal mass Pain secondary to #2 Leg weakness secondary to #2 and deconditioning Marfan syndrome Depression Tobacco and alcohol use  Christopher Dawson appears stable.  Initial biopsy of T12 lesion nondiagnostic.  Repeat biopsy of T12 was obtained on 04/28/2021.  Results are currently pending.  Clinically and radiographically, he does not have evidence of cord compression.  He has been seen by radiation oncology and radiation has been started to T12. 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  We will discuss systemic treatment options with the patient once biopsy results are available.  Pain is adequately controlled at this time we will continue current  pain medications.    Recommendations: Repeat biopsy of the T12 lesion for pathology and molecular testing was obtained 04/28/2021.  Call has been placed to pathology to obtain a preliminary result.  Dr. Saralyn Pilar has the case and will contact Dr. Benay Spice when a result is available. Continue Decadron, taper per Dr. Tammi Klippel Continue current pain medication Palliative radiation per Dr. Tammi Klippel Increase ambulation as tolerated.  Continue to work with PT.  They are currently recommending SNF for short-term rehab.   LOS: 9 days   Christopher Bussing, NP   05/02/2021, 7:54 AM This was a shared visit with Christopher Dawson.  Christopher Dawson was interviewed and examined.  He appears stable.  The preliminary result from the 04/28/2021 biopsy reveals a metastatic carcinoma.  I spoke to the pathologist.  He favors adenocarcinoma.  Immunohistochemical stains should be available by tomorrow.  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  He will complete palliative radiation to the spine with Dr. Tammi Klippel.  I will arrange for outpatient follow-up at the Cancer center with Dr. Julien Nordmann if the diagnosis of non-small cell lung cancer is confirmed.  He will be referred for outpatient staging PET scan.  Tumor tissue will be submitted for molecular testing.  I was present for greater than 50% of today's visit.  I performed medical decision making.  Julieanne Manson, MD

## 2021-05-02 NOTE — Progress Notes (Signed)
PROGRESS NOTE    Christopher Dawson  XLK:[id number] DOB: 1956-03-22 DOA: 04/23/2021 PCP: [name], MD   Brief Narrative: Christopher Dawson is a 66 y.o. male with a history of anxiety, depression, chronic lumbar radiculopathy, tobacco abuse, alcohol abuse. Patient presented secondary to intractable back pain and inability to ambulate. 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 Radiation therapy initiated   Assessment & Plan:   Intractable back pain Secondary to thoracic and lumbar spine lesions concerning for metastasis with associated T12 destructive lesion and associated pathologic fracture/epidural tumor confirmed on MRI. Neurosurgery consulted with recommendations for no need for decompressive surgery. Radiation oncology consulted and have initiated radiation therapy. -Palliative care recommendations: Oxycodone, Decadron -Cotninue gabapentin and titrate as needed  Left lung mass Concern for possible malignancy with metastasis. CT guided biopsy (1/3) performed of left paraspinal lesions at level of T12-L1 with inconclusive pathology. Medical oncology consulted and are on board. Repeat biopsy (1/5) with pathology pending. Patient will need to follow-up with oncology as an outpatient and await biopsy results. Patient to continue radiation therapy as an outpatient once discharged.  Right adrenal mass Indeterminate. Possible metastasis with recommendation for PET-CT.  Tobacco use -Continue nicotine patch  Depression -Continue clomipramine  Alcohol abuse Cessation discussed. Not currently in withdrawal. -Continue thiamine, folate  Anxiety -Atarax prn  Underweight Body mass index is 18.76 kg/m.[name] seen by dietitian. -Dietitian recommendations (04/25/21): continue Ensure Enlive but will increase from BID to TID, each supplement provides 350 kcal and 20 grams of  protein. complete NFPE when feasible.    DVT prophylaxis: Heparin subq Code Status:   Code Status: Partial Code Family Communication: None at bedside Disposition Plan: Discharge to SNF once bed is available. Medically stable for discharge.   Consultants:  Medical oncology Radiation oncology Neurosurgery Palliative care medicine  Procedures:  RADIATION THERAPY (1/4 >>  Antimicrobials: None    Subjective: Still with some pain in back and also flank. No other concerns.  Objective: Vitals:   05/01/21 1452 05/01/21 2235 05/02/21 0548 05/02/21 1151  BP: 113/71 126/77 126/88   Pulse: (!) 103 97 97   Resp: 18 16 16    Temp: 98.4 F (36.9 C) (!) 97.4 F (36.3 C) 97.8 F (36.6 C)   TempSrc: Oral Oral Oral   SpO2: 96% 98% 95%   Weight:    69.9 kg  Height:    6\' 4"  (1.93 m)    Intake/Output Summary (Last 24 hours) at 05/02/2021 1322 Last data filed at 05/02/2021 1136 Gross per 24 hour  Intake 1250 ml  Output 1601 ml  Net -351 ml    Filed Weights   04/23/21 2343 05/02/21 1151  Weight: 67.2 kg 69.9 kg    Examination:  General: Well appearing, no distress   Data Reviewed: I have personally reviewed following labs and imaging studies  CBC Lab Results  Component Value Date   WBC 15.8 (H) 04/25/2021   RBC 4.30 04/25/2021   HGB 13.0 04/25/2021   HCT 39.1 04/25/2021   MCV 90.9 04/25/2021   MCH 30.2 04/25/2021   PLT 339 04/25/2021   MCHC 33.2 04/25/2021   RDW 12.3 04/25/2021   LYMPHSABS 1.6 04/23/2021   MONOABS 0.9 04/23/2021   EOSABS 0.1 04/23/2021   BASOSABS 0.0 36/64/4034     Last metabolic panel Lab Results  Component Value Date   NA 139 04/25/2021   K 3.8 04/25/2021   CL 110 04/25/2021  CO2 25 04/25/2021   BUN 19 04/25/2021   CREATININE 0.53 (L) 04/25/2021   GLUCOSE 165 (H) 04/25/2021   GFRNONAA >60 04/25/2021   GFRAA >60 06/29/2019   CALCIUM 8.8 (L) 04/25/2021   PHOS 3.1 04/23/2021   PROT 6.5 04/24/2021   ALBUMIN 3.1 (L) 04/24/2021    LABGLOB 1.9 07/04/2018   AGRATIO 2.5 (H) 07/04/2018   BILITOT 0.7 04/24/2021   ALKPHOS 68 04/24/2021   AST 13 (L) 04/24/2021   ALT 15 04/24/2021   ANIONGAP 4 (L) 04/25/2021    CBG (last 3)  No results for input(s): GLUCAP in the last 72 hours.   GFR: Estimated Creatinine Clearance: 89.8 mL/min (A) (by C-G formula based on SCr of 0.53 mg/dL (L)).  Coagulation Profile: No results for input(s): INR, PROTIME in the last 168 hours.  Recent Results (from the past 240 hour(s))  Resp Panel by RT-PCR (Flu A&B, Covid) Nasopharyngeal Swab     Status: None   Collection Time: 04/23/21  8:05 PM   Specimen: Nasopharyngeal Swab; Nasopharyngeal(NP) swabs in vial transport medium  Result Value Ref Range Status   SARS Coronavirus 2 by RT PCR NEGATIVE NEGATIVE Final    Comment: (NOTE) SARS-CoV-2 target nucleic acids are NOT DETECTED.  The SARS-CoV-2 RNA is generally detectable in upper respiratory specimens during the acute phase of infection. The lowest concentration of SARS-CoV-2 viral copies this assay can detect is 138 copies/mL. A negative result does not preclude SARS-Cov-2 infection and should not be used as the sole basis for treatment or other patient management decisions. A negative result may occur with  improper specimen collection/handling, submission of specimen other than nasopharyngeal swab, presence of viral mutation(s) within the areas targeted by this assay, and inadequate number of viral copies(<138 copies/mL). A negative result must be combined with clinical observations, patient history, and epidemiological information. The expected result is Negative.  Fact Sheet for Patients:  EntrepreneurPulse.com.au  Fact Sheet for Healthcare Providers:  IncredibleEmployment.be  This test is no t yet approved or cleared by the [place] FDA and  has been authorized for detection and/or diagnosis of SARS-CoV-2 by FDA under an Emergency Use  Authorization (EUA). This EUA will remain  in effect (meaning this test can be used) for the duration of the COVID-19 declaration under Section 564(b)(1) of the Act, 21 U.S.C.section 360bbb-3(b)(1), unless the authorization is terminated  or revoked sooner.       Influenza A by PCR NEGATIVE NEGATIVE Final   Influenza B by PCR NEGATIVE NEGATIVE Final    Comment: (NOTE) The Xpert Xpress SARS-CoV-2/FLU/RSV plus assay is intended as an aid in the diagnosis of influenza from Nasopharyngeal swab specimens and should not be used as a sole basis for treatment. Nasal washings and aspirates are unacceptable for Xpert Xpress SARS-CoV-2/FLU/RSV testing.  Fact Sheet for Patients: EntrepreneurPulse.com.au  Fact Sheet for Healthcare Providers: IncredibleEmployment.be  This test is not yet approved or cleared by the [place] FDA and has been authorized for detection and/or diagnosis of SARS-CoV-2 by FDA under an Emergency Use Authorization (EUA). This EUA will remain in effect (meaning this test can be used) for the duration of the COVID-19 declaration under Section 564(b)(1) of the Act, 21 U.S.C. section 360bbb-3(b)(1), unless the authorization is terminated or revoked.  Performed at [facility], [place] [address], [place], [place] [address]         Radiology Studies: No results found.      Scheduled Meds:  clomiPRAMINE  100 mg Oral  QHS   dexamethasone  4 mg Oral Q8H   feeding supplement  237 mL Oral TID BM   folic acid  1 mg Oral Daily   gabapentin  100 mg Oral TID   heparin injection (subcutaneous)  5,000 Units Subcutaneous Q8H   losartan  25 mg Oral Daily   multivitamin with minerals  1 tablet Oral Daily   nicotine  14 mg Transdermal Q24H   pantoprazole  40 mg Oral Daily   polyethylene glycol  17 g Oral BID   thiamine  100 mg Oral Daily   Continuous Infusions:   LOS: 9 days     Cordelia Poche, MD [facility] 05/02/2021, 1:22 PM  If 7PM-7AM, please contact night-coverage [email or web address]

## 2021-05-02 NOTE — Progress Notes (Signed)
Physical Therapy Treatment Patient Details Name: Christopher Dawson MRN: [id number] DOB: Aug 29, 1955 Today's Date: 05/02/2021   History of Present Illness 66 y.o. male with PMH of anxiety/depression, chronic lumbar radiculopathy, tobacco abuse, [place], alcohol abuse. Pt verbally reports h/o cerebral palsy with 2 childhood surgeries "so I wouldn't walk on my toes."  Pt presented with  ongoing physical deconditioning for the last 6 months using cane/walker at home presented with intractable back pain and inability to ambulate, or get up to cook his meals or by groceries. Complains of constipation no urinary issues.  In the ED -CT scan-T12 and T9 vertebra process/destruction with concern for metastatic malignant lesions. 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    PT Comments    Pt requesting assist for pericare on arrival so requested nurse tech to assist prior to mobilizing.  Once completed, started pt education on w/c components, and pt performed w/c propulsion today.  Pt reports being limited by "burning" pain and also to have radiation at 4pm.   Recommendations for follow up therapy are one component of a multi-disciplinary discharge planning process, led by the attending physician.  Recommendations may be updated based on patient status, additional functional criteria and insurance authorization.  Follow Up Recommendations  Skilled nursing-short term rehab (<3 hours/day)     Assistance Recommended at Discharge Frequent or constant Supervision/Assistance  Patient can return home with the following A lot of help with bathing/dressing/bathroom;A little help with walking and/or transfers;Assistance with cooking/housework;Assist for transportation;Help with stairs or ramp for entrance   Equipment Recommendations  Wheelchair (measurements PT);Wheelchair cushion (measurements PT) (would benefit from high back w/c, and small measurement for seat (pt capable of fit and also reports narrow doors at home))     Recommendations for Other Services       Precautions / Restrictions Precautions Precautions: Fall     Mobility  Bed Mobility Overal bed mobility: Modified Independent                  Transfers Overall transfer level: Needs assistance Equipment used: None Transfers: Bed to chair/wheelchair/BSC            Lateral/Scoot Transfers: Min assist General transfer comment: w/c positioned beside bed with armrest removed, pt requiring min assist to ensure landing along surface, bed <->w/c    Ambulation/Gait                   Theme park manager mobility: Yes Wheelchair propulsion: Both upper extremities Wheelchair parts: Needs assistance Distance: 100 Wheelchair Assistance Details (indicate cue type and reason): min assist for tight spaces only, pt educated on turning, brakes, armrest, encouraged using LEs to self propel as well however pt mostly held them off floor  Modified Rankin (Stroke Patients Only)       Balance                                            Cognition Arousal/Alertness: Awake/alert Behavior During Therapy: Anxious Overall Cognitive Status: Within Functional Limits for tasks assessed                                 General Comments: cognition is functional but he is easily distracted by internal and external factors  Exercises      General Comments        Pertinent Vitals/Pain Pain Assessment: 0-10 Pain Score: 7  Pain Location: back Pain Descriptors / Indicators: Burning Pain Intervention(s): Repositioned;Monitored during session    Home Living                          Prior Function            PT Goals (current goals can now be found in the care plan section) Progress towards PT goals: Progressing toward goals    Frequency    Min 2X/week      PT Plan Current plan remains appropriate     Co-evaluation              AM-PAC PT "6 Clicks" Mobility   Outcome Measure  Help needed turning from your back to your side while in a flat bed without using bedrails?: A Little Help needed moving from lying on your back to sitting on the side of a flat bed without using bedrails?: A Little Help needed moving to and from a bed to a chair (including a wheelchair)?: A Little Help needed standing up from a chair using your arms (e.g., wheelchair or bedside chair)?: A Lot Help needed to walk in hospital room?: A Lot Help needed climbing 3-5 steps with a railing? : Total 6 Click Score: 14    End of Session   Activity Tolerance: Patient limited by pain Patient left: in bed;with call bell/phone within reach Nurse Communication: Mobility status PT Visit Diagnosis: Muscle weakness (generalized) (M62.81);Adult, failure to thrive (R62.7)     Time: 6834-1962 PT Time Calculation (min) (ACUTE ONLY): 15 min  Charges:  $Wheel Chair Management: 8-22 mins                     Arlyce Dice, DPT Acute Rehabilitation Services Pager: [phone] Office: [place] 05/02/2021, 4:12 PM

## 2021-05-03 ENCOUNTER — Ambulatory Visit
Admit: 2021-05-03 | Discharge: 2021-05-03 | Disposition: A | Discharge status: Home / Self Care | Payer: Medicare Other | Attending: Radiation Oncology | Admitting: Radiation Oncology

## 2021-05-03 DIAGNOSIS — M549 Dorsalgia, unspecified: Secondary | ICD-10-CM | POA: Diagnosis not present

## 2021-05-03 LAB — SURGICAL PATHOLOGY

## 2021-05-03 MED ORDER — OXYCODONE HCL 5 MG PO TABS
5.0000 mg | ORAL_TABLET | ORAL | Status: DC | PRN
  Administered 2021-05-03 – 2021-05-04 (×5): 10 mg via ORAL
  Administered 2021-05-04: 5 mg via ORAL
  Administered 2021-05-04 – 2021-05-13 (×18): 10 mg via ORAL
  Filled 2021-05-03 (×6): qty 2
  Filled 2021-05-03: qty 1
  Filled 2021-05-03 (×17): qty 2

## 2021-05-03 NOTE — Plan of Care (Signed)

## 2021-05-03 NOTE — Progress Notes (Signed)
PROGRESS NOTE    Christopher Dawson  PRF:[id number] DOB: July 03, 1955 DOA: 04/23/2021 PCP: Christopher Maw, MD   Brief Narrative: Christopher Dawson is a 66 y.o. male with a history of anxiety, depression, chronic lumbar radiculopathy, tobacco abuse, alcohol abuse. Patient presented secondary to intractable back pain and inability to ambulate. 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 Radiation therapy initiated   Assessment & Plan:   Intractable back pain Secondary to thoracic and lumbar spine lesions concerning for metastasis with associated T12 destructive lesion and associated pathologic fracture/epidural tumor confirmed on MRI. Neurosurgery consulted with recommendations for no need for decompressive surgery. Radiation oncology consulted and have initiated radiation therapy. -Palliative care recommendations: Oxycodone -Decadron dose/taper per radiation oncology -Cotninue gabapentin and titrate as needed  Left lung mass Concern for possible malignancy with metastasis. CT guided biopsy (1/3) performed of left paraspinal lesions at level of T12-L1 with inconclusive pathology. Medical oncology consulted and are on board. Repeat biopsy (1/5) with pathology pending. 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 Patient to continue radiation therapy as an outpatient once discharged.  Right adrenal mass Indeterminate. Possible metastasis with recommendation for PET-CT.  Tobacco use -Continue nicotine patch  Depression -Continue clomipramine  Alcohol abuse Cessation discussed. Not currently in withdrawal. -Continue thiamine, folate  Anxiety -Atarax prn  Underweight Body mass index is 18.44 kg/m.[name] seen by dietitian. -Dietitian recommendations (04/25/21): continue Ensure Enlive but will increase from BID to TID, each  supplement provides 350 kcal and 20 grams of protein. complete NFPE when feasible.    DVT prophylaxis: Heparin subq Code Status:   Code Status: Partial Code Family Communication: None at bedside Disposition Plan: Discharge to SNF once bed is available. Medically stable for discharge.   Consultants:  Medical oncology Radiation oncology Neurosurgery Palliative care medicine  Procedures:  RADIATION THERAPY (1/4 >>  Antimicrobials: None    Subjective: Continued back pain. No other issues overnight.  Objective: Vitals:   05/02/21 1447 05/02/21 2212 05/03/21 0500 05/03/21 0649  BP: 109/74 110/69  130/81  Pulse: (!) 102 (!) 104  (!) 103  Resp: 17 18  18   Temp: 97.8 F (36.6 C) 98.2 F (36.8 C)  98.3 F (36.8 C)  TempSrc:  Oral  Oral  SpO2: 97% 95%  96%  Weight:   68.7 kg   Height:        Intake/Output Summary (Last 24 hours) at 05/03/2021 1028 Last data filed at 05/03/2021 1006 Gross per 24 hour  Intake 640 ml  Output 1075 ml  Net -435 ml    Filed Weights   04/23/21 2343 05/02/21 1151 05/03/21 0500  Weight: 67.2 kg 69.9 kg 68.7 kg    Examination:  General: Thin appearing, no distress   Data Reviewed: I have personally reviewed following labs and imaging studies  CBC Lab Results  Component Value Date   WBC 15.8 (H) 04/25/2021   RBC 4.30 04/25/2021   HGB 13.0 04/25/2021   HCT 39.1 04/25/2021   MCV 90.9 04/25/2021   MCH 30.2 04/25/2021   PLT 339 04/25/2021   MCHC 33.2 04/25/2021   RDW 12.3 04/25/2021   LYMPHSABS 1.6 04/23/2021   MONOABS 0.9 04/23/2021   EOSABS 0.1 04/23/2021   BASOSABS 0.0 93/57/0177     Last metabolic panel Lab Results  Component Value Date   NA 139 04/25/2021   K 3.8 04/25/2021   CL 110 04/25/2021  CO2 25 04/25/2021   BUN 19 04/25/2021   CREATININE 0.53 (L) 04/25/2021   GLUCOSE 165 (H) 04/25/2021   GFRNONAA >60 04/25/2021   GFRAA >60 06/29/2019   CALCIUM 8.8 (L) 04/25/2021   PHOS 3.1 04/23/2021   PROT 6.5  04/24/2021   ALBUMIN 3.1 (L) 04/24/2021   LABGLOB 1.9 07/04/2018   AGRATIO 2.5 (H) 07/04/2018   BILITOT 0.7 04/24/2021   ALKPHOS 68 04/24/2021   AST 13 (L) 04/24/2021   ALT 15 04/24/2021   ANIONGAP 4 (L) 04/25/2021    CBG (last 3)  No results for input(s): GLUCAP in the last 72 hours.   GFR: Estimated Creatinine Clearance: 88.3 mL/min (A) (by C-G formula based on SCr of 0.53 mg/dL (L)).  Coagulation Profile: No results for input(s): INR, PROTIME in the last 168 hours.  Recent Results (from the past 240 hour(s))  Resp Panel by RT-PCR (Flu A&B, Covid) Nasopharyngeal Swab     Status: None   Collection Time: 04/23/21  8:05 PM   Specimen: Nasopharyngeal Swab; Nasopharyngeal(NP) swabs in vial transport medium  Result Value Ref Range Status   SARS Coronavirus 2 by RT PCR NEGATIVE NEGATIVE Final    Comment: (NOTE) SARS-CoV-2 target nucleic acids are NOT DETECTED.  The SARS-CoV-2 RNA is generally detectable in upper respiratory specimens during the acute phase of infection. The lowest concentration of SARS-CoV-2 viral copies this assay can detect is 138 copies/mL. A negative result does not preclude SARS-Cov-2 infection and should not be used as the sole basis for treatment or other patient management decisions. A negative result may occur with  improper specimen collection/handling, submission of specimen other than nasopharyngeal swab, presence of viral mutation(s) within the areas targeted by this assay, and inadequate number of viral copies(<138 copies/mL). A negative result must be combined with clinical observations, patient history, and epidemiological information. The expected result is Negative.  Fact Sheet for Patients:  EntrepreneurPulse.com.au  Fact Sheet for Healthcare Providers:  IncredibleEmployment.be  This test is no t yet approved or cleared by the [place] FDA and  has been authorized for detection and/or diagnosis of  SARS-CoV-2 by FDA under an Emergency Use Authorization (EUA). This EUA will remain  in effect (meaning this test can be used) for the duration of the COVID-19 declaration under Section 564(b)(1) of the Act, 21 U.S.C.section 360bbb-3(b)(1), unless the authorization is terminated  or revoked sooner.       Influenza A by PCR NEGATIVE NEGATIVE Final   Influenza B by PCR NEGATIVE NEGATIVE Final    Comment: (NOTE) The Xpert Xpress SARS-CoV-2/FLU/RSV plus assay is intended as an aid in the diagnosis of influenza from Nasopharyngeal swab specimens and should not be used as a sole basis for treatment. Nasal washings and aspirates are unacceptable for Xpert Xpress SARS-CoV-2/FLU/RSV testing.  Fact Sheet for Patients: EntrepreneurPulse.com.au  Fact Sheet for Healthcare Providers: IncredibleEmployment.be  This test is not yet approved or cleared by the [place] FDA and has been authorized for detection and/or diagnosis of SARS-CoV-2 by FDA under an Emergency Use Authorization (EUA). This EUA will remain in effect (meaning this test can be used) for the duration of the COVID-19 declaration under Section 564(b)(1) of the Act, 21 U.S.C. section 360bbb-3(b)(1), unless the authorization is terminated or revoked.  Performed at [facility], [place] [address], [place], [place] [address]         Radiology Studies: No results found.      Scheduled Meds:  clomiPRAMINE  100 mg Oral  QHS   dexamethasone  4 mg Oral Q8H   feeding supplement  237 mL Oral TID BM   folic acid  1 mg Oral Daily   gabapentin  100 mg Oral TID   heparin injection (subcutaneous)  5,000 Units Subcutaneous Q8H   losartan  25 mg Oral Daily   multivitamin with minerals  1 tablet Oral Daily   nicotine  14 mg Transdermal Q24H   pantoprazole  40 mg Oral Daily   polyethylene glycol  17 g Oral BID   thiamine  100 mg Oral Daily   Continuous Infusions:   LOS: 10  days     Cordelia Poche, MD [facility] 05/03/2021, 10:28 AM  If 7PM-7AM, please contact night-coverage [email or web address]

## 2021-05-03 NOTE — Plan of Care (Signed)
  Problem: Education: Goal: Knowledge of General Education information will improve Description: Including pain rating scale, medication(s)/side effects and non-pharmacologic comfort measures Outcome: Progressing   Problem: Pain Managment: Goal: General experience of comfort will improve Outcome: Progressing   Problem: Skin Integrity: Goal: Risk for impaired skin integrity will decrease Outcome: Progressing   

## 2021-05-03 NOTE — TOC Progression Note (Signed)
Transition of Care [facility]) - Progression Note   Patient Details  Name: Christopher Dawson MRN: [id number] Date of Birth: 03/06/1956  Transition of Care [facility]) CM/SW [place], LCSW Phone Number: 05/03/2021, 1:35 PM  Clinical Narrative: PASRR interview was completed. TOC awaiting PASRR number.  Expected Discharge Plan: [place] Barriers to Discharge: Continued Medical Work up  Expected Discharge Plan and Services Expected Discharge Plan: [place] In-house Referral: Clinical Social Work Post Acute Care Choice: [place] Living arrangements for the past 2 months: Single Family Home  Readmission Risk Interventions No flowsheet data found.

## 2021-05-03 NOTE — Progress Notes (Addendum)
IP PROGRESS NOTE  Subjective:   Reports increased pain intermittently.  Oxycodone 5 mg not fully effective.  No difficulty with bladder control.  Undergoing radiation to T12 under the care of Dr. Tammi Klippel..  Objective: Vital signs in last 24 hours: Blood pressure 130/81, pulse (!) 103, temperature 98.3 F (36.8 C), temperature source Oral, resp. rate 18, height 6' 4" (1.93 m), weight 68.7 kg, SpO2 96 %.  Intake/Output from previous day: 01/09 0701 - 01/10 0700 In: 650 [P.O.:650] Out: 875 [Urine:875]  Physical Exam:  HEENT: No thrush Abdomen: Soft and nontender, no hepatosplenomegaly Extremities: No leg edema Neurologic: The motor exam appears intact in the legs and feet bilaterally   Lab Results: No results for input(s): WBC, HGB, HCT, PLT in the last 72 hours.   BMET No results for input(s): NA, K, CL, CO2, GLUCOSE, BUN, CREATININE, CALCIUM in the last 72 hours.   No results found for: CEA1, CEA, [id number], CA125  Studies/Results: No results found.  Medications: I have reviewed the patient's current medications.  Assessment/Plan:  Left lung mass, small left hilar lymph nodes Destructive lesion involving T12 with epidural extension, T9 lytic lesion Biopsy of T12 paraspinal soft tissue 04/26/2021-nondiagnostic Palliative radiation to T9 and T12 04/27/2021 Right adrenal mass Pain secondary to #2 Leg weakness secondary to #2 and deconditioning Marfan syndrome Depression Tobacco and alcohol use  Mr. Colver appears stable.  Preliminary results of the biopsy performed on T12 on 04/28/2021 consistent with metastatic carcinoma.  Final IHC stains are pending but verbal report indicates that this favors adenocarcinoma.  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  Message has been sent to our thoracic navigator to arrange for outpatient follow-up with Dr. Julien Nordmann after completion of radiation and to send pathology for PD-L1 and foundation 1 testing.  He will also  need a PET scan as an outpatient.  Pain is not fully controlled at this time.  Oxycodone order has been adjusted to 5 to 10 mg every 4 hours as needed for pain.  Recommendations: Preliminary results of repeat biopsy performed 1/5 reveals metastatic carcinoma, favoring adenocarcinoma.  Final IHC stains pending.  Message sent to thoracic navigator to arrange for outpatient follow-up with Dr. Julien Nordmann after completion of radiation and to send tissue for foundation 1 and PD-L1 testing. Continue Decadron, taper per Dr. Tammi Klippel Oxycodone adjusted to 5 to 10 mg every 4 hours as needed for pain. Palliative radiation per Dr. Tammi Klippel Increase ambulation as tolerated.  Continue to work with PT.  They are currently recommending SNF for short-term rehab. Staging brain CT   LOS: 10 days   Mikey Bussing, NP   05/03/2021, 9:46 AM  Mr. Hensch was interviewed and examined.  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  I discussed the pathology findings with Mr. Bachtel.  We discussed treatment options.  We will submit tumor tissue for molecular testing.  Outpatient follow-up will be scheduled at the Cancer center with Dr. Julien Nordmann. We adjusted the narcotic regimen as he continues to have significant pain despite 5 mg of oxycodone.  He will be discharged to a skilled nursing facility to continue physical therapy.  I was present for greater than 50% of today's visit.  I performed medical decision making.

## 2021-05-03 NOTE — Plan of Care (Signed)
  Problem: Education: Goal: Knowledge of General Education information will improve Description: Including pain rating scale, medication(s)/side effects and non-pharmacologic comfort measures Outcome: Progressing   Problem: Activity: Goal: Risk for activity intolerance will decrease Outcome: Progressing   Problem: Pain Managment: Goal: General experience of comfort will improve Outcome: Progressing   

## 2021-05-03 NOTE — Care Management Important Message (Signed)
Important Message  Patient Details IM Letter placed in Patients room. Name: Christopher Dawson MRN: [id number] Date of Birth: 06/16/1955   Medicare Important Message Given:  Yes     Kerin Salen 05/03/2021, 11:48 AM

## 2021-05-04 ENCOUNTER — Ambulatory Visit
Admit: 2021-05-04 | Discharge: 2021-05-04 | Disposition: A | Discharge status: Home / Self Care | Payer: Medicare Other | Attending: Radiation Oncology | Admitting: Radiation Oncology

## 2021-05-04 ENCOUNTER — Inpatient Hospital Stay (HOSPITAL_COMMUNITY): Payer: Medicare Other

## 2021-05-04 MED ORDER — IOHEXOL 350 MG/ML SOLN
75.0000 mL | Freq: Once | INTRAVENOUS | Status: AC | PRN
  Administered 2021-05-04: 75 mL via INTRAVENOUS

## 2021-05-04 MED ORDER — SODIUM CHLORIDE (PF) 0.9 % IJ SOLN
INTRAMUSCULAR | Status: AC
  Filled 2021-05-04: qty 50

## 2021-05-04 NOTE — Progress Notes (Signed)
Occupational Therapy Treatment Patient Details Name: Christopher Dawson Age MRN: [id number] DOB: 13-May-1955 Today's Date: 05/04/2021   History of present illness 66 y.o. male with PMH of anxiety/depression, chronic lumbar radiculopathy, tobacco abuse, Marfans, alcohol abuse. Pt verbally reports h/o cerebral palsy with 2 childhood surgeries "so I wouldn't walk on my toes."  Pt presented with  ongoing physical deconditioning for the last 6 months using cane/walker at home presented with intractable back pain and inability to ambulate, or get up to cook his meals or by groceries. Complains of constipation no urinary issues.  In the ED -CT scan-T12 and T9 vertebra process/destruction with concern for metastatic malignant lesions. 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   OT comments  Patient reported having increased pain in bed with patient noted to have increased cervical extension and poor positioning in bed. Patient was educated on proper body mechanics to reduce discomfort with activity and rest periods. Patient declined AE follow up training on this date. Patient would continue to benefit from skilled OT services at this time while admitted and after d/c to address noted deficits in order to improve overall safety and independence in ADLs.     Recommendations for follow up therapy are one component of a multi-disciplinary discharge planning process, led by the attending physician.  Recommendations may be updated based on patient status, additional functional criteria and insurance authorization.    Follow Up Recommendations  Skilled nursing-short term rehab (<3 hours/day)    Assistance Recommended at Discharge Frequent or constant Supervision/Assistance  Patient can return home with the following  A little help with walking and/or transfers;A lot of help with bathing/dressing/bathroom;Assistance with cooking/housework   Equipment Recommendations  BSC/3in1    Recommendations for Other Services       Precautions / Restrictions Precautions Precautions: Fall Precaution Comments: reports 3 falls in past 6 months, all occurred at curb to enter his condo, back precautions? Restrictions Weight Bearing Restrictions: No       Mobility Bed Mobility Overal bed mobility: Modified Independent             General bed mobility comments: Able to transfer himself with use of bed rails. with education on proper body mechanics to attempt to reduce pain felt with movement.    Transfers                         Balance                                           ADL either performed or assessed with clinical judgement   ADL Overall ADL's : Needs assistance/impaired                       Lower Body Dressing Details (indicate cue type and reason): patient declined to attempt to use AE at this time. patient reported " the other girl showed me them and i am not worried about it" patient declined to demonstrate carryover from previous session.               General ADL Comments: patient participated in education onproper body mechainics and benfits to pain. patient was noted to have 3 pillows under head while lying flat in bed attempting to look at phone. patient was educated on proper positioning for cervical spine to reduce pressure and encourage natural curvature. patient demonstrated  understanding and reported a slight improvement to comfort. patient was educated on sidelying with pillow placement. patient demonstrated understanding at this time. patient was able to preform supine to sit with min A with pillow between legs until on EOB to reduce pressure on low back. patietn needed cues to be able to move shoulders and hips at the same time for tasks. patient required increased education for transition from sit to supine to prevent twisting. patient was noted to lift head back to previous chin tucked position at end of session.    Extremity/Trunk  Assessment              Vision       Perception     Praxis      Cognition Arousal/Alertness: Awake/alert Behavior During Therapy: Impulsive Overall Cognitive Status: Within Functional Limits for tasks assessed                                 General Comments: patient is very easily distracted with patient keeping video on phone while attempting to speak to therapist prior to cues to attend to task.          Exercises     Shoulder Instructions       General Comments      Pertinent Vitals/ Pain       Pain Assessment: Faces Faces Pain Scale: Hurts little more Pain Location: back Pain Descriptors / Indicators: Burning Pain Intervention(s): Monitored during session;Repositioned  Home Living                                          Prior Functioning/Environment              Frequency  Min 2X/week        Progress Toward Goals  OT Goals(current goals can now be found in the care plan section)  Progress towards OT goals: Progressing toward goals     Plan Discharge plan remains appropriate    Co-evaluation                 AM-PAC OT "6 Clicks" Daily Activity     Outcome Measure   Help from another person eating meals?: None Help from another person taking care of personal grooming?: A Little Help from another person toileting, which includes using toliet, bedpan, or urinal?: A Lot Help from another person bathing (including washing, rinsing, drying)?: A Lot Help from another person to put on and taking off regular upper body clothing?: A Little Help from another person to put on and taking off regular lower body clothing?: A Lot 6 Click Score: 16    End of Session    OT Visit Diagnosis: Other abnormalities of gait and mobility (R26.89);Pain   Activity Tolerance Patient limited by pain   Patient Left in bed;with call bell/phone within reach   Nurse Communication Other (comment) (cleared patient to  participate)        Time: 1962-2297 OT Time Calculation (min): 20 min  Charges: OT General Charges $OT Visit: 1 Visit OT Treatments $Therapeutic Activity: 8-22 mins  Jackelyn Poling OTR/L, MS Acute Rehabilitation Department Office# [phone] Pager# [phone]   Marcellina Millin 05/04/2021, 1:56 PM

## 2021-05-04 NOTE — Progress Notes (Signed)
[facility] PROGRESS NOTE  Patient: Christopher Dawson DXI:[id number]   PCP: [name], MD DOB: [birth year]   DOA: 04/23/2021   DOS: 05/04/2021    Subjective: No acute events overnight.  Objective:  Vitals:   05/04/21 1313 05/04/21 2141  BP: 120/88 119/69  Pulse: (!) 109 (!) 101  Resp: 18 17  Temp: 98 F (36.7 C) 98.7 F (37.1 C)  SpO2: 97% 95%  Unable to evaluate.  Assessment and plan: Cancer related pain. Currently undergoing palliative radiation. Continue current pain regimen. Appreciate oncology assistance.   Author: Berle Mull, MD [facility] 05/04/2021 10:23 PM   If 7PM-7AM, please contact night-coverage at [email or web address]

## 2021-05-04 NOTE — Progress Notes (Signed)
OT Cancellation Note  Patient Details Name: Christopher Dawson MRN: [id number] DOB: 04-23-1956   Cancelled Treatment:    Reason Eval/Treat Not Completed: Patient at procedure or test/ unavailable Patient is off floor at CT. OT will continue to follow and check back as able. Jackelyn Poling OTR/L, [place] Acute Rehabilitation Department Office# [phone] Pager# [phone]   05/04/2021, 10:04 AM

## 2021-05-04 NOTE — TOC Progression Note (Signed)
Transition of Care [facility]) - Progression Note    Patient Details  Name: Christopher Dawson MRN: [id number] Date of Birth: 08/12/1955  [facility]) CM/SW Contact  Lennart Pall, LCSW Phone Number: 05/04/2021, 12:41 PM  Clinical Narrative:    Level 2 PASRR completed and FL2 updated; widened SNF bed search again.   Expected Discharge Plan: [place] Barriers to Discharge: Continued Medical Work up  Expected Discharge Plan and Services Expected Discharge Plan: [place] In-house Referral: Clinical Social Work   Post Acute Care Choice: [place] Living arrangements for the past 2 months: Single Family Home                                       Social Determinants of Health (SDOH) Interventions    Readmission Risk Interventions No flowsheet data found.

## 2021-05-04 NOTE — Progress Notes (Signed)
PT Cancellation Note  Patient Details Name: Collins Kerby MRN: [id number] DOB: 08-Jan-1956   Cancelled Treatment:    Reason Eval/Treat Not Completed: Patient at procedure or test/unavailable Pt worked with OT this morning and currently in radiation.  Will check back as schedule permits.   Myrtis Hopping [place] 05/04/2021, 3:22 PM Arlyce Dice, DPT Acute Rehabilitation Services Pager: [phone] Office: [phone]

## 2021-05-05 ENCOUNTER — Other Ambulatory Visit: Payer: Self-pay | Admitting: Oncology

## 2021-05-05 ENCOUNTER — Encounter: Payer: Self-pay | Admitting: *Deleted

## 2021-05-05 ENCOUNTER — Telehealth: Payer: Self-pay | Admitting: Internal Medicine

## 2021-05-05 ENCOUNTER — Ambulatory Visit
Admit: 2021-05-05 | Discharge: 2021-05-05 | Disposition: A | Discharge status: Home / Self Care | Payer: Medicare Other | Attending: Radiation Oncology | Admitting: Radiation Oncology

## 2021-05-05 DIAGNOSIS — C7951 Secondary malignant neoplasm of bone: Secondary | ICD-10-CM

## 2021-05-05 DIAGNOSIS — E278 Other specified disorders of adrenal gland: Secondary | ICD-10-CM | POA: Diagnosis present

## 2021-05-05 DIAGNOSIS — F32A Depression, unspecified: Secondary | ICD-10-CM | POA: Diagnosis present

## 2021-05-05 DIAGNOSIS — J449 Chronic obstructive pulmonary disease, unspecified: Secondary | ICD-10-CM | POA: Diagnosis present

## 2021-05-05 DIAGNOSIS — E871 Hypo-osmolality and hyponatremia: Secondary | ICD-10-CM | POA: Diagnosis present

## 2021-05-05 DIAGNOSIS — K5900 Constipation, unspecified: Secondary | ICD-10-CM | POA: Diagnosis present

## 2021-05-05 DIAGNOSIS — C3490 Malignant neoplasm of unspecified part of unspecified bronchus or lung: Secondary | ICD-10-CM | POA: Diagnosis present

## 2021-05-05 DIAGNOSIS — Z789 Other specified health status: Secondary | ICD-10-CM | POA: Diagnosis present

## 2021-05-05 DIAGNOSIS — G893 Neoplasm related pain (acute) (chronic): Secondary | ICD-10-CM | POA: Diagnosis present

## 2021-05-05 LAB — CBC
HCT: 42.7 % (ref 39.0–52.0)
Hemoglobin: 14.4 g/dL (ref 13.0–17.0)
MCH: 30.3 pg (ref 26.0–34.0)
MCHC: 33.7 g/dL (ref 30.0–36.0)
MCV: 89.7 fL (ref 80.0–100.0)
Platelets: 279 10*3/uL (ref 150–400)
RBC: 4.76 MIL/uL (ref 4.22–5.81)
RDW: 13.6 % (ref 11.5–15.5)
WBC: 15.7 10*3/uL — ABNORMAL HIGH (ref 4.0–10.5)
nRBC: 0 % (ref 0.0–0.2)

## 2021-05-05 LAB — BASIC METABOLIC PANEL
Anion gap: 9 (ref 5–15)
BUN: 31 mg/dL — ABNORMAL HIGH (ref 8–23)
CO2: 25 mmol/L (ref 22–32)
Calcium: 8.9 mg/dL (ref 8.9–10.3)
Chloride: 100 mmol/L (ref 98–111)
Creatinine, Ser: 0.54 mg/dL — ABNORMAL LOW (ref 0.61–1.24)
GFR, Estimated: >60 mL/min (ref >60)
Glucose, Bld: 115 mg/dL — ABNORMAL HIGH (ref 70–99)
Potassium: 4.4 mmol/L (ref 3.5–5.1)
Sodium: 134 mmol/L — ABNORMAL LOW (ref 135–145)

## 2021-05-05 NOTE — Assessment & Plan Note (Signed)
Sodium level stable.  Likely from poor p.o. intake.

## 2021-05-05 NOTE — Telephone Encounter (Signed)
Scheduled appt per 1/12 referral. Pt is aware of appt date and time.

## 2021-05-05 NOTE — Progress Notes (Addendum)
Progress Note   Patient: Christopher Dawson RSW:[id number] DOB: 08/15/1955 DOA: 04/23/2021     12 DOS: the patient was seen and examined on 05/05/2021   Brief hospital course: Christopher Dawson is a 66 y.o. male with a history of anxiety, depression, chronic lumbar radiculopathy, tobacco abuse, alcohol abuse. Patient presented secondary to intractable back pain and inability to ambulate. 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 Radiation therapy initiated. 12/31 admitted with intractable back pain.  T9/T12 lytic lesion, lung mass and adrenal mass.  Oncology and neurosurgery consulted. 1/3 CT-guided biopsy.  Procedure aborted after 1 sample due to bleeding. 1/5 repeat CT-guided biopsy.  In addition therapy started based on limited report of adenocarcinoma. 1/10 CT head with and without contrast for staging negative for metastatic disease.  Assessment and Plan * Cancer related pain- (present on admission) Secondary to thoracic and lumbar spine lesions concerning for metastasis with associated T12 destructive lesion and associated pathologic fracture/epidural tumor confirmed on MRI.  Neurosurgery consulted with recommendations for no need for decompressive surgery.  Radiation oncology consulted -Palliative care recommendations: Oxycodone -Decadron dose/taper per radiation oncology -Cotninue gabapentin and titrate as needed -Radiation therapy started on 1/3.  Patient will receive 10 session last day 1/17.  Right adrenal mass ([place])- (present on admission) Indeterminate. Possible metastasis with recommendation for PET-CT.  COPD (chronic obstructive pulmonary disease) ([place])- (present on admission) Appears to be stable for now.  No exacerbation.  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)- (present on admission) Concern for possible malignancy with metastasis. CT guided biopsy (1/3)  performed of left paraspinal lesions at level of T12-L1 with inconclusive pathology. Medical oncology consulted and are on board. Repeat biopsy (1/5) with pathology pending. 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 Patient to continue radiation therapy.  Will follow up with oncology Dr. Julien Nordmann outpatient.  They will send tissue for further IHC and foundation 1 testing. Brain CT negative for any acute deformity.  Constipation- (present on admission) Continue aggressive bowel regimen.  Hyponatremia- (present on admission) Sodium level stable.  Likely from poor p.o. intake.  Alcohol use- (present on admission) Prior history. Cessation discussed. Currently beyond withdrawal time limit. Continue multivitamins.  Depression- (present on admission) Mood is stable but Continue current regimen.  Hyperlipidemia- (present on admission) Not on statin.     Subjective: No nausea no vomiting no fever no chills.  No chest pain.  No abdominal pain.  Objective Vitals:   05/04/21 2141 05/05/21 0531 05/05/21 1335 05/05/21 1336  BP: 119/69 128/83 124/80 124/80  Pulse: (!) 101 (!) 105 (!) 113 96  Resp: 17 18 20 16   Temp: 98.7 F (37.1 C) 98.4 F (36.9 C) (!) 97.5 F (36.4 C) (!) 97.5 F (36.4 C)  TempSrc:   Oral Oral  SpO2: 95% 96%  98%  Weight:      Height:        General: Appear in mild distress, no Rash; Oral Mucosa Clear, moist. no Abnormal Neck Mass Or lumps, Conjunctiva normal  Cardiovascular: S1 and S2 Present, no Murmur, Respiratory: good respiratory effort, Bilateral Air entry present and CTA, no Crackles, no wheezes Abdomen: Bowel Sound present, Soft and no tenderness Extremities: no Pedal edema Neurology: alert and oriented to time, place, and person affect appropriate. no new focal deficit Gait not checked due to patient safety concerns he has another cellulitis  Data Reviewed:  My review of labs, imaging,  notes  and other tests shows no new significant findings.   Family Communication: None at bedside.  Disposition: Status is: Inpatient  Remains inpatient appropriate because: Plan to discharge to SNF.  Medically unable to discharge secondary to no facilities available who is willing to transfer for his radiation.     Time spent: 35 minutes  Author: Berle Mull, MD 05/05/2021 9:20 PM  For on call review [email or web address].

## 2021-05-05 NOTE — Assessment & Plan Note (Signed)
Mood is stable but Continue current regimen.

## 2021-05-05 NOTE — Assessment & Plan Note (Signed)
Concern for possible malignancy with metastasis. CT guided biopsy (1/3) performed of left paraspinal lesions at level of T12-L1 with inconclusive pathology. Medical oncology consulted and are on board. Repeat biopsy (1/5) with pathology pending. 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 Patient to continue radiation therapy.  Will follow up with oncology Dr. Julien Nordmann outpatient.  They will send tissue for further IHC and foundation 1 testing. Brain CT negative for any acute deformity.

## 2021-05-05 NOTE — Assessment & Plan Note (Addendum)
Not on statin 

## 2021-05-05 NOTE — Assessment & Plan Note (Signed)
Continue aggressive bowel regimen.

## 2021-05-05 NOTE — Assessment & Plan Note (Addendum)
Appears to be stable for now.  No exacerbation.

## 2021-05-05 NOTE — Hospital Course (Addendum)
Christopher Dawson is a 66 y.o. male with a history of anxiety, depression, chronic lumbar radiculopathy, tobacco abuse, alcohol abuse. Patient presented secondary to intractable back pain and inability to ambulate. 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 Radiation therapy initiated. 12/31 admitted with intractable back pain.  T9/T12 lytic lesion, lung mass and adrenal mass.  Oncology and neurosurgery consulted. 1/3 CT-guided biopsy.  Procedure aborted after 1 sample due to bleeding. 1/5 repeat CT-guided biopsy.  In addition therapy started based on limited report of adenocarcinoma. 1/10 CT head with and without contrast for staging negative for metastatic disease. 1/17, Radiation completed.  medically stable.  Awaiting placement.

## 2021-05-05 NOTE — Progress Notes (Signed)
Physical Therapy Treatment Patient Details Name: Christopher Dawson MRN: [id number] DOB: Sep 21, 1955 Today's Date: 05/05/2021   History of Present Illness 66 y.o. male with PMH of anxiety/depression, chronic lumbar radiculopathy, tobacco abuse, Marfans, alcohol abuse. Pt verbally reports h/o cerebral palsy with 2 childhood surgeries "so I wouldn't walk on my toes."  Pt presented with  ongoing physical deconditioning for the last 6 months using cane/walker at home presented with intractable back pain and inability to ambulate, or get up to cook his meals or by groceries. Complains of constipation no urinary issues.  In the ED -CT scan-T12 and T9 vertebra process/destruction with concern for metastatic malignant lesions. 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    PT Comments    Pt is able to independently perform supine to sit using bed rail. Once sitting at edge of bed he stated he was too fatigued to attempt standing or a transfer to a WC. He agreed to bed level LE strengthening exercises. Encouraged pt to perform these exercises independently to minimize deconditioning during hospitalization.     Recommendations for follow up therapy are one component of a multi-disciplinary discharge planning process, led by the attending physician.  Recommendations may be updated based on patient status, additional functional criteria and insurance authorization.  Follow Up Recommendations  Skilled nursing-short term rehab (<3 hours/day)     Assistance Recommended at Discharge Frequent or constant Supervision/Assistance  Patient can return home with the following A lot of help with bathing/dressing/bathroom;A little help with walking and/or transfers;Assistance with cooking/housework;Assist for transportation;Help with stairs or ramp for entrance   Equipment Recommendations  Wheelchair (measurements PT);Wheelchair cushion (measurements PT)    Recommendations for Other Services       Precautions / Restrictions  Precautions Precautions: Fall Precaution Comments: reports 3 falls in past 6 months, all occurred at curb to enter his condo, back precautions? Restrictions Weight Bearing Restrictions: No     Mobility  Bed Mobility Overal bed mobility: Modified Independent             General bed mobility comments: used bed rails for supine to sit then back to supine, no physical assist needed    Transfers                   General transfer comment: pt refused transfers 2* fatigue    Ambulation/Gait                   Stairs             Wheelchair Mobility    Modified Rankin (Stroke Patients Only)       Balance   Sitting-balance support: Feet supported;No upper extremity supported Sitting balance-Leahy Scale: Fair                                      Cognition Arousal/Alertness: Awake/alert Behavior During Therapy: Anxious Overall Cognitive Status: Within Functional Limits for tasks assessed                                 General Comments: cognition is functional but he is easily distracted by internal and external factors        Exercises General Exercises - Lower Extremity Ankle Circles/Pumps: AROM;Both;10 reps;Supine Quad Sets: AROM;Both;5 reps;Supine Heel Slides: AAROM;Both;15 reps;Supine Hip ABduction/ADduction: AAROM;Both;15 reps;Supine    General Comments  Pertinent Vitals/Pain Faces Pain Scale: Hurts little more Pain Location: back Pain Descriptors / Indicators: Aching Pain Intervention(s): Limited activity within patient's tolerance;Monitored during session;Premedicated before session    Home Living                          Prior Function            PT Goals (current goals can now be found in the care plan section) Acute Rehab PT Goals Patient Stated Goal: reduce back pain PT Goal Formulation: With patient Time For Goal Achievement: 05/11/21 Potential to Achieve Goals:  Fair Progress towards PT goals: Progressing toward goals    Frequency    Min 2X/week      PT Plan Current plan remains appropriate    Co-evaluation              AM-PAC PT "6 Clicks" Mobility   Outcome Measure  Help needed turning from your back to your side while in a flat bed without using bedrails?: A Little Help needed moving from lying on your back to sitting on the side of a flat bed without using bedrails?: A Little Help needed moving to and from a bed to a chair (including a wheelchair)?: A Little Help needed standing up from a chair using your arms (e.g., wheelchair or bedside chair)?: A Little Help needed to walk in hospital room?: A Lot Help needed climbing 3-5 steps with a railing? : Total 6 Click Score: 15    End of Session   Activity Tolerance: Patient limited by fatigue Patient left: in bed;with call bell/phone within reach;with bed alarm set Nurse Communication: Mobility status PT Visit Diagnosis: Muscle weakness (generalized) (M62.81);Adult, failure to thrive (R62.7)     Time: 1733-1750 PT Time Calculation (min) (ACUTE ONLY): 17 min  Charges:  $Therapeutic Activity: 8-22 mins           Blondell Reveal Kistler PT 05/05/2021  Acute Rehabilitation Services Pager [phone] Office [phone]

## 2021-05-05 NOTE — Progress Notes (Signed)
Oncology Nurse Navigator Documentation  Oncology Nurse Navigator Flowsheets 05/05/2021  Navigator Follow Up Date: 05/09/2021  Navigator Follow Up Reason: Appointment Review;Review Note  Navigator Location [facility]  Referral Date to RadOnc/MedOnc 05/05/2021  Navigator Encounter Type Other:  Treatment Phase Other  Barriers/Navigation Needs Coordination of Care/I received referral on [name] today. I updated new patient scheduler to call and schedule him to be seen next week on 1/17.    Interventions Coordination of Care  Acuity Level 2-Minimal Needs (1-2 Barriers Identified)  Coordination of Care Other  Time Spent with Patient 30

## 2021-05-05 NOTE — Assessment & Plan Note (Addendum)
Secondary to thoracic and lumbar spine lesions concerning for metastasis with associated T12 destructive lesion and associated pathologic fracture/epidural tumor confirmed on MRI.  Neurosurgery consulted with recommendations for no need for decompressive surgery.  Radiation oncology consulted -Palliative care recommendations: Oxycodone -Decadron dose/taper per radiation oncology -Cotninue gabapentin and titrate as needed -Radiation therapy started on 1/3.  Patient received 10 session last day 1/17. Outpatient follow-up with medical oncology.

## 2021-05-05 NOTE — Assessment & Plan Note (Addendum)
CT guided biopsy (1/3) performed of left paraspinal lesions at level of T12-L1 with inconclusive pathology.  Medical oncology consulted and are on board.  Repeat biopsy (1/5) with pathology pending.  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 Patient to continue radiation therapy.  Will follow up with oncology Dr. Julien Nordmann outpatient.  They will send tissue for further IHC and foundation 1 testing. Brain CT negative for any acute deformity.

## 2021-05-05 NOTE — Assessment & Plan Note (Signed)
Prior history. Cessation discussed. Currently beyond withdrawal time limit. Continue multivitamins.

## 2021-05-05 NOTE — Assessment & Plan Note (Signed)
Indeterminate. Possible metastasis with recommendation for PET-CT.

## 2021-05-06 ENCOUNTER — Other Ambulatory Visit: Payer: Self-pay | Admitting: *Deleted

## 2021-05-06 ENCOUNTER — Encounter: Payer: Self-pay | Admitting: *Deleted

## 2021-05-06 ENCOUNTER — Ambulatory Visit
Admit: 2021-05-06 | Discharge: 2021-05-06 | Disposition: A | Discharge status: Home / Self Care | Payer: Medicare Other | Attending: Radiation Oncology | Admitting: Radiation Oncology

## 2021-05-06 MED ORDER — DOCUSATE SODIUM 100 MG PO CAPS
100.0000 mg | ORAL_CAPSULE | Freq: Two times a day (BID) | ORAL | Status: DC
  Administered 2021-05-06: 100 mg via ORAL
  Filled 2021-05-06 (×3): qty 1

## 2021-05-06 NOTE — Progress Notes (Signed)
Oncology Nurse Navigator Documentation  Oncology Nurse Navigator Flowsheets 05/06/2021 05/05/2021  Abnormal Finding Date 04/23/2021 -  Confirmed Diagnosis Date 04/28/2021 -  Diagnosis Status Pending Molecular Studies -  Planned Course of Treatment Radiation -  Phase of Treatment Radiation -  Radiation Actual Start Date: 04/26/2021 -  Navigator Follow Up Date: 05/10/2021 05/09/2021  Navigator Follow Up Reason: New Patient Appointment Appointment Review;Review Note  Navigator Location [facility]  Referral Date to RadOnc/MedOnc - 05/05/2021  Navigator Encounter Type Pathology Review/per Dr. Julien Nordmann, [place] and PDL 1 requested the pathology team to send to [facility].  Other:  Treatment Initiated Date 04/26/2021 -  Patient Visit Type Other -  Treatment Phase Other Other  Barriers/Navigation Needs Coordination of Care Coordination of Care  Interventions Coordination of Care Coordination of Care  Acuity Level 2-Minimal Needs (1-2 Barriers Identified) Level 2-Minimal Needs (1-2 Barriers Identified)  Coordination of Care Pathology Other  Time Spent with Patient 30 30

## 2021-05-06 NOTE — Care Management Important Message (Signed)
Important Message  Patient Details IM Letter placed in Patients room. Name: Christopher Dawson MRN: [id number] Date of Birth: 1955/04/29   Medicare Important Message Given:  Yes     Kerin Salen 05/06/2021, 11:58 AM

## 2021-05-06 NOTE — Plan of Care (Signed)
  Problem: Education: Goal: Knowledge of General Education information will improve Description Including pain rating scale, medication(s)/side effects and non-pharmacologic comfort measures Outcome: Progressing   Problem: Health Behavior/Discharge Planning: Goal: Ability to manage health-related needs will improve Outcome: Progressing   

## 2021-05-06 NOTE — Progress Notes (Signed)
Request to add [place] -1 to path collected on 1/5 sent to [employer] via e mail per request of Myrtha Mantis, NP.

## 2021-05-06 NOTE — Progress Notes (Signed)
Occupational Therapy Treatment Patient Details Name: Delta Deshmukh MRN: [id number] DOB: 07-31-1955 Today's Date: 05/06/2021   History of present illness 66 y.o. male with PMH of anxiety/depression, chronic lumbar radiculopathy, tobacco abuse, [place]ans, alcohol abuse. Pt verbally reports h/o cerebral palsy with 2 childhood surgeries "so I wouldn't walk on my toes."  Pt presented with  ongoing physical deconditioning for the last 6 months using cane/walker at home presented with intractable back pain and inability to ambulate, or get up to cook his meals or by groceries. Complains of constipation no urinary issues.  In the ED -CT scan-T12 and T9 vertebra process/destruction with concern for metastatic malignant lesions. 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   OT comments  Improved ability to tolerate activity today. Worked on sitting and standing with walker in preparation for furthering ADLs. Continue to recommend short term rehab.   Recommendations for follow up therapy are one component of a multi-disciplinary discharge planning process, led by the attending physician.  Recommendations may be updated based on patient status, additional functional criteria and insurance authorization.    Follow Up Recommendations  Skilled nursing-short term rehab (<3 hours/day)    Assistance Recommended at Discharge    Patient can return home with the following  A little help with walking and/or transfers;A lot of help with bathing/dressing/bathroom;Assistance with cooking/housework   Equipment Recommendations  BSC/3in1    Recommendations for Other Services      Precautions / Restrictions Precautions Precautions: Fall Precaution Comments: reports 3 falls in past 6 months, all occurred at curb to enter his condo, back precautions? Restrictions Weight Bearing Restrictions: No       Mobility Bed Mobility                    Transfers                         Balance Overall balance  assessment: Needs assistance Sitting-balance support: No upper extremity supported Sitting balance-Leahy Scale: Fair     Standing balance support: Reliant on assistive device for balance Standing balance-Leahy Scale: Poor                             ADL either performed or assessed with clinical judgement   ADL                                         General ADL Comments: In preparation for ADLs patient worked on functional mobility and activity tolerance. Patient supervision with use of bed rails to transfer to side of bed. Patient reports legs feeling weak and being scared to fall thus staying by side of bed performed. Min guard with RW to stand from very elevated bed height (patient tall)- using foot board from him to push up on to stand. Increased time to transfer hand to the walker out of fear.  Able to take steps to head of bed - approx 3 feet. He reports pain and returned to supine with superivsion.    Extremity/Trunk Assessment              Vision       Perception     Praxis      Cognition Arousal/Alertness: Awake/alert Behavior During Therapy: WFL for tasks assessed/performed Overall Cognitive Status: Within Functional Limits for tasks assessed  Exercises     Shoulder Instructions       General Comments      Pertinent Vitals/ Pain       Pain Assessment: Faces Faces Pain Scale: Hurts little more Pain Location: back Pain Descriptors / Indicators: Aching;Grimacing;Guarding Pain Intervention(s): Monitored during session;Premedicated before session  Home Living                                          Prior Functioning/Environment              Frequency  Min 2X/week        Progress Toward Goals  OT Goals(current goals can now be found in the care plan section)  Progress towards OT goals: Progressing toward goals  Acute Rehab OT  Goals Patient Stated Goal: to have less pain OT Goal Formulation: With patient Time For Goal Achievement: 05/12/21 Potential to Achieve Goals: Good  Plan Discharge plan remains appropriate    Co-evaluation                 AM-PAC OT "6 Clicks" Daily Activity     Outcome Measure   Help from another person eating meals?: None Help from another person taking care of personal grooming?: A Little Help from another person toileting, which includes using toliet, bedpan, or urinal?: A Lot Help from another person bathing (including washing, rinsing, drying)?: A Lot Help from another person to put on and taking off regular upper body clothing?: A Little Help from another person to put on and taking off regular lower body clothing?: A Lot 6 Click Score: 16    End of Session Equipment Utilized During Treatment: Rolling walker (2 wheels)  OT Visit Diagnosis: Other abnormalities of gait and mobility (R26.89);Pain   Activity Tolerance Patient limited by pain   Patient Left in bed;with call bell/phone within reach   Nurse Communication Mobility status        Time: 3151-7616 OT Time Calculation (min): 17 min  Charges: OT General Charges $OT Visit: 1 Visit OT Treatments $Therapeutic Activity: 8-22 mins  Derl Barrow, OTR/L [place]  Office [phone] Pager: [place] 05/06/2021, 12:23 PM

## 2021-05-06 NOTE — Progress Notes (Signed)
Progress Note   Patient: Christopher Dawson TXM:[id number] DOB: Mar 19, 1956 DOA: 04/23/2021     13 DOS: the patient was seen and examined on 05/06/2021   Brief hospital course: Christopher Dawson is a 66 y.o. male with a history of anxiety, depression, chronic lumbar radiculopathy, tobacco abuse, alcohol abuse. Patient presented secondary to intractable back pain and inability to ambulate. 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 Radiation therapy initiated. 12/31 admitted with intractable back pain.  T9/T12 lytic lesion, lung mass and adrenal mass.  Oncology and neurosurgery consulted. 1/3 CT-guided biopsy.  Procedure aborted after 1 sample due to bleeding. 1/5 repeat CT-guided biopsy.  In addition therapy started based on limited report of adenocarcinoma. 1/10 CT head with and without contrast for staging negative for metastatic disease.   Assessment and Plan * Cancer related pain- (present on admission) Secondary to thoracic and lumbar spine lesions concerning for metastasis with associated T12 destructive lesion and associated pathologic fracture/epidural tumor confirmed on MRI.  Neurosurgery consulted with recommendations for no need for decompressive surgery.  Radiation oncology consulted -Palliative care recommendations: Oxycodone -Decadron dose/taper per radiation oncology -Cotninue gabapentin and titrate as needed -Radiation therapy started on 1/3.  Patient will receive 10 session last day 1/17. Patient requested to increase pain regimen.  In last 24-hour  only used oxycodone twice in last 24-hour.  Recommended patient continue current regimen.  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 Possible metastasis with recommendation for PET-CT.  COPD (chronic obstructive pulmonary disease) ([place])- (present on admission) Appears to be stable for now.  No  exacerbation.  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 CT guided biopsy (1/3) performed of left paraspinal lesions at level of T12-L1 with inconclusive pathology. Medical oncology consulted and are on board. Repeat biopsy (1/5) with pathology pending. 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 Patient to continue radiation therapy.  Will follow up with oncology Dr. Julien Nordmann outpatient.  They will send tissue for further IHC and foundation 1 testing. Brain CT negative for any acute deformity.  Constipation- (present on admission) Continue aggressive bowel regimen.  Hyponatremia- (present on admission) Sodium level stable.  Likely from poor p.o. intake.  Alcohol use- (present on admission) Prior history. Cessation discussed. Currently beyond withdrawal time limit. Continue multivitamins.  Depression- (present on admission) Mood is stable but Continue current regimen.  Hyperlipidemia- (present on admission) Not on statin.     Subjective: No nausea no vomiting no fever no chills.  Continues to have shortness of breath.  Reports pain uncontrolled.  Objective Vitals:   05/05/21 1336 05/05/21 2137 05/06/21 0554 05/06/21 1310  BP: 124/80 108/72 (!) 115/91 108/81  Pulse: 96 (!) 101 99 95  Resp: 16 16 18 17   Temp: (!) 97.5 F (36.4 C) 97.7 F (36.5 C)  97.6 F (36.4 C)  TempSrc: Oral     SpO2: 98% 97% 97% 97%  Weight:      Height:        General: Appear in mild distress, no Rash; Oral Mucosa Clear, moist. no Abnormal Neck Mass Or lumps, Conjunctiva normal  Cardiovascular: S1 and S2 Present, no Murmur, Respiratory: good respiratory effort, Bilateral Air entry present and CTA, no Crackles, no wheezes Abdomen: Bowel Sound present, Soft and no tenderness Extremities: no Pedal edema Neurology: alert and oriented to time, place, and  person affect appropriate. no  new focal deficit Gait not checked due to patient safety concerns   Data Reviewed:  There are no new results to review at this time.  Family Communication: None at bedside  Disposition: Status is: Inpatient  Remains inpatient appropriate because: Awaiting safe discharge to SNF         Time spent: 35 minutes  Author: Berle Mull, MD 05/06/2021 8:24 PM  For on call review [email or web address].

## 2021-05-07 MED ORDER — SENNOSIDES-DOCUSATE SODIUM 8.6-50 MG PO TABS
1.0000 | ORAL_TABLET | Freq: Two times a day (BID) | ORAL | Status: DC
  Administered 2021-05-07 – 2021-05-13 (×8): 1 via ORAL
  Filled 2021-05-07 (×13): qty 1

## 2021-05-07 NOTE — Plan of Care (Signed)

## 2021-05-07 NOTE — Plan of Care (Signed)
  Problem: Education: Goal: Knowledge of General Education information will improve Description: Including pain rating scale, medication(s)/side effects and non-pharmacologic comfort measures Outcome: Progressing   Problem: Clinical Measurements: Goal: Ability to maintain clinical measurements within normal limits will improve Outcome: Progressing   Problem: Coping: Goal: Level of anxiety will decrease Outcome: Progressing   

## 2021-05-07 NOTE — TOC Progression Note (Signed)
Transition of Care [facility]) - Progression Note    Patient Details  Name: Christopher Dawson MRN: [id number] Date of Birth: 09-28-1955  Transition of Care [facility]) CM/SW Contact  Ross Ludwig, [place] Phone Number: 05/07/2021, 12:58 PM  Clinical Narrative:    Patient still does not have any bed offers, due to patient needing radiation through 1/17.  After radiation, TOC can continue working on bed search.   Expected Discharge Plan: [place] Barriers to Discharge: Continued Medical Work up  Expected Discharge Plan and Services Expected Discharge Plan: [place] In-house Referral: Clinical Social Work   Post Acute Care Choice: [place] Living arrangements for the past 2 months: Single Family Home                                       Social Determinants of Health (SDOH) Interventions    Readmission Risk Interventions No flowsheet data found.

## 2021-05-07 NOTE — Plan of Care (Signed)
  Problem: Education: Goal: Knowledge of General Education information will improve Description Including pain rating scale, medication(s)/side effects and non-pharmacologic comfort measures Outcome: Progressing   

## 2021-05-07 NOTE — Progress Notes (Signed)
Progress Note   Patient: Christopher Dawson QQV:[id number] DOB: [birth year] DOA: 04/23/2021     14 DOS: the patient was seen and examined on 05/07/2021   Brief hospital course: Christopher Dawson is a 66 y.o. male with a history of anxiety, depression, chronic lumbar radiculopathy, tobacco abuse, alcohol abuse. Patient presented secondary to intractable back pain and inability to ambulate. 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 Radiation therapy initiated. 12/31 admitted with intractable back pain.  T9/T12 lytic lesion, lung mass and adrenal mass.  Oncology and neurosurgery consulted. 1/3 CT-guided biopsy.  Procedure aborted after 1 sample due to bleeding. 1/5 repeat CT-guided biopsy.  In addition therapy started based on limited report of adenocarcinoma. 1/10 CT head with and without contrast for staging negative for metastatic disease.   Assessment and Plan * Cancer related pain- (present on admission) Secondary to thoracic and lumbar spine lesions concerning for metastasis with associated T12 destructive lesion and associated pathologic fracture/epidural tumor confirmed on MRI.  Neurosurgery consulted with recommendations for no need for decompressive surgery.  Radiation oncology consulted -Palliative care recommendations: Oxycodone -Decadron dose/taper per radiation oncology -Cotninue gabapentin and titrate as needed -Radiation therapy started on 1/3.  Patient will receive 10 session last day 1/17.  Right adrenal mass ([place])- (present on admission) Indeterminate. Possible metastasis with recommendation for PET-CT.  COPD (chronic obstructive pulmonary disease) (Christopher Dawson)- (present on admission) Appears to be stable for now.  No exacerbation.  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  Medical oncology consulted and are on board.  Repeat biopsy (1/5) with pathology pending.  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 Patient to continue radiation therapy.  Will follow up with oncology Dr. Julien Nordmann outpatient.  They will send tissue for further IHC and foundation 1 testing. Brain CT negative for any acute deformity.  Constipation- (present on admission) Continue aggressive bowel regimen.  Hyponatremia- (present on admission) Sodium level stable.  Likely from poor p.o. intake.  Alcohol use- (present on admission) Prior history. Cessation discussed. Currently beyond withdrawal time limit. Continue multivitamins.  Depression- (present on admission) Mood is stable but Continue current regimen.  Hyperlipidemia- (present on admission) Not on statin.     Subjective: Tells me the pain is better.  No nausea no vomiting.  Oral intake improving.   Objective Vitals:   05/06/21 0554 05/06/21 1310 05/07/21 0642 05/07/21 1415  BP: (!) 115/91 108/81 101/77 (!) 141/84  Pulse: 99 95 (!) 105 (!) 105  Resp: 18 17 16 20   Temp:  97.6 F (36.4 C) (!) 97.5 F (36.4 C) 97.9 F (36.6 C)  TempSrc:   Oral Oral  SpO2: 97% 97% 99% 96%  Weight:      Height:       General: Appear in mild distress, no Rash; Oral Mucosa Clear, moist. no Abnormal Neck Mass Or lumps, Conjunctiva normal  Cardiovascular: S1 and S2 Present, no Murmur, Respiratory: good respiratory effort, Bilateral Air entry present and CTA, no Crackles, no wheezes Abdomen: Bowel Sound present, Soft and no tenderness Extremities: no Pedal edema Neurology: alert and oriented to time, place, and person affect appropriate. no new focal deficit Gait not checked due to patient safety concerns    Data Reviewed:  There are no new results to review at this time.  Family Communication:  None at bedside.  Disposition: Status is:  Inpatient  Remains inpatient appropriate because: Need inpatient radiation therapy prior to transfer to SNF.  Medically stable.         Time spent: 35 minutes  Author: Berle Mull, MD 05/07/2021 2:30 PM  For on call review [email or web address].

## 2021-05-08 MED ORDER — LACTULOSE 10 GM/15ML PO SOLN
20.0000 g | Freq: Two times a day (BID) | ORAL | Status: DC
  Administered 2021-05-08 – 2021-05-11 (×4): 20 g via ORAL
  Filled 2021-05-08 (×6): qty 30

## 2021-05-08 MED ORDER — TAMSULOSIN HCL 0.4 MG PO CAPS
0.4000 mg | ORAL_CAPSULE | Freq: Every day | ORAL | Status: DC
  Administered 2021-05-08 – 2021-05-12 (×3): 0.4 mg via ORAL
  Filled 2021-05-08 (×5): qty 1

## 2021-05-08 NOTE — Progress Notes (Signed)
OT Cancellation Note  Patient Details Name: Christopher Dawson MRN: [id number] DOB: 11-04-1955   Cancelled Treatment:    Reason Eval/Treat Not Completed: Other (comment). Patient declined due to constipation causing excessive pain and attempting to have BM with laxatives. Will f/u as able.  Kolston Lacount L Sarahy Creedon 05/08/2021, 1:09 PM

## 2021-05-08 NOTE — Progress Notes (Signed)
[facility] PROGRESS NOTE  Patient: Christopher Dawson [id number]:[id number]   PCP: [name], MD DOB: 25-Nov-1955   DOA: 04/23/2021   DOS: 05/08/2021    Subjective: Continues to report constipation.  No nausea no vomiting.  No fever no chills.  Objective:  Vitals:   05/07/21 2114 05/08/21 0527  BP: 127/86 (!) 142/79  Pulse:  100  Resp: 18 16  Temp: 97.9 F (36.6 C) 98.2 F (36.8 C)  SpO2: 96% 97%    Bowel sound present. No tenderness. S1-S2 present.  Assessment and plan: Constipation. Will add lactulose.  Continue Senokot.  Patient refused MiraLAX.  Author: Berle Mull, MD [facility] 05/08/2021 11:17 AM   If 7PM-7AM, please contact night-coverage at [email or web address]

## 2021-05-08 NOTE — Plan of Care (Signed)
°  Problem: Education: Goal: Knowledge of General Education information will improve Description: Including pain rating scale, medication(s)/side effects and non-pharmacologic comfort measures Outcome: Progressing   Problem: Clinical Measurements: Goal: Ability to maintain clinical measurements within normal limits will improve Outcome: Progressing   Problem: Activity: Goal: Risk for activity intolerance will decrease Outcome: Progressing   Problem: Coping: Goal: Level of anxiety will decrease Outcome: Progressing   Problem: Elimination: Goal: Will not experience complications related to bowel motility Outcome: Progressing Goal: Will not experience complications related to urinary retention Outcome: Progressing   Problem: Pain Managment: Goal: General experience of comfort will improve Outcome: Progressing

## 2021-05-09 ENCOUNTER — Ambulatory Visit
Admit: 2021-05-09 | Discharge: 2021-05-09 | Disposition: A | Discharge status: Home / Self Care | Payer: Medicare Other | Attending: Radiation Oncology | Admitting: Radiation Oncology

## 2021-05-09 ENCOUNTER — Inpatient Hospital Stay (HOSPITAL_COMMUNITY): Payer: Medicare Other

## 2021-05-09 IMAGING — DX DG ABD PORTABLE 1V
1 series · 1 of 1 positions shown · non-contrast
Comparison: None.

CLINICAL DATA: Constipation

EXAM:
PORTABLE ABDOMEN - 1 VIEW

[abdomen kub]
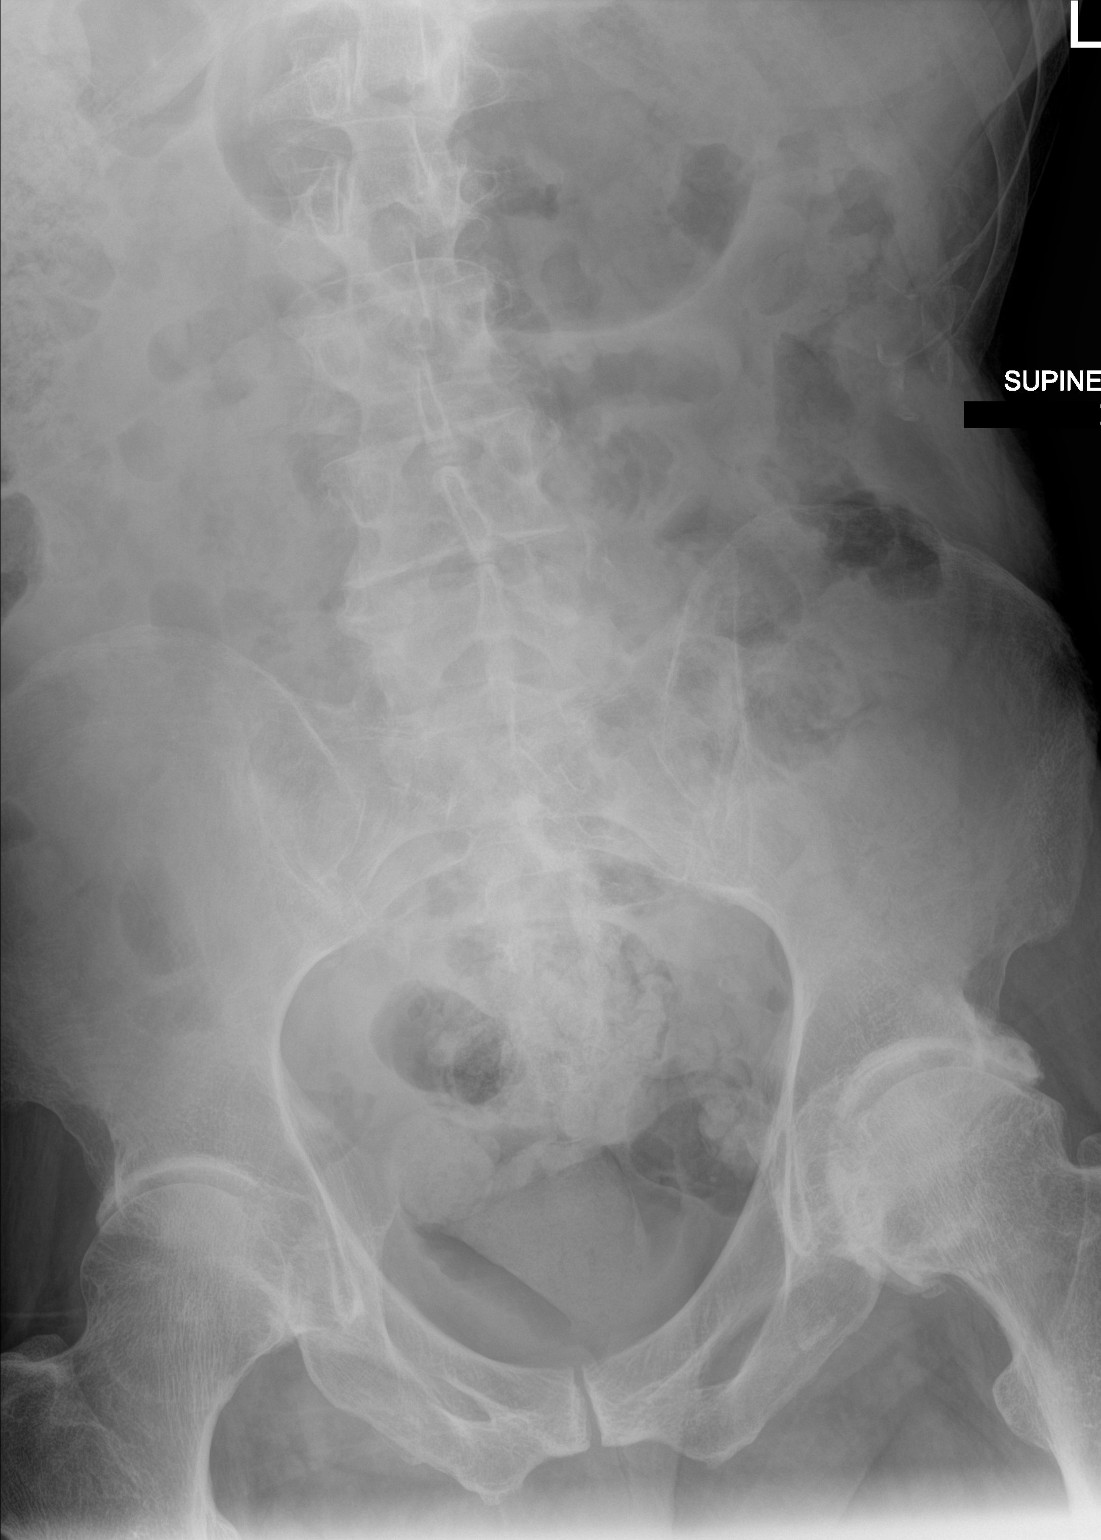

[1 of 1 positions shown; findings below may reference images not displayed]

FINDINGS: Supine frontal view of the abdomen and pelvis excludes the right
flank and bilateral hemidiaphragms by collimation. No bowel
obstruction or ileus. Mild retained stool within the colon. No
masses or abnormal calcifications. Bilateral hip osteoarthritis,
left greater than right.
IMPRESSION: 1. Mild fecal retention.  No bowel obstruction.

## 2021-05-09 MED ORDER — SORBITOL 70 % SOLN
60.0000 mL | Freq: Once | Status: AC
  Administered 2021-05-09: 60 mL via ORAL
  Filled 2021-05-09: qty 60

## 2021-05-09 NOTE — Progress Notes (Signed)
PT Cancellation Note  Patient Details Name: Christopher Dawson MRN: [id number] DOB: May 21, 1955   Cancelled Treatment:     Pt declined with no other reason than "now is not a good time".  Pt was OOB in recliner earlier with Nursing today.  Pt has been evaluated with rec for SNF.  Last Radiation Day is January 17th.   Rica Koyanagi  PTA Acute  Rehabilitation Services Pager      [phone] Office      [phone]

## 2021-05-09 NOTE — Plan of Care (Signed)
Problem: Elimination: Goal: Will not experience complications related to bowel motility Outcome: Not Progressing   Problem: Education: Goal: Knowledge of General Education information will improve Description: Including pain rating scale, medication(s)/side effects and non-pharmacologic comfort measures Outcome: Progressing   Problem: Health Behavior/Discharge Planning: Goal: Ability to manage health-related needs will improve Outcome: [place], RN 05/09/21 8:28 AM

## 2021-05-09 NOTE — Progress Notes (Signed)
OT Cancellation Note  Patient Details Name: Jaquavius Hudler MRN: [id number] DOB: 1955-09-13   Cancelled Treatment:    Reason Eval/Treat Not Completed: Other (comment) Patient was approached x2 on this date one in AM and one in PM with patient indicating various reasons why he cannot participate on this date. Pt agreed to participate in therapy on 1/17. OT to continue to follow and check back as schedule will allow.  Jackelyn Poling OTR/L, [place] Acute Rehabilitation Department Office# [phone] Pager# [phone]  05/09/2021, 12:51 PM

## 2021-05-09 NOTE — Progress Notes (Signed)
Nutrition Follow-up  DOCUMENTATION CODES:   Underweight  INTERVENTION:  - continue Ensure Enlive TID.   NUTRITION DIAGNOSIS:   Increased nutrient needs related to acute illness as evidenced by estimated needs. -ongoing  GOAL:   Patient will meet greater than or equal to 90% of their needs -met  MONITOR:   PO intake, Supplement acceptance, Labs, Weight trends  ASSESSMENT:   66 y.o. male with medical history of anxiety, depression, chronic lumbar radiculopathy, tobacco abuse, alcohol abuse, and ongoing physical deconditioning for the last 6 months using cane/walker at home. He presented to the ED due to intractable back pain and inability to ambulate which has led to inability to cook meals or grocery shop. He reported constipation and issues with urination. In the ED, CT showed T12 and T9 fractures with concern for metastatic malignant lesions. CT chest showed LUL nodule with pleural stranding which is being considered a primary neoplasm.  He has been eating nearly 100% at all meal since dinner on 1/8. He has been accepting Ensure 90-95% of the time offered and enjoys this supplement.   Weight yesterday was consistent with admission (12/31) weight. Weight had trended up from 12/31-1/9 and then has been trending back down since 1/10.  No information documented in the edema section of flow sheet this hospitalization.   Per MD note on 1/14, patient to remain inpatient while receiving XRT and then will d/c to SNF. Patient noted to be medically stable.     Labs reviewed; Na: 134 mmol/l, BUN: 31 mg/dl, creatinine: 0.54 mg/dl.  Medications reviewed; 1 mg folvite/day, 20 g lactulose BID, 1 tablet multivitamin with minerals/day, 40 mg oral protonix/day, 1 tablet senokot BID, 60 ml oral sorbitol x1 dose 1/16, 100 mg oral thiamine/day.    Diet Order:   Diet Order             Diet regular Room service appropriate? Yes; Fluid consistency: Thin  Diet effective now                    EDUCATION NEEDS:   No education needs have been identified at this time  Skin:  Skin Assessment: Reviewed RN Assessment  Last BM:  1/15 (type 5, medium amount)  Height:   Ht Readings from Last 1 Encounters:  05/02/21 6' 4"  (1.93 m)    Weight:   Wt Readings from Last 1 Encounters:  05/08/21 67.8 kg     Estimated Nutritional Needs:  Kcal:  2350-2550 kcal Protein:  125-140 grams Fluid:  >/= 2.5 L/day     Jarome Matin, MS, RD, LDN Inpatient Clinical Dietitian RD pager # available in [place]  After hours/weekend pager # available in [facility]

## 2021-05-09 NOTE — Progress Notes (Signed)
[facility] PROGRESS NOTE  Patient: Christopher Dawson [id number]:[id number]   PCP: [name], MD DOB: 11-15-1955   DOA: 04/23/2021   DOS: 05/09/2021    Subjective: Reports constipation.  No nausea no vomiting no fever no chills.  No abdominal pain.  Also reports ongoing pain which is well controlled with medication.  Objective:  Vitals:   05/09/21 0649 05/09/21 1506  BP: 128/90 124/82  Pulse: 98 100  Resp: 18 18  Temp: 97.7 F (36.5 C) 98 F (36.7 C)  SpO2: 98% 96%  Soiled in urine. Bowel sound present. S1-S2 present lungs and no tenderness.  Assessment and plan: Constipation. Add sorbitol. Refusing medications. X-ray abdomen also ordered. Appears to have ascending colon constipation therefore no indication for enema for now.  Continue radiation.  Author: Berle Mull, MD [facility] 05/09/2021 6:57 PM   If 7PM-7AM, please contact night-coverage at [email or web address]

## 2021-05-10 ENCOUNTER — Inpatient Hospital Stay: Payer: Medicare Other | Admitting: Internal Medicine

## 2021-05-10 ENCOUNTER — Ambulatory Visit: Payer: Medicare Other

## 2021-05-10 ENCOUNTER — Inpatient Hospital Stay: Payer: Medicare Other | Admitting: Nurse Practitioner

## 2021-05-10 ENCOUNTER — Encounter: Payer: Self-pay | Admitting: Urology

## 2021-05-10 ENCOUNTER — Inpatient Hospital Stay: Payer: Medicare Other

## 2021-05-10 ENCOUNTER — Encounter: Payer: Self-pay | Admitting: *Deleted

## 2021-05-10 ENCOUNTER — Ambulatory Visit
Admit: 2021-05-10 | Discharge: 2021-05-10 | Disposition: A | Discharge status: Home / Self Care | Payer: Medicare Other | Attending: Radiation Oncology | Admitting: Radiation Oncology

## 2021-05-10 DIAGNOSIS — C7951 Secondary malignant neoplasm of bone: Secondary | ICD-10-CM

## 2021-05-10 NOTE — Plan of Care (Signed)
Plan of care reviewed and discussed. °

## 2021-05-10 NOTE — Progress Notes (Signed)
°  Progress Note   Patient: Christopher Dawson GEZ:[id number] DOB: November 06, 1955 DOA: 04/23/2021     17 DOS: the patient was seen and examined on 05/10/2021   Brief hospital course: Christopher Dawson is a 66 y.o. male with a history of anxiety, depression, chronic lumbar radiculopathy, tobacco abuse, alcohol abuse. Patient presented secondary to intractable back pain and inability to ambulate. 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 Radiation therapy initiated. 12/31 admitted with intractable back pain.  T9/T12 lytic lesion, lung mass and adrenal mass.  Oncology and neurosurgery consulted. 1/3 CT-guided biopsy.  Procedure aborted after 1 sample due to bleeding. 1/5 repeat CT-guided biopsy.  In addition therapy started based on limited report of adenocarcinoma. 1/10 CT head with and without contrast for staging negative for metastatic disease. 1/17, Radiation completed.  medically stable.  Awaiting placement.  Assessment and Plan * Cancer related pain- (present on admission) Secondary to thoracic and lumbar spine lesions concerning for metastasis with associated T12 destructive lesion and associated pathologic fracture/epidural tumor confirmed on MRI.  Neurosurgery consulted with recommendations for no need for decompressive surgery.  Radiation oncology consulted -Palliative care recommendations: Oxycodone -Decadron dose/taper per radiation oncology -Cotninue gabapentin and titrate as needed -Radiation therapy started on 1/3.  Patient received 10 session last day 1/17. Outpatient follow-up with medical oncology.  Right adrenal mass ([place])- (present on admission) Indeterminate. Possible metastasis with recommendation for PET-CT.  COPD (chronic obstructive pulmonary disease) ([place])- (present on admission) Appears to be stable for now.  No exacerbation.  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  Medical oncology consulted and are on board.  Repeat biopsy (1/5) with pathology pending.  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 Patient to continue radiation therapy.  Will follow up with oncology Dr. Julien Nordmann outpatient.  They will send tissue for further IHC and foundation 1 testing. Brain CT negative for any acute deformity.  Constipation- (present on admission) Continue aggressive bowel regimen.  Hyponatremia- (present on admission) Sodium level stable.  Likely from poor p.o. intake.  Alcohol use- (present on admission) Prior history. Cessation discussed. Currently beyond withdrawal time limit. Continue multivitamins.  Depression- (present on admission) Mood is stable but Continue current regimen.  Hyperlipidemia- (present on admission) Not on statin.     Subjective: No nausea no vomiting.  No fever no chills.  Ongoing complaints of constipation although he actually has a bowel movement sitting in the bedpan next to him right now.  Objective Alert awake and oriented x3.  Data Reviewed:  There are no new results to review at this time.  Family Communication: None at bedside  Disposition: Status is: Inpatient  Remains inpatient appropriate because: Unsafe discharge.  Will be going to SNF once bed is available.  Medically stable.     Time spent: 35 minutes  Author: Berle Mull, MD 05/10/2021 7:02 PM  For on call review [email or web address].

## 2021-05-10 NOTE — Progress Notes (Signed)
Oncology Nurse Navigator Documentation  Oncology Nurse Navigator Flowsheets 05/10/2021 05/06/2021 05/05/2021  Abnormal Finding Date - 04/23/2021 -  Confirmed Diagnosis Date - 04/28/2021 -  Diagnosis Status - Pending Molecular Studies -  Planned Course of Treatment - Radiation -  Phase of Treatment - Radiation -  Radiation Actual Start Date: - 04/26/2021 -  Navigator Follow Up Date: - 05/10/2021 05/09/2021  Navigator Follow Up Reason: - New Patient Appointment Appointment Review;Review Note  Navigator Location [facility]  Referral Date to RadOnc/MedOnc - - 05/05/2021  Navigator Encounter Type Other: Pathology Review Other:  Treatment Initiated Date - 04/26/2021 -  Patient Visit Type Inpatient Other -  Treatment Phase - Other Other  Barriers/Navigation Needs Coordination of Care/I updated Dr. Julien Nordmann that patient is in the hospital at this time.  He asked that his appt be re-scheduled.  Will re-schedule at a later time.  Coordination of Care Coordination of Care  Interventions Coordination of Care Coordination of Care Coordination of Care  Acuity Level 2-Minimal Needs (1-2 Barriers Identified) Level 2-Minimal Needs (1-2 Barriers Identified) Level 2-Minimal Needs (1-2 Barriers Identified)  Coordination of Care Appts Pathology Other  Time Spent with Patient 30 30 30

## 2021-05-11 DIAGNOSIS — F320 Major depressive disorder, single episode, mild: Secondary | ICD-10-CM

## 2021-05-11 DIAGNOSIS — K5900 Constipation, unspecified: Secondary | ICD-10-CM

## 2021-05-11 DIAGNOSIS — E7800 Pure hypercholesterolemia, unspecified: Secondary | ICD-10-CM

## 2021-05-11 DIAGNOSIS — C7951 Secondary malignant neoplasm of bone: Principal | ICD-10-CM

## 2021-05-11 DIAGNOSIS — E871 Hypo-osmolality and hyponatremia: Secondary | ICD-10-CM

## 2021-05-11 DIAGNOSIS — Z789 Other specified health status: Secondary | ICD-10-CM

## 2021-05-11 DIAGNOSIS — E278 Other specified disorders of adrenal gland: Secondary | ICD-10-CM

## 2021-05-11 DIAGNOSIS — J431 Panlobular emphysema: Secondary | ICD-10-CM

## 2021-05-11 DIAGNOSIS — C3490 Malignant neoplasm of unspecified part of unspecified bronchus or lung: Secondary | ICD-10-CM

## 2021-05-11 MED ORDER — LACTULOSE 10 GM/15ML PO SOLN
30.0000 g | Freq: Two times a day (BID) | ORAL | Status: DC
  Administered 2021-05-12 (×2): 30 g via ORAL
  Filled 2021-05-11 (×3): qty 45

## 2021-05-11 MED ORDER — BISACODYL 5 MG PO TBEC
5.0000 mg | DELAYED_RELEASE_TABLET | Freq: Every day | ORAL | Status: DC
  Administered 2021-05-11 – 2021-05-13 (×3): 5 mg via ORAL
  Filled 2021-05-11 (×3): qty 1

## 2021-05-11 NOTE — Progress Notes (Signed)
PROGRESS NOTE    Christopher Dawson  FBP:[id number] DOB: 10/31/1955 DOA: 04/23/2021 PCP: [name], MD     Brief Narrative:  66 y.o. WM PMHx anxiety, depression, chronic lumbar radiculopathy, tobacco abuse, alcohol abuse.   Presented secondary to intractable back pain and inability to ambulate. 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 Radiation therapy initiated. 12/31 admitted with intractable back pain.  T9/T12 lytic lesion, lung mass and adrenal mass.  Oncology and neurosurgery consulted. 1/3 CT-guided biopsy.  Procedure aborted after 1 sample due to bleeding. 1/5 repeat CT-guided biopsy.  In addition therapy started based on limited report of adenocarcinoma. 1/10 CT head with and without contrast for staging negative for metastatic disease. 1/17, Radiation completed.  medically stable.  Awaiting placement.   Subjective: Back pain fairly well controlled.  States request pain medication 2-3 times per day.  Current concern is significant constipation.   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  Neurosurgery consulted with recommendations for no need for decompressive surgery.  Radiation oncology consulted -Palliative care recommendations: Oxycodone -Decadron 4 mg  TID, will decrease to BID on 1/19  -Gabapentin 100 mg TID.  Titrate as  needed -Radiation therapy started on 1/3.  Patient received 10 session last day 1/17. Outpatient follow-up with medical oncology.   Right adrenal mass ([place])- (present on admission) -Indeterminate. Possible metastasis with recommendation for PET-CT.   COPD (chronic obstructive pulmonary disease) ([place])- (present on admission) -Appears to be stable for now.  No exacerbation.   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  Medical oncology consulted and are on board.  -Repeat biopsy (1/5)   A. SOFT TISSUE MASS, NEEDLE CORE BIOPSY:  - Poorly differentiated non-small cell carcinoma.  - See comment.   COMMENT:  Immunohistochemistry shows the carcinoma is positive with cytokeratin 7,  MOC31 and cytokeratin 5/6 and shows patchy positivity with TTF-1 and p63  and there are are a few positive cells with p40.  The carcinoma is  negative with Napsin A, cytokeratin 20, CDX2 and S100.  The TTF-1  positivity is consistent with primary lung non-small cell carcinoma and  the differential includes adenosquamous carcinoma.   Dr. Vic Ripper agrees.    GROSS DESCRIPTION:  Received in formalin are 6 cores of soft white tissue measuring 0.5 to  0.7 cm in length and each less than 0.1 cm in diameter.  The specimen is  entirely submitted in 2 cassettes.  [facility] 04/29/2021)    Final Diagnosis performed by Claudette Laws, MD.   Electronically signed  05/03/2021  Technical and / or Professional components performed at [facility], [place] [address] [place], [place] [address].   Immunohistochemistry Technical component (if applicable) was performed  at [facility]. [address] [place],  [place], [place] [address].   IMMUNOHISTOCHEMISTRY  DISCLAIMER (if applicable):  Some of these immunohistochemical stains may have been developed and  the  performance characteristics determine by [facility]. Some  may not have been cleared or approved by the U.S. Food and Drug  Administration. The FDA has determined that such clearance or approval  is not necessary. This test is used for clinical purposes. It should not  be regarded as investigational or for research. This laboratory is  certified under the [place]  (CLIA-88) as qualified to perform high complexity clinical laboratory  testing.  The controls stained appropriately.  - Patient to continue radiation therapy.  -Will follow up with oncology Dr. Julien Nordmann outpatient.  They will send tissue for further IHC and foundation 1 testing. -Brain CT negative for any acute deformity.   Constipation- (present on admission) -1/18 bisacodyl 5 mg daily -1/18 increase lactulose 30 g BID -.Senokot 1 tablet BID   Hyponatremia- (present on admission) -Mildly low asymptomatic .   Alcohol use- (present on admission) -Prior history. -Cessation discussed. -Currently beyond withdrawal time limit. -Continue multivitamins.   Depression- (present on admission) -Mood is stable    Hyperlipidemia- (present on admission) -Not on statin.       DVT prophylaxis: Heparin subcu Code Status: Partial Family Communication:  Status is: Inpatient    Dispo: The patient is from: Home              Anticipated d/c is to: SNF              Anticipated d/c date is: 3 days              Patient currently is not medically stable to d/c.      Consultants:  Oncology Radiation oncology Palliative care  Procedures/Significant Events:    I have personally reviewed and interpreted all radiology studies and my findings are as above.  VENTILATOR SETTINGS:    Cultures   Antimicrobials:    Devices    LINES / TUBES:      Continuous Infusions:   Objective: Vitals:   05/10/21 0613 05/10/21 1320 05/10/21 2045 05/11/21 0438   BP: 117/87 121/88 122/68 128/76  Pulse: (!) 103 98 (!) 108 (!) 101  Resp: 16 20 17 17   Temp: 97.9 F (36.6 C) 97.8 F (36.6 C) 98.3 F (36.8 C) (!) 97.5 F (36.4 C)  TempSrc: Oral Oral  Oral  SpO2: 97% 98% 96% 96%  Weight:      Height:        Intake/Output Summary (Last 24 hours) at 05/11/2021 7425 Last data filed at 05/11/2021 0700 Gross per 24 hour  Intake 240 ml  Output 1775 ml  Net -1535 ml   Filed Weights   05/02/21 1151 05/03/21 0500 05/08/21 0612  Weight: 69.9 kg 68.7 kg 67.8 kg    Examination:  General: A/O x4, No acute respiratory distress cachectic Eyes: negative scleral hemorrhage, negative anisocoria, negative icterus ENT: Negative Runny nose, negative gingival bleeding, Neck:  Negative scars, masses, torticollis, lymphadenopathy, JVD Lungs: Clear to auscultation bilaterally without wheezes or crackles Cardiovascular: Regular rate and rhythm without murmur gallop or rub normal S1 and S2 Abdomen: negative abdominal pain, nondistended, positive soft, bowel sounds, no rebound, no ascites, no appreciable mass Extremities: No significant cyanosis, clubbing, or edema bilateral lower extremities Skin: Negative rashes, lesions, ulcers Psychiatric:  Negative depression, negative anxiety, negative fatigue, negative mania  Central nervous system:  Cranial nerves II through XII intact, tongue/uvula midline, all extremities muscle strength  5/5, sensation intact throughout, negative dysarthria, negative expressive aphasia, negative receptive aphasia.  .     Data Reviewed: Care during the described time interval was provided by me .  I have reviewed this patient's available data, including medical history, events of note, physical examination, and all test results as part of my evaluation.  CBC: Recent Labs  Lab 05/05/21 0315  WBC 15.7*  HGB 14.4  HCT 42.7  MCV 89.7  PLT 546   Basic Metabolic Panel: Recent Labs  Lab 05/05/21 0315  NA 134*  K 4.4  CL 100   CO2 25  GLUCOSE 115*  BUN 31*  CREATININE 0.54*  CALCIUM 8.9   GFR: Estimated Creatinine Clearance: 87.1 mL/min (A) (by C-G formula based on SCr of 0.54 mg/dL (L)). Liver Function Tests: No results for input(s): AST, ALT, ALKPHOS, BILITOT, PROT, ALBUMIN in the last 168 hours. No results for input(s): LIPASE, AMYLASE in the last 168 hours. No results for input(s): AMMONIA in the last 168 hours. Coagulation Profile: No results for input(s): INR, PROTIME in the last 168 hours. Cardiac Enzymes: No results for input(s): CKTOTAL, CKMB, CKMBINDEX, TROPONINI in the last 168 hours. BNP (last 3 results) No results for input(s): PROBNP in the last 8760 hours. HbA1C: No results for input(s): HGBA1C in the last 72 hours. CBG: No results for input(s): GLUCAP in the last 168 hours. Lipid Profile: No results for input(s): CHOL, HDL, LDLCALC, TRIG, CHOLHDL, LDLDIRECT in the last 72 hours. Thyroid Function Tests: No results for input(s): TSH, T4TOTAL, FREET4, T3FREE, THYROIDAB in the last 72 hours. Anemia Panel: No results for input(s): VITAMINB12, FOLATE, FERRITIN, TIBC, IRON, RETICCTPCT in the last 72 hours. Sepsis Labs: No results for input(s): PROCALCITON, LATICACIDVEN in the last 168 hours.  No results found for this or any previous visit (from the past 240 hour(s)).       Radiology Studies: DG Abd Portable 1V  Result Date: 05/09/2021 CLINICAL DATA:  Constipation EXAM: PORTABLE ABDOMEN - 1 VIEW COMPARISON:  None. FINDINGS: Supine frontal view of the abdomen and pelvis excludes the right flank and bilateral hemidiaphragms by collimation. No bowel obstruction or ileus. Mild retained stool within the colon. No masses or abnormal calcifications. Bilateral hip osteoarthritis, left greater than right. IMPRESSION: 1. Mild fecal retention.  No bowel obstruction. Electronically Signed   By: Randa Ngo M.D.   On: 05/09/2021 16:19        Scheduled Meds:  clomiPRAMINE  100 mg Oral QHS    dexamethasone  4 mg Oral Q8H   feeding supplement  237 mL Oral TID BM   folic acid  1 mg Oral Daily   gabapentin  100 mg Oral TID   heparin injection (subcutaneous)  5,000 Units Subcutaneous Q8H   lactulose  20 g Oral BID   multivitamin with minerals  1 tablet Oral Daily   nicotine  14 mg Transdermal Q24H   pantoprazole  40 mg Oral Daily   senna-docusate  1 tablet Oral BID   tamsulosin  0.4 mg Oral QPC supper   thiamine  100 mg Oral Daily   Continuous Infusions:   LOS: 18 days    Time spent:40 min    Infiniti Hoefling, Geraldo Docker, MD [facility]   If 7PM-7AM, please contact night-coverage 05/11/2021, 9:23 AM

## 2021-05-11 NOTE — TOC Progression Note (Signed)
Transition of Care [facility]) - Progression Note    Patient Details  Name: Eddy Liszewski MRN: [id number] Date of Birth: [birth year]  [facility] CM/SW Contact  Lennart Pall, LCSW Phone Number: 05/11/2021, 12:09 PM  Clinical Narrative:    Met yesterday with pt to confirm he is still in agreement with SNF plan - he is.  He has completed radiation txs and has worked with therapies today who continue to make SNF recommendation.  FL2 updated and sent out to all area SNFs.   Expected Discharge Plan: [place] Barriers to Discharge: Continued Medical Work up  Expected Discharge Plan and Services Expected Discharge Plan: [place] In-house Referral: Clinical Social Work   Post Acute Care Choice: [place] Living arrangements for the past 2 months: Single Family Home                                       Social Determinants of Health (SDOH) Interventions    Readmission Risk Interventions No flowsheet data found.

## 2021-05-11 NOTE — NC FL2 (Signed)
[place] LEVEL OF CARE SCREENING TOOL     IDENTIFICATION  Patient Name: Christopher Dawson Birthdate: Jun 18, 1955 Sex: male Admission Date (Current Location): 04/23/2021  [facility] and [place] Number:  Herbalist and Address:  [facility],  [place], [place]      Provider Number: [id number]  Attending Physician Name and Address:  Allie Bossier, MD  Relative Name and Phone Number:       Current Level of Care: Hospital Recommended Level of Care: [place] Prior Approval Number:    Date Approved/Denied:   PASRR Number: [id number]  Discharge Plan: SNF    Current Diagnoses: Patient Active Problem List   Diagnosis Date Noted   Hyponatremia 05/05/2021   Cancer related pain 05/05/2021   Depression 05/05/2021   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 ([place]) 05/05/2021   COPD (chronic obstructive pulmonary disease) ([place]) 05/05/2021   Constipation 05/05/2021   Right adrenal mass ([place]) 05/05/2021   Alcohol use 05/05/2021   Spine metastasis ([place]) 04/26/2021   Intractable back pain 04/23/2021   Elevated BP without diagnosis of hypertension 11/25/2020   Poor social situation 11/25/2020   Alcohol withdrawal syndrome without complication (HCC)    H/O alcohol abuse 05/09/2017   Hypocalcemia 05/09/2017   Leukopenia 05/09/2017   Thrombocytopenia ([place]) 05/09/2014   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   Abnormality of gait 04/12/2013   Tobacco use disorder 07/04/2012   GAD (generalized anxiety disorder) 07/04/2012   Major depressive disorder, recurrent episode ([place]) 07/04/2012   Hyperlipidemia 06/24/2012    Orientation RESPIRATION BLADDER Height & Weight     Self, Time, Situation, Place  Normal Continent Weight: 149 lb 7.6 oz (67.8 kg) Height:  6\' 4"  (193 cm) (reported by patient)  BEHAVIORAL SYMPTOMS/MOOD NEUROLOGICAL BOWEL NUTRITION STATUS      Continent    AMBULATORY STATUS COMMUNICATION OF NEEDS Skin    Extensive Assist Verbally Normal                       Personal Care Assistance Level of Assistance  Bathing, Dressing Bathing Assistance: Limited assistance   Dressing Assistance: Limited assistance     Functional Limitations Info             SPECIAL CARE FACTORS FREQUENCY  OT (By licensed OT), PT (By licensed PT)     PT Frequency: 5x/wk OT Frequency: 5x/wk            Contractures Contractures Info: Not present    Additional Factors Info  Code Status, Allergies, Psychotropic Code Status Info: Partial Allergies Info: codeine Psychotropic Info: see MAR         Current Medications (05/11/2021):  This is the current hospital active medication list Current Facility-Administered Medications  Medication Dose Route Frequency Provider Last Rate Last Admin   acetaminophen (TYLENOL) tablet 650 mg  650 mg Oral Q6H PRN Barton Dubois, MD   650 mg at 04/24/21 0049   Or   acetaminophen (TYLENOL) suppository 650 mg  650 mg Rectal Q6H PRN Barton Dubois, MD       clomiPRAMINE (ANAFRANIL) capsule 100 mg  100 mg Oral QHS Barton Dubois, MD   100 mg at 05/10/21 2257   dexamethasone (DECADRON) tablet 4 mg  4 mg Oral Q8H Mariel Aloe, MD   4 mg at 05/11/21 8101   feeding supplement (ENSURE ENLIVE / ENSURE PLUS) liquid 237 mL  237 mL Oral TID BM Antonieta Pert, MD  237 mL at 94/50/38 8828   folic acid (FOLVITE) tablet 1 mg  1 mg Oral Daily Barton Dubois, MD   1 mg at 05/11/21 1101   gabapentin (NEURONTIN) capsule 100 mg  100 mg Oral TID Mariel Aloe, MD   100 mg at 05/11/21 1101   heparin injection 5,000 Units  5,000 Units Subcutaneous Q8H Kc, Maren [place], MD   5,000 Units at 05/11/21 0034   hydrOXYzine (ATARAX) tablet 25 mg  25 mg Oral TID PRN Mariel Aloe, MD   25 mg at 05/02/21 1020   lactulose (CHRONULAC) 10 GM/15ML solution 20 g  20 g Oral BID Lavina Hamman, MD   20 g at 05/11/21 1100   multivitamin with minerals tablet 1 tablet  1 tablet Oral Daily Barton Dubois, MD   1  tablet at 05/11/21 1101   nicotine (NICODERM CQ - dosed in mg/24 hours) patch 14 mg  14 mg Transdermal Q24H Barton Dubois, MD   14 mg at 05/04/21 2117   ondansetron (ZOFRAN) tablet 4 mg  4 mg Oral Q6H PRN Barton Dubois, MD       Or   ondansetron [facility]) injection 4 mg  4 mg Intravenous Q6H PRN Barton Dubois, MD       oxyCODONE (Oxy IR/ROXICODONE) immediate release tablet 5-10 mg  5-10 mg Oral Q4H PRN Maryanna Shape, NP   10 mg at 05/11/21 1106   pantoprazole (PROTONIX) EC tablet 40 mg  40 mg Oral Daily Barton Dubois, MD   40 mg at 05/11/21 1101   senna-docusate (Senokot-S) tablet 1 tablet  1 tablet Oral BID Lavina Hamman, MD   1 tablet at 05/11/21 1101   tamsulosin (FLOMAX) capsule 0.4 mg  0.4 mg Oral QPC supper Lavina Hamman, MD   0.4 mg at 05/09/21 1730   thiamine tablet 100 mg  100 mg Oral Daily Barton Dubois, MD   100 mg at 05/11/21 1101     Discharge Medications: Please see discharge summary for a list of discharge medications.  Relevant Imaging Results:  Relevant Lab Results:   Additional Information SS# [id number]  Lennart Pall, LCSW

## 2021-05-11 NOTE — Progress Notes (Signed)
Occupational Therapy Treatment Patient Details Name: Christopher Dawson MRN: [id number] DOB: [birth year] Today's Date: 05/11/2021   History of present illness 66 y.o. male with PMH of anxiety/depression, chronic lumbar radiculopathy, tobacco abuse, Marfans, alcohol abuse. Pt verbally reports h/o cerebral palsy with 2 childhood surgeries "so I wouldn't walk on my toes."  Pt presented with  ongoing physical deconditioning for the last 6 months using cane/walker at home presented with intractable back pain and inability to ambulate, or get up to cook his meals or by groceries. Complains of constipation no urinary issues.  In the ED -CT scan-T12 and T9 vertebra process/destruction with concern for metastatic malignant lesions. 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   OT comments  Patient was able to participate in LB dressing education EOB with patient noted to report increased pain. Patient was able to use reacher with increased time to don/doff pants with min A. Patient would continue to benefit from skilled OT services at this time while admitted and after d/c to address noted deficits in order to improve overall safety and independence in ADLs.     Recommendations for follow up therapy are one component of a multi-disciplinary discharge planning process, led by the attending physician.  Recommendations may be updated based on patient status, additional functional criteria and insurance authorization.    Follow Up Recommendations  Skilled nursing-short term rehab (<3 hours/day)    Assistance Recommended at Discharge Frequent or constant Supervision/Assistance  Patient can return home with the following  A little help with walking and/or transfers;A lot of help with bathing/dressing/bathroom;Assistance with cooking/housework   Equipment Recommendations  BSC/3in1    Recommendations for Other Services      Precautions / Restrictions Precautions Precautions: Fall Precaution Comments: reports 3 falls in  past 6 months, all occurred at curb to enter his condo, back precautions for comfort Restrictions Weight Bearing Restrictions: No       Mobility Bed Mobility Overal bed mobility: Needs Assistance     Sidelying to sit: Supervision       General bed mobility comments: cues for log roll technique, to lower legs and keep neutral spine. cues to return to s/l position and lift LEs on to bed    Transfers                         Balance Overall balance assessment: Needs assistance Sitting-balance support: No upper extremity supported Sitting balance-Leahy Scale: Fair Sitting balance - Comments: posterior leaning to reduce pain                                   ADL either performed or assessed with clinical judgement   ADL Overall ADL's : Needs assistance/impaired                     Lower Body Dressing: Sitting/lateral leans Lower Body Dressing Details (indicate cue type and reason): patient was educated on using reacher for LB dressing to reduce twisting of back with LB dressing tasks. patient elected to don/doff socks supine in bed with noted twisting. patient was educated on how this was not always an available posture to use if in public patient verbalized understanding. patient was min A to don pants with reacher sitting on edge of bed with increased encouragement to participate in task sitting on edge of bed. patient needed in A to get pants off with patient attempting to  push over top of foot with reacher.                    Extremity/Trunk Assessment              Vision       Perception     Praxis      Cognition Arousal/Alertness: Awake/alert Behavior During Therapy: WFL for tasks assessed/performed Overall Cognitive Status: Within Functional Limits for tasks assessed                                 General Comments: patient is easily distracted by internal and external factors.        Exercises       Shoulder Instructions       General Comments      Pertinent Vitals/ Pain       Pain Assessment Pain Assessment: Faces Faces Pain Scale: Hurts little more Pain Location: back-exacerbated by sitting Pain Descriptors / Indicators: Sore, Grimacing Pain Intervention(s): Monitored during session, Premedicated before session, Repositioned  Home Living                                          Prior Functioning/Environment              Frequency  Min 2X/week        Progress Toward Goals  OT Goals(current goals can now be found in the care plan section)  Progress towards OT goals: Progressing toward goals     Plan Discharge plan remains appropriate    Co-evaluation                 AM-PAC OT "6 Clicks" Daily Activity     Outcome Measure   Help from another person eating meals?: None Help from another person taking care of personal grooming?: A Little Help from another person toileting, which includes using toliet, bedpan, or urinal?: A Lot Help from another person bathing (including washing, rinsing, drying)?: A Lot Help from another person to put on and taking off regular upper body clothing?: A Little Help from another person to put on and taking off regular lower body clothing?: A Lot 6 Click Score: 16    End of Session Equipment Utilized During Treatment: Other (comment) (reacher)  OT Visit Diagnosis: Other abnormalities of gait and mobility (R26.89);Pain   Activity Tolerance Patient limited by pain   Patient Left in bed;with call bell/phone within reach   Nurse Communication Mobility status        Time: 3403-5248 OT Time Calculation (min): 35 min  Charges: OT General Charges $OT Visit: 1 Visit OT Treatments $Self Care/Home Management : 23-37 mins  Jackelyn Poling OTR/L, MS Acute Rehabilitation Department Office# [phone] Pager# [phone]   Marcellina Millin 05/11/2021, 12:20 PM

## 2021-05-11 NOTE — Progress Notes (Signed)
Physical Therapy Treatment Patient Details Name: Christopher Dawson MRN: [id number] DOB: 06/19/1955 Today's Date: 05/11/2021   History of Present Illness 66 y.o. male with PMH of anxiety/depression, chronic lumbar radiculopathy, tobacco abuse, Marfans, alcohol abuse. Pt verbally reports h/o cerebral palsy with 2 childhood surgeries "so I wouldn't walk on my toes."  Pt presented with  ongoing physical deconditioning for the last 6 months using cane/walker at home presented with intractable back pain and inability to ambulate, or get up to cook his meals or by groceries. Complains of constipation no urinary issues.  In the ED -CT scan-T12 and T9 vertebra process/destruction with concern for metastatic malignant lesions. 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    PT Comments    Pt is pleasant and cooperative with PT, reviewed proper spine positioning in supine to decr pain. Pt able to mobilize as noted below, sitting is limited d/t pain. Continue to recommend SNF post acute   Recommendations for follow up therapy are one component of a multi-disciplinary discharge planning process, led by the attending physician.  Recommendations may be updated based on patient status, additional functional criteria and insurance authorization.  Follow Up Recommendations  Skilled nursing-short term rehab (<3 hours/day)     Assistance Recommended at Discharge Frequent or constant Supervision/Assistance  Patient can return home with the following A lot of help with bathing/dressing/bathroom;A little help with walking and/or transfers;Assistance with cooking/housework;Assist for transportation;Help with stairs or ramp for entrance   Equipment Recommendations  Other (comment) (defer to SNF; w/c if home)    Recommendations for Other Services       Precautions / Restrictions Precautions Precautions: Fall Precaution Comments: reports 3 falls in past 6 months, all occurred at curb to enter his condo, back precautions for  comfort Restrictions Weight Bearing Restrictions: No     Mobility  Bed Mobility Overal bed mobility: Needs Assistance Bed Mobility: Rolling, Sidelying to Sit Rolling: Modified independent (Device/Increase time) Sidelying to sit: Supervision       General bed mobility comments: cues for log roll technique, to lower legs and keep neutral spine. cues to return to s/l position and lift LEs on to bed    Transfers Overall transfer level: Needs assistance Equipment used: Rolling walker (2 wheels) Transfers: Sit to/from Stand Sit to Stand: Min assist, From elevated surface           General transfer comment: cues for hand placement and LE position. cues to control descent    Ambulation/Gait Ambulation/Gait assistance: Min assist, Min guard Gait Distance (Feet): 4 Feet Assistive device: Rolling walker (2 wheels) Gait Pattern/deviations: Knee flexed in stance - right, Knee flexed in stance - left, Decreased dorsiflexion - right, Decreased dorsiflexion - left, Narrow base of support       General Gait Details: pt with increased PF of ankles and increased hip and knee flexion,  able to take lateral steps along EOB with min/guard. intermittent min assist to move RLE   Stairs             Wheelchair Mobility    Modified Rankin (Stroke Patients Only)       Balance   Sitting-balance support: No upper extremity supported Sitting balance-Leahy Scale: Fair     Standing balance support: Reliant on assistive device for balance Standing balance-Leahy Scale: Poor Standing balance comment: reliant upon BUE support                            Cognition Arousal/Alertness:  Awake/alert Behavior During Therapy: WFL for tasks assessed/performed Overall Cognitive Status: Within Functional Limits for tasks assessed                                          Exercises      General Comments        Pertinent Vitals/Pain Pain Assessment Pain  Assessment: Faces Faces Pain Scale: Hurts little more Pain Location: back-exacerbated by sitting Pain Descriptors / Indicators: Sore, Grimacing Pain Intervention(s): Limited activity within patient's tolerance, Monitored during session, Premedicated before session, Repositioned    Home Living                          Prior Function            PT Goals (current goals can now be found in the care plan section) Acute Rehab PT Goals Patient Stated Goal: reduce back pain PT Goal Formulation: With patient Time For Goal Achievement: 05/18/21 Potential to Achieve Goals: Fair Progress towards PT goals: Progressing toward goals    Frequency    Min 2X/week      PT Plan Current plan remains appropriate    Co-evaluation              AM-PAC PT "6 Clicks" Mobility   Outcome Measure  Help needed turning from your back to your side while in a flat bed without using bedrails?: None Help needed moving from lying on your back to sitting on the side of a flat bed without using bedrails?: A Little Help needed moving to and from a bed to a chair (including a wheelchair)?: A Little Help needed standing up from a chair using your arms (e.g., wheelchair or bedside chair)?: A Little Help needed to walk in hospital room?: A Lot Help needed climbing 3-5 steps with a railing? : Total 6 Click Score: 16    End of Session Equipment Utilized During Treatment: Gait belt Activity Tolerance: Patient limited by pain Patient left: in bed;with call bell/phone within reach;with bed alarm set Nurse Communication: Mobility status PT Visit Diagnosis: Muscle weakness (generalized) (M62.81);Adult, failure to thrive (R62.7) Pain - part of body:  (back)     Time: 7793-9030 PT Time Calculation (min) (ACUTE ONLY): 15 min  Charges:  $Therapeutic Activity: 8-22 mins                     Baxter Flattery, PT  Acute Rehab Dept ([place]) [phone] Pager  [phone]  05/11/2021    [facility] 05/11/2021, 11:00 AM

## 2021-05-12 LAB — RESP PANEL BY RT-PCR (FLU A&B, COVID) ARPGX2
Influenza A by PCR: NEGATIVE
Influenza B by PCR: NEGATIVE
SARS Coronavirus 2 by RT PCR: NEGATIVE

## 2021-05-12 LAB — COMPREHENSIVE METABOLIC PANEL
ALT: 26 U/L (ref 0–44)
AST: 12 U/L — ABNORMAL LOW (ref 15–41)
Albumin: 2.9 g/dL — ABNORMAL LOW (ref 3.5–5.0)
Alkaline Phosphatase: 35 U/L — ABNORMAL LOW (ref 38–126)
Anion gap: 7 (ref 5–15)
BUN: 24 mg/dL — ABNORMAL HIGH (ref 8–23)
CO2: 28 mmol/L (ref 22–32)
Calcium: 8.7 mg/dL — ABNORMAL LOW (ref 8.9–10.3)
Chloride: 98 mmol/L (ref 98–111)
Creatinine, Ser: 0.48 mg/dL — ABNORMAL LOW (ref 0.61–1.24)
GFR, Estimated: >60 mL/min (ref >60)
Glucose, Bld: 131 mg/dL — ABNORMAL HIGH (ref 70–99)
Potassium: 4.4 mmol/L (ref 3.5–5.1)
Sodium: 133 mmol/L — ABNORMAL LOW (ref 135–145)
Total Bilirubin: 0.4 mg/dL (ref 0.3–1.2)
Total Protein: 5.3 g/dL — ABNORMAL LOW (ref 6.5–8.1)

## 2021-05-12 LAB — CBC WITH DIFFERENTIAL/PLATELET
Abs Immature Granulocytes: 0.24 10*3/uL — ABNORMAL HIGH (ref 0.00–0.07)
Basophils Absolute: 0 10*3/uL (ref 0.0–0.1)
Basophils Relative: 0 %
Eosinophils Absolute: 0 10*3/uL (ref 0.0–0.5)
Eosinophils Relative: 0 %
HCT: 43.7 % (ref 39.0–52.0)
Hemoglobin: 14.3 g/dL (ref 13.0–17.0)
Immature Granulocytes: 2 %
Lymphocytes Relative: 3 %
Lymphs Abs: 0.3 10*3/uL — ABNORMAL LOW (ref 0.7–4.0)
MCH: 29.9 pg (ref 26.0–34.0)
MCHC: 32.7 g/dL (ref 30.0–36.0)
MCV: 91.2 fL (ref 80.0–100.0)
Monocytes Absolute: 0.8 10*3/uL (ref 0.1–1.0)
Monocytes Relative: 7 %
Neutro Abs: 9.4 10*3/uL — ABNORMAL HIGH (ref 1.7–7.7)
Neutrophils Relative %: 88 %
Platelets: 170 10*3/uL (ref 150–400)
RBC: 4.79 MIL/uL (ref 4.22–5.81)
RDW: 15 % (ref 11.5–15.5)
WBC: 10.8 10*3/uL — ABNORMAL HIGH (ref 4.0–10.5)
nRBC: 0 % (ref 0.0–0.2)

## 2021-05-12 LAB — MAGNESIUM: Magnesium: 2.6 mg/dL — ABNORMAL HIGH (ref 1.7–2.4)

## 2021-05-12 LAB — PHOSPHORUS: Phosphorus: 3.6 mg/dL (ref 2.5–4.6)

## 2021-05-12 MED ORDER — SODIUM CHLORIDE 0.9 % IV SOLN: INTRAVENOUS | Status: DC

## 2021-05-12 MED ORDER — DEXAMETHASONE 4 MG PO TABS
4.0000 mg | ORAL_TABLET | Freq: Two times a day (BID) | ORAL | Status: DC
  Administered 2021-05-13: 4 mg via ORAL
  Filled 2021-05-12: qty 1

## 2021-05-12 NOTE — TOC Progression Note (Signed)
Transition of Care [facility]) - Progression Note    Patient Details  Name: Christopher Dawson MRN: [id number] Date of Birth: 27-Sep-1955  Transition of [facility]) CM/SW Contact  Lennart Pall, LCSW Phone Number: 05/12/2021, 1:34 PM  Clinical Narrative:    Have reviewed SNF bed offers with pt and bed accepted at [facility] for admission tomorrow.  Have secured insurance auth [id number]).  MD/ RN aware need for new COVID test.     Expected Discharge Plan: [place] Barriers to Discharge: Continued Medical Work up  Expected Discharge Plan and Services Expected Discharge Plan: [place] In-house Referral: Clinical Social Work   Post Acute Care Choice: [place] Living arrangements for the past 2 months: Single Family Home                                       Social Determinants of Health (SDOH) Interventions    Readmission Risk Interventions No flowsheet data found.

## 2021-05-12 NOTE — Progress Notes (Signed)
PROGRESS NOTE    Christopher Dawson  EXB:[id number] DOB: 22-Oct-1955 DOA: 04/23/2021 PCP: [name], MD     Brief Narrative:  66 y.o. WM PMHx anxiety, depression, chronic lumbar radiculopathy, tobacco abuse, alcohol abuse.   Presented secondary to intractable back pain and inability to ambulate. 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 Radiation therapy initiated. 12/31 admitted with intractable back pain.  T9/T12 lytic lesion, lung mass and adrenal mass.  Oncology and neurosurgery consulted. 1/3 CT-guided biopsy.  Procedure aborted after 1 sample due to bleeding. 1/5 repeat CT-guided biopsy.  In addition therapy started based on limited report of adenocarcinoma. 1/10 CT head with and without contrast for staging negative for metastatic disease. 1/17, Radiation completed.  medically stable.  Awaiting placement.   Subjective: 1/19 A/O x4, pain controlled.  Understands will be discharged to SNF in the a.m.    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  -Neurosurgery consulted: no need for decompressive surgery.  -Radiation oncology consulted -Palliative care recommendations: Oxycodone -Decadron 4 mg  TID, will decrease to BID on 1/19  -Gabapentin 100 mg TID.  Titrate as needed -Radiation therapy started on 1/3.  Patient received 10 session last day  1/17. -Outpatient follow-up with medical oncology.   Right adrenal mass ([place])- (present on admission) -Indeterminate. Possible metastasis with recommendation for PET-CT. -After completion of rehabilitation at SNF will need to schedule PET-CT for evaluation of RIGHT adrenal mass   COPD (chronic obstructive pulmonary disease) ([place])- (present on admission) -Appears to be stable for now.  No exacerbation.   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  Medical oncology consulted and are on board.  -Repeat biopsy (1/5) LEFT paraspinal lesion: Poorly differentiated lung non-small cell carcinoma.  - Patient to continue radiation therapy.  -Will follow up with oncology Dr. Julien Nordmann outpatient.  They will send tissue for further IHC and foundation 1 testing. -Brain CT negative for any acute deformity.   Constipation- (present on admission) -1/18 bisacodyl 5 mg daily -1/18 increase lactulose 30 g BID -.Senokot 1 tablet BID   Hyponatremia- (present on admission) -Mildly low asymptomatic . -1/19 normal saline 70ml/hr   Alcohol use- (present on admission) -Prior history. -Cessation discussed. -Currently beyond withdrawal time limit. -Continue multivitamins.   Depression- (present on admission) -Mood is stable    Hyperlipidemia- (present on admission) -Not on statin.       DVT prophylaxis: Heparin subcu Code Status: Partial Family Communication:  Status is: Inpatient    Dispo: The patient is from: Home              Anticipated d/c is to: SNF              Anticipated d/c date is: 1 day              Patient currently medically stable  Consultants:  Oncology Radiation oncology Palliative care  Procedures/Significant Events:  (1/5) LEFT paraspinal lesion: Poorly differentiated lung non-small cell carcinoma.  - See comment.    COMMENT:  Immunohistochemistry shows the carcinoma is positive with cytokeratin 7,  MOC31 and cytokeratin 5/6 and shows patchy positivity with TTF-1 and p63  and there are are a few positive cells with p40.  The carcinoma is  negative with Napsin A, cytokeratin 20, CDX2 and S100.  The TTF-1  positivity is consistent with primary lung non-small cell carcinoma and  the differential includes adenosquamous carcinoma.    I have personally reviewed and interpreted all radiology studies and my findings are as above.  VENTILATOR SETTINGS:    Cultures   Antimicrobials:    Devices    LINES / TUBES:      Continuous Infusions:   Objective: Vitals:   05/11/21 1447 05/11/21 2149 05/12/21 0541 05/12/21 1315  BP: 129/86 109/85 (!) 133/92 136/79  Pulse: (!) 102 99 91 (!) 106  Resp: 18 17 16 18   Temp: 98.4 F (36.9 C) 97.7 F (36.5 C) 97.7 F (36.5 C) 98.6 F (37 C)  TempSrc: Oral Oral    SpO2: 96% 96% 98% 98%  Weight:      Height:        Intake/Output Summary (Last 24 hours) at 05/12/2021 1324 Last data filed at 05/12/2021 4081 Gross per 24 hour  Intake 120 ml  Output 900 ml  Net -780 ml    Filed Weights   05/02/21 1151 05/03/21 0500 05/08/21 0612  Weight: 69.9 kg 68.7 kg 67.8 kg    Examination:  General: A/O x4, No acute respiratory distress cachectic Eyes: negative scleral hemorrhage, negative anisocoria, negative icterus ENT: Negative Runny nose, negative gingival bleeding, Neck:  Negative scars, masses, torticollis, lymphadenopathy, JVD Lungs: Clear to auscultation bilaterally without wheezes or crackles Cardiovascular: Regular rate and rhythm without murmur gallop or rub normal S1 and S2 Abdomen: negative abdominal pain, nondistended, positive soft, bowel sounds, no rebound, no ascites, no appreciable mass Extremities: No significant cyanosis, clubbing, or edema bilateral lower extremities Skin: Negative rashes, lesions, ulcers Psychiatric:  Negative  depression, negative anxiety, negative fatigue, negative mania  Central nervous system:  Cranial nerves II through XII intact, tongue/uvula midline, all extremities muscle strength 5/5, sensation intact throughout, negative dysarthria, negative expressive aphasia, negative receptive aphasia.  .     Data Reviewed: Care during the described time interval was provided by me .  I have reviewed this patient's available data, including medical history, events of note, physical examination, and all test results as part of my evaluation.  CBC: Recent Labs  Lab 05/12/21 0247  WBC 10.8*  NEUTROABS 9.4*  HGB 14.3  HCT 43.7  MCV 91.2  PLT 448    Basic Metabolic Panel: Recent Labs  Lab 05/12/21 0247  NA 133*  K 4.4  CL 98  CO2 28  GLUCOSE 131*  BUN 24*  CREATININE 0.48*  CALCIUM 8.7*  MG 2.6*  PHOS 3.6    GFR: Estimated Creatinine Clearance: 87.1 mL/min (A) (by C-G formula based on SCr of 0.48 mg/dL (L)). Liver Function Tests: Recent Labs  Lab 05/12/21 0247  AST 12*  ALT 26  ALKPHOS 35*  BILITOT 0.4  PROT 5.3*  ALBUMIN 2.9*   No results for input(s): LIPASE, AMYLASE in the last 168 hours. No results for input(s): AMMONIA in the last 168 hours. Coagulation Profile: No results for input(s): INR, PROTIME in the last 168 hours. Cardiac Enzymes:  No results for input(s): CKTOTAL, CKMB, CKMBINDEX, TROPONINI in the last 168 hours. BNP (last 3 results) No results for input(s): PROBNP in the last 8760 hours. HbA1C: No results for input(s): HGBA1C in the last 72 hours. CBG: No results for input(s): GLUCAP in the last 168 hours. Lipid Profile: No results for input(s): CHOL, HDL, LDLCALC, TRIG, CHOLHDL, LDLDIRECT in the last 72 hours. Thyroid Function Tests: No results for input(s): TSH, T4TOTAL, FREET4, T3FREE, THYROIDAB in the last 72 hours. Anemia Panel: No results for input(s): VITAMINB12, FOLATE, FERRITIN, TIBC, IRON, RETICCTPCT in the last 72 hours. Sepsis Labs: No  results for input(s): PROCALCITON, LATICACIDVEN in the last 168 hours.  No results found for this or any previous visit (from the past 240 hour(s)).       Radiology Studies: No results found.      Scheduled Meds:  bisacodyl  5 mg Oral Daily   clomiPRAMINE  100 mg Oral QHS   dexamethasone  4 mg Oral Q8H   feeding supplement  237 mL Oral TID BM   folic acid  1 mg Oral Daily   gabapentin  100 mg Oral TID   heparin injection (subcutaneous)  5,000 Units Subcutaneous Q8H   lactulose  30 g Oral BID   multivitamin with minerals  1 tablet Oral Daily   nicotine  14 mg Transdermal Q24H   pantoprazole  40 mg Oral Daily   senna-docusate  1 tablet Oral BID   tamsulosin  0.4 mg Oral QPC supper   thiamine  100 mg Oral Daily   Continuous Infusions:   LOS: 19 days    Time spent:40 min    Cherie Lasalle, Geraldo Docker, MD [facility]   If 7PM-7AM, please contact night-coverage 05/12/2021, 1:24 PM

## 2021-05-12 NOTE — Plan of Care (Signed)
Plan of care reviewed and discussed with the patient. 

## 2021-05-13 ENCOUNTER — Encounter: Payer: Self-pay | Admitting: *Deleted

## 2021-05-13 LAB — COMPREHENSIVE METABOLIC PANEL
ALT: 27 U/L (ref 0–44)
AST: 13 U/L — ABNORMAL LOW (ref 15–41)
Albumin: 2.8 g/dL — ABNORMAL LOW (ref 3.5–5.0)
Alkaline Phosphatase: 35 U/L — ABNORMAL LOW (ref 38–126)
Anion gap: 7 (ref 5–15)
BUN: 31 mg/dL — ABNORMAL HIGH (ref 8–23)
CO2: 24 mmol/L (ref 22–32)
Calcium: 8.3 mg/dL — ABNORMAL LOW (ref 8.9–10.3)
Chloride: 103 mmol/L (ref 98–111)
Creatinine, Ser: 0.44 mg/dL — ABNORMAL LOW (ref 0.61–1.24)
GFR, Estimated: >60 mL/min (ref >60)
Glucose, Bld: 125 mg/dL — ABNORMAL HIGH (ref 70–99)
Potassium: 4.2 mmol/L (ref 3.5–5.1)
Sodium: 134 mmol/L — ABNORMAL LOW (ref 135–145)
Total Bilirubin: 0.4 mg/dL (ref 0.3–1.2)
Total Protein: 5.1 g/dL — ABNORMAL LOW (ref 6.5–8.1)

## 2021-05-13 LAB — CBC WITH DIFFERENTIAL/PLATELET
Abs Immature Granulocytes: 0.32 10*3/uL — ABNORMAL HIGH (ref 0.00–0.07)
Basophils Absolute: 0 10*3/uL (ref 0.0–0.1)
Basophils Relative: 0 %
Eosinophils Absolute: 0 10*3/uL (ref 0.0–0.5)
Eosinophils Relative: 0 %
HCT: 42.8 % (ref 39.0–52.0)
Hemoglobin: 14.1 g/dL (ref 13.0–17.0)
Immature Granulocytes: 3 %
Lymphocytes Relative: 3 %
Lymphs Abs: 0.4 10*3/uL — ABNORMAL LOW (ref 0.7–4.0)
MCH: 30 pg (ref 26.0–34.0)
MCHC: 32.9 g/dL (ref 30.0–36.0)
MCV: 91.1 fL (ref 80.0–100.0)
Monocytes Absolute: 0.9 10*3/uL (ref 0.1–1.0)
Monocytes Relative: 7 %
Neutro Abs: 11.1 10*3/uL — ABNORMAL HIGH (ref 1.7–7.7)
Neutrophils Relative %: 87 %
Platelets: 159 10*3/uL (ref 150–400)
RBC: 4.7 MIL/uL (ref 4.22–5.81)
RDW: 15 % (ref 11.5–15.5)
WBC: 12.8 10*3/uL — ABNORMAL HIGH (ref 4.0–10.5)
nRBC: 0 % (ref 0.0–0.2)

## 2021-05-13 LAB — MAGNESIUM: Magnesium: 2.4 mg/dL (ref 1.7–2.4)

## 2021-05-13 LAB — PHOSPHORUS: Phosphorus: 3.7 mg/dL (ref 2.5–4.6)

## 2021-05-13 MED ORDER — PANTOPRAZOLE SODIUM 40 MG PO TBEC: 40.0000 mg | DELAYED_RELEASE_TABLET | Freq: Every day | ORAL | 0 refills | Status: DC

## 2021-05-13 MED ORDER — SENNOSIDES-DOCUSATE SODIUM 8.6-50 MG PO TABS: 1.0000 | ORAL_TABLET | Freq: Two times a day (BID) | ORAL | 0 refills | Status: DC

## 2021-05-13 MED ORDER — ACETAMINOPHEN 325 MG PO TABS: 650.0000 mg | ORAL_TABLET | Freq: Four times a day (QID) | ORAL | 0 refills | Status: DC | PRN

## 2021-05-13 MED ORDER — TAMSULOSIN HCL 0.4 MG PO CAPS: 0.4000 mg | ORAL_CAPSULE | Freq: Every day | ORAL | 0 refills | Status: DC

## 2021-05-13 MED ORDER — ONDANSETRON HCL 4 MG PO TABS: 4.0000 mg | ORAL_TABLET | Freq: Four times a day (QID) | ORAL | 0 refills | Status: DC | PRN

## 2021-05-13 MED ORDER — HYDROXYZINE HCL 25 MG PO TABS: 25.0000 mg | ORAL_TABLET | Freq: Three times a day (TID) | ORAL | 0 refills | Status: DC | PRN

## 2021-05-13 MED ORDER — ADULT MULTIVITAMIN W/MINERALS CH: 1.0000 | ORAL_TABLET | Freq: Every day | ORAL | 0 refills | Status: DC

## 2021-05-13 MED ORDER — GABAPENTIN 100 MG PO CAPS: 100.0000 mg | ORAL_CAPSULE | Freq: Three times a day (TID) | ORAL | 0 refills | Status: DC

## 2021-05-13 MED ORDER — OXYCODONE HCL 5 MG PO TABS: 5.0000 mg | ORAL_TABLET | ORAL | 0 refills | Status: DC | PRN

## 2021-05-13 MED ORDER — BISACODYL 5 MG PO TBEC: 5.0000 mg | DELAYED_RELEASE_TABLET | Freq: Every day | ORAL | 0 refills | Status: DC

## 2021-05-13 MED ORDER — NICOTINE 14 MG/24HR TD PT24: 14.0000 mg | MEDICATED_PATCH | TRANSDERMAL | 0 refills | Status: DC

## 2021-05-13 MED ORDER — CLOMIPRAMINE HCL 25 MG PO CAPS: 100.0000 mg | ORAL_CAPSULE | Freq: Every day | ORAL | 0 refills | Status: DC

## 2021-05-13 MED ORDER — THIAMINE HCL 100 MG PO TABS: 100.0000 mg | ORAL_TABLET | Freq: Every day | ORAL | 0 refills | Status: DC

## 2021-05-13 MED ORDER — LACTULOSE 10 GM/15ML PO SOLN: 30.0000 g | Freq: Two times a day (BID) | ORAL | 0 refills | Status: DC

## 2021-05-13 MED ORDER — ENSURE ENLIVE PO LIQD: 237.0000 mL | Freq: Three times a day (TID) | ORAL | 12 refills | Status: DC

## 2021-05-13 MED ORDER — FOLIC ACID 1 MG PO TABS: 1.0000 mg | ORAL_TABLET | Freq: Every day | ORAL | 0 refills | Status: DC

## 2021-05-13 NOTE — Discharge Summary (Signed)
Physician Discharge Summary  Christopher Dawson SWN:[id number] DOB: 1955-08-09 DOA: 04/23/2021  PCP: [name], MD  Admit date: 04/23/2021 Discharge date: 05/13/2021  Time spent: 30 minutes  Recommendations for Outpatient Follow-up:   Cancer related pain- (present on admission) -Secondary T-spine/L-spine lesions concerning for metastasis with associated T12 destructive lesion and associated pathologic fracture/epidural tumor confirmed on MRI.  -Neurosurgery consulted: no need for decompressive surgery.  -Radiation oncology consulted -Palliative care recommendations: Oxycodone -Decadron 4 mg  TID, will decrease to BID on 1/19  -Gabapentin 100 mg TID.  Titrate as needed -Radiation therapy started on 1/3.  Patient received 10 session last day 1/17. -Outpatient follow-up with medical oncology.   Right adrenal mass ([place])- (present on admission) -Indeterminate. Possible metastasis with recommendation for PET-CT. -After completion of rehabilitation at SNF will need to schedule PET-CT for evaluation of RIGHT adrenal mass   COPD (chronic obstructive pulmonary disease) ([place])- (present on admission) -Appears to be stable for now.  No exacerbation.   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  Medical oncology consulted and are on board.  -Repeat biopsy (1/5) LEFT paraspinal lesion: Poorly differentiated lung non-small cell carcinoma.  - Patient to continue radiation therapy.  -Will follow up with oncology Dr. Julien Nordmann outpatient.  They will send tissue for further IHC and foundation 1 testing. -Brain CT negative for any acute deformity.   Constipation- (present on admission) -1/18 bisacodyl 5 mg daily -1/18 increase lactulose 30 g BID -.Senokot 1 tablet BID   Hyponatremia- (present on admission) -Mildly low asymptomatic . -1/19 normal  saline 13ml/hr   Alcohol use- (present on admission) -Prior history. -Cessation discussed. -Currently beyond withdrawal time limit. -Continue multivitamins.   Depression- (present on admission) -Mood is stable    Hyperlipidemia- (present on admission) -Not on statin.  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 WM PMHx anxiety, depression, chronic lumbar radiculopathy, tobacco abuse, alcohol abuse.    Presented secondary to intractable back pain and inability to ambulate. 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 Radiation therapy initiated. 12/31 admitted with intractable back pain.  T9/T12 lytic lesion, lung mass and adrenal mass.  Oncology and neurosurgery consulted. 1/3 CT-guided biopsy.  Procedure aborted after 1 sample due to bleeding. 1/5 repeat CT-guided biopsy.  In addition therapy started based on limited report of adenocarcinoma. 1/10 CT head with and without contrast for staging negative for metastatic disease. 1/17, Radiation completed.  medically stable.  Awaiting placement  Hospital Course:  See above   Procedures: (1/5) LEFT paraspinal lesion: Poorly differentiated lung non-small cell carcinoma.  - See comment.   COMMENT:  Immunohistochemistry shows the carcinoma is positive with cytokeratin 7,  MOC31 and cytokeratin 5/6 and shows patchy positivity with TTF-1 and p63  and there are are  a few positive cells with p40.  The carcinoma is  negative with Napsin A, cytokeratin 20, CDX2 and S100.  The TTF-1  positivity is consistent with primary lung non-small cell carcinoma and  the differential includes adenosquamous carcinoma.    Consultations: Oncology Radiation oncology Palliative care   Cultures  1/19 negative SARS coronavirus 1/19 negative influenza A/B    Antibiotics Anti-infectives (From admission, onward)    None         Discharge Exam: Vitals:   05/12/21 0541 05/12/21 1315 05/12/21 2022 05/13/21 0558  BP: (!) 133/92 136/79 127/67 104/87  Pulse: 91 (!) 106 (!) 105 88  Resp: 16 18 17 17   Temp: 97.7 F (36.5 C) 98.6 F (37 C) 98 F (36.7 C) 97.7 F (36.5 C)  TempSrc:    Oral  SpO2: 98% 98% 96% 97%  Weight:      Height:       General: A/O x4, No acute respiratory distress cachectic Eyes: negative scleral hemorrhage, negative anisocoria, negative icterus ENT: Negative Runny nose, negative gingival bleeding, Neck:  Negative scars, masses, torticollis, lymphadenopathy, JVD Lungs: Clear to auscultation bilaterally without wheezes or crackles Cardiovascular: Regular rate and rhythm without murmur gallop or rub normal S1 and S2    Discharge Instructions  Discharge Instructions     Ambulatory referral to Social Work   Complete by: As directed       Allergies as of 05/13/2021       Reactions   Codeine Other (See Comments)   Made me feel "spaced out"        Medication List     TAKE these medications    acetaminophen 325 MG tablet Commonly known as: TYLENOL Take 2 tablets (650 mg total) by mouth every 6 (six) hours as needed for mild pain (or Fever >/= 101).   bisacodyl 5 MG EC tablet Commonly known as: DULCOLAX Take 1 tablet (5 mg total) by mouth daily.   clomiPRAMINE 50 MG capsule Commonly known as: ANAFRANIL TAKE 1 CAPSULE(50 MG) BY MOUTH AT BEDTIME What changed: See the new instructions.   clomiPRAMINE 25 MG  capsule Commonly known as: ANAFRANIL Take 4 capsules (100 mg total) by mouth at bedtime. What changed:  how much to take how to take this when to take this additional instructions   feeding supplement Liqd Take 237 mLs by mouth 3 (three) times daily between meals.   folic acid 1 MG tablet Commonly known as: FOLVITE Take 1 tablet (1 mg total) by mouth daily.   gabapentin 100 MG capsule Commonly known as: NEURONTIN Take 1 capsule (100 mg total) by mouth 3 (three) times daily.   hydrOXYzine 25 MG tablet Commonly known as: ATARAX Take 1 tablet (25 mg total) by mouth 3 (three) times daily as needed for anxiety.   lactulose 10 GM/15ML solution Commonly known as: CHRONULAC Take 45 mLs (30 g total) by mouth 2 (two) times daily.   multivitamin with minerals Tabs tablet Take 1 tablet by mouth daily. What changed: when to take this   nicotine 14 mg/24hr patch Commonly known as: NICODERM CQ - dosed in mg/24 hours Place 1 patch (14 mg total) onto the skin daily.   ondansetron 4 MG tablet Commonly known as: ZOFRAN Take 1 tablet (4 mg total) by mouth every 6 (six) hours as needed for nausea.   oxyCODONE 5 MG immediate release tablet Commonly known as: Oxy IR/ROXICODONE Take 1-2 tablets (5-10 mg total) by mouth every 4 (four) hours as needed for moderate pain.   pantoprazole 40  MG tablet Commonly known as: PROTONIX Take 1 tablet (40 mg total) by mouth daily.   senna-docusate 8.6-50 MG tablet Commonly known as: Senokot-S Take 1 tablet by mouth 2 (two) times daily.   tamsulosin 0.4 MG Caps capsule Commonly known as: FLOMAX Take 1 capsule (0.4 mg total) by mouth daily after supper.   thiamine 100 MG tablet Take 1 tablet (100 mg total) by mouth daily.               Durable Medical Equipment  (From admission, onward)           Start     Ordered   04/28/21 0847  For home use only DME standard manual wheelchair with seat cushion  Once       Comments: Patient  suffers from intractable back pain which impairs their ability to perform daily activities like bathing, dressing, and toileting in the home.  A cane, crutch, or walker will not resolve issue with performing activities of daily living. A wheelchair will allow patient to safely perform daily activities. Patient can safely propel the wheelchair in the home or has a caregiver who can provide assistance. Length of need 6 months . Accessories: elevating leg rests (ELRs), wheel locks, extensions and anti-tippers.   04/28/21 0847           Allergies  Allergen Reactions   Codeine Other (See Comments)    Made me feel "spaced out"    Follow-up Information     Trula Slade, DPM .   Specialty: Podiatry Contact information: [place] [address] [phone]         [facility] [place] Follow up.   Why: Per pateint request Contact information: [place] [address] [phone]                  The results of significant diagnostics from this hospitalization (including imaging, microbiology, ancillary and laboratory) are listed below for reference.    Significant Diagnostic Studies: DG Lumbar Spine Complete  Result Date: 04/23/2021 CLINICAL DATA:  Back pain. EXAM: LUMBAR SPINE - COMPLETE 4+ VIEW COMPARISON:  None. FINDINGS: There is dextroconvex curvature of the lumbar spine. There is no evidence for acute fracture or dislocation of the lumbar spine. There is questionable mild compression deformity of T12, age indeterminate. Mild degenerative endplate osteophytes are seen throughout the lumbar spine. Disc spaces are grossly maintained. Soft tissues are within normal limits. IMPRESSION: 1. Questionable mild compression deformity of T12, age indeterminate. Correlate for point tenderness. Consider dedicated imaging of this level. 2. Levoconvex curvature of the lumbar spine. 3. Mild degenerative changes. Electronically  Signed   By: Ronney Asters M.D.   On: 04/23/2021 16:55   CT HEAD W & WO CONTRAST (5MM)  Result Date: 05/04/2021 CLINICAL DATA:  Staging of non-small cell lung cancer. EXAM: CT HEAD WITHOUT AND WITH CONTRAST TECHNIQUE: Contiguous axial images were obtained from the base of the skull through the vertex without and with intravenous contrast. RADIATION DOSE REDUCTION: This exam was performed according to the departmental dose-optimization program which includes automated exposure control, adjustment of the mA and/or kV according to patient size and/or use of iterative reconstruction technique. CONTRAST:  42mL OMNIPAQUE IOHEXOL 350 MG/ML SOLN COMPARISON:  05/09/2017 FINDINGS: Brain: Mild generalized volume loss. Mild chronic small-vessel ischemic change of the hemispheric white matter. No sign of recent infarction, mass lesion, hemorrhage, hydrocephalus or extra-axial collection. After contrast administration, no abnormal enhancement occurs. Vascular:  There is atherosclerotic calcification of the major vessels at the base of the brain. Skull: Negative Sinuses/Orbits: Clear/normal Other: None IMPRESSION: No evidence of metastatic disease. Mild generalized volume loss and chronic small-vessel ischemic change of the cerebral hemispheric white matter. Electronically Signed   By: Nelson Chimes M.D.   On: 05/04/2021 10:29   CT Thoracic Spine Wo Contrast  Result Date: 04/23/2021 CLINICAL DATA:  Worsening chronic back pain. EXAM: CT THORACIC AND LUMBAR SPINE WITHOUT CONTRAST TECHNIQUE: Multidetector CT imaging of the thoracic and lumbar spine was performed without contrast. Multiplanar CT image reconstructions were also generated. COMPARISON:  Lumbar spine radiographs 04/23/2021 FINDINGS: CT THORACIC SPINE FINDINGS Alignment: Exaggerated upper thoracic kyphosis.  No listhesis. Vertebrae: Permeative lytic destructive lesion of the vertebral body and left-sided posterior elements at T12 with associated pathologic fracture  and mild vertebral body height loss. 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 2 cm lytic lesion in the left lateral aspect of the T9 vertebral body with extraosseous tumor extension. Paraspinal and other soft tissues: Lower thoracic paravertebral soft tissue suggestive of tumor, possibly with superimposed hematoma, extending from T11-L1. Centrilobular and paraseptal emphysema. Disc levels: Mild-to-moderate thoracic spondylosis. At least mild spinal stenosis is suspected at T12 due to epidural tumor. Tumor also encroaches on the left-sided neural foramina at T11-12 and T12-L1. CT LUMBAR SPINE FINDINGS Segmentation: 5 lumbar type vertebrae. Alignment: Trace retrolisthesis of L4 on L5 and L5 on S1. Slight right convex curvature of the upper lumbar spine. Vertebrae: No acute fracture or suspicious osseous lesion. Paraspinal and other soft tissues: Mild abdominal aortic atherosclerosis. Disc levels: Mild disc degeneration, greatest at L4-5 where disc bulging, spurring, and mild facet hypertrophy result in borderline spinal stenosis and mild right lateral recess and right neural foraminal stenosis. IMPRESSION: 1. Destructive lesion involving the T12 vertebral body and left-sided posterior elements with pathologic vertebral body fracture and epidural tumor resulting in at least mild spinal stenosis. Smaller lesion in the T9 vertebral body. Findings are most concerning for metastatic disease. 2. Aortic Atherosclerosis (ICD10-I70.0) and Emphysema (ICD10-J43.9). Electronically Signed   By: Logan Bores M.D.   On: 04/23/2021 18:41   CT Lumbar Spine Wo Contrast  Result Date: 04/23/2021 CLINICAL DATA:  Worsening chronic back pain. EXAM: CT THORACIC AND LUMBAR SPINE WITHOUT CONTRAST TECHNIQUE: Multidetector CT imaging of the thoracic and lumbar spine was performed without contrast. Multiplanar CT image reconstructions were also generated. COMPARISON:  Lumbar spine radiographs  04/23/2021 FINDINGS: CT THORACIC SPINE FINDINGS Alignment: Exaggerated upper thoracic kyphosis.  No listhesis. Vertebrae: Permeative lytic destructive lesion of the vertebral body and left-sided posterior elements at T12 with associated pathologic fracture and mild vertebral body height loss. 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 2 cm lytic lesion in the left lateral aspect of the T9 vertebral body with extraosseous tumor extension. Paraspinal and other soft tissues: Lower thoracic paravertebral soft tissue suggestive of tumor, possibly with superimposed hematoma, extending from T11-L1. Centrilobular and paraseptal emphysema. Disc levels: Mild-to-moderate thoracic spondylosis. At least mild spinal stenosis is suspected at T12 due to epidural tumor. Tumor also encroaches on the left-sided neural foramina at T11-12 and T12-L1. CT LUMBAR SPINE FINDINGS Segmentation: 5 lumbar type vertebrae. Alignment: Trace retrolisthesis of L4 on L5 and L5 on S1. Slight right convex curvature of the upper lumbar spine. Vertebrae: No acute fracture or suspicious osseous lesion. Paraspinal and other soft tissues: Mild abdominal aortic atherosclerosis. Disc levels: Mild disc degeneration, greatest at  L4-5 where disc bulging, spurring, and mild facet hypertrophy result in borderline spinal stenosis and mild right lateral recess and right neural foraminal stenosis. IMPRESSION: 1. Destructive lesion involving the T12 vertebral body and left-sided posterior elements with pathologic vertebral body fracture and epidural tumor resulting in at least mild spinal stenosis. Smaller lesion in the T9 vertebral body. Findings are most concerning for metastatic disease. 2. Aortic Atherosclerosis (ICD10-I70.0) and Emphysema (ICD10-J43.9). Electronically Signed   By: Logan Bores M.D.   On: 04/23/2021 18:41   MR THORACIC SPINE W WO CONTRAST  Result Date: 04/24/2021 CLINICAL DATA:  Back pain.  Metastatic  disease. EXAM: MRI THORACIC AND LUMBAR SPINE WITHOUT AND WITH CONTRAST TECHNIQUE: Multiplanar and multiecho pulse sequences of the thoracic and lumbar spine were obtained without and with intravenous contrast. CONTRAST:  55mL GADAVIST GADOBUTROL 1 MMOL/ML IV SOLN COMPARISON:  CT thoracic and lumbar spine 04/23/2021 FINDINGS: MRI THORACIC SPINE FINDINGS Alignment: Exaggerated upper thoracic kyphosis. No significant listhesis. Vertebrae: Abnormal T1 hypointensity, T2/STIR hyperintensity, and enhancement throughout the T9 vertebral body including a more focal 2 cm lesion in the left aspect of the vertebral body corresponding to the lytic lesion on CT. 2 cm lesion in the posterior aspect of the T5 vertebral body. Subcentimeter enhancing lesion in the right T11 pars. 1 cm enhancing lesion in the base of the T10 spinous process. Subcentimeter enhancing lesion versus vascular enhancement in the left T10 pedicle. Destructive lesion involving the T12 vertebral body and left-sided posterior elements with pathologic fracture as described on CT with extraosseous tumor encroaching upon the left T11-12 and T12-L1 neural foramina and with ventral and left-sided epidural tumor resulting in mild spinal stenosis without cord compression. Mild edema and enhancement along the endplates at Q0-3, likely degenerative. Cord:  Normal cord signal. 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 Disc levels: Mild spinal stenosis at T12 due to epidural tumor. Mild thoracic spondylosis elsewhere without significant degenerative stenosis. MRI LUMBAR SPINE FINDINGS Segmentation:  Standard. Alignment: Slight right convex curvature of the lumbar spine. Trace retrolisthesis of L4 on L5 and L5 on S1. Vertebrae: Enhancing lesions in the L1 vertebral body and spinous process without evidence of associated epidural tumor subcentimeter enhancing lesion in the right L5 pedicle. No fracture. Conus medullaris: Extends to the L1-2 level and  appears normal. Paraspinal and other soft tissues: Paraspinal tumor at T12 as described above extending to the L1 level. Disc levels: L1-2: Disc desiccation. Mild disc bulging and mild facet hypertrophy without stenosis. L2-3: Minimal disc bulging and mild-to-moderate facet hypertrophy without stenosis. L3-4: Disc desiccation. Disc bulging, a left foraminal disc osteophyte complex, and mild to moderate facet hypertrophy without significant stenosis. L4-5: Disc desiccation and mild-to-moderate disc space narrowing. Disc bulging, a small central disc protrusion, and mild-to-moderate facet and ligamentum flavum hypertrophy result in borderline to mild bilateral lateral recess and right neural foraminal stenosis without spinal stenosis. L5-S1: Disc desiccation and mild-to-moderate disc space narrowing. Disc bulging, a small left central disc protrusion, and mild facet hypertrophy result in borderline to mild left lateral recess stenosis without spinal or neural foraminal stenosis. IMPRESSION: 1. Multiple enhancing thoracic and lumbar spine lesions consistent with metastases. 2. 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 Electronically Signed   By: Logan Bores M.D.   On: 04/24/2021 13:58   MR Lumbar Spine W Wo Contrast  Result Date: 04/24/2021 CLINICAL DATA:  Back pain.  Metastatic  disease. EXAM: MRI THORACIC AND LUMBAR SPINE WITHOUT AND WITH CONTRAST TECHNIQUE: Multiplanar and multiecho pulse sequences of the thoracic and lumbar spine were obtained without and with intravenous contrast. CONTRAST:  41mL GADAVIST GADOBUTROL 1 MMOL/ML IV SOLN COMPARISON:  CT thoracic and lumbar spine 04/23/2021 FINDINGS: MRI THORACIC SPINE FINDINGS Alignment: Exaggerated upper thoracic kyphosis. No significant listhesis. Vertebrae: Abnormal T1 hypointensity, T2/STIR hyperintensity, and enhancement throughout the T9  vertebral body including a more focal 2 cm lesion in the left aspect of the vertebral body corresponding to the lytic lesion on CT. 2 cm lesion in the posterior aspect of the T5 vertebral body. Subcentimeter enhancing lesion in the right T11 pars. 1 cm enhancing lesion in the base of the T10 spinous process. Subcentimeter enhancing lesion versus vascular enhancement in the left T10 pedicle. Destructive lesion involving the T12 vertebral body and left-sided posterior elements with pathologic fracture as described on CT with extraosseous tumor encroaching upon the left T11-12 and T12-L1 neural foramina and with ventral and left-sided epidural tumor resulting in mild spinal stenosis without cord compression. Mild edema and enhancement along the endplates at A2-1, likely degenerative. Cord:  Normal cord signal. 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 Disc levels: Mild spinal stenosis at T12 due to epidural tumor. Mild thoracic spondylosis elsewhere without significant degenerative stenosis. MRI LUMBAR SPINE FINDINGS Segmentation:  Standard. Alignment: Slight right convex curvature of the lumbar spine. Trace retrolisthesis of L4 on L5 and L5 on S1. Vertebrae: Enhancing lesions in the L1 vertebral body and spinous process without evidence of associated epidural tumor subcentimeter enhancing lesion in the right L5 pedicle. No fracture. Conus medullaris: Extends to the L1-2 level and appears normal. Paraspinal and other soft tissues: Paraspinal tumor at T12 as described above extending to the L1 level. Disc levels: L1-2: Disc desiccation. Mild disc bulging and mild facet hypertrophy without stenosis. L2-3: Minimal disc bulging and mild-to-moderate facet hypertrophy without stenosis. L3-4: Disc desiccation. Disc bulging, a left foraminal disc osteophyte complex, and mild to moderate facet hypertrophy without significant stenosis. L4-5: Disc desiccation and mild-to-moderate disc space narrowing. Disc  bulging, a small central disc protrusion, and mild-to-moderate facet and ligamentum flavum hypertrophy result in borderline to mild bilateral lateral recess and right neural foraminal stenosis without spinal stenosis. L5-S1: Disc desiccation and mild-to-moderate disc space narrowing. Disc bulging, a small left central disc protrusion, and mild facet hypertrophy result in borderline to mild left lateral recess stenosis without spinal or neural foraminal stenosis. IMPRESSION: 1. Multiple enhancing thoracic and lumbar spine lesions consistent with metastases. 2. 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 Electronically Signed   By: Logan Bores M.D.   On: 04/24/2021 13:58   CT CHEST ABDOMEN PELVIS W CONTRAST  Result Date: 04/23/2021 CLINICAL DATA:  Permeative lytic destructive lesion of the T12 vertebral body and left-sided posterior elements with pathologic fracture and mild vertebral height loss, worrisome for metastatic disease. Evaluate for primary. 2 cm lytic lesion left lateral T9 body. EXAM: CT CHEST, ABDOMEN, AND PELVIS WITH CONTRAST TECHNIQUE: Multidetector CT imaging of the chest, abdomen and pelvis was performed following the standard protocol during bolus administration of intravenous contrast. CONTRAST:  10mL OMNIPAQUE IOHEXOL 350 MG/ML SOLN COMPARISON:  CT thoracic and lumbar spine earlier today. FINDINGS: CT CHEST FINDINGS Cardiovascular: The cardiac size is normal. There are scattered calcifications in the LAD coronary artery. Early changes of aortic atherosclerosis. The ascending aortic  segment slightly aneurysmal 4.1 cm with the remainder within normal caliber limits and with normal great vessels. There is no arteriovenous dilatation or central arterial embolus. No pericardial effusion. Mediastinum/Nodes: There are slightly prominent left hilar lymph nodes with a lymph node  measuring 1.2 cm in short axis in the anterior left hilum on series 2 axial 36 and another more posteriorly measuring 1 cm in short axis on axial 41. There is no bulky, encasing or further intrathoracic adenopathy, no mediastinal or axillary mass or thyroid lesions. There is no tracheal or central airway filling defect or significant esophageal thickening. Minimally small hiatal hernia. Lungs/Pleura: No pleural effusion, thickening or pneumothorax. There is a spiculated mass in the left upper lobe laterally at mid field measuring 1.8 x 1.6 x 1.0 cm, with pleural stranding lateral to it. 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 Bilateral apical pleuroparenchymal scar-like opacities are noted, with bilateral paraseptal emphysematous changes in the lungs predominating in the upper lobes, relatively milder centrilobular changes lung apices. There are patchy hazy opacities in the lingula anteriorly which could be chronic change or pneumonitis. There are scattered linear scar-like opacities in the bases. No other nodules or infiltrates are observed. 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 There is left paraspinal tumor extension at the level of the vertebral body as well. A 2 cm T9 vertebral body lytic lesion is again noted to the left anteriorly, with adjacent paraspinal soft tissue thickening without intracanalicular extension. No other focal bone lesions are seen in the thoracic osseous structures. Asymmetric elevation right hemidiaphragm is also seen. CT ABDOMEN PELVIS FINDINGS Hepatobiliary: Slightly steatotic liver without mass enhancement. The gallbladder and bile ducts unremarkable. Pancreas: Moderately fatty atrophic without mass enhancement or  ductal dilatation. Spleen: Unremarkable. Adrenals/Urinary Tract: Indeterminate 1.5 cm heterogeneous right adrenal nodule. There is no left adrenal mass , no focal renal cortical abnormality. There are either are few small scattered nonobstructive caliceal stones in the kidneys or early contrast excretion mimicking stones. There is no obstructive stone or hydroureteronephrosis no bladder thickening. Stomach/Bowel: The gastric wall is contracted. The unopacified small bowel demonstrating fold thickening in the duodenum and jejunum with suspected fold thickening in the underdistended proximal stomach. There is no small bowel obstruction or inflammation. The appendix is normal. Moderate constipation is seen. Left colonic diverticula without diverticulitis. There is moderate wall thickening versus nondistention in the rectum. Vascular/Lymphatic: Aortic atherosclerosis. No enlarged abdominal or pelvic lymph nodes. Reproductive: Enlarged prostate, 5.5 cm transverse with bladder base impression. Other: There is no free air, hemorrhage or fluid. Small umbilical fat hernia. Musculoskeletal: No regional destructive bone lesions. Moderate left-greater-than-right hip DJD. Degenerative changes and mild dextroscoliosis lumbar spine. IMPRESSION: 1. 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 There are 2 slightly prominent left hilar lymph nodes but no further chest adenopathy. 2. 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 3. Patchy haziness in the anterior lingula which could be chronic change or pneumonitis. 4. Indeterminate heterogeneous 1.5 cm right adrenal mass, possible  metastasis.  PET-CT may be helpful. 5. COPD and scarring change. 6. 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 No AAA. 7. Moderate thickening of the rectum versus underdistention. Further evaluation recommended. 8. Enlarged prostate. 9. Constipation, and additional findings suggesting gastroenteritis. No small bowel obstruction or inflammatory changes. 10. No other CT evidence of metastatic disease. Additional findings discussed above. Electronically Signed   By: Telford Nab M.D.   On: 04/23/2021 21:03   CT BIOPSY  Result Date: 04/28/2021 Criselda Peaches, MD     04/28/2021  4:22 PM Interventional Radiology Procedure Note Procedure: Repeat CT bx of T12 metastatic lesion Complications: None Estimated Blood Loss: None Recommendations: - Return to floor - Path sent Signed, Criselda Peaches, MD   CT BIOPSY  Result Date: 04/26/2021 INDICATION: 66 year old with left lung lesion and bone lesions. Findings are concerning for metastatic disease and needs a tissue diagnosis. EXAM: CT-GUIDED BIOPSY OF PARASPINAL LESION Physician: Stephan Minister. Henn, MD MEDICATIONS: Moderate sedation ANESTHESIA/SEDATION: Moderate (conscious) sedation was employed during this procedure. A total of Versed 2.0mg  and fentanyl 100 mcg was administered intravenously at the order of the provider performing the procedure. Total intra-service moderate sedation time: 28 minutes. Patient's level of consciousness and vital signs were monitored continuously by radiology nurse throughout the procedure under the supervision of the provider performing the procedure. COMPLICATIONS: None immediate. PROCEDURE: The procedure was explained to the patient. The risks and benefits of the procedure were discussed and the patient's questions were addressed. Informed consent was obtained from the patient. The patient was placed prone on CT table. Images at the thoracolumbar spine were obtained. The left paraspinal soft tissue  at T12-L1 was identified and targeted. The overlying skin was prepped with chlorhexidine and sterile field was created. Skin was anesthetized using 1% lidocaine. Small incision was made. Using CT guidance, 17 gauge coaxial needle was directed along the lateral aspect of the left paraspinal tissue. A single core biopsy obtained with an 18 gauge device. Specimen placed in formalin. There was brisk bleeding from the coaxial needle following the single core biopsy. Stylet was replaced within the coaxial needle and checked for bleeding periodically by removing the stylet. There continued to be some bleeding after a few minutes. Needle was slowly withdrawn until the bleeding appeared to stop. There was concern for a small hematoma developing under the skin. As a result, the needle was completely removed and manual compression was placed for approximately 5 minutes. Follow up CT images were obtained. Bandage placed over the puncture site. FINDINGS: Destructive lesion involving the T12 vertebral body with abnormal soft tissue along the left paraspinal region at T12-L1. The biopsy needle was placed along the lateral aspect of this abnormal paraspinal tissue. Single core biopsy was obtained. Brisk bleeding from the 17 gauge coaxial needle following the core biopsy. Due to the bleeding and concern for a small soft tissue hematoma, no additional core biopsies were obtained. IMPRESSION: CT-guided core biopsy of the left paraspinal lesion at the level of T12-L1. Electronically Signed   By: Markus Daft M.D.   On: 04/26/2021 14:44   DG Abd Portable 1V  Result Date: 05/09/2021 CLINICAL DATA:  Constipation EXAM: PORTABLE ABDOMEN - 1 VIEW COMPARISON:  None. FINDINGS: Supine frontal view of the abdomen and pelvis excludes the right flank and bilateral hemidiaphragms by collimation. No bowel obstruction or ileus. Mild retained stool within the colon. No masses or abnormal calcifications. Bilateral hip osteoarthritis, left greater  than right. IMPRESSION:  1. Mild fecal retention.  No bowel obstruction. Electronically Signed   By: Randa Ngo M.D.   On: 05/09/2021 16:19    Microbiology: Recent Results (from the past 240 hour(s))  Resp Panel by RT-PCR (Flu A&B, Covid) Nasopharyngeal Swab     Status: None   Collection Time: 05/12/21  2:04 PM   Specimen: Nasopharyngeal Swab; Nasopharyngeal(NP) swabs in vial transport medium  Result Value Ref Range Status   SARS Coronavirus 2 by RT PCR NEGATIVE NEGATIVE Final    Comment: (NOTE) SARS-CoV-2 target nucleic acids are NOT DETECTED.  The SARS-CoV-2 RNA is generally detectable in upper respiratory specimens during the acute phase of infection. The lowest concentration of SARS-CoV-2 viral copies this assay can detect is 138 copies/mL. A negative result does not preclude SARS-Cov-2 infection and should not be used as the sole basis for treatment or other patient management decisions. A negative result may occur with  improper specimen collection/handling, submission of specimen other than nasopharyngeal swab, presence of viral mutation(s) within the areas targeted by this assay, and inadequate number of viral copies(<138 copies/mL). A negative result must be combined with clinical observations, patient history, and epidemiological information. The expected result is Negative.  Fact Sheet for Patients:  EntrepreneurPulse.com.au  Fact Sheet for Healthcare Providers:  IncredibleEmployment.be  This test is no t yet approved or cleared by the [place] FDA and  has been authorized for detection and/or diagnosis of SARS-CoV-2 by FDA under an Emergency Use Authorization (EUA). This EUA will remain  in effect (meaning this test can be used) for the duration of the COVID-19 declaration under Section 564(b)(1) of the Act, 21 U.S.C.section 360bbb-3(b)(1), unless the authorization is terminated  or revoked sooner.       Influenza A by  PCR NEGATIVE NEGATIVE Final   Influenza B by PCR NEGATIVE NEGATIVE Final    Comment: (NOTE) The Xpert Xpress SARS-CoV-2/FLU/RSV plus assay is intended as an aid in the diagnosis of influenza from Nasopharyngeal swab specimens and should not be used as a sole basis for treatment. Nasal washings and aspirates are unacceptable for Xpert Xpress SARS-CoV-2/FLU/RSV testing.  Fact Sheet for Patients: EntrepreneurPulse.com.au  Fact Sheet for Healthcare Providers: IncredibleEmployment.be  This test is not yet approved or cleared by the [place] FDA and has been authorized for detection and/or diagnosis of SARS-CoV-2 by FDA under an Emergency Use Authorization (EUA). This EUA will remain in effect (meaning this test can be used) for the duration of the COVID-19 declaration under Section 564(b)(1) of the Act, 21 U.S.C. section 360bbb-3(b)(1), unless the authorization is terminated or revoked.  Performed at [facility], [place] [address], [place],  [address]      Labs: Basic Metabolic Panel: Recent Labs  Lab 05/12/21 0247 05/13/21 0249  NA 133* 134*  K 4.4 4.2  CL 98 103  CO2 28 24  GLUCOSE 131* 125*  BUN 24* 31*  CREATININE 0.48* 0.44*  CALCIUM 8.7* 8.3*  MG 2.6* 2.4  PHOS 3.6 3.7   Liver Function Tests: Recent Labs  Lab 05/12/21 0247 05/13/21 0249  AST 12* 13*  ALT 26 27  ALKPHOS 35* 35*  BILITOT 0.4 0.4  PROT 5.3* 5.1*  ALBUMIN 2.9* 2.8*   No results for input(s): LIPASE, AMYLASE in the last 168 hours. No results for input(s): AMMONIA in the last 168 hours. CBC: Recent Labs  Lab 05/12/21 0247 05/13/21 0249  WBC 10.8* 12.8*  NEUTROABS 9.4* 11.1*  HGB 14.3 14.1  HCT 43.7 42.8  MCV 91.2 91.1  PLT 170 159   Cardiac Enzymes: No results for input(s): CKTOTAL, CKMB, CKMBINDEX, TROPONINI in the last 168 hours. BNP: BNP (last 3 results) No results for input(s): BNP in the last 8760  hours.  ProBNP (last 3 results) No results for input(s): PROBNP in the last 8760 hours.  CBG: No results for input(s): GLUCAP in the last 168 hours.     Signed:  Dia Crawford, MD [facility]

## 2021-05-13 NOTE — TOC Transition Note (Signed)
Transition of Care [facility]) - CM/SW Discharge Note   Patient Details  Name: Christopher Dawson MRN: [id number] Date of Birth: 07/19/1955  [facility] CM/SW Contact:  Lennart Pall, LCSW Phone Number: 05/13/2021, 10:00 AM   Clinical Narrative:    Pt medically cleared for dc today to [name].  Have received ins auth.  PTAR called at [phone].  RN to call report to [phone]. No further TOC needs.   Final next level of care: Skilled Nursing Facility Barriers to Discharge: Barriers Resolved   Patient Goals and CMS Choice Patient states their goals for this hospitalization and ongoing recovery are:: to only go to rehab for a short stay and then return home      Discharge Placement              Patient chooses bed at: [facility] Patient to be transferred to facility by: [place] Name of family member notified: pt to notify mother Patient and family notified of of transfer: 05/13/21  Discharge Plan and Services In-house Referral: Clinical Social Work   Post Acute Care Choice: [facility]          DME Arranged: N/A DME Agency: NA                  Social Determinants of Health (SDOH) Interventions     Readmission Risk Interventions No flowsheet data found.

## 2021-05-13 NOTE — Progress Notes (Signed)
Oncology Nurse Navigator Documentation  Oncology Nurse Navigator Flowsheets 05/13/2021 05/10/2021 05/06/2021 05/05/2021  Abnormal Finding Date - - 04/23/2021 -  Confirmed Diagnosis Date - - 04/28/2021 -  Diagnosis Status - - Pending Molecular Studies -  Planned Course of Treatment - - Radiation -  Phase of Treatment - - Radiation -  Radiation Actual Start Date: - - 04/26/2021 -  Navigator Follow Up Date: 05/17/2021 - 05/10/2021 05/09/2021  Navigator Follow Up Reason: New Patient Appointment - New Patient Appointment Appointment Review;Review Note  Navigator Location [facility]  Referral Date to RadOnc/MedOnc - - - 05/05/2021  Navigator Encounter Type Other: Other: Pathology Review Other:  Treatment Initiated Date - - 04/26/2021 -  Patient Visit Type Inpatient Inpatient Other -  Treatment Phase - - Other Other  Barriers/Navigation Needs Coordination of Care/I received a message that patient will be going to SNF today.  I scheduled patient to be seen next week and will be on discharge instructions.  Coordination of Care Coordination of Care Coordination of Care  Interventions Coordination of Care Coordination of Care Coordination of Care Coordination of Care  Acuity Level 2-Minimal Needs (1-2 Barriers Identified) Level 2-Minimal Needs (1-2 Barriers Identified) Level 2-Minimal Needs (1-2 Barriers Identified) Level 2-Minimal Needs (1-2 Barriers Identified)  Coordination of Care Appts Appts Pathology Other  Time Spent with Patient 30 30 30  30

## 2021-05-16 NOTE — Progress Notes (Unsigned)
[place] [phone]   [phone]  CONSULT NOTE  REFERRING PHYSICIAN: [facility]   REASON FOR CONSULTATION:  Lung Mass  HPI Christopher Dawson is a 66 y.o. male with a past medical history significant for Marfan syndrome, chronic back pain, depression, generalized anxiety disorder, and COPD is referred to the clinic for newly diagnosed metastatic lung cancer.  The patient presented to the emergency room on 04/23/2021.  The patient had worsening of his chronic lumbar back pain.  He noticed progressive the worse back pain over the last 6 months.  This is progressively gotten worse and he is having a more challenging time ambulating.  He also had associated loss of balance and falls due to lower extremity weakness.  A plain x-ray of the lumbar spine in the emergency room revealed questionable compression deformity at T12.  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  There is also 2 cm lytic lesion noted in the lateral aspect of T9.  A paravertebral soft tissue tumor was noted at T11/L1 with possible superimposed hematoma.   Did not appear to have acute cord compression syndrome as he appeared to be neurologically intact.  For pain management, the patient is currently taking oxycodone ***.  For the T12 destructive lesion, the patient is currently taking Decadron 4 mg 3 times daily, which was decreased to twice daily on 05/12/2020.  He is also taking gabapentin 100 mg 3 times daily.  The patient received 10 sessions of radiation to the T12 lesion the last treatment being on 05/10/2020.  He is followed by Dr. Tammi Klippel.  The patient first underwent a CT-guided biopsy on 1/3 of the left paraspinal lesion at level T12/L1 which showed inconclusive pathology.  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  Brain CT negative for any   Continued weakness can't. Rehab, [facility]. Therapy. Shake. No chills, or fever, no night sweats. Hearhting. No shortness of breath. No cough, cough  less.   Quit smoking when in to hospital. January, 1st. No chest pain or hemp. No nausea/vomiting, a lot of constipation, tremendous amount. Can controll,   Still can't do it. Trying but can't.   Pain back and lower to med back. Can't walk. Oxycodone 20 mg per day.   Family: Dad: lung cancer. Brother passed of lugn cancer. Mom.   Brother: poor [place] support.   Plant for 17 years then . Started smoking age 6. 1 ppd.. Alcohol 1-200 packs per year. No street drug use.   Repeats himself.   Foregetful.    Completed palliative radiation to the T12 lesion under the care of Dr. Tammi Klippel.   Dr. Julien Nordmann had a lengthly discussion with the patient today about her current condition and treatment options. The patient was given the option of a referral to hospice/palliative vs. Treatment with systemic chemotherapy with carboplatin for an AUC of 5, Alimta 500 mg/m, and Keytruda 200 mg IV every 3 weeks.  The patient is interested in proceeding with systemic chemotherapy.  She is expected to start her first dose of this treatment on __.  We discussed the adverse side effects of treatment including but not limited to alopecia, myelosuppression, nausea and vomiting, peripheral neuropathy, liver or renal dysfunction as well as immunotherapy mediated adverse effects.   I  will arrange for the patient to have a chemoeducation class prior to receiving her first cycle of chemotherapy.   We will arrange for the patient to have a B12 injection while in the clinic today.     I sent prescriptions for 1 mg folic acid p.o. daily as well as Compazine 10 mg every 6 hours as needed for nausea.   The patient will follow-up in 2 weeks for a one-week follow-up  visit after completing her first cycle of chemotherapy.       HPI  Past Medical History:  Diagnosis Date   Anxiety    Depression    Lumbar radiculopathy, chronic 2008   Marfan syndrome    Neurosis, depressive    Shortening, leg, congenital     Past Surgical History:  Procedure Laterality Date   LEG SURGERY      Family History  Problem Relation Age of Onset   Heart disease Mother     Social History Social History   Tobacco Use   Smoking status: Every Day    Types: Cigarettes   Smokeless tobacco: Never  Vaping Use   Vaping Use: Never used  Substance Use Topics   Alcohol use: Yes    Alcohol/week: 1.0 standard drink    Types: 1 Cans of beer per week    Comment: 3-4 beers daily   Drug use: No    Allergies  Allergen Reactions   Codeine Other (See Comments)    Made me feel "spaced out"    Current Outpatient Medications  Medication Sig Dispense Refill   acetaminophen (TYLENOL) 325 MG tablet Take 2 tablets (650 mg total) by mouth every 6 (six) hours as needed for mild pain (or Fever >/= 101). 30 tablet 0   bisacodyl (DULCOLAX) 5 MG EC tablet Take 1 tablet (5 mg total) by mouth daily. 30 tablet 0   clomiPRAMINE (ANAFRANIL) 25 MG capsule Take 4 capsules (100 mg total) by mouth at bedtime. 30 capsule 0   clomiPRAMINE (ANAFRANIL) 50 MG capsule TAKE 1 CAPSULE(50 MG) BY MOUTH AT BEDTIME (Patient taking differently: Take 100 mg by mouth at bedtime.) 30 capsule 1   feeding supplement (ENSURE ENLIVE / ENSURE PLUS) LIQD Take 237 mLs by mouth 3 (three) times daily between meals. 025 mL 12   folic acid (FOLVITE) 1 MG tablet Take 1 tablet (1 mg total) by mouth daily. 30 tablet 0   gabapentin (NEURONTIN) 100 MG capsule Take 1 capsule (100 mg total) by mouth 3 (three) times daily. 90 capsule 0   hydrOXYzine (ATARAX) 25 MG tablet Take 1 tablet (25 mg total) by mouth 3 (three) times daily as needed for anxiety. 30 tablet 0   lactulose (CHRONULAC) 10 GM/15ML solution Take 45 mLs  (30 g total) by mouth 2 (two) times daily. 236 mL 0   Multiple Vitamin (MULTIVITAMIN WITH MINERALS) TABS tablet Take 1 tablet by mouth daily. 30 tablet 0   nicotine (NICODERM CQ - DOSED IN MG/24 HOURS) 14 mg/24hr patch Place 1 patch (14 mg total) onto the skin daily. 28 patch 0   ondansetron (ZOFRAN) 4 MG tablet Take 1 tablet (4 mg total) by mouth every 6 (six) hours as needed for nausea. 20 tablet 0   oxyCODONE (OXY IR/ROXICODONE) 5 MG immediate release tablet Take 1-2 tablets (5-10 mg total) by mouth every 4 (four) hours as needed for moderate pain. 10 tablet 0   pantoprazole (PROTONIX) 40 MG tablet Take 1 tablet (40 mg total) by mouth daily. 30 tablet  0   senna-docusate (SENOKOT-S) 8.6-50 MG tablet Take 1 tablet by mouth 2 (two) times daily. 30 tablet 0   tamsulosin (FLOMAX) 0.4 MG CAPS capsule Take 1 capsule (0.4 mg total) by mouth daily after supper. 30 capsule 0   thiamine 100 MG tablet Take 1 tablet (100 mg total) by mouth daily. 30 tablet 0   No current facility-administered medications for this visit.    REVIEW OF SYSTEMS:   Review of Systems  Constitutional: Negative for appetite change, chills, fatigue, fever and unexpected weight change.  HENT:   Negative for mouth sores, nosebleeds, sore throat and trouble swallowing.   Eyes: Negative for eye problems and icterus.  Respiratory: Negative for cough, hemoptysis, shortness of breath and wheezing.   Cardiovascular: Negative for chest pain and leg swelling.  Gastrointestinal: Negative for abdominal pain, constipation, diarrhea, nausea and vomiting.  Genitourinary: Negative for bladder incontinence, difficulty urinating, dysuria, frequency and hematuria.   Musculoskeletal: Negative for back pain, gait problem, neck pain and neck stiffness.  Skin: Negative for itching and rash.  Neurological: Negative for dizziness, extremity weakness, gait problem, headaches, light-headedness and seizures.  Hematological: Negative for adenopathy. Does  not bruise/bleed easily.  Psychiatric/Behavioral: Negative for confusion, depression and sleep disturbance. The patient is not nervous/anxious.     PHYSICAL EXAMINATION:  There were no vitals taken for this visit.  ECOG PERFORMANCE STATUS: {CHL ONC ECOG [id number]  Physical Exam  Constitutional: Oriented to person, place, and time and well-developed, well-nourished, and in no distress. No distress.  HENT:  Head: Normocephalic and atraumatic.  Mouth/Throat: Oropharynx is clear and moist. No oropharyngeal exudate.  Eyes: Conjunctivae are normal. Right eye exhibits no discharge. Left eye exhibits no discharge. No scleral icterus.  Neck: Normal range of motion. Neck supple.  Cardiovascular: Normal rate, regular rhythm, normal heart sounds and intact distal pulses.   Pulmonary/Chest: Effort normal and breath sounds normal. No respiratory distress. No wheezes. No rales.  Abdominal: Soft. Bowel sounds are normal. Exhibits no distension and no mass. There is no tenderness.  Musculoskeletal: Normal range of motion. Exhibits no edema.  Lymphadenopathy:    No cervical adenopathy.  Neurological: Alert and oriented to person, place, and time. Exhibits normal muscle tone. Gait normal. Coordination normal.  Skin: Skin is warm and dry. No rash noted. Not diaphoretic. No erythema. No pallor.  Psychiatric: Mood, memory and judgment normal.  Vitals reviewed.  LABORATORY DATA: Lab Results  Component Value Date   WBC 12.8 (H) 05/13/2021   HGB 14.1 05/13/2021   HCT 42.8 05/13/2021   MCV 91.1 05/13/2021   PLT 159 05/13/2021      Chemistry      Component Value Date/Time   NA 134 (L) 05/13/2021 0249   NA 146 (H) 07/04/2018 1642   K 4.2 05/13/2021 0249   CL 103 05/13/2021 0249   CO2 24 05/13/2021 0249   BUN 31 (H) 05/13/2021 0249   BUN 13 07/04/2018 1642   CREATININE 0.44 (L) 05/13/2021 0249   CREATININE 0.95 07/04/2012 1534      Component Value Date/Time   CALCIUM 8.3 (L) 05/13/2021  0249   ALKPHOS 35 (L) 05/13/2021 0249   AST 13 (L) 05/13/2021 0249   ALT 27 05/13/2021 0249   BILITOT 0.4 05/13/2021 0249   BILITOT <0.2 07/04/2018 1642       RADIOGRAPHIC STUDIES: DG Lumbar Spine Complete  Result Date: 04/23/2021 CLINICAL DATA:  Back pain. EXAM: LUMBAR SPINE - COMPLETE 4+ VIEW COMPARISON:  None. FINDINGS: There is  dextroconvex curvature of the lumbar spine. There is no evidence for acute fracture or dislocation of the lumbar spine. There is questionable mild compression deformity of T12, age indeterminate. Mild degenerative endplate osteophytes are seen throughout the lumbar spine. Disc spaces are grossly maintained. Soft tissues are within normal limits. IMPRESSION: 1. Questionable mild compression deformity of T12, age indeterminate. Correlate for point tenderness. Consider dedicated imaging of this level. 2. Levoconvex curvature of the lumbar spine. 3. Mild degenerative changes. Electronically Signed   By: Ronney Asters M.D.   On: 04/23/2021 16:55   CT HEAD W & WO CONTRAST (5MM)  Result Date: 05/04/2021 CLINICAL DATA:  Staging of non-small cell lung cancer. EXAM: CT HEAD WITHOUT AND WITH CONTRAST TECHNIQUE: Contiguous axial images were obtained from the base of the skull through the vertex without and with intravenous contrast. RADIATION DOSE REDUCTION: This exam was performed according to the departmental dose-optimization program which includes automated exposure control, adjustment of the mA and/or kV according to patient size and/or use of iterative reconstruction technique. CONTRAST:  47mL OMNIPAQUE IOHEXOL 350 MG/ML SOLN COMPARISON:  05/09/2017 FINDINGS: Brain: Mild generalized volume loss. Mild chronic small-vessel ischemic change of the hemispheric white matter. No sign of recent infarction, mass lesion, hemorrhage, hydrocephalus or extra-axial collection. After contrast administration, no abnormal enhancement occurs. Vascular: There is atherosclerotic calcification of  the major vessels at the base of the brain. Skull: Negative Sinuses/Orbits: Clear/normal Other: None IMPRESSION: No evidence of metastatic disease. Mild generalized volume loss and chronic small-vessel ischemic change of the cerebral hemispheric white matter. Electronically Signed   By: Nelson Chimes M.D.   On: 05/04/2021 10:29   CT Thoracic Spine Wo Contrast  Result Date: 04/23/2021 CLINICAL DATA:  Worsening chronic back pain. EXAM: CT THORACIC AND LUMBAR SPINE WITHOUT CONTRAST TECHNIQUE: Multidetector CT imaging of the thoracic and lumbar spine was performed without contrast. Multiplanar CT image reconstructions were also generated. COMPARISON:  Lumbar spine radiographs 04/23/2021 FINDINGS: CT THORACIC SPINE FINDINGS Alignment: Exaggerated upper thoracic kyphosis.  No listhesis. Vertebrae: Permeative lytic destructive lesion of the vertebral body and left-sided posterior elements at T12 with associated pathologic fracture and mild vertebral body height loss. 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 2 cm lytic lesion in the left lateral aspect of the T9 vertebral body with extraosseous tumor extension. Paraspinal and other soft tissues: Lower thoracic paravertebral soft tissue suggestive of tumor, possibly with superimposed hematoma, extending from T11-L1. Centrilobular and paraseptal emphysema. Disc levels: Mild-to-moderate thoracic spondylosis. At least mild spinal stenosis is suspected at T12 due to epidural tumor. Tumor also encroaches on the left-sided neural foramina at T11-12 and T12-L1. CT LUMBAR SPINE FINDINGS Segmentation: 5 lumbar type vertebrae. Alignment: Trace retrolisthesis of L4 on L5 and L5 on S1. Slight right convex curvature of the upper lumbar spine. Vertebrae: No acute fracture or suspicious osseous lesion. Paraspinal and other soft tissues: Mild abdominal aortic atherosclerosis. Disc levels: Mild disc degeneration, greatest at L4-5 where disc  bulging, spurring, and mild facet hypertrophy result in borderline spinal stenosis and mild right lateral recess and right neural foraminal stenosis. IMPRESSION: 1. Destructive lesion involving the T12 vertebral body and left-sided posterior elements with pathologic vertebral body fracture and epidural tumor resulting in at least mild spinal stenosis. Smaller lesion in the T9 vertebral body. Findings are most concerning for metastatic disease. 2. Aortic Atherosclerosis (ICD10-I70.0) and Emphysema (ICD10-J43.9). Electronically Signed   By: Logan Bores M.D.   On: 04/23/2021 18:41   CT  Lumbar Spine Wo Contrast  Result Date: 04/23/2021 CLINICAL DATA:  Worsening chronic back pain. EXAM: CT THORACIC AND LUMBAR SPINE WITHOUT CONTRAST TECHNIQUE: Multidetector CT imaging of the thoracic and lumbar spine was performed without contrast. Multiplanar CT image reconstructions were also generated. COMPARISON:  Lumbar spine radiographs 04/23/2021 FINDINGS: CT THORACIC SPINE FINDINGS Alignment: Exaggerated upper thoracic kyphosis.  No listhesis. Vertebrae: Permeative lytic destructive lesion of the vertebral body and left-sided posterior elements at T12 with associated pathologic fracture and mild vertebral body height loss. 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 2 cm lytic lesion in the left lateral aspect of the T9 vertebral body with extraosseous tumor extension. Paraspinal and other soft tissues: Lower thoracic paravertebral soft tissue suggestive of tumor, possibly with superimposed hematoma, extending from T11-L1. Centrilobular and paraseptal emphysema. Disc levels: Mild-to-moderate thoracic spondylosis. At least mild spinal stenosis is suspected at T12 due to epidural tumor. Tumor also encroaches on the left-sided neural foramina at T11-12 and T12-L1. CT LUMBAR SPINE FINDINGS Segmentation: 5 lumbar type vertebrae. Alignment: Trace retrolisthesis of L4 on L5 and L5 on S1.  Slight right convex curvature of the upper lumbar spine. Vertebrae: No acute fracture or suspicious osseous lesion. Paraspinal and other soft tissues: Mild abdominal aortic atherosclerosis. Disc levels: Mild disc degeneration, greatest at L4-5 where disc bulging, spurring, and mild facet hypertrophy result in borderline spinal stenosis and mild right lateral recess and right neural foraminal stenosis. IMPRESSION: 1. Destructive lesion involving the T12 vertebral body and left-sided posterior elements with pathologic vertebral body fracture and epidural tumor resulting in at least mild spinal stenosis. Smaller lesion in the T9 vertebral body. Findings are most concerning for metastatic disease. 2. Aortic Atherosclerosis (ICD10-I70.0) and Emphysema (ICD10-J43.9). Electronically Signed   By: Logan Bores M.D.   On: 04/23/2021 18:41   MR THORACIC SPINE W WO CONTRAST  Result Date: 04/24/2021 CLINICAL DATA:  Back pain.  Metastatic disease. EXAM: MRI THORACIC AND LUMBAR SPINE WITHOUT AND WITH CONTRAST TECHNIQUE: Multiplanar and multiecho pulse sequences of the thoracic and lumbar spine were obtained without and with intravenous contrast. CONTRAST:  47mL GADAVIST GADOBUTROL 1 MMOL/ML IV SOLN COMPARISON:  CT thoracic and lumbar spine 04/23/2021 FINDINGS: MRI THORACIC SPINE FINDINGS Alignment: Exaggerated upper thoracic kyphosis. No significant listhesis. Vertebrae: Abnormal T1 hypointensity, T2/STIR hyperintensity, and enhancement throughout the T9 vertebral body including a more focal 2 cm lesion in the left aspect of the vertebral body corresponding to the lytic lesion on CT. 2 cm lesion in the posterior aspect of the T5 vertebral body. Subcentimeter enhancing lesion in the right T11 pars. 1 cm enhancing lesion in the base of the T10 spinous process. Subcentimeter enhancing lesion versus vascular enhancement in the left T10 pedicle. Destructive lesion involving the T12 vertebral body and left-sided posterior elements  with pathologic fracture as described on CT with extraosseous tumor encroaching upon the left T11-12 and T12-L1 neural foramina and with ventral and left-sided epidural tumor resulting in mild spinal stenosis without cord compression. Mild edema and enhancement along the endplates at Y1-8, likely degenerative. Cord:  Normal cord signal. 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 Disc levels: Mild spinal stenosis at T12 due to epidural tumor. Mild thoracic spondylosis elsewhere without significant degenerative stenosis. MRI LUMBAR SPINE FINDINGS Segmentation:  Standard. Alignment: Slight right convex curvature of the lumbar spine. Trace retrolisthesis of L4 on L5 and L5 on S1. Vertebrae: Enhancing lesions in the L1 vertebral body and spinous process without evidence of  associated epidural tumor subcentimeter enhancing lesion in the right L5 pedicle. No fracture. Conus medullaris: Extends to the L1-2 level and appears normal. Paraspinal and other soft tissues: Paraspinal tumor at T12 as described above extending to the L1 level. Disc levels: L1-2: Disc desiccation. Mild disc bulging and mild facet hypertrophy without stenosis. L2-3: Minimal disc bulging and mild-to-moderate facet hypertrophy without stenosis. L3-4: Disc desiccation. Disc bulging, a left foraminal disc osteophyte complex, and mild to moderate facet hypertrophy without significant stenosis. L4-5: Disc desiccation and mild-to-moderate disc space narrowing. Disc bulging, a small central disc protrusion, and mild-to-moderate facet and ligamentum flavum hypertrophy result in borderline to mild bilateral lateral recess and right neural foraminal stenosis without spinal stenosis. L5-S1: Disc desiccation and mild-to-moderate disc space narrowing. Disc bulging, a small left central disc protrusion, and mild facet hypertrophy result in borderline to mild left lateral recess stenosis without spinal or neural foraminal stenosis. IMPRESSION: 1.  Multiple enhancing thoracic and lumbar spine lesions consistent with metastases. 2. 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 Electronically Signed   By: Logan Bores M.D.   On: 04/24/2021 13:58   MR Lumbar Spine W Wo Contrast  Result Date: 04/24/2021 CLINICAL DATA:  Back pain.  Metastatic disease. EXAM: MRI THORACIC AND LUMBAR SPINE WITHOUT AND WITH CONTRAST TECHNIQUE: Multiplanar and multiecho pulse sequences of the thoracic and lumbar spine were obtained without and with intravenous contrast. CONTRAST:  53mL GADAVIST GADOBUTROL 1 MMOL/ML IV SOLN COMPARISON:  CT thoracic and lumbar spine 04/23/2021 FINDINGS: MRI THORACIC SPINE FINDINGS Alignment: Exaggerated upper thoracic kyphosis. No significant listhesis. Vertebrae: Abnormal T1 hypointensity, T2/STIR hyperintensity, and enhancement throughout the T9 vertebral body including a more focal 2 cm lesion in the left aspect of the vertebral body corresponding to the lytic lesion on CT. 2 cm lesion in the posterior aspect of the T5 vertebral body. Subcentimeter enhancing lesion in the right T11 pars. 1 cm enhancing lesion in the base of the T10 spinous process. Subcentimeter enhancing lesion versus vascular enhancement in the left T10 pedicle. Destructive lesion involving the T12 vertebral body and left-sided posterior elements with pathologic fracture as described on CT with extraosseous tumor encroaching upon the left T11-12 and T12-L1 neural foramina and with ventral and left-sided epidural tumor resulting in mild spinal stenosis without cord compression. Mild edema and enhancement along the endplates at T2-4, likely degenerative. Cord:  Normal cord signal. 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 Disc levels: Mild spinal stenosis at T12 due to epidural tumor. Mild thoracic spondylosis elsewhere without significant  degenerative stenosis. MRI LUMBAR SPINE FINDINGS Segmentation:  Standard. Alignment: Slight right convex curvature of the lumbar spine. Trace retrolisthesis of L4 on L5 and L5 on S1. Vertebrae: Enhancing lesions in the L1 vertebral body and spinous process without evidence of associated epidural tumor subcentimeter enhancing lesion in the right L5 pedicle. No fracture. Conus medullaris: Extends to the L1-2 level and appears normal. Paraspinal and other soft tissues: Paraspinal tumor at T12 as described above extending to the L1 level. Disc levels: L1-2: Disc desiccation. Mild disc bulging and mild facet hypertrophy without stenosis. L2-3: Minimal disc bulging and mild-to-moderate facet hypertrophy without stenosis. L3-4: Disc desiccation. Disc bulging, a left foraminal disc osteophyte complex, and mild to moderate facet hypertrophy without significant stenosis. L4-5: Disc desiccation and mild-to-moderate disc space narrowing. Disc bulging, a small central disc protrusion, and mild-to-moderate facet and ligamentum flavum hypertrophy  result in borderline to mild bilateral lateral recess and right neural foraminal stenosis without spinal stenosis. L5-S1: Disc desiccation and mild-to-moderate disc space narrowing. Disc bulging, a small left central disc protrusion, and mild facet hypertrophy result in borderline to mild left lateral recess stenosis without spinal or neural foraminal stenosis. IMPRESSION: 1. Multiple enhancing thoracic and lumbar spine lesions consistent with metastases. 2. 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 Electronically Signed   By: Logan Bores M.D.   On: 04/24/2021 13:58   CT CHEST ABDOMEN PELVIS W CONTRAST  Result Date: 04/23/2021 CLINICAL DATA:  Permeative lytic destructive lesion of the T12 vertebral body and left-sided posterior elements with pathologic fracture  and mild vertebral height loss, worrisome for metastatic disease. Evaluate for primary. 2 cm lytic lesion left lateral T9 body. EXAM: CT CHEST, ABDOMEN, AND PELVIS WITH CONTRAST TECHNIQUE: Multidetector CT imaging of the chest, abdomen and pelvis was performed following the standard protocol during bolus administration of intravenous contrast. CONTRAST:  55mL OMNIPAQUE IOHEXOL 350 MG/ML SOLN COMPARISON:  CT thoracic and lumbar spine earlier today. FINDINGS: CT CHEST FINDINGS Cardiovascular: The cardiac size is normal. There are scattered calcifications in the LAD coronary artery. Early changes of aortic atherosclerosis. 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 There is no arteriovenous dilatation or central arterial embolus. No pericardial effusion. Mediastinum/Nodes: There are slightly prominent left hilar lymph nodes with a lymph node measuring 1.2 cm in short axis in the anterior left hilum on series 2 axial 36 and another more posteriorly measuring 1 cm in short axis on axial 41. There is no bulky, encasing or further intrathoracic adenopathy, no mediastinal or axillary mass or thyroid lesions. There is no tracheal or central airway filling defect or significant esophageal thickening. Minimally small hiatal hernia. Lungs/Pleura: No pleural effusion, thickening or pneumothorax. There is a spiculated mass in the left upper lobe laterally at mid field measuring 1.8 x 1.6 x 1.0 cm, with pleural stranding lateral to it. 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 Bilateral apical pleuroparenchymal scar-like opacities are noted, with bilateral paraseptal emphysematous changes in the lungs predominating in the upper lobes, relatively milder centrilobular changes lung apices. There are patchy hazy opacities in the lingula anteriorly which could be chronic change or pneumonitis. There are scattered linear scar-like opacities in  the bases. No other nodules or infiltrates are observed. 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 There is left paraspinal tumor extension at the level of the vertebral body as well. A 2 cm T9 vertebral body lytic lesion is again noted to the left anteriorly, with adjacent paraspinal soft tissue thickening without intracanalicular extension. No other focal bone lesions are seen in the thoracic osseous structures. Asymmetric elevation right hemidiaphragm is also seen. CT ABDOMEN PELVIS FINDINGS Hepatobiliary: Slightly steatotic liver without mass enhancement. The gallbladder and bile ducts unremarkable. Pancreas: Moderately fatty atrophic without mass enhancement or ductal dilatation. Spleen: Unremarkable. Adrenals/Urinary Tract: Indeterminate 1.5 cm heterogeneous right adrenal nodule. There is no left adrenal mass , no focal renal cortical abnormality. There are either are few small scattered nonobstructive caliceal stones in the kidneys or early contrast excretion  mimicking stones. There is no obstructive stone or hydroureteronephrosis no bladder thickening. Stomach/Bowel: The gastric wall is contracted. The unopacified small bowel demonstrating fold thickening in the duodenum and jejunum with suspected fold thickening in the underdistended proximal stomach. There is no small bowel obstruction or inflammation. The appendix is normal. Moderate constipation is seen. Left colonic diverticula without diverticulitis. There is moderate wall thickening versus nondistention in the rectum. Vascular/Lymphatic: Aortic atherosclerosis. No enlarged abdominal or pelvic lymph nodes. Reproductive: Enlarged prostate, 5.5 cm transverse with bladder base impression.  Other: There is no free air, hemorrhage or fluid. Small umbilical fat hernia. Musculoskeletal: No regional destructive bone lesions. Moderate left-greater-than-right hip DJD. Degenerative changes and mild dextroscoliosis lumbar spine. IMPRESSION: 1. 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 There are 2 slightly prominent left hilar lymph nodes but no further chest adenopathy. 2. 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 3. Patchy haziness in the anterior lingula which could be chronic change or pneumonitis. 4. Indeterminate heterogeneous 1.5 cm right adrenal mass, possible metastasis. PET-CT may be helpful. 5. COPD and scarring change. 6. 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 No AAA. 7. Moderate thickening of the rectum versus underdistention. Further evaluation recommended. 8. Enlarged prostate. 9. Constipation, and additional findings suggesting gastroenteritis. No small bowel obstruction or inflammatory changes. 10. No other CT evidence of metastatic disease. Additional findings discussed above. Electronically Signed   By: Telford Nab M.D.   On: 04/23/2021 21:03   CT BIOPSY  Result Date: 04/28/2021 Criselda Peaches, MD     04/28/2021  4:22 PM Interventional Radiology Procedure Note Procedure: Repeat CT bx of T12 metastatic lesion Complications: None Estimated Blood Loss: None Recommendations: - Return to floor - Path sent Signed, Criselda Peaches, MD   CT BIOPSY  Result Date: 04/26/2021 INDICATION: 66 year old with left lung lesion and bone lesions. Findings are concerning for metastatic disease  and needs a tissue diagnosis. EXAM: CT-GUIDED BIOPSY OF PARASPINAL LESION Physician: Stephan Minister. Henn, MD MEDICATIONS: Moderate sedation ANESTHESIA/SEDATION: Moderate (conscious) sedation was employed during this procedure. A total of Versed 2.0mg  and fentanyl 100 mcg was administered intravenously at the order of the provider performing the procedure. Total intra-service moderate sedation time: 28 minutes. Patient's level of consciousness and vital signs were monitored continuously by radiology nurse throughout the procedure under the supervision of the provider performing the procedure. COMPLICATIONS: None immediate. PROCEDURE: The procedure was explained to the patient. The risks and benefits of the procedure were discussed and the patient's questions were addressed. Informed consent was obtained from the patient. The patient was placed prone on CT table. Images at the thoracolumbar spine were obtained. The left paraspinal soft tissue at T12-L1 was identified and targeted. The overlying skin was prepped with chlorhexidine and sterile field was created. Skin was anesthetized using 1% lidocaine. Small incision was made. Using CT guidance, 17 gauge coaxial needle was directed along the lateral aspect of the left paraspinal tissue. A single core biopsy obtained with an 18 gauge device. Specimen placed in formalin. There was brisk bleeding from the coaxial needle following the single core biopsy. Stylet was replaced within the coaxial needle and checked for bleeding periodically by  removing the stylet. There continued to be some bleeding after a few minutes. Needle was slowly withdrawn until the bleeding appeared to stop. There was concern for a small hematoma developing under the skin. As a result, the needle was completely removed and manual compression was placed for approximately 5 minutes. Follow up CT images were obtained. Bandage placed over the puncture site. FINDINGS: Destructive lesion involving the T12  vertebral body with abnormal soft tissue along the left paraspinal region at T12-L1. The biopsy needle was placed along the lateral aspect of this abnormal paraspinal tissue. Single core biopsy was obtained. Brisk bleeding from the 17 gauge coaxial needle following the core biopsy. Due to the bleeding and concern for a small soft tissue hematoma, no additional core biopsies were obtained. IMPRESSION: CT-guided core biopsy of the left paraspinal lesion at the level of T12-L1. Electronically Signed   By: Markus Daft M.D.   On: 04/26/2021 14:44   DG Abd Portable 1V  Result Date: 05/09/2021 CLINICAL DATA:  Constipation EXAM: PORTABLE ABDOMEN - 1 VIEW COMPARISON:  None. FINDINGS: Supine frontal view of the abdomen and pelvis excludes the right flank and bilateral hemidiaphragms by collimation. No bowel obstruction or ileus. Mild retained stool within the colon. No masses or abnormal calcifications. Bilateral hip osteoarthritis, left greater than right. IMPRESSION: 1. Mild fecal retention.  No bowel obstruction. Electronically Signed   By: Randa Ngo M.D.   On: 05/09/2021 16:19    ASSESSMENT:   Completed palliative radiation to the T12 lesion under the care of Dr. Tammi Klippel.   Dr. Julien Nordmann had a lengthly discussion with the patient today about her current condition and treatment options. The patient was given the option of a referral to hospice/palliative vs. Treatment with systemic chemotherapy with carboplatin for an AUC of 5, Alimta 500 mg/m, and Keytruda 200 mg IV every 3 weeks.  The patient is interested in proceeding with systemic chemotherapy.  She is expected to start her first dose of this treatment on __.  We discussed the adverse side effects of treatment including but not limited to alopecia, myelosuppression, nausea and vomiting, peripheral neuropathy, liver or renal dysfunction as well as immunotherapy mediated adverse effects.   I will arrange for the patient to have a chemoeducation class  prior to receiving her first cycle of chemotherapy.   We will arrange for the patient to have a B12 injection while in the clinic today.     I sent prescriptions for 1 mg folic acid p.o. daily as well as Compazine 10 mg every 6 hours as needed for nausea.   The patient will follow-up in 2 weeks for a one-week follow-up visit after completing her first cycle of chemotherapy.  The patient voices understanding of current disease status and treatment options and is in agreement with the current care plan.  All questions were answered. The patient knows to call the clinic with any problems, questions or concerns. We can certainly see the patient much sooner if necessary.  Thank you so much for allowing me to participate in the care of Christopher Dawson. I will continue to follow up the patient with you and assist in his care.  I spent {CHL ONC TIME VISIT - VHQIO:[id number]} counseling the patient face to face. The total time spent in the appointment was {CHL ONC TIME VISIT - KGMWN:[id number]}.  Disclaimer: This note was dictated with voice recognition software. Similar sounding words can inadvertently be transcribed and may not be corrected upon review.   Ceil Roderick L  Chasitie Passey May 16, 2021, 2:24 PM

## 2021-05-17 ENCOUNTER — Encounter: Payer: Self-pay | Admitting: *Deleted

## 2021-05-17 ENCOUNTER — Encounter (HOSPITAL_COMMUNITY): Payer: Self-pay | Admitting: Oncology

## 2021-05-17 ENCOUNTER — Inpatient Hospital Stay: Payer: Medicare Other | Attending: Physician Assistant | Admitting: Physician Assistant

## 2021-05-17 NOTE — Progress Notes (Signed)
Oncology Nurse Navigator Documentation  Oncology Nurse Navigator Flowsheets 05/17/2021 05/13/2021 05/10/2021 05/06/2021 05/05/2021  Abnormal Finding Date - - - 04/23/2021 -  Confirmed Diagnosis Date - - - 04/28/2021 -  Diagnosis Status - - - Pending Molecular Studies -  Planned Course of Treatment - - - Radiation -  Phase of Treatment - - - Radiation -  Radiation Actual Start Date: - - - 04/26/2021 -  Navigator Follow Up Date: 05/23/2021 05/17/2021 - 05/10/2021 05/09/2021  Navigator Follow Up Reason: Appointment Review New Patient Appointment - New Patient Appointment Appointment Review;Review Note  Navigator Location [facility]  Referral Date to RadOnc/MedOnc - - - - 05/05/2021  Navigator Encounter Type Other:/I called [facility] and spoke to their transportation service to re-schedule his appts to be seen with medical oncology. She verbalized understanding of appt.  Other: Other: Pathology Review Other:  Treatment Initiated Date - - - 04/26/2021 -  Patient Visit Type Other Inpatient Inpatient Other -  Treatment Phase - - - Other Other  Barriers/Navigation Needs Coordination of Care Coordination of Care Coordination of Care Coordination of Care Coordination of Care  Interventions Coordination of Care Coordination of Care Coordination of Care Coordination of Care Coordination of Care  Acuity Level 2-Minimal Needs (1-2 Barriers Identified) Level 2-Minimal Needs (1-2 Barriers Identified) Level 2-Minimal Needs (1-2 Barriers Identified) Level 2-Minimal Needs (1-2 Barriers Identified) Level 2-Minimal Needs (1-2 Barriers Identified)  Coordination of Care - Appts Appts Pathology Other  Time Spent with Patient - 30 30 30  30

## 2021-05-18 ENCOUNTER — Telehealth: Payer: Self-pay | Admitting: Medical Oncology

## 2021-05-18 NOTE — Telephone Encounter (Signed)
Pt left a VM asking when his new appt is scheduled and he needs transportation.Navigator note indicates that appt is scheduled [name] notified for transportation.  I tried several times to contact pt ,but his VM is not set up.

## 2021-05-19 ENCOUNTER — Encounter: Payer: Self-pay | Admitting: Urology

## 2021-05-20 ENCOUNTER — Encounter (HOSPITAL_COMMUNITY): Payer: Self-pay | Admitting: Oncology

## 2021-05-20 ENCOUNTER — Other Ambulatory Visit: Payer: Self-pay | Admitting: Family Medicine

## 2021-05-20 DIAGNOSIS — F332 Major depressive disorder, recurrent severe without psychotic features: Secondary | ICD-10-CM

## 2021-05-20 MED ORDER — CLOMIPRAMINE HCL 25 MG PO CAPS: 100.0000 mg | ORAL_CAPSULE | Freq: Every day | ORAL | 2 refills | Status: DC

## 2021-05-30 ENCOUNTER — Ambulatory Visit: Payer: Medicare Other | Admitting: Podiatry

## 2021-06-01 ENCOUNTER — Encounter: Payer: Self-pay | Admitting: *Deleted

## 2021-06-01 NOTE — Progress Notes (Signed)
I followed up on molecular test results. They are completed. I will print them and give them to [facility] PA-C

## 2021-06-01 NOTE — Progress Notes (Signed)
Oncology Nurse Navigator Documentation  Oncology Nurse Navigator Flowsheets 06/01/2021 05/17/2021 05/13/2021 05/10/2021 05/06/2021 05/05/2021  Abnormal Finding Date - - - - 04/23/2021 -  Confirmed Diagnosis Date - - - - 04/28/2021 -  Diagnosis Status - - - - Pending Molecular Studies -  Planned Course of Treatment - - - - Radiation -  Phase of Treatment - - - - Radiation -  Radiation Actual Start Date: - - - - 04/26/2021 -  Navigator Follow Up Date: 06/02/2021 05/23/2021 05/17/2021 - 05/10/2021 05/09/2021  Navigator Follow Up Reason: New Patient Appointment Appointment Review New Patient Appointment - New Patient Appointment Appointment Review;Review Note  Navigator Location [facility]  Referral Date to RadOnc/MedOnc - - - - - 05/05/2021  Navigator Encounter Type Telephone Other: Other: Other: Pathology Review Other:  Telephone Outgoing Call - - - - -  Treatment Initiated Date - - - - 04/26/2021 -  Patient Visit Type Other Other Inpatient Inpatient Other -  Treatment Phase Pre-Tx/Tx Discussion - - - Other Other  Barriers/Navigation Needs Education;Coordination of Care Coordination of Care Coordination of Care Coordination of Care Coordination of Care Coordination of Care  Education Other - - - - -  Interventions Coordination of Care;Education/I called Mr. Krisher facility. I spoke to [place] their transportation service to remind her about appt. She verbalized understanding of appt time and place.  Coordination of Care Coordination of Care Coordination of Care Coordination of Care Coordination of Care  Acuity Level 2-Minimal Needs (1-2 Barriers Identified) Level 2-Minimal Needs (1-2 Barriers Identified) Level 2-Minimal Needs (1-2 Barriers Identified) Level 2-Minimal Needs (1-2 Barriers Identified) Level 2-Minimal Needs (1-2 Barriers Identified) Level 2-Minimal Needs (1-2 Barriers Identified)  Coordination of Care Other -  Appts Appts Pathology Other  Education Method Verbal - - - - -  Time Spent with Patient 30 - 30 30 30  30

## 2021-06-02 ENCOUNTER — Encounter: Payer: Self-pay | Admitting: *Deleted

## 2021-06-02 ENCOUNTER — Other Ambulatory Visit: Payer: Self-pay

## 2021-06-02 ENCOUNTER — Inpatient Hospital Stay: Payer: Medicare Other

## 2021-06-02 ENCOUNTER — Inpatient Hospital Stay: Payer: Medicare Other | Attending: Physician Assistant | Admitting: Physician Assistant

## 2021-06-02 ENCOUNTER — Encounter: Payer: Self-pay | Admitting: Internal Medicine

## 2021-06-02 VITALS — BP 110/76 | HR 116 | Temp 98.5°F | Resp 18 | Ht 76.0 in

## 2021-06-02 DIAGNOSIS — G893 Neoplasm related pain (acute) (chronic): Secondary | ICD-10-CM

## 2021-06-02 DIAGNOSIS — C7951 Secondary malignant neoplasm of bone: Secondary | ICD-10-CM | POA: Diagnosis not present

## 2021-06-02 DIAGNOSIS — R5383 Other fatigue: Secondary | ICD-10-CM | POA: Diagnosis not present

## 2021-06-02 DIAGNOSIS — F17210 Nicotine dependence, cigarettes, uncomplicated: Secondary | ICD-10-CM | POA: Diagnosis not present

## 2021-06-02 DIAGNOSIS — E538 Deficiency of other specified B group vitamins: Secondary | ICD-10-CM | POA: Diagnosis present

## 2021-06-02 DIAGNOSIS — Q8740 Marfan's syndrome, unspecified: Secondary | ICD-10-CM

## 2021-06-02 DIAGNOSIS — C3490 Malignant neoplasm of unspecified part of unspecified bronchus or lung: Secondary | ICD-10-CM | POA: Diagnosis not present

## 2021-06-02 DIAGNOSIS — C3412 Malignant neoplasm of upper lobe, left bronchus or lung: Secondary | ICD-10-CM | POA: Diagnosis not present

## 2021-06-02 DIAGNOSIS — Z923 Personal history of irradiation: Secondary | ICD-10-CM | POA: Insufficient documentation

## 2021-06-02 LAB — CMP (CANCER CENTER ONLY)
ALT: 53 U/L — ABNORMAL HIGH (ref 0–44)
AST: 53 U/L — ABNORMAL HIGH (ref 15–41)
Albumin: 2.8 g/dL — ABNORMAL LOW (ref 3.5–5.0)
Alkaline Phosphatase: 105 U/L (ref 38–126)
Anion gap: 6 (ref 5–15)
BUN: 17 mg/dL (ref 8–23)
CO2: 31 mmol/L (ref 22–32)
Calcium: 8.5 mg/dL — ABNORMAL LOW (ref 8.9–10.3)
Chloride: 101 mmol/L (ref 98–111)
Creatinine: 0.61 mg/dL (ref 0.61–1.24)
GFR, Estimated: >60 mL/min (ref >60)
Glucose, Bld: 98 mg/dL (ref 70–99)
Potassium: 3.5 mmol/L (ref 3.5–5.1)
Sodium: 138 mmol/L (ref 135–145)
Total Bilirubin: 0.4 mg/dL (ref 0.3–1.2)
Total Protein: 6 g/dL — ABNORMAL LOW (ref 6.5–8.1)

## 2021-06-02 LAB — CBC WITH DIFFERENTIAL (CANCER CENTER ONLY)
Abs Immature Granulocytes: 0.2 10*3/uL — ABNORMAL HIGH (ref 0.00–0.07)
Basophils Absolute: 0.1 10*3/uL (ref 0.0–0.1)
Basophils Relative: 1 %
Eosinophils Absolute: 0.1 10*3/uL (ref 0.0–0.5)
Eosinophils Relative: 1 %
HCT: 30.5 % — ABNORMAL LOW (ref 39.0–52.0)
Hemoglobin: 10.2 g/dL — ABNORMAL LOW (ref 13.0–17.0)
Immature Granulocytes: 3 %
Lymphocytes Relative: 6 %
Lymphs Abs: 0.5 10*3/uL — ABNORMAL LOW (ref 0.7–4.0)
MCH: 29.4 pg (ref 26.0–34.0)
MCHC: 33.4 g/dL (ref 30.0–36.0)
MCV: 87.9 fL (ref 80.0–100.0)
Monocytes Absolute: 1.2 10*3/uL — ABNORMAL HIGH (ref 0.1–1.0)
Monocytes Relative: 15 %
Neutro Abs: 6 10*3/uL (ref 1.7–7.7)
Neutrophils Relative %: 74 %
Platelet Count: 526 10*3/uL — ABNORMAL HIGH (ref 150–400)
RBC: 3.47 MIL/uL — ABNORMAL LOW (ref 4.22–5.81)
RDW: 14.2 % (ref 11.5–15.5)
WBC Count: 8 10*3/uL (ref 4.0–10.5)
nRBC: 0 % (ref 0.0–0.2)

## 2021-06-02 NOTE — Patient Instructions (Signed)

## 2021-06-02 NOTE — Progress Notes (Signed)
[place] Telephone:([phone]) [phone]   [phone]  CONSULT NOTE  REFERRING PHYSICIAN: [facility]   REASON FOR CONSULTATION:  Lung Mass  HPI Christopher Dawson is a 66 y.o. male with a past medical history significant for Marfan syndrome, chronic back pain, depression, generalized anxiety disorder, and COPD is referred to the clinic for newly diagnosed metastatic lung cancer.  The patient presented to the emergency room on 04/23/2021.  The patient had worsening of his chronic lumbar back pain over the past 6 months. This is progressively gotten worse and he is having a more challenging time ambulating. He also had associated loss of balance and falls due to lower extremity weakness.  A plain x-ray of the lumbar spine in the emergency room revealed questionable compression deformity at T12.  He had CT scan of the thoracic and lumbar spine as well as MR thoracic and lumbar spine which showed Multiple enhancing thoracic and lumbar spine lesions consistent with metastases. There is also a destructive lesion involving the T12 vertebral body and left-sided posterior elements with pathologic fracture and epidural tumor resulting in mild spinal stenosis.  The patient had a CT of the chest, abdomen, pelvis on 04/23/2021 which showed a 1.8x1.6 x1.0 centimeter left upper lobe spiculated nodule with pleural stranding consistent with primary neoplasm unless proven otherwise.  There was 2 slightly prominent left hilar lymph nodes.  The scan showed a 2 cm lytic lesion on T9 with extra vertebral extension, with lytic destructive lesion in the T12 vertebral body and left pedicle with intracanalicular epidural extension partially effacing the left hemicanal and ventral CSF. there is an indeterminate 1.5 cm right adrenal mass.  He did not appear to have acute cord compression syndrome as he appeared to be neurologically intact.  The patient received 10 sessions of radiation to the T12  lesion. The  last treatment being on 05/10/2020.  This was performed by Dr. Tammi Klippel. He is scheduled for a 1 month follow up in 2 weeks.   The patient first underwent a CT-guided biopsy on 1/3 of the left paraspinal lesion at level T12/L1 which showed inconclusive pathology.  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 The patient is positive for KRAS G12C mutation. He had a CT of the head with and without contrast while admitted to the hospital which was negative for metastatic disease to the brain.   The patient presently resides in a skilled nursing facility.  The patient is on a stretcher at his appointment today.  The patient states that he still is unable to bear weight, walk, or get himself out of bed.  The history is somewhat limited due to the patient being a poor historian.  It sounds like the patient is presently not performing physical therapy as he was unable to do it.   Patient denies any recent fever, chills, or night sweats.  The patient is not ambulatory so he denies any dyspnea on exertion.  He denies any significant cough.  Denies any hemoptysis or chest pain.  He reports back pain for which it appears he is prescribed oxycodone every 4 hours as needed for pain.  It is unclear how often the patient is taking this as he is a poor historian and unable to communicate what medications he is receiving in the skilled nursing facility.  The patient denies any nausea,  vomiting, or diarrhea.  He reports constipation.  It appears that the patient is taking lactulose and stool softeners.  He denies any headache or visual changes.  The patient's family history consists of a father with lung cancer and a brother who passed away due to lung cancer.    The patient used to work at a plant for 17 years.  He is single and does not have any  children.  He reports that he would drink 100 to 200 packs of beer per year.  Denies any street drug use.  The patient quit smoking on 04/24/2020.  He smoked for approximately 54 years averaging 1 pack of cigarettes per day.  HPI  Past Medical History:  Diagnosis Date   Anxiety    Depression    Lumbar radiculopathy, chronic 2008   Marfan syndrome    Neurosis, depressive    Shortening, leg, congenital     Past Surgical History:  Procedure Laterality Date   LEG SURGERY      Family History  Problem Relation Age of Onset   Heart disease Mother     Social History Social History   Tobacco Use   Smoking status: Every Day    Types: Cigarettes   Smokeless tobacco: Never  Vaping Use   Vaping Use: Never used  Substance Use Topics   Alcohol use: Yes    Alcohol/week: 1.0 standard drink    Types: 1 Cans of beer per week    Comment: 3-4 beers daily   Drug use: No    Allergies  Allergen Reactions   Codeine Other (See Comments)    Made me feel "spaced out"    Current Outpatient Medications  Medication Sig Dispense Refill   acetaminophen (TYLENOL) 325 MG tablet Take 2 tablets (650 mg total) by mouth every 6 (six) hours as needed for mild pain (or Fever >/= 101). 30 tablet 0   bisacodyl (DULCOLAX) 5 MG EC tablet Take 1 tablet (5 mg total) by mouth daily. 30 tablet 0   clomiPRAMINE (ANAFRANIL) 25 MG capsule Take 4 capsules (100 mg total) by mouth at bedtime. As needed. 30 capsule 2   feeding supplement (ENSURE ENLIVE / ENSURE PLUS) LIQD Take 237 mLs by mouth 3 (three) times daily between meals. 563 mL 12   folic acid (FOLVITE) 1 MG tablet Take 1 tablet (1 mg total) by mouth daily. 30 tablet 0   gabapentin (NEURONTIN) 100 MG capsule Take 1 capsule (100 mg total) by mouth 3 (three) times daily. 90 capsule 0   hydrOXYzine (ATARAX) 25 MG tablet Take 1 tablet (25 mg total) by mouth 3 (three) times daily as needed for anxiety. 30 tablet 0   lactulose (CHRONULAC) 10 GM/15ML solution Take 45  mLs (30 g total) by mouth 2 (two) times daily. 236 mL 0   Multiple Vitamin (MULTIVITAMIN WITH MINERALS) TABS tablet Take 1 tablet by mouth daily. 30 tablet 0   nicotine (NICODERM CQ - DOSED IN MG/24 HOURS) 14 mg/24hr patch Place 1 patch (14 mg total) onto the skin daily. 28 patch 0   ondansetron (ZOFRAN) 4 MG tablet Take 1 tablet (4 mg total) by mouth every 6 (six) hours as needed for nausea. 20 tablet 0   oxyCODONE (OXY IR/ROXICODONE) 5 MG immediate release tablet Take 1-2 tablets (5-10 mg total) by mouth every 4 (four) hours as needed for moderate pain. 10 tablet 0   pantoprazole (PROTONIX) 40 MG tablet Take 1 tablet (40 mg total) by mouth daily. 30 tablet  0   senna-docusate (SENOKOT-S) 8.6-50 MG tablet Take 1 tablet by mouth 2 (two) times daily. 30 tablet 0   tamsulosin (FLOMAX) 0.4 MG CAPS capsule Take 1 capsule (0.4 mg total) by mouth daily after supper. 30 capsule 0   thiamine 100 MG tablet Take 1 tablet (100 mg total) by mouth daily. 30 tablet 0   No current facility-administered medications for this visit.    REVIEW OF SYSTEMS:   Review of Systems  Constitutional: Positive for fatigue, decreased appetite, weight loss.  Negative for appetite change, chills, and fever. HENT: Negative for mouth sores, nosebleeds, sore throat and trouble swallowing.   Eyes: Negative for eye problems and icterus.  Respiratory: Negative for cough, hemoptysis, shortness of breath and wheezing.   Cardiovascular: Negative for chest pain and leg swelling.  Gastrointestinal: Negative for abdominal pain, constipation, diarrhea, nausea and vomiting.  Genitourinary: Negative for bladder incontinence, difficulty urinating, dysuria, frequency and hematuria.   Musculoskeletal: Positive for back pain.  Positive for lower extremity weakness and inability to ambulate.  Negative for neck pain and neck stiffness.  Skin: Negative for itching and rash.  Neurological: Positive for lower extremity weakness and inability to  ambulate.  Negative for dizziness, headaches, light-headedness and seizures.  Hematological: Negative for adenopathy. Does not bruise/bleed easily.  Psychiatric/Behavioral: Negative for confusion, depression and sleep disturbance. The patient is not nervous/anxious.     PHYSICAL EXAMINATION:  Blood pressure 110/76, pulse (!) 116, temperature 98.5 F (36.9 C), temperature source Tympanic, resp. rate 18, height 6' 4"  (1.93 m), SpO2 96 %.  ECOG PERFORMANCE STATUS: 4  Physical Exam  Constitutional: Oriented to person, place, and time and thin/ Chronically ill-appearing male.  No acute distress.  Head: Normocephalic and atraumatic.  Mouth/Throat: Oropharynx is clear and moist. No oropharyngeal exudate.  Eyes: Conjunctivae are normal. Right eye exhibits no discharge. Left eye exhibits no discharge. No scleral icterus.  Neck: Normal range of motion. Neck supple.  Cardiovascular: Normal rate, regular rhythm, normal heart sounds and intact distal pulses.   Pulmonary/Chest: Effort normal and breath sounds normal. No respiratory distress. No wheezes. No rales.  Abdominal: Soft. Bowel sounds are normal. Exhibits no distension and no mass. There is no tenderness.  Musculoskeletal: Lower extremity weakness. Lymphadenopathy:    No cervical adenopathy.  Neurological: Alert and oriented to person, place, and time. Exhibits muscle wasting.  The patient is on a stretcher. Skin: Skin is warm and dry. No rash noted. Not diaphoretic. No erythema. No pallor.  Psychiatric: Mood, memory and judgment normal.  Patient does not always answer questions appropriately and occasionally gets off track with train of thought and repetitive.  Vitals reviewed.  LABORATORY DATA: Lab Results  Component Value Date   WBC 8.0 06/02/2021   HGB 10.2 (L) 06/02/2021   HCT 30.5 (L) 06/02/2021   MCV 87.9 06/02/2021   PLT 526 (H) 06/02/2021      Chemistry      Component Value Date/Time   NA 138 06/02/2021 1231   NA 146 (H)  07/04/2018 1642   K 3.5 06/02/2021 1231   CL 101 06/02/2021 1231   CO2 31 06/02/2021 1231   BUN 17 06/02/2021 1231   BUN 13 07/04/2018 1642   CREATININE 0.61 06/02/2021 1231   CREATININE 0.95 07/04/2012 1534      Component Value Date/Time   CALCIUM 8.5 (L) 06/02/2021 1231   ALKPHOS 105 06/02/2021 1231   AST 53 (H) 06/02/2021 1231   ALT 53 (H) 06/02/2021 1231  BILITOT 0.4 06/02/2021 1231       RADIOGRAPHIC STUDIES: CT HEAD W & WO CONTRAST (5MM)  Result Date: 05/04/2021 CLINICAL DATA:  Staging of non-small cell lung cancer. EXAM: CT HEAD WITHOUT AND WITH CONTRAST TECHNIQUE: Contiguous axial images were obtained from the base of the skull through the vertex without and with intravenous contrast. RADIATION DOSE REDUCTION: This exam was performed according to the departmental dose-optimization program which includes automated exposure control, adjustment of the mA and/or kV according to patient size and/or use of iterative reconstruction technique. CONTRAST:  38m OMNIPAQUE IOHEXOL 350 MG/ML SOLN COMPARISON:  05/09/2017 FINDINGS: Brain: Mild generalized volume loss. Mild chronic small-vessel ischemic change of the hemispheric white matter. No sign of recent infarction, mass lesion, hemorrhage, hydrocephalus or extra-axial collection. After contrast administration, no abnormal enhancement occurs. Vascular: There is atherosclerotic calcification of the major vessels at the base of the brain. Skull: Negative Sinuses/Orbits: Clear/normal Other: None IMPRESSION: No evidence of metastatic disease. Mild generalized volume loss and chronic small-vessel ischemic change of the cerebral hemispheric white matter. Electronically Signed   By: MNelson ChimesM.D.   On: 05/04/2021 10:29   DG Abd Portable 1V  Result Date: 05/09/2021 CLINICAL DATA:  Constipation EXAM: PORTABLE ABDOMEN - 1 VIEW COMPARISON:  None. FINDINGS: Supine frontal view of the abdomen and pelvis excludes the right flank and bilateral  hemidiaphragms by collimation. No bowel obstruction or ileus. Mild retained stool within the colon. No masses or abnormal calcifications. Bilateral hip osteoarthritis, left greater than right. IMPRESSION: 1. Mild fecal retention.  No bowel obstruction. Electronically Signed   By: MRanda NgoM.D.   On: 05/09/2021 16:19    ASSESSMENT: This is a very pleasant 66year old Caucasian male with stage IV non-small cell lung cancer, adenosquamous carcinoma.  The patient presented with a left upper lobe spiculated nodule with peripheral stranding and osseous metastatic disease, some of which involves epidural extension.  The patient was diagnosed in January 2023.  The patient's molecular studies show he is positive for K-ras G12 C mutation which may use in the second line setting.  The patient completed palliative radiation to the T12 lesion under the care of Dr. MTammi Klippelon 05/10/21.   Dr. MJulien Nordmannhad a lengthly discussion with the patient today about her current condition and treatment options. The patient was given the option of a referral to hospice/palliative vs. Treatment with systemic chemotherapy with carboplatin for an AUC of 5, Alimta 500 mg/m, and Keytruda 200 mg IV every 3 weeks.  Dr. MJulien Nordmanndiscussed with the patient that his condition is treatable but not curable.  Unlikely that the treatment would improve his mobility.   The patient is unsure what he would like to do at this time and is going to consider his options.  He presently is a resident of a skilled nursing facility.  Of note, the patient has a poor performance status of 4.  The patient is unable to get himself out of bed.   In the meantime, the patient requires a staging PET scan to complete the staging work-up.  I have placed the order today.  We will see the patient back for follow-up visit for 2 weeks for evaluation and to rediscuss the patient's final decision and if he would like to pursue treatment versus hospice care.  The  patient has poor family support.  He does not have any family that lives in the area.  He does not have any children.   Dr. MJulien Nordmannrediscussed with the  patient that the decision on when it is safe to return home is based on the nursing facility when it is safe, if ever, to return home.  We discussed the adverse side effects of treatment including but not limited to alopecia, myelosuppression, nausea and vomiting, peripheral neuropathy, liver or renal dysfunction as well as immunotherapy mediated adverse effects.    The patient voices understanding of current disease status and treatment options and is in agreement with the current care plan.  All questions were answered. The patient knows to call the clinic with any problems, questions or concerns. We can certainly see the patient much sooner if necessary.  Thank you so much for allowing me to participate in the care of [name]. I will continue to follow up the patient with you and assist in his care.   Disclaimer: This note was dictated with voice recognition software. Similar sounding words can inadvertently be transcribed and may not be corrected upon review.   Salvatore Poe L Nanea Jared June 02, 2021, 3:29 PM  ADDENDUM: Hematology/Oncology Attending: The face-to-face encounter with the patient today.  I reviewed his record, lab, scans and recommended his care plan.  This is a very pleasant 66 years old white male with past medical history significant for Marfan syndrome, chronic back pain, depression and anxiety as well as COPD.  He presented to the emergency department at [facility] almost 6 weeks ago complaining of worsening back pain that has been going on for 6 months and it became very challenging for him with ambulation.  Initial x-ray of the lumbar spine showed suspicious compression deformity at T12.  This was followed by CT scan of the thoracic and lumbar spine as well as MRIs of this region and that showed  multiple enhancing thoracic and lumbar spines lesions consistent with metastasis with destructive lesion involving the T12 vertebral body and left-sided posterior element with pathologic fracture and epidural tumor resulting in mild spinal stenosis.  He had staging work-up with CT scan of the chest, abdomen and pelvis that showed a 1.8 x 1.6 x 1.0 cm left upper lobe spiculated nodule consistent with primary neoplasm.  There was also slightly prominent to left hilar lymph nodes and suspicious bone lesions that was previously described on the MRIs.  The patient also has indeterminate 1.5 cm right adrenal mass.  He had CT-guided core biopsy of the left paraspinal lesion at T12-L1 that was conclusive but repeat biopsy on April 28, 2021 of the left paraspinal lesion showed poorly differentiated non-small cell lung cancer suspicious for adeno squamous carcinoma.  The molecular studies by foundation 1 showed KRAS G12C mutation.  MRI of the brain during his hospitalization was negative for malignancy.  During his hospitalization he was neurologically intact and the patient underwent palliative radiotherapy under the care of Dr. Tammi Klippel.  He is currently at a skilled nursing facility for rehabilitation but he has significant weakness in his lower extremities and he is unable to ambulate.  He came to the clinic today on a stretcher. I had a lengthy discussion with the patient today about his condition and treatment options. I explained to the patient that he has incurable condition and all the treatment will be of palliative nature.  I gave the patient the option of palliative care and hospice referral versus consideration of palliative systemic chemotherapy with carboplatin, Alimta and Keytruda for 4 cycles followed by maintenance Alimta and Keytruda until disease progression or unacceptable toxicity.  The patient also has positive KRAS G12C  mutation and if he has evidence for disease progression he could be considered  for treatment with targeted therapy with Lumakras (Sotorasib) or [employer], [employer]). I recommended for the patient to complete the staging work-up by ordering a PET scan. The patient would like some time to think about his option especially with the significant weakness.  His goal is to be able to go home but unfortunately he has significant weakness that he cannot be home by himself and he has no family members around to help him. I will see him back for follow-up visit in around 2-3 weeks for evaluation and more detailed discussion of his treatment options based on the PET scan results. The patient was advised to call immediately if he has any other concerning symptoms in the interval. Disclaimer: This note was dictated with voice recognition software. Similar sounding words can inadvertently be transcribed and may be missed upon review. The total time spent in the appointment was 60 minutes.  [name], MD

## 2021-06-02 NOTE — Patient Instructions (Addendum)
Summary:  -There are two main categories of lung cancer, they are named based on the size of the cancer cell. One is called Non-Small cell lung cancer. The other type is Small Cell Lung Cancer -The sample (biopsy) that they took of your tumor was consistent with a subtype of Non-small cell lung cancer called Adenosquamous.  -Given that this is stage IV, treatment would be palliative in nature. It is treatable but not curable.  -You have a marker for an oral drug, but this is only approved to be used if IV chemotherapy does not work. So the treatment option is chemotherapy/immunotherapy (see below). Otherwise, another option is to not pursue treatment at all and to focus on comfort care. Chemotherapy will likely not help you walk. You received radiation to this area which is the treatment for the back which is affecting your legs. Please continue to try to do physical therapy at the rehab/nursing facility.  -We covered a lot of important information at your appointment today regarding what the treatment option is moving forward. Here are the the main points that were discussed at your office visit with [place] today:  -The treatment consists of two chemotherapy drugs, called Carboplatin and Alimta (also called Pemetrexed) and one immunotherapy drug called Keytruda (pembrolizumab).  -If you decide to pursue treatment, we can start you the following week.  - If you underwent chemotherapy, we would arrange for a Chemotherapy Education Class. This involves having you sit down with one of our nurse educators. She would discuss with your one-on-one more details about your treatment as well as general information about resources here at the cancer center.  -Your treatment would be once every 3 weeks if you decide to do treatment. We would check your labs once a week for the first ~5 treatments just to make sure that important components of your blood are in an acceptable range -We would get a CT scan after 3 treatments  to check on the progress of treatment  Medications:  (medications I would need to arrange if pursing treatment) -Compazine : This medication is for nausea. You may take this every 6 hours as needed if you feel nauseous.  -You would need to take 1 mg of folic acid.  We need you to take 1 tablet every day.  -We will administer vitamin B12 every 9 weeks while you are here in the clinic.   Referrals or Imaging: -You need a PET scan. This shows [place] all the areas that are involved with cancer. I have placed the order. They should call you from the imaging department to schedule this. We would like the PET scan before your follow up in two weeks and then we can make a decision about treatment.    Follow up:  -We will see you after your PET scan in two weeks. In the meantime, consider whether you would like to pursue treatment vs focus on comfort care. The decision for when/if you go home is based on your facility.   -If you need to reach [place] at any time, the main office number to the cancer center is [phone], when you call, ask to speak to either Cassie's or Dr. Worthy Flank nurse.

## 2021-06-02 NOTE — Progress Notes (Signed)
Oncology Nurse Navigator Documentation  Oncology Nurse Navigator Flowsheets 06/02/2021 06/01/2021 05/17/2021 05/13/2021 05/10/2021 05/06/2021 05/05/2021  Abnormal Finding Date - - - - - 04/23/2021 -  Confirmed Diagnosis Date - - - - - 04/28/2021 -  Diagnosis Status Confirmed Diagnosis Complete - - - - Pending Molecular Studies -  Planned Course of Treatment Chemotherapy;Radiation - - - - Radiation -  Phase of Treatment Radiation - - - - Radiation -  Radiation Actual Start Date: 04/27/2021 - - - - 04/26/2021 -  Radiation Actual End Date: 05/10/2021 - - - - - -  Navigator Follow Up Date: 06/03/2021 06/02/2021 05/23/2021 05/17/2021 - 05/10/2021 05/09/2021  Navigator Follow Up Reason: Radiology New Patient Appointment Appointment Review New Patient Appointment - New Patient Appointment Appointment Review;Review Note  Navigator Location [facility]  Referral Date to RadOnc/MedOnc - - - - - - 05/05/2021  Navigator Encounter Type Clinic/MDC;Initial MedOnc Telephone Other: Other: Other: Pathology Review Other:  Telephone - Outgoing Call - - - - -  Treatment Initiated Date 04/27/2021 - - - - 04/26/2021 -  Patient Visit Type Initial;MedOnc Other Other Inpatient Inpatient Other -  Treatment Phase Other Pre-Tx/Tx Discussion - - - Other Other  Barriers/Navigation Needs Education/met Christopher Dawson today. He was transported via [place] and on a stretcher. Patient is not sure of his wishes for tx.  Will follow up on PET scan  Education;Coordination of Care Coordination of Care Coordination of Care Coordination of Care Coordination of Care Coordination of Care  Education Other Other - - - - -  Interventions Education Coordination of Care;Education Coordination of Care Coordination of Care Coordination of Care Coordination of Care Coordination of Care  Acuity Level 2-Minimal Needs (1-2 Barriers Identified) Level 2-Minimal Needs (1-2  Barriers Identified) Level 2-Minimal Needs (1-2 Barriers Identified) Level 2-Minimal Needs (1-2 Barriers Identified) Level 2-Minimal Needs (1-2 Barriers Identified) Level 2-Minimal Needs (1-2 Barriers Identified) Level 2-Minimal Needs (1-2 Barriers Identified)  Coordination of Care - Other - Appts Appts Pathology Other  Education Method Verbal Verbal - - - - -  Time Spent with Patient 15 30 - 30 30 30  30

## 2021-06-03 ENCOUNTER — Encounter: Payer: Self-pay | Admitting: *Deleted

## 2021-06-03 NOTE — Progress Notes (Signed)
Oncology Nurse Navigator Documentation  Oncology Nurse Navigator Flowsheets 06/03/2021 06/02/2021 06/01/2021 05/17/2021 05/13/2021 05/10/2021 05/06/2021  Abnormal Finding Date - - - - - - 04/23/2021  Confirmed Diagnosis Date - - - - - - 04/28/2021  Diagnosis Status - Confirmed Diagnosis Complete - - - - Pending Molecular Studies  Planned Course of Treatment - Chemotherapy;Radiation - - - - Radiation  Phase of Treatment - Radiation - - - - Radiation  Radiation Actual Start Date: - 04/27/2021 - - - - 04/26/2021  Radiation Actual End Date: - 05/10/2021 - - - - -  Navigator Follow Up Date: 06/14/2021 06/03/2021 06/02/2021 05/23/2021 05/17/2021 - 05/10/2021  Navigator Follow Up Reason: Patient Call Radiology New Patient Appointment Appointment Review New Patient Appointment - New Patient Appointment  Navigator Location [place]  Referral Date to RadOnc/MedOnc - - - - - - -  Navigator Encounter Type Telephone Clinic/MDC;Initial MedOnc Telephone Other: Other: Other: Pathology Review  Telephone Outgoing Call - Outgoing Call - - - -  Treatment Initiated Date - 04/27/2021 - - - - 04/26/2021  Patient Visit Type Other Initial;MedOnc Other Other Inpatient Inpatient Other  Treatment Phase Other Other Pre-Tx/Tx Discussion - - - Other  Barriers/Navigation Needs Coordination of Care;Education/I followed up on Christopher Dawson schedule. He needs PET scan before he sees Christopher Dawson. I called radiology and was given an appt for scan. I then called [facility] and update on appts time and place. As well as pre-procedure instructions for PET.  Education Education;Coordination of Care Coordination of Care Coordination of Care Coordination of Care Coordination of Care  Education Other Other Other - - - -  Interventions Coordination of Care;Education Education Coordination of Care;Education Coordination of Care Coordination of Care  Coordination of Care Coordination of Care  Acuity Level 3-Moderate Needs (3-4 Barriers Identified) Level 2-Minimal Needs (1-2 Barriers Identified) Level 2-Minimal Needs (1-2 Barriers Identified) Level 2-Minimal Needs (1-2 Barriers Identified) Level 2-Minimal Needs (1-2 Barriers Identified) Level 2-Minimal Needs (1-2 Barriers Identified) Level 2-Minimal Needs (1-2 Barriers Identified)  Coordination of Care Appts - Other - Appts Appts Pathology  Education Method Verbal Verbal Verbal - - - -  Time Spent with Patient 30 15 30  - 30 30 30

## 2021-06-06 ENCOUNTER — Telehealth: Payer: Self-pay

## 2021-06-06 NOTE — Telephone Encounter (Signed)
Message received from Lurline Hare, Therapist, sports for pts RN at [facility]. She states the pt has advised her he wants to pursue tx.  I have advised Ms. Higinio Plan that pt has a follow-up appt on Thursday 06/16/21 to revisit his options and his sister (POA) plans to present for the appt as well. Ms. Higinio Plan expressed understanding of this information.

## 2021-06-14 ENCOUNTER — Telehealth: Payer: Self-pay

## 2021-06-14 ENCOUNTER — Telehealth: Payer: Self-pay | Admitting: Medical Oncology

## 2021-06-14 NOTE — Telephone Encounter (Signed)
Called patient, no answer, left a voicemail reminder of patient's 10:00am-06/15/21 telephone appointment w/ Ashlyn Bruning PA-C. I left my extension [phone] and asked that patient return my call prior to appointment time/date, so that I may complete the nursing portion of patient's appointment.

## 2021-06-14 NOTE — Progress Notes (Signed)
°  Radiation Oncology         [phone] ________________________________  Name: Christopher Dawson MRN: [id number]  Date: 05/10/2021  DOB: 14-Dec-1955  End of Treatment Note  Diagnosis:    66 y.o. 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   Indication for treatment:  Palliative       Radiation treatment dates:   04/27/21 - 05/10/21  Site/dose:   The painful bony metastases at T9 and T12 were treated to 30 Gy in 10 fractions.  Beams/energy:   A 3D field set-up was employed with 6 MV X-rays  Narrative: The patient tolerated radiation treatment relatively well.     Plan: The patient has completed radiation treatment. The patient will return to radiation oncology clinic for routine followup in one month. I advised him to call or return sooner if he has any questions or concerns related to his recovery or treatment. ________________________________  Sheral Apley. Tammi Klippel, M.D.

## 2021-06-14 NOTE — Telephone Encounter (Signed)
Christopher Dawson re prep instructions or PET scan.  I gave her information in appt notes.

## 2021-06-15 ENCOUNTER — Ambulatory Visit (HOSPITAL_COMMUNITY)
Admission: RE | Admit: 2021-06-15 | Discharge: 2021-06-15 | Disposition: A | Discharge status: Home / Self Care | Payer: Medicare Other | Source: Ambulatory Visit | Attending: Physician Assistant | Admitting: Physician Assistant

## 2021-06-15 ENCOUNTER — Ambulatory Visit: Admit: 2021-06-15 | Payer: Medicare Other | Admitting: Urology

## 2021-06-15 ENCOUNTER — Other Ambulatory Visit: Payer: Self-pay

## 2021-06-15 ENCOUNTER — Telehealth: Payer: Self-pay

## 2021-06-15 ENCOUNTER — Non-Acute Institutional Stay: Payer: Medicare Other | Admitting: Family Medicine

## 2021-06-15 DIAGNOSIS — C3490 Malignant neoplasm of unspecified part of unspecified bronchus or lung: Secondary | ICD-10-CM | POA: Insufficient documentation

## 2021-06-15 DIAGNOSIS — C7951 Secondary malignant neoplasm of bone: Secondary | ICD-10-CM | POA: Insufficient documentation

## 2021-06-15 LAB — GLUCOSE, CAPILLARY: Glucose-Capillary: 92 mg/dL (ref 70–99)

## 2021-06-15 IMAGING — CT NM PET TUM IMG INITIAL (PI) SKULL BASE T - THIGH
1 of 8 series · 1 of 25 positions shown · non-contrast
Comparison: Above

CLINICAL DATA: Subsequent treatment strategy for metastatic
non-small cell lung cancer.

EXAM:
NUCLEAR MEDICINE PET SKULL BASE TO THIGH
TECHNIQUE: 7.39 mCi F-18 FDG was injected intravenously. Full-ring PET imaging
was performed from the skull base to thigh after the radiotracer. CT
data was obtained and used for attenuation correction and anatomic
localization.
Fasting blood glucose: 92 mg/dl

[Series 4: ct sk_thigh 5.0 bf37 · axial · 5.0mm · 0.98mm/px · 1 of 234 slices shown]
[im 234/234  brain]
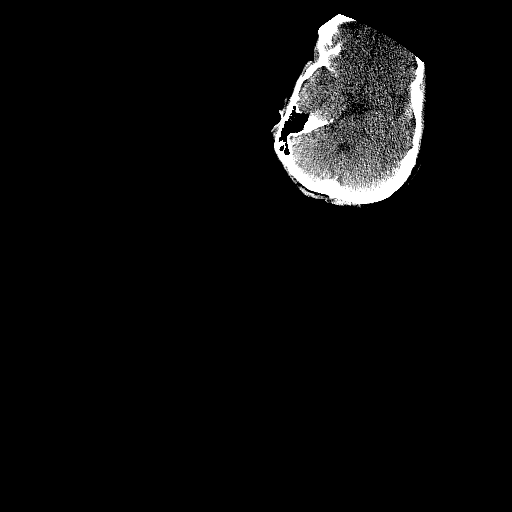

[1 of 25 positions shown; findings below may reference images not displayed]

FINDINGS: Mediastinal blood pool activity: SUV max

Liver activity: SUV max NA

NECK: No hypermetabolic neck mass or cervical lymphadenopathy.

Incidental CT findings: none

CHEST: The left upper lobe pulmonary lesion is slightly larger. It
measures 19.5 x 18 mm and previously measured 18 x 16 mm. SUV max is
10.03. The 8 mm prevascular node not show any hypermetabolism. No
enlarged or hypermetabolic mediastinal lymph nodes.

New diffuse irregular nodular and interstitial process in the lungs
with associated patchy hypermetabolism is most likely an
inflammatory or infectious process. No discrete pulmonary nodules to
suggest pulmonary metastatic disease. Somewhat lobulated density in
the left lower lobe on image number 60/8 is not hypermetabolic and
is likely inflammation or atelectasis.

No supraclavicular or axillary adenopathy.

Incidental CT findings: Stable underlying emphysematous changes.

ABDOMEN/PELVIS: Curvilinear area of hypermetabolism in the medial
aspect of the right hepatic lobe is likely radiation change. No
findings for hepatic or adrenal gland metastatic disease.

Hypermetabolic focus in the pelvis adjacent to the upper sigmoid
colon is likely an inflamed diverticulum.

Hypermetabolic focus associated with the anterior peritoneum on the
left side corresponding to a soft tissue lesion measuring
approximately 2 cm. This was also present on the prior CT scan where
it showed rim enhancement. SUV max is 7.1 a and this is likely a
peritoneal metastasis.

Incidental CT findings: A Foley catheter is noted in the bladder.
Scattered vascular calcifications.

SKELETON: Overall stable appearing destructive metastatic disease
involving the T12 vertebral body with pathologic fracture. I do not
see any progressive findings or progressive canal compromise. The
left T12 rib is also destroyed along with the pedicle and part of
the lamina and facet on the left side. SUV max is 5.87.

Stable lytic metastatic focus involving the left side of T9. No new
or progressive changes. SUV max is

Stable stable appearing lesion in the T5 vertebral body. SUV max is
11.02. Hypermetabolic focus involving the right lateral mass of C1,
likely a metastatic focus. SUV max is 7.84. No pathologic fracture.

FDG uptake noted in the soft tissues of the posterior pelvis which
appear to be associated with early decubitus ulcers.

Incidental CT findings: none
IMPRESSION: 1. Slight interval enlargement of the left upper lobe lung lesion.
2. New patchy interstitial and airspace process in the lungs likely
inflammation or infection. No findings to suggest pulmonary
metastatic disease.
3. No hypermetabolic mediastinal or hilar adenopathy.
4. Metastatic bone disease as detailed above. The most significant
finding is at T12 with pathologic fracture but no progressive canal
compromise.
5. Peritoneal or possible muscle metastasis involving the
transversus abdominus muscle on the left.
6. Early decubitus ulcers.

## 2021-06-15 MED ORDER — FLUDEOXYGLUCOSE F - 18 (FDG) INJECTION
7.3900 | Freq: Once | INTRAVENOUS | Status: AC | PRN
  Administered 2021-06-15: 7.39 via INTRAVENOUS

## 2021-06-15 NOTE — Telephone Encounter (Signed)
Called patient x2, no answer, left a voicemail reminder of patient's 10:00am-06/15/21 telephone appointment w/ Ashlyn Bruning PA-C. I left my extension [phone] and asked that patient return my call prior to appointment time, so that I may complete the nursing portion of patient's appointment.

## 2021-06-16 ENCOUNTER — Inpatient Hospital Stay (HOSPITAL_BASED_OUTPATIENT_CLINIC_OR_DEPARTMENT_OTHER): Payer: Medicare Other | Admitting: *Deleted

## 2021-06-16 ENCOUNTER — Inpatient Hospital Stay (HOSPITAL_BASED_OUTPATIENT_CLINIC_OR_DEPARTMENT_OTHER): Payer: Medicare Other | Admitting: Internal Medicine

## 2021-06-16 VITALS — BP 109/55 | HR 121 | Temp 98.1°F | Resp 20 | Ht 76.0 in | Wt 165.0 lb

## 2021-06-16 DIAGNOSIS — C7951 Secondary malignant neoplasm of bone: Secondary | ICD-10-CM | POA: Diagnosis not present

## 2021-06-16 DIAGNOSIS — Z5111 Encounter for antineoplastic chemotherapy: Secondary | ICD-10-CM | POA: Insufficient documentation

## 2021-06-16 DIAGNOSIS — Z923 Personal history of irradiation: Secondary | ICD-10-CM

## 2021-06-16 DIAGNOSIS — C3412 Malignant neoplasm of upper lobe, left bronchus or lung: Secondary | ICD-10-CM

## 2021-06-16 DIAGNOSIS — C3490 Malignant neoplasm of unspecified part of unspecified bronchus or lung: Secondary | ICD-10-CM

## 2021-06-16 DIAGNOSIS — E538 Deficiency of other specified B group vitamins: Secondary | ICD-10-CM | POA: Diagnosis not present

## 2021-06-16 DIAGNOSIS — C3492 Malignant neoplasm of unspecified part of left bronchus or lung: Secondary | ICD-10-CM

## 2021-06-16 DIAGNOSIS — Z5112 Encounter for antineoplastic immunotherapy: Secondary | ICD-10-CM | POA: Insufficient documentation

## 2021-06-16 MED ORDER — CYANOCOBALAMIN 1000 MCG/ML IJ SOLN
1000.0000 ug | Freq: Once | INTRAMUSCULAR | Status: AC
  Administered 2021-06-16: 1000 ug via INTRAMUSCULAR

## 2021-06-16 MED ORDER — PROCHLORPERAZINE MALEATE 10 MG PO TABS: 10.0000 mg | ORAL_TABLET | Freq: Four times a day (QID) | ORAL | 0 refills | Status: DC | PRN

## 2021-06-16 MED ORDER — FOLIC ACID 1 MG PO TABS: 1.0000 mg | ORAL_TABLET | Freq: Every day | ORAL | 4 refills | Status: DC

## 2021-06-16 MED ORDER — CYANOCOBALAMIN 1000 MCG/ML IJ SOLN: 1000.0000 ug | Freq: Once | INTRAMUSCULAR | Status: DC

## 2021-06-16 NOTE — Addendum Note (Signed)
Addended by: Ardeen Garland on: 06/16/2021 10:40 AM   Modules accepted: Orders

## 2021-06-16 NOTE — Progress Notes (Signed)
START ON PATHWAY REGIMEN - Non-Small Cell Lung     A cycle is every 21 days:     Pembrolizumab      Pemetrexed      Carboplatin   **Always confirm dose/schedule in your pharmacy ordering system**  Patient Characteristics: Stage IV Metastatic, Nonsquamous, Molecular Analysis Completed, Molecular Alteration Present and Targeted Therapy Exhausted OR EGFR Exon 20+ or KRAS G12C+ or HER2+ Present and No Prior Chemo/Immunotherapy OR No Alteration Present, Initial  Chemotherapy/Immunotherapy, PS = 0, 1, BRAF/MET/KRAS/HER2 Mutation Positive, Candidate for Immunotherapy, PD-L1 Expression Positive 1-49% (TPS) / Negative / Not Tested / Awaiting Test Results and Immunotherapy Candidate Therapeutic Status: Stage IV Metastatic Histology: Nonsquamous Cell Broad Molecular Profiling Status: Molecular Analysis Completed Molecular Analysis Results: KRAS G12C Mutation Present and No Prior Chemo/Immunotherapy ECOG Performance Status: 1 Chemotherapy/Immunotherapy Line of Therapy: Initial Chemotherapy/Immunotherapy Immunotherapy Candidate Status: Candidate for Immunotherapy PD-L1 Expression Status: PD-L1 Positive 1-49% (TPS) Intent of Therapy: Non-Curative / Palliative Intent, Not Discussed with Patient

## 2021-06-16 NOTE — Progress Notes (Signed)
[facility] [phone]   [phone]  OFFICE PROGRESS NOTE  [name], MD [place] [address]  DIAGNOSIS: Stage IV (T1c, N1, M1 C) non-small cell lung cancer, adenosquamous carcinoma presented with left upper lobe lung nodule in addition to left hilar adenopathy and bone metastasis diagnosed January 2023.  Molecular studies by foundation 1 showed positive KRAS G12C mutation  PD-L1 expression 10%  PRIOR THERAPY: Palliative radiotherapy to the T12 destructive pathologic fracture under the care of Dr. Tammi Klippel completed on May 10, 2021.  CURRENT THERAPY: Systemic chemotherapy with carboplatin for AUC of 5, Alimta 500 Mg/M2 and Keytruda 200 Mg IV every 3 weeks.  First dose June 23, 2021.  INTERVAL HISTORY: Christopher Dawson 66 y.o. male returns to the clinic today for follow-up visit on a stretcher brought by [facility] from the skilled nursing facility.  The patient is feeling fine today with no concerning complaints except for the persistent weakness in the lower extremities.  He is still in a skilled nursing facility with rehabilitation.  He denied having any current chest pain, shortness of breath, cough or hemoptysis.  He has no nausea, vomiting, diarrhea or constipation.  He has no headache or visual changes.  He had a PET scan performed yesterday but the final report is still pending.  The patient is here today for evaluation and discussion of his treatment options.  MEDICAL HISTORY: Past Medical History:  Diagnosis Date   Anxiety    Depression    Lumbar radiculopathy, chronic 2008   Marfan syndrome    Neurosis, depressive    Shortening, leg, congenital     ALLERGIES:  is allergic to codeine.  MEDICATIONS:  Current Outpatient Medications  Medication Sig Dispense Refill   acetaminophen (TYLENOL) 325 MG tablet Take 2 tablets (650 mg total) by mouth every 6 (six) hours as needed for mild pain (or Fever  >/= 101). 30 tablet 0   bisacodyl (DULCOLAX) 5 MG EC tablet Take 1 tablet (5 mg total) by mouth daily. 30 tablet 0   clomiPRAMINE (ANAFRANIL) 25 MG capsule Take 4 capsules (100 mg total) by mouth at bedtime. As needed. 30 capsule 2   feeding supplement (ENSURE ENLIVE / ENSURE PLUS) LIQD Take 237 mLs by mouth 3 (three) times daily between meals. 956 mL 12   folic acid (FOLVITE) 1 MG tablet Take 1 tablet (1 mg total) by mouth daily. 30 tablet 0   gabapentin (NEURONTIN) 100 MG capsule Take 1 capsule (100 mg total) by mouth 3 (three) times daily. 90 capsule 0   hydrOXYzine (ATARAX) 25 MG tablet Take 1 tablet (25 mg total) by mouth 3 (three) times daily as needed for anxiety. 30 tablet 0   lactulose (CHRONULAC) 10 GM/15ML solution Take 45 mLs (30 g total) by mouth 2 (two) times daily. 236 mL 0   Multiple Vitamin (MULTIVITAMIN WITH MINERALS) TABS tablet Take 1 tablet by mouth daily. 30 tablet 0   nicotine (NICODERM CQ - DOSED IN MG/24 HOURS) 14 mg/24hr patch Place 1 patch (14 mg total) onto the skin daily. 28 patch 0   ondansetron (ZOFRAN) 4 MG tablet Take 1 tablet (4 mg total) by mouth every 6 (six) hours as needed for nausea. 20 tablet 0   oxyCODONE (OXY IR/ROXICODONE) 5 MG immediate release tablet Take 1-2 tablets (5-10 mg total) by mouth every 4 (four) hours as needed for moderate pain. 10 tablet 0   pantoprazole (PROTONIX) 40 MG tablet  Take 1 tablet (40 mg total) by mouth daily. 30 tablet 0   senna-docusate (SENOKOT-S) 8.6-50 MG tablet Take 1 tablet by mouth 2 (two) times daily. 30 tablet 0   tamsulosin (FLOMAX) 0.4 MG CAPS capsule Take 1 capsule (0.4 mg total) by mouth daily after supper. 30 capsule 0   thiamine 100 MG tablet Take 1 tablet (100 mg total) by mouth daily. 30 tablet 0   No current facility-administered medications for this visit.    SURGICAL HISTORY:  Past Surgical History:  Procedure Laterality Date   LEG SURGERY      REVIEW OF SYSTEMS:  Constitutional: positive for  fatigue Eyes: negative Ears, nose, mouth, throat, and face: negative Respiratory: negative Cardiovascular: negative Gastrointestinal: negative Genitourinary:negative Integument/breast: negative Hematologic/lymphatic: negative Musculoskeletal:positive for muscle weakness Neurological: positive for weakness Behavioral/Psych: negative Endocrine: negative Allergic/Immunologic: negative   PHYSICAL EXAMINATION: General appearance: alert, cooperative, fatigued, and no distress Head: Normocephalic, without obvious abnormality, atraumatic Neck: no adenopathy, no JVD, supple, symmetrical, trachea midline, and thyroid not enlarged, symmetric, no tenderness/mass/nodules Lymph nodes: Cervical, supraclavicular, and axillary nodes normal. Resp: clear to auscultation bilaterally Back: symmetric, no curvature. ROM normal. No CVA tenderness. Cardio: regular rate and rhythm, S1, S2 normal, no murmur, click, rub or gallop GI: soft, non-tender; bowel sounds normal; no masses,  no organomegaly Extremities: extremities normal, atraumatic, no cyanosis or edema Neurologic: Motor: Significant weakness in the lower extremities bilaterally.  ECOG PERFORMANCE STATUS: 1 - Symptomatic but completely ambulatory  Blood pressure (!) 109/55, pulse (!) 121, temperature 98.1 F (36.7 C), temperature source Tympanic, resp. rate 20, height 6' 4"  (1.93 m), weight 165 lb (74.8 kg), SpO2 97 %.  LABORATORY DATA: Lab Results  Component Value Date   WBC 8.0 06/02/2021   HGB 10.2 (L) 06/02/2021   HCT 30.5 (L) 06/02/2021   MCV 87.9 06/02/2021   PLT 526 (H) 06/02/2021      Chemistry      Component Value Date/Time   NA 138 06/02/2021 1231   NA 146 (H) 07/04/2018 1642   K 3.5 06/02/2021 1231   CL 101 06/02/2021 1231   CO2 31 06/02/2021 1231   BUN 17 06/02/2021 1231   BUN 13 07/04/2018 1642   CREATININE 0.61 06/02/2021 1231   CREATININE 0.95 07/04/2012 1534      Component Value Date/Time   CALCIUM 8.5 (L)  06/02/2021 1231   ALKPHOS 105 06/02/2021 1231   AST 53 (H) 06/02/2021 1231   ALT 53 (H) 06/02/2021 1231   BILITOT 0.4 06/02/2021 1231       RADIOGRAPHIC STUDIES: No results found.  ASSESSMENT AND PLAN: This is a very pleasant 66 years old white male with a stage IV (T1c, M1, M1 C) non-small cell lung cancer, adenosquamous carcinoma presented with left upper lobe lung nodule in addition to left hilar adenopathy and bone metastasis diagnosed in January 2023 with positive KRAS G12C mutation and PD-L1 expression of 10%. The patient had a PET scan performed yesterday.  I personally and independently reviewed the scan images and discussed the result with the patient today.  The final report is still pending. I discussed with the patient his treatment options.  He understands that he has incurable condition and all the treatment will be of palliative nature.  He also understand that treatment may not improve his lower extremity weakness which was the result of the metastatic disease at T12 status post radiotherapy. He was given the option of palliative care on hospice versus consideration of palliative systemic  chemotherapy with carboplatin for AUC of 5, Alimta 500 Mg/M2 and Keytruda 200 Mg IV every 3 weeks. The patient is interested in treatment.  I will arrange for him to start the first cycle of this treatment next week. I discussed with him the adverse effect of this treatment including but not limited to alopecia, myelosuppression, nausea and vomiting, peripheral neuropathy, liver or renal dysfunction as well as immunotherapy adverse effects. The patient will have a chemotherapy education class before the first dose of his treatment. He will receive vitamin B12 injection today. I will call his pharmacy with prescription for folic acid 1 mg p.o. daily in addition to Compazine 10 mg p.o. every 6 hours as needed for nausea. The patient will come back for follow-up visit in 2 weeks for evaluation and  management of any adverse effect of his treatment. He was advised to call immediately if he has any other concerning symptoms in the interval.  The patient voices understanding of current disease status and treatment options and is in agreement with the current care plan.  All questions were answered. The patient knows to call the clinic with any problems, questions or concerns. We can certainly see the patient much sooner if necessary.  The total time spent in the appointment was 55 minutes.  Disclaimer: This note was dictated with voice recognition software. Similar sounding words can inadvertently be transcribed and may not be corrected upon review.

## 2021-06-17 ENCOUNTER — Other Ambulatory Visit: Payer: Self-pay

## 2021-06-17 ENCOUNTER — Encounter: Payer: Self-pay | Admitting: Family Medicine

## 2021-06-17 ENCOUNTER — Non-Acute Institutional Stay: Payer: Medicare Other | Admitting: Family Medicine

## 2021-06-17 VITALS — BP 108/68 | HR 112 | Temp 97.4°F | Resp 20

## 2021-06-17 DIAGNOSIS — G893 Neoplasm related pain (acute) (chronic): Secondary | ICD-10-CM

## 2021-06-17 DIAGNOSIS — Z515 Encounter for palliative care: Secondary | ICD-10-CM

## 2021-06-17 NOTE — Progress Notes (Signed)
[facility] Palliative Care Consult Note Telephone: [phone]  Fax: [phone]   Date of encounter: 06/17/21 9:00 AM PATIENT NAME: Christopher Dawson [place] [id number]   [phone] (home)  DOB: [birth year] MRN: [id number] PRIMARY CARE PROVIDER:    [name], MD,  [place] [address] [phone]  REFERRING PROVIDER:   [name], [address] [place],  [place] [address] [phone]  RESPONSIBLE PARTY:    Contact Information     Name Relation Home Work Mobile   Sauerwein,Lou Mother [phone]       Sister Tommi Rumps is durable POA   I met face to face with patient in [name] skilled nursing facility. Palliative Care was asked to follow this patient by consultation request of  [name],* to address advance care planning and complex medical decision making. This is the initial visit.          ASSESSMENT, SYMPTOM MANAGEMENT AND PLAN / RECOMMENDATIONS:   Palliative Care Encounter Advised on options for Hospice in SNF, home or inpatient if needs symptom management.   Advised if unable to walk and fix meals would have to have 24/7 caregiver to be in home and this is not provided by Hospice. Will continue to follow for code status and goals of care as course of disease becomes more clear.  2.  Cancer related pain Pain in low back, radiation not effective in controlling pain. Change OxyContin scheduled to daily 12 hours after fentanyl patch applied. Continue OxyCodone 5/325 mg Q 4 hrs prn for breakthrough pain. Start Fentanyl patch 12 mcg Q 72 hrs, continue to follow and titrate as needed to effective pain control without respiratory depression.   Follow up Palliative Care Visit: Palliative care will continue to follow for complex medical decision making, advance care planning, and clarification of goals. Return 1-2 weeks or  prn.    This visit was coded based on medical decision making (MDM).  PPS: 40%  HOSPICE ELIGIBILITY/DIAGNOSIS: TBD  Chief Complaint:  [place] received a referral to follow up with patient for chronic disease management, advance directive and defining/refining goals of care in patient with lung cancer metastatic to bone.  HISTORY OF PRESENT ILLNESS:  Christopher Dawson is a 66 y.o. 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  He has a right adrenal mass and constipation with a hx of alcohol abuse, Marfan Syndrome. Pt states he was told that if he did not do chemotherapy that his life expectancy would be weeks and if he did that he might have 2 months.  He is currently planning to start chemotherapy.  He states he does not want to die in a facility and wants to go home.  Nursing indicates that he has OxyContin scheduled and OxyCodone for breakthrough pain, that he is unable to walk and thus has bilateral pressure ulcers on bilateral hips and intake is poor.  Pt rates low back pain a 7/10 at best and 10/10 at worst. He completed a course of radiation at end of January.  He states when attempting to ambulate he is weak and shaky.  He is currently only able to stand/pivot to transfer.  He reports appetite as "good" with no nausea or vomiting.  Endorses constipation stating that Dulcolax and lactulose are helping but  BMs are painful. Had catheter placed when he had the most constipation because of urinary retention. Endorses SOB only in early am.    History obtained from review of EMR, interview with facility staff and/or Mr. Klippel.  I reviewed available labs, medications, imaging, studies and related documents from the EMR.  Records reviewed and summarized above.   ROS General: NAD EYES: denies vision changes ENMT: denies  dysphagia Cardiovascular: denies chest pain, endorses some DOE in am Pulmonary: denies cough, endorses increased SOB in am Abdomen: endorses good appetite, endorses continence of bowel,  constipation GU: denies dysuria, has catheter in place draining amber urine MSK:  endorses increased weakness of BLE, no falls reported Skin: denies rashes.  Has pressure ulcers bilateral hips Neurological: endorses central midback pain, denies insomnia Psych: Endorses depressed mood and difficulty coping Heme/lymph/immuno: denies bruises, abnormal bleeding  Physical Exam: Current and past weights: 165 lbs as of 06/16/21 Constitutional: NAD General: frail appearing, thin male seen lying in bed EYES: anicteric sclera, lids intact, no discharge  ENMT: intact hearing, oral mucous membranes moist, dentition intact CV: S1S2, RRR, no LE edema Pulmonary: CTAB, no increased work of breathing, no cough, room air Abdomen: hypo-active BS + 4 quadrants, soft and non tender, no ascites GU: deferred MSK: noted sarcopenia of BLE, moves all extremities, can stand/pivot to chair Skin: warm and dry, no rashes or wounds on visible skin-did not attempt to visualize hips Neuro:  noted generalized weakness of BLE, some difficulty with word finding Psych: non-anxious affect, A and O x 3 Hem/lymph/immuno: no widespread bruising  CURRENT PROBLEM LIST:  Patient Active Problem List   Diagnosis Date Noted   Non-small cell carcinoma of left lung, stage 4 ([place]) 06/16/2021   Encounter for antineoplastic chemotherapy 06/16/2021   Encounter for antineoplastic immunotherapy 06/16/2021   Hyponatremia 05/05/2021   Cancer related pain 05/05/2021   Depression 05/05/2021   COPD (chronic obstructive pulmonary disease) (HCC) 05/05/2021   Constipation 05/05/2021   Right adrenal mass ([place]) 05/05/2021   Alcohol use 05/05/2021   Intractable back pain 04/23/2021   Elevated BP without diagnosis of hypertension 11/25/2020   Poor social  situation 11/25/2020   Alcohol withdrawal syndrome without complication (HCC)    H/O alcohol abuse 05/09/2017   Hypocalcemia 05/09/2017   Leukopenia 05/09/2017   Thrombocytopenia ([place]) 05/09/2014   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   Abnormality of gait 04/12/2013   Tobacco use disorder 07/04/2012   GAD (generalized anxiety disorder) 07/04/2012   Major depressive disorder, recurrent episode ([place]) 07/04/2012   Hyperlipidemia 06/24/2012   PAST MEDICAL HISTORY:  Active Ambulatory Problems    Diagnosis Date Noted   Tobacco use disorder 07/04/2012   GAD (generalized anxiety disorder) 07/04/2012   Major depressive disorder, recurrent episode ([place]) 07/04/2012   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   Abnormality of gait 04/12/2013   H/O alcohol abuse 05/09/2017   Alcohol withdrawal syndrome without complication (HCC)    Hypocalcemia 05/09/2017   Hyperlipidemia 06/24/2012   Leukopenia 05/09/2017   Thrombocytopenia ([place]) 05/09/2014   Elevated BP without diagnosis of hypertension 11/25/2020   Poor social situation 11/25/2020   Intractable back pain 04/23/2021   Hyponatremia 05/05/2021   Cancer related pain 05/05/2021   Depression 05/05/2021   COPD (chronic obstructive pulmonary disease) ([place]) 05/05/2021   Constipation 05/05/2021   Right adrenal mass ([place]) 05/05/2021   Alcohol use 05/05/2021   Non-small cell carcinoma of left lung, stage 4 ([place]) 06/16/2021   Encounter for antineoplastic chemotherapy 06/16/2021   Encounter for antineoplastic immunotherapy  06/16/2021   Resolved Ambulatory Problems    Diagnosis Date Noted   Influenza A 05/09/2017   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 ([place]) 05/05/2021   Past Medical History:  Diagnosis Date   Anxiety    Lumbar radiculopathy, chronic 2008   Neurosis, depressive    Shortening, leg, congenital    SOCIAL HX:  Social History   Tobacco Use   Smoking status: Every Day    Types: Cigarettes   Smokeless  tobacco: Never  Substance Use Topics   Alcohol use: Yes    Alcohol/week: 1.0 standard drink    Types: 1 Cans of beer per week    Comment: 3-4 beers daily   FAMILY HX:  Family History  Problem Relation Age of Onset   Heart disease Mother        Preferred Pharmacy: ALLERGIES:  Allergies  Allergen Reactions   Codeine Other (See Comments)    Made me feel "spaced out"     PERTINENT MEDICATIONS:  Outpatient Encounter Medications as of 06/17/2021  Medication Sig   acetaminophen (TYLENOL) 325 MG tablet Take 2 tablets (650 mg total) by mouth every 6 (six) hours as needed for mild pain (or Fever >/= 101).   bisacodyl (DULCOLAX) 5 MG EC tablet Take 1 tablet (5 mg total) by mouth daily.   clomiPRAMINE (ANAFRANIL) 25 MG capsule Take 4 capsules (100 mg total) by mouth at bedtime. As needed.   feeding supplement (ENSURE ENLIVE / ENSURE PLUS) LIQD Take 237 mLs by mouth 3 (three) times daily between meals.   folic acid (FOLVITE) 1 MG tablet Take 1 tablet (1 mg total) by mouth daily.   gabapentin (NEURONTIN) 100 MG capsule Take 1 capsule (100 mg total) by mouth 3 (three) times daily.   hydrOXYzine (ATARAX) 25 MG tablet Take 1 tablet (25 mg total) by mouth 3 (three) times daily as needed for anxiety.   lactulose (CHRONULAC) 10 GM/15ML solution Take 45 mLs (30 g total) by mouth 2 (two) times daily.   Multiple Vitamin (MULTIVITAMIN WITH MINERALS) TABS tablet Take 1 tablet by mouth daily.   Multiple Vitamins-Minerals (DECUBI-VITE) CAPS Take by mouth.   nicotine (NICODERM CQ - DOSED IN MG/24 HOURS) 14 mg/24hr patch Place 1 patch (14 mg total) onto the skin daily.   ondansetron (ZOFRAN) 4 MG tablet Take 1 tablet (4 mg total) by mouth every 6 (six) hours as needed for nausea.   oxyCODONE (OXY IR/ROXICODONE) 5 MG immediate release tablet Take 1-2 tablets (5-10 mg total) by mouth every 4 (four) hours as needed for moderate pain.   pantoprazole (PROTONIX) 40 MG tablet Take 1 tablet (40 mg total) by mouth  daily.   prochlorperazine (COMPAZINE) 10 MG tablet Take 1 tablet (10 mg total) by mouth every 6 (six) hours as needed for nausea or vomiting.   senna-docusate (SENOKOT-S) 8.6-50 MG tablet Take 1 tablet by mouth 2 (two) times daily.   tamsulosin (FLOMAX) 0.4 MG CAPS capsule Take 1 capsule (0.4 mg total) by mouth daily after supper.   thiamine 100 MG tablet Take 1 tablet (100 mg total) by mouth daily.   No facility-administered encounter medications on file as of 06/17/2021.     ---------------------------------------------------------------------------------------------------------------------------------------------------------------------------------------------------------------- Advance Care Planning/Goals of Care: Goals include to maximize quality of life and symptom management. Patient gave his permission to discuss.Our advance care planning conversation included a discussion about:    The value and importance of advance care planning  Exploration of personal, cultural  or spiritual beliefs that might influence medical decisions-wants to go home to be independent but currently unable to ambulate and does not have 24/7 caregiver CODE STATUS: Desires full code status      Thank you for the opportunity to participate in the care of Mr. Cowgill.  The palliative care team will continue to follow. Please call our office at [phone] if we can be of additional assistance.   Marijo Conception, FNP-C  COVID-19 PATIENT SCREENING TOOL Asked and negative response unless otherwise noted:  Have you had symptoms of covid, tested positive or been in contact with someone with symptoms/positive test in the past 5-10 days?  No

## 2021-06-20 ENCOUNTER — Encounter: Payer: Self-pay | Admitting: Internal Medicine

## 2021-06-20 ENCOUNTER — Telehealth: Payer: Self-pay | Admitting: Medical Oncology

## 2021-06-20 ENCOUNTER — Encounter: Payer: Self-pay | Admitting: *Deleted

## 2021-06-20 NOTE — Progress Notes (Signed)
Oncology Nurse Navigator Documentation  Oncology Nurse Navigator Flowsheets 06/20/2021 06/03/2021 06/02/2021 06/01/2021 05/17/2021 05/13/2021 05/10/2021  Abnormal Finding Date - - - - - - -  Confirmed Diagnosis Date - - - - - - -  Diagnosis Status - - Confirmed Diagnosis Complete - - - -  Planned Course of Treatment - - Chemotherapy;Radiation - - - -  Phase of Treatment - - Radiation - - - -  Radiation Actual Start Date: - - 04/27/2021 - - - -  Radiation Actual End Date: - - 05/10/2021 - - - -  Navigator Follow Up Date: - 06/14/2021 06/03/2021 06/02/2021 05/23/2021 05/17/2021 -  Navigator Follow Up Reason: - Patient Call Radiology New Patient Appointment Appointment Review New Patient Appointment -  Navigator Location [place]  Referral Date to RadOnc/MedOnc - - - - - - -  Navigator Encounter Type Other: Telephone Clinic/MDC;Initial MedOnc Telephone Other: Other: Other:  Telephone - Outgoing Call - Outgoing Call - - -  Treatment Initiated Date - - 04/27/2021 - - - -  Patient Visit Type - Other Initial;MedOnc Other Other Inpatient Inpatient  Treatment Phase Pre-Tx/Tx Discussion Other Other Pre-Tx/Tx Discussion - - -  Barriers/Navigation Needs Coordination of Care Coordination of Care;Education Education Education;Coordination of Care Coordination of Care Coordination of Care Coordination of Care  Education - Other Other Other - - -  Interventions Coordination of Care/I received a message that patient needs morning appts for tx. I completed an urgent scheduling message to cancel current appts and re-schedule to morning appts per his transportation issues.  Coordination of Care;Education Education Coordination of Care;Education Coordination of Care Coordination of Care Coordination of Care  Acuity Level 2-Minimal Needs (1-2 Barriers Identified) Level 3-Moderate Needs (3-4 Barriers Identified) Level 2-Minimal  Needs (1-2 Barriers Identified) Level 2-Minimal Needs (1-2 Barriers Identified) Level 2-Minimal Needs (1-2 Barriers Identified) Level 2-Minimal Needs (1-2 Barriers Identified) Level 2-Minimal Needs (1-2 Barriers Identified)  Coordination of Care Other Appts - Other - Appts Appts  Education Method - Verbal Verbal Verbal - - -  Time Spent with Patient 30 30 15 30  - 30 30

## 2021-06-20 NOTE — Progress Notes (Signed)
Oncology Nurse Navigator Documentation  Oncology Nurse Navigator Flowsheets 06/20/2021 06/20/2021 06/03/2021 06/02/2021 06/01/2021 05/17/2021 05/13/2021  Abnormal Finding Date - - - - - - -  Confirmed Diagnosis Date - - - - - - -  Diagnosis Status - - - Confirmed Diagnosis Complete - - -  Planned Course of Treatment - - - Chemotherapy;Radiation - - -  Phase of Treatment - - - Radiation - - -  Radiation Actual Start Date: - - - 04/27/2021 - - -  Radiation Actual End Date: - - - 05/10/2021 - - -  Navigator Follow Up Date: - - 06/14/2021 06/03/2021 06/02/2021 05/23/2021 05/17/2021  Navigator Follow Up Reason: - - Patient Call Radiology New Patient Appointment Appointment Review New Patient Appointment  Navigator Location [place]  Referral Date to RadOnc/MedOnc - - - - - - -  Navigator Encounter Type Letter/Fax/Email/I received a message from scheduling that Christopher Dawson appt have been changed. I called [name] and updated Christopher Dawson with transportation. She requested I fax the updated schedule. I did with confirmation fax back.  Other: Telephone Clinic/MDC;Initial MedOnc Telephone Other: Other:  Telephone - - Outgoing Call - Outgoing Call - -  Treatment Initiated Date - - - 04/27/2021 - - -  Patient Visit Type - - Other Initial;MedOnc Other Other Inpatient  Treatment Phase - Pre-Tx/Tx Discussion Other Other Pre-Tx/Tx Discussion - -  Barriers/Navigation Needs Coordination of Care Coordination of Care Coordination of Care;Education Education Education;Coordination of Care Coordination of Care Coordination of Care  Education - - Other Other Other - -  Interventions Coordination of Care Coordination of Care Coordination of Care;Education Education Coordination of Care;Education Coordination of Care Coordination of Care  Acuity Level 2-Minimal Needs (1-2 Barriers Identified) Level 2-Minimal Needs (1-2 Barriers  Identified) Level 3-Moderate Needs (3-4 Barriers Identified) Level 2-Minimal Needs (1-2 Barriers Identified) Level 2-Minimal Needs (1-2 Barriers Identified) Level 2-Minimal Needs (1-2 Barriers Identified) Level 2-Minimal Needs (1-2 Barriers Identified)  Coordination of Care Appts Other Appts - Other - Appts  Education Method - - Verbal Verbal Verbal - -  Time Spent with Patient 30 30 30 15 30  - 30

## 2021-06-20 NOTE — Telephone Encounter (Signed)
Potential schedule conflict with transportation and appts. Dana to f/u.

## 2021-06-20 NOTE — Progress Notes (Signed)
Patient received Vitamin B 12 injection

## 2021-06-21 ENCOUNTER — Telehealth: Payer: Self-pay

## 2021-06-21 ENCOUNTER — Inpatient Hospital Stay: Payer: Medicare Other

## 2021-06-21 NOTE — Telephone Encounter (Signed)
Called each number on file in attempts to remind patient of his 9:00am-06/30/21 telephone appointment w/ Ashlyn Bruning PA-C. There was no answer, and no voicemail option. I will attempt another call closer to appointment date.

## 2021-06-22 ENCOUNTER — Telehealth: Payer: Self-pay

## 2021-06-22 ENCOUNTER — Ambulatory Visit: Payer: Medicare Other

## 2021-06-22 ENCOUNTER — Telehealth: Payer: Self-pay | Admitting: *Deleted

## 2021-06-22 ENCOUNTER — Encounter: Payer: Self-pay | Admitting: Urology

## 2021-06-22 NOTE — Telephone Encounter (Signed)
Attempted to reach patient again through each number on file, in attempts to remind him of his 9:00am-06/30/21 telephone appointment w/ Ashlyn Bruning PA-C. There was no answer, and no voicemail option. I will attempt another call closer to appointment date. ?

## 2021-06-22 NOTE — Telephone Encounter (Signed)
Spoke with Suanne Marker at [employer] to see if we could do a telephone call with pt to do his education rather than having pt come by ambulance to class. Have tried several times to reach pt but unable to leave a message since voice mail not set up.  She verified with pt & he states he will answer his phone.  Will fax over pt's calendar to [place] @ [phone] ?

## 2021-06-22 NOTE — Progress Notes (Signed)
Spoke w/ patient, verified identity, and began nursing interview. Patient reports overall joint/ muscle pain 7/10, fatigue, and occasional dizziness upon standing. No other issues reported at this time. ? ?Meaningful use complete. ? ?Patient reminded of his 10:00am-06/30/21 telephone appointment w/ Ashlyn Bruning PA-C. I left my extension [phone] in case patient needs anything. Patient verbalized understanding of information. ? ?Patient contact [phone] ?

## 2021-06-23 ENCOUNTER — Telehealth: Payer: Self-pay | Admitting: Internal Medicine

## 2021-06-23 ENCOUNTER — Other Ambulatory Visit: Payer: Medicare Other

## 2021-06-23 NOTE — Telephone Encounter (Signed)
Scheduled per 02/23 los, patient has been called and notified of all upcoming appointments. ?

## 2021-06-24 ENCOUNTER — Inpatient Hospital Stay: Payer: Medicare Other | Attending: Physician Assistant

## 2021-06-24 ENCOUNTER — Encounter: Payer: Self-pay | Admitting: Internal Medicine

## 2021-06-24 ENCOUNTER — Other Ambulatory Visit: Payer: Self-pay

## 2021-06-24 DIAGNOSIS — Z5112 Encounter for antineoplastic immunotherapy: Secondary | ICD-10-CM | POA: Insufficient documentation

## 2021-06-24 DIAGNOSIS — R5383 Other fatigue: Secondary | ICD-10-CM | POA: Insufficient documentation

## 2021-06-24 DIAGNOSIS — C3412 Malignant neoplasm of upper lobe, left bronchus or lung: Secondary | ICD-10-CM | POA: Insufficient documentation

## 2021-06-24 DIAGNOSIS — Z5111 Encounter for antineoplastic chemotherapy: Secondary | ICD-10-CM | POA: Insufficient documentation

## 2021-06-24 DIAGNOSIS — E538 Deficiency of other specified B group vitamins: Secondary | ICD-10-CM | POA: Insufficient documentation

## 2021-06-24 DIAGNOSIS — C7951 Secondary malignant neoplasm of bone: Secondary | ICD-10-CM | POA: Insufficient documentation

## 2021-06-24 DIAGNOSIS — Z923 Personal history of irradiation: Secondary | ICD-10-CM | POA: Insufficient documentation

## 2021-06-24 DIAGNOSIS — D6481 Anemia due to antineoplastic chemotherapy: Secondary | ICD-10-CM | POA: Insufficient documentation

## 2021-06-27 ENCOUNTER — Inpatient Hospital Stay: Payer: Medicare Other

## 2021-06-27 ENCOUNTER — Inpatient Hospital Stay: Payer: Medicare Other | Admitting: Internal Medicine

## 2021-06-27 ENCOUNTER — Encounter: Payer: Self-pay | Admitting: Family Medicine

## 2021-06-27 ENCOUNTER — Other Ambulatory Visit: Payer: Self-pay | Admitting: Medical Oncology

## 2021-06-27 ENCOUNTER — Other Ambulatory Visit: Payer: Self-pay | Admitting: Internal Medicine

## 2021-06-27 ENCOUNTER — Telehealth: Payer: Self-pay | Admitting: Medical Oncology

## 2021-06-27 ENCOUNTER — Ambulatory Visit: Payer: Medicare Other

## 2021-06-27 ENCOUNTER — Encounter: Payer: Self-pay | Admitting: *Deleted

## 2021-06-27 ENCOUNTER — Non-Acute Institutional Stay: Payer: Medicare Other | Admitting: Family Medicine

## 2021-06-27 ENCOUNTER — Other Ambulatory Visit: Payer: Self-pay

## 2021-06-27 VITALS — BP 110/70 | HR 110 | Temp 98.8°F | Resp 18

## 2021-06-27 DIAGNOSIS — C7951 Secondary malignant neoplasm of bone: Secondary | ICD-10-CM

## 2021-06-27 DIAGNOSIS — K5900 Constipation, unspecified: Secondary | ICD-10-CM

## 2021-06-27 DIAGNOSIS — R051 Acute cough: Secondary | ICD-10-CM

## 2021-06-27 DIAGNOSIS — G893 Neoplasm related pain (acute) (chronic): Secondary | ICD-10-CM

## 2021-06-27 DIAGNOSIS — C3492 Malignant neoplasm of unspecified part of left bronchus or lung: Secondary | ICD-10-CM

## 2021-06-27 DIAGNOSIS — Z515 Encounter for palliative care: Secondary | ICD-10-CM

## 2021-06-27 DIAGNOSIS — Z7189 Other specified counseling: Secondary | ICD-10-CM | POA: Insufficient documentation

## 2021-06-27 NOTE — Telephone Encounter (Signed)
Lab orders received and will be drawn tomorrow and nurse will fax results on Wednesday morning. ?

## 2021-06-27 NOTE — Progress Notes (Signed)
Oncology Nurse Navigator Documentation ? ?Oncology Nurse Navigator Flowsheets 06/27/2021 06/20/2021 06/20/2021 06/03/2021 06/02/2021 06/01/2021 05/17/2021  ?Abnormal Finding Date - - - - - - -  ?Confirmed Diagnosis Date - - - - - - -  ?Diagnosis Status - - - - Confirmed Diagnosis Complete - -  ?Planned Course of Treatment - - - - Chemotherapy;Radiation - -  ?Phase of Treatment - - - - Radiation - -  ?Radiation Actual Start Date: - - - - 04/27/2021 - -  ?Radiation Actual End Date: - - - - 05/10/2021 - -  ?Navigator Follow Up Date: 06/30/2021 - - 06/14/2021 06/03/2021 06/02/2021 05/23/2021  ?Navigator Follow Up Reason: Follow-up Appointment - - Patient Call Radiology New Patient Appointment Appointment Review  ?Nav[facility]  ?Referral Date to RadOnc/MedOnc - - - - - - -  ?Navigator Encounter Type Telephone Letter/Fax/Email Other: Telephone Clinic/MDC;Initial MedOnc Telephone Other:  ?Telephone Outgoing Call - - Outgoing Call - Outgoing Call -  ?Treatment Initiated Date - - - - 04/27/2021 - -  ?Patient Visit Type - - - Other Initial;MedOnc Other Other  ?Treatment Phase - - Pre-Tx/Tx Discussion Other Other Pre-Tx/Tx Discussion -  ?Barriers/Navigation Needs Coordination of Care/Christopher Dawson was a no show to his appt today. I called his transportation service at [employer] and spoke to Christopher Dawson. She states she was not aware of appt. I have spoke to Christopher Dawson last week to update her on this appt today and fax his appt calendar to her. I gave her another appt this week for him to see Dr. Julien Nordmann. She verbalized understanding.  Coordination of Care Coordination of Care Coordination of Care;Education Education Education;Coordination of Care Coordination of Care  ?Education - - - Other Other Other -  ?Interventions Coordination of Care Coordination of Care Coordination of Care Coordination of Care;Education Education Coordination  of Care;Education Coordination of Care  ?Acuity Level 2-Minimal Needs (1-2 Barriers Identified) Level 2-Minimal Needs (1-2 Barriers Identified) Level 2-Minimal Needs (1-2 Barriers Identified) Level 3-Moderate Needs (3-4 Barriers Identified) Level 2-Minimal Needs (1-2 Barriers Identified) Level 2-Minimal Needs (1-2 Barriers Identified) Level 2-Minimal Needs (1-2 Barriers Identified)  ?Coordination of Care Appts Appts Other Appts - Other -  ?Education Method - - - Verbal Verbal Verbal -  ?Time Spent with Patient 30 30 30 30 15 30  -  ?  ?

## 2021-06-27 NOTE — Progress Notes (Signed)
? ? ?Manufacturing engineer ?[facility] Consult Note ?Telephone: [phone]  ?Fax: [phone]  ? ? ?Date of encounter: 06/27/21 ?12:30 PM ?PATIENT NAME: Christopher Dawson ?[place] [id number]   ?[id number] (home)  ?DOB: July 08, 1955 ?MRN: [id number] ?PRIMARY CARE PROVIDER:    ?[name], MD,  ?[place] ?[place] [address] ?[phone] ? ?REFERRING PROVIDER:   ?[name], MD ?[address] ?[place],  [place] [address] ?[phone] ? ?RESPONSIBLE PARTY:    ?Contact Information   ? ? Name Relation Home Work Mobile  ? Delvecchio,Lou Mother [phone]    ? ?  ? ? ? ?I met face to face with patient in [name] skilled facility. Palliative Care was asked to follow this patient by consultation request of  [name],* to address advance care planning and complex medical decision making. This is a follow up visit. ? ?                                 ASSESSMENT, SYMPTOM MANAGEMENT AND PLAN / RECOMMENDATIONS:  ? Cancer related pain ?Improved pain in low back, radiation not effective in controlling pain. ?Increase Fentanyl to 25 mcg patch Q 3 days. ?Continue OxyCodone 5/325 mg Q 4 hrs prn for breakthrough pain. ? ?2.   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 ?Stat portable CXR and have results called to Palliative provider, left number for call back. ?CBC and CMP were ordered stat by Oncology Navigator and results to be faxed. ? ?3.    Constipation ?Secondary to poor mobility/intake and opiods ?Lactulose 20 gm daily ? ?4.  Palliative Care Encounter ?Continue to follow as care evolves for advance directive, code status and goals of care with education regarding options. ? ? ?Advance Care Planning/Goals of Care:  ? ?CODE STATUS: ?Limited Full Code without intubation per Oncology NP ? ? ? ? ?Follow up Palliative Care Visit: Palliative care  will continue to follow for complex medical decision making, advance care planning, and clarification of goals. Return 2 weeks or prn. ? ? ?This visit was coded based on medical decision making (MDM). ? ?PPS: 40% ? ?HOSPICE ELIGIBILITY/DIAGNOSIS: TBD ? ?Chief Complaint:  ?Follow up of low back pain due to metastasis.  Pt c/o cough with exposure to roommate with cough, constipation and poor sleep.  ? ?HISTORY OF PRESENT ILLNESS:  Christopher Dawson is a 66 y.o. 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  He has a right adrenal mass and constipation with a hx of alcohol abuse, Marfan Syndrome.  Pt was recently started on Fentanyl 12 mcg patch with OxyCodone 5/325 mg po Q 4 hrs prn breakthrough pain.  Nursing reports that pt is having to take some prn pain meds maybe once or twice a day.  Pt reports pain has improved and rates a 4-5 on 10 scale at best. Pt states he has had a cough for 4 days now, mostly non-productive, denies fever, chills or worsening SOB.  States his peer roommate was recently sick with same symptoms.  He is having to strain for long periods of time to have a BM, having had one overnight.  Reports poor sleep due to "activity" in  the facility at night with no improvement with Melatonin.   ? ?History obtained from review of EMR, discussion with facility staff and/or Mr. Lenker.  ?I reviewed available labs, medications, imaging, studies and related documents from the EMR.  Records reviewed and summarized above.  ? ?ROS ? ?General: NAD ?EYES: denies vision changes ?ENMT: denies dysphagia ?Cardiovascular: denies chest pain, endorses some DOE ?Pulmonary: non-productive cough, denies increased SOB ?Abdomen: endorses good appetite, endorses constipation and straining, endorses continence of bowel ?GU: has foley cath with yellow urine and rust colored sediment in  tube ?MSK:  denies increased weakness,  no falls reported ?Skin: denies rashes or wounds ?Neurological: endorses improved pain control, endorses insomnia ?Psych: Endorses positive mood ?Heme/lymph/immuno: denies bruises, abnormal bleeding ? ?Physical Exam: ?Constitutional: NAD ?General: Unkempt, frail appearing, thin ?EYES: anicteric sclera, lids intact, no discharge  ?ENMT: intact hearing, oral mucous membranes moist, dentition intact ?CV: S1S2, tachycardic regular rate, no LE edema ?Pulmonary: Expiratory wheeze in LUL, fine crackles LLL, no increased work of breathing, + non-productive cough, room air ?Abdomen: normo-active BS+ 4 quadrants, soft and non tender, no ascites ?GU: deferred ?MSK: no sarcopenia, moves all extremities, bedbound ?Skin: warm and dry, no rashes. Severe onychomychosis of bilateral toes of bilateral feet ?Neuro:  noted generalized weakness,  short term memory deficits ?Psych: non-anxious affect, A and O x 3 ?Hem/lymph/immuno: no widespread bruising ? ? ?Thank you for the opportunity to participate in the care of Mr. Force.  The palliative care team will continue to follow. Please call our office at [phone] if we can be of additional assistance.  ? ?Marijo Conception, FNP -C ? ?COVID-19 PATIENT SCREENING TOOL ?Asked and negative response unless otherwise noted:  ? ?Have you had symptoms of covid, tested positive or been in contact with someone with symptoms/positive test in the past 5-10 days? unknown ? ?

## 2021-06-27 NOTE — Telephone Encounter (Signed)
Appts Thursday -Lab orders faxed to [facility] to be drawn today or tomorrow as stat  and instructed to fax results to [phone]( POD 3). If we get these results before thursday then he will not need a lab ?appt at the cancer center on Thursday morning. ? ?Lab appt left on the schedule in case we do not get his labs. ?

## 2021-06-29 ENCOUNTER — Encounter: Payer: Self-pay | Admitting: Internal Medicine

## 2021-06-29 ENCOUNTER — Other Ambulatory Visit: Payer: Self-pay

## 2021-06-29 ENCOUNTER — Other Ambulatory Visit: Payer: Medicare Other

## 2021-06-29 ENCOUNTER — Non-Acute Institutional Stay: Payer: Medicare Other

## 2021-06-29 ENCOUNTER — Ambulatory Visit: Payer: Medicare Other | Admitting: Physician Assistant

## 2021-06-29 DIAGNOSIS — Z515 Encounter for palliative care: Secondary | ICD-10-CM

## 2021-06-29 MED FILL — Dexamethasone Sodium Phosphate Inj 100 MG/10ML: INTRAMUSCULAR | Qty: 1 | Status: AC

## 2021-06-29 MED FILL — Fosaprepitant Dimeglumine For IV Infusion 150 MG (Base Eq): INTRAVENOUS | Qty: 5 | Status: AC

## 2021-06-30 ENCOUNTER — Inpatient Hospital Stay (HOSPITAL_BASED_OUTPATIENT_CLINIC_OR_DEPARTMENT_OTHER): Payer: Medicare Other | Admitting: Internal Medicine

## 2021-06-30 ENCOUNTER — Ambulatory Visit
Admission: RE | Admit: 2021-06-30 | Discharge: 2021-06-30 | Disposition: A | Discharge status: Home / Self Care | Payer: Medicare Other | Source: Ambulatory Visit | Attending: Urology | Admitting: Urology

## 2021-06-30 ENCOUNTER — Inpatient Hospital Stay: Payer: Medicare Other

## 2021-06-30 ENCOUNTER — Encounter: Payer: Self-pay | Admitting: Emergency Medicine

## 2021-06-30 VITALS — BP 99/60 | HR 108 | Temp 98.1°F | Resp 18

## 2021-06-30 DIAGNOSIS — Z5112 Encounter for antineoplastic immunotherapy: Secondary | ICD-10-CM

## 2021-06-30 DIAGNOSIS — C3492 Malignant neoplasm of unspecified part of left bronchus or lung: Secondary | ICD-10-CM

## 2021-06-30 DIAGNOSIS — E538 Deficiency of other specified B group vitamins: Secondary | ICD-10-CM | POA: Diagnosis not present

## 2021-06-30 DIAGNOSIS — R5383 Other fatigue: Secondary | ICD-10-CM | POA: Diagnosis not present

## 2021-06-30 DIAGNOSIS — C7951 Secondary malignant neoplasm of bone: Secondary | ICD-10-CM | POA: Diagnosis not present

## 2021-06-30 DIAGNOSIS — C3412 Malignant neoplasm of upper lobe, left bronchus or lung: Secondary | ICD-10-CM | POA: Diagnosis present

## 2021-06-30 DIAGNOSIS — Z923 Personal history of irradiation: Secondary | ICD-10-CM | POA: Diagnosis not present

## 2021-06-30 DIAGNOSIS — D6481 Anemia due to antineoplastic chemotherapy: Secondary | ICD-10-CM | POA: Diagnosis not present

## 2021-06-30 DIAGNOSIS — Z5111 Encounter for antineoplastic chemotherapy: Secondary | ICD-10-CM

## 2021-06-30 MED ORDER — SODIUM CHLORIDE 0.9 % IV SOLN
200.0000 mg | Freq: Once | INTRAVENOUS | Status: AC
  Administered 2021-06-30: 10:00:00 200 mg via INTRAVENOUS
  Filled 2021-06-30: qty 200

## 2021-06-30 MED ORDER — SODIUM CHLORIDE 0.9 % IV SOLN
150.0000 mg | Freq: Once | INTRAVENOUS | Status: AC
  Administered 2021-06-30: 10:00:00 150 mg via INTRAVENOUS
  Filled 2021-06-30: qty 150

## 2021-06-30 MED ORDER — SODIUM CHLORIDE 0.9 % IV SOLN
10.0000 mg | Freq: Once | INTRAVENOUS | Status: AC
  Administered 2021-06-30: 09:00:00 10 mg via INTRAVENOUS
  Filled 2021-06-30: qty 10

## 2021-06-30 MED ORDER — PALONOSETRON HCL INJECTION 0.25 MG/5ML
0.2500 mg | Freq: Once | INTRAVENOUS | Status: AC
  Administered 2021-06-30: 09:00:00 0.25 mg via INTRAVENOUS
  Filled 2021-06-30: qty 5

## 2021-06-30 MED ORDER — SODIUM CHLORIDE 0.9 % IV SOLN
510.0000 mg | Freq: Once | INTRAVENOUS | Status: AC
  Administered 2021-06-30: 11:00:00 510 mg via INTRAVENOUS
  Filled 2021-06-30: qty 51

## 2021-06-30 MED ORDER — SODIUM CHLORIDE 0.9 % IV SOLN: Freq: Once | INTRAVENOUS | Status: AC

## 2021-06-30 MED ORDER — SODIUM CHLORIDE 0.9 % IV SOLN
500.0000 mg/m2 | Freq: Once | INTRAVENOUS | Status: AC
  Administered 2021-06-30: 11:00:00 1000 mg via INTRAVENOUS
  Filled 2021-06-30: qty 40

## 2021-06-30 NOTE — Progress Notes (Signed)
[facility] [phone]   [phone]  OFFICE PROGRESS NOTE  [name], MD [place] [address]  DIAGNOSIS: Stage IV (T1c, N1, M1 C) non-small cell lung cancer, adenosquamous carcinoma presented with left upper lobe lung nodule in addition to left hilar adenopathy and bone metastasis diagnosed January 2023.  Molecular studies by [facility] 1 showed positive KRAS G12C mutation  PD-L1 expression 10%  PRIOR THERAPY: Palliative radiotherapy to the T12 destructive pathologic fracture under the care of Dr. Tammi Klippel completed on May 10, 2021.  CURRENT THERAPY: Systemic chemotherapy with carboplatin for AUC of 5, Alimta 500 Mg/M2 and Keytruda 200 Mg IV every 3 weeks.  First dose June 30, 2021.  INTERVAL HISTORY: Christopher Dawson 66 y.o. male who was seen at the chemotherapy infusion room today.  The patient came with EMS on a stretcher because of his inability to walk and significant weakness in the lower extremities.  He is currently on a skilled nursing facility and undergoing rehabilitation.  He denied having any current chest pain, shortness of breath, cough or hemoptysis.  He denied having any nausea, vomiting, diarrhea or constipation.  He has no headache or visual changes.  He denied having any fever or chills.  He is here today for evaluation before starting the first cycle of his treatment.  He had a PET scan performed since his last visit.  MEDICAL HISTORY: Past Medical History:  Diagnosis Date   Anxiety    Depression    Lumbar radiculopathy, chronic 2008   Marfan syndrome    Neurosis, depressive    Shortening, leg, congenital     ALLERGIES:  is allergic to codeine.  MEDICATIONS:  Current Outpatient Medications  Medication Sig Dispense Refill   acetaminophen (TYLENOL) 325 MG tablet Take 2 tablets (650 mg total) by mouth every 6 (six) hours as needed for mild pain (or Fever >/= 101). 30 tablet 0    bisacodyl (DULCOLAX) 5 MG EC tablet Take 1 tablet (5 mg total) by mouth daily. 30 tablet 0   clomiPRAMINE (ANAFRANIL) 25 MG capsule Take 4 capsules (100 mg total) by mouth at bedtime. As needed. 30 capsule 2   feeding supplement (ENSURE ENLIVE / ENSURE PLUS) LIQD Take 237 mLs by mouth 3 (three) times daily between meals. 237 mL 12   fentaNYL (DURAGESIC) 25 MCG/HR Place 1 patch onto the skin every 3 (three) days.     folic acid (FOLVITE) 1 MG tablet Take 1 tablet (1 mg total) by mouth daily. 30 tablet 4   gabapentin (NEURONTIN) 100 MG capsule Take 1 capsule (100 mg total) by mouth 3 (three) times daily. 90 capsule 0   hydrOXYzine (ATARAX) 25 MG tablet Take 1 tablet (25 mg total) by mouth 3 (three) times daily as needed for anxiety. 30 tablet 0   lactulose (CHRONULAC) 10 GM/15ML solution Take 45 mLs (30 g total) by mouth 2 (two) times daily. 236 mL 0   Multiple Vitamin (MULTIVITAMIN WITH MINERALS) TABS tablet Take 1 tablet by mouth daily. 30 tablet 0   Multiple Vitamins-Minerals (DECUBI-VITE) CAPS Take by mouth.     nicotine (NICODERM CQ - DOSED IN MG/24 HOURS) 14 mg/24hr patch Place 1 patch (14 mg total) onto the skin daily. 28 patch 0   ondansetron (ZOFRAN) 4 MG tablet Take 1 tablet (4 mg total) by mouth every 6 (six) hours as needed for nausea. 20 tablet 0   oxyCODONE (OXY IR/ROXICODONE) 5 MG immediate release  tablet Take 1-2 tablets (5-10 mg total) by mouth every 4 (four) hours as needed for moderate pain. 10 tablet 0   pantoprazole (PROTONIX) 40 MG tablet Take 1 tablet (40 mg total) by mouth daily. 30 tablet 0   prochlorperazine (COMPAZINE) 10 MG tablet Take 1 tablet (10 mg total) by mouth every 6 (six) hours as needed for nausea or vomiting. 30 tablet 0   senna-docusate (SENOKOT-S) 8.6-50 MG tablet Take 1 tablet by mouth 2 (two) times daily. 30 tablet 0   tamsulosin (FLOMAX) 0.4 MG CAPS capsule Take 1 capsule (0.4 mg total) by mouth daily after supper. 30 capsule 0   thiamine 100 MG tablet Take  1 tablet (100 mg total) by mouth daily. 30 tablet 0   No current facility-administered medications for this visit.    SURGICAL HISTORY:  Past Surgical History:  Procedure Laterality Date   LEG SURGERY      REVIEW OF SYSTEMS:  Constitutional: positive for fatigue Eyes: negative Ears, nose, mouth, throat, and face: negative Respiratory: negative Cardiovascular: negative Gastrointestinal: negative Genitourinary:negative Integument/breast: negative Hematologic/lymphatic: negative Musculoskeletal:positive for muscle weakness Neurological: positive for weakness Behavioral/Psych: negative Endocrine: negative Allergic/Immunologic: negative   PHYSICAL EXAMINATION: General appearance: alert, cooperative, fatigued, and no distress Head: Normocephalic, without obvious abnormality, atraumatic Neck: no adenopathy, no JVD, supple, symmetrical, trachea midline, and thyroid not enlarged, symmetric, no tenderness/mass/nodules Lymph nodes: Cervical, supraclavicular, and axillary nodes normal. Resp: clear to auscultation bilaterally Back: symmetric, no curvature. ROM normal. No CVA tenderness. Cardio: regular rate and rhythm, S1, S2 normal, no murmur, click, rub or gallop GI: soft, non-tender; bowel sounds normal; no masses,  no organomegaly Extremities: extremities normal, atraumatic, no cyanosis or edema Neurologic: Motor: Significant weakness in the lower extremities bilaterally.  ECOG PERFORMANCE STATUS: 1 - Symptomatic but completely ambulatory  There were no vitals taken for this visit.  LABORATORY DATA: Lab Results  Component Value Date   WBC 8.0 06/02/2021   HGB 10.2 (L) 06/02/2021   HCT 30.5 (L) 06/02/2021   MCV 87.9 06/02/2021   PLT 526 (H) 06/02/2021      Chemistry      Component Value Date/Time   NA 138 06/02/2021 1231   NA 146 (H) 07/04/2018 1642   K 3.5 06/02/2021 1231   CL 101 06/02/2021 1231   CO2 31 06/02/2021 1231   BUN 17 06/02/2021 1231   BUN 13 07/04/2018  1642   CREATININE 0.61 06/02/2021 1231   CREATININE 0.95 07/04/2012 1534      Component Value Date/Time   CALCIUM 8.5 (L) 06/02/2021 1231   ALKPHOS 105 06/02/2021 1231   AST 53 (H) 06/02/2021 1231   ALT 53 (H) 06/02/2021 1231   BILITOT 0.4 06/02/2021 1231       RADIOGRAPHIC STUDIES: NM PET Image Initial (PI) Skull Base To Thigh  Result Date: 06/18/2021 CLINICAL DATA:  Subsequent treatment strategy for metastatic non-small cell lung cancer. EXAM: NUCLEAR MEDICINE PET SKULL BASE TO THIGH TECHNIQUE: 7.39 mCi F-18 FDG was injected intravenously. Full-ring PET imaging was performed from the skull base to thigh after the radiotracer. CT data was obtained and used for attenuation correction and anatomic localization. Fasting blood glucose: 92 mg/dl COMPARISON:  Above FINDINGS: Mediastinal blood pool activity: SUV max 1.83 Liver activity: SUV max NA NECK: No hypermetabolic neck mass or cervical lymphadenopathy. Incidental CT findings: none CHEST: The left upper lobe pulmonary lesion is slightly larger. It measures 19.5 x 18 mm and previously measured 18 x 16 mm. SUV max is  10.03. The 8 mm prevascular node not show any hypermetabolism. No enlarged or hypermetabolic mediastinal lymph nodes. New diffuse irregular nodular and interstitial process in the lungs with associated patchy hypermetabolism is most likely an inflammatory or infectious process. No discrete pulmonary nodules to suggest pulmonary metastatic disease. Somewhat lobulated density in the left lower lobe on image number 60/8 is not hypermetabolic and is likely inflammation or atelectasis. No supraclavicular or axillary adenopathy. Incidental CT findings: Stable underlying emphysematous changes. ABDOMEN/PELVIS: Curvilinear area of hypermetabolism in the medial aspect of the right hepatic lobe is likely radiation change. No findings for hepatic or adrenal gland metastatic disease. Hypermetabolic focus in the pelvis adjacent to the upper sigmoid  colon is likely an inflamed diverticulum. Hypermetabolic focus associated with the anterior peritoneum on the left side corresponding to a soft tissue lesion measuring approximately 2 cm. This was also present on the prior CT scan where it showed rim enhancement. SUV max is 7.1 a and this is likely a peritoneal metastasis. Incidental CT findings: A Foley catheter is noted in the bladder. Scattered vascular calcifications. SKELETON: Overall stable appearing destructive metastatic disease involving the T12 vertebral body with pathologic fracture. I do not see any progressive findings or progressive canal compromise. The left T12 rib is also destroyed along with the pedicle and part of the lamina and facet on the left side. SUV max is 5.87. Stable lytic metastatic focus involving the left side of T9. No new or progressive changes. SUV max is 6.85 Stable stable appearing lesion in the T5 vertebral body. SUV max is 11.02. Hypermetabolic focus involving the right lateral mass of C1, likely a metastatic focus. SUV max is 7.84. No pathologic fracture. FDG uptake noted in the soft tissues of the posterior pelvis which appear to be associated with early decubitus ulcers. Incidental CT findings: none IMPRESSION: 1. Slight interval enlargement of the left upper lobe lung lesion. 2. New patchy interstitial and airspace process in the lungs likely inflammation or infection. No findings to suggest pulmonary metastatic disease. 3. No hypermetabolic mediastinal or hilar adenopathy. 4. Metastatic bone disease as detailed above. The most significant finding is at T12 with pathologic fracture but no progressive canal compromise. 5. Peritoneal or possible muscle metastasis involving the transversus abdominus muscle on the left. 6. Early decubitus ulcers. Electronically Signed   By: Marijo Sanes M.D.   On: 06/18/2021 10:05    ASSESSMENT AND PLAN: This is a very pleasant 66 years old white male with a stage IV (T1c, M1, M1 C) non-small  cell lung cancer, adenosquamous carcinoma presented with left upper lobe lung nodule in addition to left hilar adenopathy and bone metastasis diagnosed in January 2023 with positive KRAS G12C mutation and PD-L1 expression of 10%. The patient had a PET scan performed after his last visit and that showed slight interval enlargement of the left upper lobe lung lesion with no finding to suggest pulmonary metastatic disease and no hypermetabolic mediastinal or hilar adenopathy.  The scan also showed the metastatic bone disease with the most significant findings at the T12 pathologic fraction with no progressive canal compromise.  He also has peritoneal or possible muscle metastasis involving the transverse abdominis muscle on the left.  I discussed the result with the patient today and recommended for him to proceed with his treatment as planned He is currently on systemic chemotherapy with carboplatin for AUC of 5, Alimta 500 Mg/M2 and Keytruda 200 Mg IV every 3 weeks.  First cycle June 30, 2021. I will  see him back for follow-up visit in 3 weeks for evaluation before starting cycle #2. He will continue to have his blood work performed at the rehab facility to avoid the EMS transportation just for blood work. He was advised to call immediately if he has any other concerning symptoms in the interval. The patient voices understanding of current disease status and treatment options and is in agreement with the current care plan.  All questions were answered. The patient knows to call the clinic with any problems, questions or concerns. We can certainly see the patient much sooner if necessary.  The total time spent in the appointment was 30 minutes.  Disclaimer: This note was dictated with voice recognition software. Similar sounding words can inadvertently be transcribed and may not be corrected upon review.

## 2021-06-30 NOTE — Progress Notes (Signed)
Per MD OK to trt w/ elevated HR today ?

## 2021-06-30 NOTE — Patient Instructions (Signed)
[facility]  Discharge Instructions: Thank you for choosing [facility] to provide your oncology and hematology care.   If you have a lab appointment with the [facility], please go directly to the [facility] and check in at the registration area.   Wear comfortable clothing and clothing appropriate for easy access to any Portacath or PICC line.   We strive to give you quality time with your provider. You may need to reschedule your appointment if you arrive late (15 or more minutes).  Arriving late affects you and other patients whose appointments are after yours.  Also, if you miss three or more appointments without notifying the office, you may be dismissed from the clinic at the providers discretion.      For prescription refill requests, have your pharmacy contact our office and allow 72 hours for refills to be completed.    Today you received the following chemotherapy and/or immunotherapy agents: Pembrolizumab (Keytruda), Pemetrexed (Alimta), Carboplatin    To help prevent nausea and vomiting after your treatment, we encourage you to take your nausea medication as directed.  BELOW ARE SYMPTOMS THAT SHOULD BE REPORTED IMMEDIATELY: *FEVER GREATER THAN 100.4 F (38 C) OR HIGHER *CHILLS OR SWEATING *NAUSEA AND VOMITING THAT IS NOT CONTROLLED WITH YOUR NAUSEA MEDICATION *UNUSUAL SHORTNESS OF BREATH *UNUSUAL BRUISING OR BLEEDING *URINARY PROBLEMS (pain or burning when urinating, or frequent urination) *BOWEL PROBLEMS (unusual diarrhea, constipation, pain near the anus) TENDERNESS IN MOUTH AND THROAT WITH OR WITHOUT PRESENCE OF ULCERS (sore throat, sores in mouth, or a toothache) UNUSUAL RASH, SWELLING OR PAIN  UNUSUAL VAGINAL DISCHARGE OR ITCHING   Items with * indicate a potential emergency and should be followed up as soon as possible or go to the Emergency Department if any problems should occur.  Please show the CHEMOTHERAPY ALERT  CARD or IMMUNOTHERAPY ALERT CARD at check-in to the Emergency Department and triage nurse.  Should you have questions after your visit or need to cancel or reschedule your appointment, please contact [facility]  Dept: [phone]  and follow the prompts.  Office hours are 8:00 a.m. to 4:30 p.m. Monday - Friday. Please note that voicemails left after 4:00 p.m. may not be returned until the following business day.  We are closed weekends and major holidays. You have access to a nurse at all times for urgent questions. Please call the main number to the clinic Dept: [phone] and follow the prompts.   For any non-urgent questions, you may also contact your provider using MyChart. We now offer e-Visits for anyone 25 and older to request care online for non-urgent symptoms. For details visit [email or web address].   Also download the MyChart app! Go to the app store, search "MyChart", open the app, select [facility], and log in with your MyChart username and password.  Due to Covid, a mask is required upon entering the hospital/clinic. If you do not have a mask, one will be given to you upon arrival. For doctor visits, patients may have 1 support person aged 3 or older with them. For treatment visits, patients cannot have anyone with them due to current Covid guidelines and our immunocompromised population.   Pembrolizumab injection What is this medication? PEMBROLIZUMAB (pem broe liz ue mab) is a monoclonal antibody. It is used to treat certain types of cancer. This medicine may be used for other purposes; ask your health care provider or pharmacist if you have questions. COMMON BRAND NAME(S):  Keytruda What should I tell my care team before I take this medication? They need to know if you have any of these conditions: autoimmune diseases like Crohn's disease, ulcerative colitis, or lupus have had or planning to have an allogeneic stem cell transplant (uses  someone else's stem cells) history of organ transplant history of chest radiation nervous system problems like myasthenia gravis or Guillain-Barre syndrome an unusual or allergic reaction to pembrolizumab, other medicines, foods, dyes, or preservatives pregnant or trying to get pregnant breast-feeding How should I use this medication? This medicine is for infusion into a vein. It is given by a health care professional in a hospital or clinic setting. A special MedGuide will be given to you before each treatment. Be sure to read this information carefully each time. Talk to your pediatrician regarding the use of this medicine in children. While this drug may be prescribed for children as young as 6 months for selected conditions, precautions do apply. Overdosage: If you think you have taken too much of this medicine contact a poison control center or emergency room at once. NOTE: This medicine is only for you. Do not share this medicine with others. What if I miss a dose? It is important not to miss your dose. Call your doctor or health care professional if you are unable to keep an appointment. What may interact with this medication? Interactions have not been studied. This list may not describe all possible interactions. Give your health care provider a list of all the medicines, herbs, non-prescription drugs, or dietary supplements you use. Also tell them if you smoke, drink alcohol, or use illegal drugs. Some items may interact with your medicine. What should I watch for while using this medication? Your condition will be monitored carefully while you are receiving this medicine. You may need blood work done while you are taking this medicine. Do not become pregnant while taking this medicine or for 4 months after stopping it. Women should inform their doctor if they wish to become pregnant or think they might be pregnant. There is a potential for serious side effects to an unborn child. Talk  to your health care professional or pharmacist for more information. Do not breast-feed an infant while taking this medicine or for 4 months after the last dose. What side effects may I notice from receiving this medication? Side effects that you should report to your doctor or health care professional as soon as possible: allergic reactions like skin rash, itching or hives, swelling of the face, lips, or tongue bloody or black, tarry breathing problems changes in vision chest pain chills confusion constipation cough diarrhea dizziness or feeling faint or lightheaded fast or irregular heartbeat fever flushing joint pain low blood counts - this medicine may decrease the number of white blood cells, red blood cells and platelets. You may be at increased risk for infections and bleeding. muscle pain muscle weakness pain, tingling, numbness in the hands or feet persistent headache redness, blistering, peeling or loosening of the skin, including inside the mouth signs and symptoms of high blood sugar such as dizziness; dry mouth; dry skin; fruity breath; nausea; stomach pain; increased hunger or thirst; increased urination signs and symptoms of kidney injury like trouble passing urine or change in the amount of urine signs and symptoms of liver injury like dark urine, light-colored stools, loss of appetite, nausea, right upper belly pain, yellowing of the eyes or skin sweating swollen lymph nodes weight loss Side effects that usually do not  require medical attention (report to your doctor or health care professional if they continue or are bothersome): decreased appetite hair loss tiredness This list may not describe all possible side effects. Call your doctor for medical advice about side effects. You may report side effects to FDA at 1-800-FDA-1088. Where should I keep my medication? This drug is given in a hospital or clinic and will not be stored at home. NOTE: This sheet is a  summary. It may not cover all possible information. If you have questions about this medicine, talk to your doctor, pharmacist, or health care provider.  2022 Elsevier/Gold Standard (2020-12-28 00:00:00)  Pemetrexed injection What is this medication? PEMETREXED (PEM e TREX ed) is a chemotherapy drug used to treat lung cancers like non-small cell lung cancer and mesothelioma. It may also be used to treat other cancers. This medicine may be used for other purposes; ask your health care provider or pharmacist if you have questions. COMMON BRAND NAME(S): Alimta, PEMFEXY What should I tell my care team before I take this medication? They need to know if you have any of these conditions: infection (especially a virus infection such as chickenpox, cold sores, or herpes) kidney disease low blood counts, like low white cell, platelet, or red cell counts lung or breathing disease, like asthma radiation therapy an unusual or allergic reaction to pemetrexed, other medicines, foods, dyes, or preservative pregnant or trying to get pregnant breast-feeding How should I use this medication? This drug is given as an infusion into a vein. It is administered in a hospital or clinic by a specially trained health care professional. Talk to your pediatrician regarding the use of this medicine in children. Special care may be needed. Overdosage: If you think you have taken too much of this medicine contact a poison control center or emergency room at once. NOTE: This medicine is only for you. Do not share this medicine with others. What if I miss a dose? It is important not to miss your dose. Call your doctor or health care professional if you are unable to keep an appointment. What may interact with this medication? This medicine may interact with the following medications: Ibuprofen This list may not describe all possible interactions. Give your health care provider a list of all the medicines, herbs,  non-prescription drugs, or dietary supplements you use. Also tell them if you smoke, drink alcohol, or use illegal drugs. Some items may interact with your medicine. What should I watch for while using this medication? Visit your doctor for checks on your progress. This drug may make you feel generally unwell. This is not uncommon, as chemotherapy can affect healthy cells as well as cancer cells. Report any side effects. Continue your course of treatment even though you feel ill unless your doctor tells you to stop. In some cases, you may be given additional medicines to help with side effects. Follow all directions for their use. Call your doctor or health care professional for advice if you get a fever, chills or sore throat, or other symptoms of a cold or flu. Do not treat yourself. This drug decreases your body's ability to fight infections. Try to avoid being around people who are sick. This medicine may increase your risk to bruise or bleed. Call your doctor or health care professional if you notice any unusual bleeding. Be careful brushing and flossing your teeth or using a toothpick because you may get an infection or bleed more easily. If you have any dental work done,  tell your dentist you are receiving this medicine. Avoid taking products that contain aspirin, acetaminophen, ibuprofen, naproxen, or ketoprofen unless instructed by your doctor. These medicines may hide a fever. Call your doctor or health care professional if you get diarrhea or mouth sores. Do not treat yourself. To protect your kidneys, drink water or other fluids as directed while you are taking this medicine. Do not become pregnant while taking this medicine or for 6 months after stopping it. Women should inform their doctor if they wish to become pregnant or think they might be pregnant. Men should not father a child while taking this medicine and for 3 months after stopping it. This may interfere with the ability to father a  child. You should talk to your doctor or health care professional if you are concerned about your fertility. There is a potential for serious side effects to an unborn child. Talk to your health care professional or pharmacist for more information. Do not breast-feed an infant while taking this medicine or for 1 week after stopping it. What side effects may I notice from receiving this medication? Side effects that you should report to your doctor or health care professional as soon as possible: allergic reactions like skin rash, itching or hives, swelling of the face, lips, or tongue breathing problems redness, blistering, peeling or loosening of the skin, including inside the mouth signs and symptoms of bleeding such as bloody or black, tarry stools; red or dark-brown urine; spitting up blood or brown material that looks like coffee grounds; red spots on the skin; unusual bruising or bleeding from the eye, gums, or nose signs and symptoms of infection like fever or chills; cough; sore throat; pain or trouble passing urine signs and symptoms of kidney injury like trouble passing urine or change in the amount of urine signs and symptoms of liver injury like dark yellow or brown urine; general ill feeling or flu-like symptoms; light-colored stools; loss of appetite; nausea; right upper belly pain; unusually weak or tired; yellowing of the eyes or skin Side effects that usually do not require medical attention (report to your doctor or health care professional if they continue or are bothersome): constipation mouth sores nausea, vomiting unusually weak or tired This list may not describe all possible side effects. Call your doctor for medical advice about side effects. You may report side effects to FDA at 1-800-FDA-1088. Where should I keep my medication? This drug is given in a hospital or clinic and will not be stored at home. NOTE: This sheet is a summary. It may not cover all possible  information. If you have questions about this medicine, talk to your doctor, pharmacist, or health care provider.  2022 Elsevier/Gold Standard (2017-06-05 00:00:00)  Carboplatin injection What is this medication? CARBOPLATIN (KAR boe pla tin) is a chemotherapy drug. It targets fast dividing cells, like cancer cells, and causes these cells to die. This medicine is used to treat ovarian cancer and many other cancers. This medicine may be used for other purposes; ask your health care provider or pharmacist if you have questions. COMMON BRAND NAME(S): Paraplatin What should I tell my care team before I take this medication? They need to know if you have any of these conditions: blood disorders hearing problems kidney disease recent or ongoing radiation therapy an unusual or allergic reaction to carboplatin, cisplatin, other chemotherapy, other medicines, foods, dyes, or preservatives pregnant or trying to get pregnant breast-feeding How should I use this medication? This drug is usually  given as an infusion into a vein. It is administered in a hospital or clinic by a specially trained health care professional. Talk to your pediatrician regarding the use of this medicine in children. Special care may be needed. Overdosage: If you think you have taken too much of this medicine contact a poison control center or emergency room at once. NOTE: This medicine is only for you. Do not share this medicine with others. What if I miss a dose? It is important not to miss a dose. Call your doctor or health care professional if you are unable to keep an appointment. What may interact with this medication? medicines for seizures medicines to increase blood counts like filgrastim, pegfilgrastim, sargramostim some antibiotics like amikacin, gentamicin, neomycin, streptomycin, tobramycin vaccines Talk to your doctor or health care professional before taking any of these  medicines: acetaminophen aspirin ibuprofen ketoprofen naproxen This list may not describe all possible interactions. Give your health care provider a list of all the medicines, herbs, non-prescription drugs, or dietary supplements you use. Also tell them if you smoke, drink alcohol, or use illegal drugs. Some items may interact with your medicine. What should I watch for while using this medication? Your condition will be monitored carefully while you are receiving this medicine. You will need important blood work done while you are taking this medicine. This drug may make you feel generally unwell. This is not uncommon, as chemotherapy can affect healthy cells as well as cancer cells. Report any side effects. Continue your course of treatment even though you feel ill unless your doctor tells you to stop. In some cases, you may be given additional medicines to help with side effects. Follow all directions for their use. Call your doctor or health care professional for advice if you get a fever, chills or sore throat, or other symptoms of a cold or flu. Do not treat yourself. This drug decreases your body's ability to fight infections. Try to avoid being around people who are sick. This medicine may increase your risk to bruise or bleed. Call your doctor or health care professional if you notice any unusual bleeding. Be careful brushing and flossing your teeth or using a toothpick because you may get an infection or bleed more easily. If you have any dental work done, tell your dentist you are receiving this medicine. Avoid taking products that contain aspirin, acetaminophen, ibuprofen, naproxen, or ketoprofen unless instructed by your doctor. These medicines may hide a fever. Do not become pregnant while taking this medicine. Women should inform their doctor if they wish to become pregnant or think they might be pregnant. There is a potential for serious side effects to an unborn child. Talk to your  health care professional or pharmacist for more information. Do not breast-feed an infant while taking this medicine. What side effects may I notice from receiving this medication? Side effects that you should report to your doctor or health care professional as soon as possible: allergic reactions like skin rash, itching or hives, swelling of the face, lips, or tongue signs of infection - fever or chills, cough, sore throat, pain or difficulty passing urine signs of decreased platelets or bleeding - bruising, pinpoint red spots on the skin, black, tarry stools, nosebleeds signs of decreased red blood cells - unusually weak or tired, fainting spells, lightheadedness breathing problems changes in hearing changes in vision chest pain high blood pressure low blood counts - This drug may decrease the number of white blood cells, red blood  cells and platelets. You may be at increased risk for infections and bleeding. nausea and vomiting pain, swelling, redness or irritation at the injection site pain, tingling, numbness in the hands or feet problems with balance, talking, walking trouble passing urine or change in the amount of urine Side effects that usually do not require medical attention (report to your doctor or health care professional if they continue or are bothersome): hair loss loss of appetite metallic taste in the mouth or changes in taste This list may not describe all possible side effects. Call your doctor for medical advice about side effects. You may report side effects to FDA at 1-800-FDA-1088. Where should I keep my medication? This drug is given in a hospital or clinic and will not be stored at home. NOTE: This sheet is a summary. It may not cover all possible information. If you have questions about this medicine, talk to your doctor, pharmacist, or health care provider.  2022 Elsevier/Gold Standard (2007-09-18 00:00:00)

## 2021-07-01 ENCOUNTER — Telehealth: Payer: Self-pay | Admitting: *Deleted

## 2021-07-01 ENCOUNTER — Telehealth: Payer: Self-pay

## 2021-07-01 NOTE — Telephone Encounter (Signed)
Attempted to call pt to see how he did after his recent treatment.  Reached vm that was't set up.  Called [name] & spoke with Suanne Marker who says he is doing ok & reminded to call us if he has any concerns.   ?

## 2021-07-01 NOTE — Telephone Encounter (Signed)
Contacted [name] regarding the patient's upcoming appointments. I spoke with Suanne Marker who will set up getting the patient transportation to his appointment. I informed her of the date and time the patient needs to be here, and asked her to let him know this information as well. She verbalized understanding.  ?

## 2021-07-01 NOTE — Telephone Encounter (Signed)
-----   Message from Willis Modena, RN sent at 06/30/2021 12:10 PM EST ----- ?Regarding: Dr. Julien Nordmann 1st time Keytruda/Alimta/Carbo f/u--call back 07/01/21 ?Pt tol 1st time trtmt well w/out incident ? ?

## 2021-07-04 NOTE — Progress Notes (Signed)
[facility] P[facility]IATIVE CARE SW NOTE ? ?PATIENT NAME: Christopher Dawson ?DOB: [birth year] ?MRN: [id number] ? ?PRIMARY CARE PROVIDER: [name], MD ? ?RESPONSIBLE PARTY:  ?Acct ID - Guarantor Home Phone Work Phone Relationship Acct Type  ?[id number] - Oesterling,JEFF* [phone]  Self P/F  ?   [address], [place], [place] [address]  ? ?SOCIAL WORK ENCOUNTER ? ?PC SW completed a visit with patient at [facility] to provide counseling and support as requesting by primary PC NP-K. Highfill. SW found patient in the bed, awake and alert. Patient denied pain, ut reported that he does have intermittent generalized pain. He reports weakness where he is not able to sit up in a wheelchair. He is hoping to start PT/OT to assist with this. Patient reported that he had therapy before, but was discharged. He also expressed that he will begin to get transfusions on Thursday. SW provided adjustment and supportive counseling with patient. He also clarified his goals which is to return home. SW processed with patient what "going home" would look like for him with his current status. Patient lived alone. He has siblings that live out of state. He will be dependent on friends. SW discussed the reality of his goals versus his safety and care needs.  ?SW consulted with the facility nurse-Rhonda to update her on visit and discuss patient status. ? ?Duration of visit and documentation: 60 minutes. ? ?Katheren Puller, LCSW ? ?

## 2021-07-07 ENCOUNTER — Other Ambulatory Visit: Payer: Medicare Other

## 2021-07-14 ENCOUNTER — Ambulatory Visit: Payer: Medicare Other

## 2021-07-14 ENCOUNTER — Ambulatory Visit: Payer: Medicare Other | Admitting: Internal Medicine

## 2021-07-14 ENCOUNTER — Other Ambulatory Visit: Payer: Medicare Other

## 2021-07-18 ENCOUNTER — Inpatient Hospital Stay: Payer: Medicare Other

## 2021-07-18 ENCOUNTER — Other Ambulatory Visit: Payer: Self-pay | Admitting: Medical Oncology

## 2021-07-18 ENCOUNTER — Other Ambulatory Visit: Payer: Self-pay | Admitting: Internal Medicine

## 2021-07-18 ENCOUNTER — Inpatient Hospital Stay (HOSPITAL_BASED_OUTPATIENT_CLINIC_OR_DEPARTMENT_OTHER): Payer: Medicare Other | Admitting: Internal Medicine

## 2021-07-18 ENCOUNTER — Ambulatory Visit: Payer: Medicare Other

## 2021-07-18 ENCOUNTER — Other Ambulatory Visit: Payer: Self-pay

## 2021-07-18 VITALS — BP 103/65 | HR 95 | Temp 98.4°F | Resp 17

## 2021-07-18 DIAGNOSIS — Z5111 Encounter for antineoplastic chemotherapy: Secondary | ICD-10-CM | POA: Diagnosis not present

## 2021-07-18 DIAGNOSIS — Z5112 Encounter for antineoplastic immunotherapy: Secondary | ICD-10-CM

## 2021-07-18 DIAGNOSIS — C3492 Malignant neoplasm of unspecified part of left bronchus or lung: Secondary | ICD-10-CM

## 2021-07-18 DIAGNOSIS — C7951 Secondary malignant neoplasm of bone: Secondary | ICD-10-CM

## 2021-07-18 DIAGNOSIS — D630 Anemia in neoplastic disease: Secondary | ICD-10-CM

## 2021-07-18 LAB — CBC WITH DIFFERENTIAL (CANCER CENTER ONLY)
Abs Immature Granulocytes: 0.04 10*3/uL (ref 0.00–0.07)
Basophils Absolute: 0 10*3/uL (ref 0.0–0.1)
Basophils Relative: 0 %
Eosinophils Absolute: 0.1 10*3/uL (ref 0.0–0.5)
Eosinophils Relative: 2 %
HCT: 25.6 % — ABNORMAL LOW (ref 39.0–52.0)
Hemoglobin: 7.9 g/dL — ABNORMAL LOW (ref 13.0–17.0)
Immature Granulocytes: 1 %
Lymphocytes Relative: 8 %
Lymphs Abs: 0.5 10*3/uL — ABNORMAL LOW (ref 0.7–4.0)
MCH: 27.1 pg (ref 26.0–34.0)
MCHC: 30.9 g/dL (ref 30.0–36.0)
MCV: 87.7 fL (ref 80.0–100.0)
Monocytes Absolute: 1.2 10*3/uL — ABNORMAL HIGH (ref 0.1–1.0)
Monocytes Relative: 21 %
Neutro Abs: 4 10*3/uL (ref 1.7–7.7)
Neutrophils Relative %: 68 %
Platelet Count: 301 10*3/uL (ref 150–400)
RBC: 2.92 MIL/uL — ABNORMAL LOW (ref 4.22–5.81)
RDW: 18.8 % — ABNORMAL HIGH (ref 11.5–15.5)
WBC Count: 5.9 10*3/uL (ref 4.0–10.5)
nRBC: 0 % (ref 0.0–0.2)

## 2021-07-18 LAB — CMP (CANCER CENTER ONLY)
ALT: 15 U/L (ref 0–44)
AST: 14 U/L — ABNORMAL LOW (ref 15–41)
Albumin: 3 g/dL — ABNORMAL LOW (ref 3.5–5.0)
Alkaline Phosphatase: 54 U/L (ref 38–126)
Anion gap: 6 (ref 5–15)
BUN: 15 mg/dL (ref 8–23)
CO2: 28 mmol/L (ref 22–32)
Calcium: 8.9 mg/dL (ref 8.9–10.3)
Chloride: 101 mmol/L (ref 98–111)
Creatinine: 0.49 mg/dL — ABNORMAL LOW (ref 0.61–1.24)
GFR, Estimated: >60 mL/min (ref >60)
Glucose, Bld: 109 mg/dL — ABNORMAL HIGH (ref 70–99)
Potassium: 4.5 mmol/L (ref 3.5–5.1)
Sodium: 135 mmol/L (ref 135–145)
Total Bilirubin: 0.4 mg/dL (ref 0.3–1.2)
Total Protein: 5.9 g/dL — ABNORMAL LOW (ref 6.5–8.1)

## 2021-07-18 LAB — ABO/RH: ABO/RH(D): AB POS

## 2021-07-18 LAB — PREPARE RBC (CROSSMATCH)

## 2021-07-18 MED ORDER — ACETAMINOPHEN 325 MG PO TABS
650.0000 mg | ORAL_TABLET | Freq: Once | ORAL | Status: AC
  Administered 2021-07-18: 650 mg via ORAL
  Filled 2021-07-18: qty 2

## 2021-07-18 MED ORDER — SODIUM CHLORIDE 0.9 % IV SOLN
200.0000 mg | Freq: Once | INTRAVENOUS | Status: AC
  Administered 2021-07-18: 200 mg via INTRAVENOUS
  Filled 2021-07-18: qty 200

## 2021-07-18 MED ORDER — SODIUM CHLORIDE 0.9 % IV SOLN
10.0000 mg | Freq: Once | INTRAVENOUS | Status: AC
  Administered 2021-07-18: 10 mg via INTRAVENOUS
  Filled 2021-07-18: qty 10

## 2021-07-18 MED ORDER — SODIUM CHLORIDE 0.9 % IV SOLN
509.5000 mg | Freq: Once | INTRAVENOUS | Status: AC
  Administered 2021-07-18: 509.5 mg via INTRAVENOUS
  Filled 2021-07-18: qty 50.95

## 2021-07-18 MED ORDER — SODIUM CHLORIDE 0.9 % IV SOLN
500.0000 mg/m2 | Freq: Once | INTRAVENOUS | Status: AC
  Administered 2021-07-18: 1000 mg via INTRAVENOUS
  Filled 2021-07-18: qty 40

## 2021-07-18 MED ORDER — SODIUM CHLORIDE 0.9 % IV SOLN
150.0000 mg | Freq: Once | INTRAVENOUS | Status: AC
  Administered 2021-07-18: 150 mg via INTRAVENOUS
  Filled 2021-07-18: qty 150

## 2021-07-18 MED ORDER — SODIUM CHLORIDE 0.9 % IV SOLN: Freq: Once | INTRAVENOUS | Status: AC

## 2021-07-18 MED ORDER — SODIUM CHLORIDE 0.9% IV SOLUTION
250.0000 mL | Freq: Once | INTRAVENOUS | Status: AC
  Administered 2021-07-18: 250 mL via INTRAVENOUS

## 2021-07-18 MED ORDER — PALONOSETRON HCL INJECTION 0.25 MG/5ML
0.2500 mg | Freq: Once | INTRAVENOUS | Status: AC
  Administered 2021-07-18: 0.25 mg via INTRAVENOUS
  Filled 2021-07-18: qty 5

## 2021-07-18 MED ORDER — DIPHENHYDRAMINE HCL 25 MG PO CAPS
25.0000 mg | ORAL_CAPSULE | Freq: Once | ORAL | Status: AC
  Administered 2021-07-18: 25 mg via ORAL
  Filled 2021-07-18: qty 1

## 2021-07-18 NOTE — Progress Notes (Signed)
?    [name] ?Telephone:([phone]) [phone]   [phone] ? ?OFFICE PROGRESS NOTE ? ?[name], MD ?[address] ?[place] [address] ? ?DIAGNOSIS: Stage IV (T1c, N1, M1 C) non-small cell lung cancer, adenosquamous carcinoma presented with left upper lobe lung nodule in addition to left hilar adenopathy and bone metastasis diagnosed January 2023. ? ?Molecular studies by foundation 1 showed positive KRAS G12C mutation ? ?PD-L1 expression 10% ? ?PRIOR THERAPY: Palliative radiotherapy to the T12 destructive pathologic fracture under the care of Dr. Tammi Klippel completed on May 10, 2021. ? ?CURRENT THERAPY: Systemic chemotherapy with carboplatin for AUC of 5, Alimta 500 Mg/M2 and Keytruda 200 Mg IV every 3 weeks.  First dose June 30, 2021.  Status post 1 cycle ? ?INTERVAL HISTORY: ?Christopher Dawson 66 y.o. male who was seen at the chemotherapy infusion room today before starting the second cycle of his treatment.  The patient tolerated the first cycle of his treatment well except for mild fatigue.  He denied having any current chest pain, shortness of breath, cough or hemoptysis.  He has no nausea, vomiting, diarrhea or constipation.  He has no headache or visual changes.  He denied having any significant weight loss or night sweats.  He continues to have the significant weakness in the lower extremities and he still a resident of skilled nursing facility because he cannot go home with no family members around.  He is here today for evaluation before starting cycle #2 of his treatment. ? ?MEDICAL HISTORY: ?Past Medical History:  ?Diagnosis Date  ? Anxiety   ? Depression   ? Lumbar radiculopathy, chronic 2008  ? Marfan syndrome   ? Neurosis, depressive   ? Shortening, leg, congenital   ? ? ?ALLERGIES:  is allergic to codeine. ? ?MEDICATIONS:  ?Current Outpatient Medications  ?Medication Sig Dispense Refill  ? acetaminophen (TYLENOL) 325 MG tablet Take 2 tablets (650 mg total)  by mouth every 6 (six) hours as needed for mild pain (or Fever >/= 101). 30 tablet 0  ? bisacodyl (DULCOLAX) 5 MG EC tablet Take 1 tablet (5 mg total) by mouth daily. 30 tablet 0  ? clomiPRAMINE (ANAFRANIL) 25 MG capsule Take 4 capsules (100 mg total) by mouth at bedtime. As needed. 30 capsule 2  ? feeding supplement (ENSURE ENLIVE / ENSURE PLUS) LIQD Take 237 mLs by mouth 3 (three) times daily between meals. 237 mL 12  ? fentaNYL (DURAGESIC) 25 MCG/HR Place 1 patch onto the skin every 3 (three) days.    ? folic acid (FOLVITE) 1 MG tablet Take 1 tablet (1 mg total) by mouth daily. 30 tablet 4  ? gabapentin (NEURONTIN) 100 MG capsule Take 1 capsule (100 mg total) by mouth 3 (three) times daily. 90 capsule 0  ? hydrOXYzine (ATARAX) 25 MG tablet Take 1 tablet (25 mg total) by mouth 3 (three) times daily as needed for anxiety. 30 tablet 0  ? lactulose (CHRONULAC) 10 GM/15ML solution Take 45 mLs (30 g total) by mouth 2 (two) times daily. 236 mL 0  ? Multiple Vitamin (MULTIVITAMIN WITH MINERALS) TABS tablet Take 1 tablet by mouth daily. 30 tablet 0  ? Multiple Vitamins-Minerals (DECUBI-VITE) CAPS Take by mouth.    ? nicotine (NICODERM CQ - DOSED IN MG/24 HOURS) 14 mg/24hr patch Place 1 patch (14 mg total) onto the skin daily. 28 patch 0  ? ondansetron (ZOFRAN) 4 MG tablet Take 1 tablet (4 mg total) by mouth every 6 (six) hours as  needed for nausea. 20 tablet 0  ? oxyCODONE (OXY IR/ROXICODONE) 5 MG immediate release tablet Take 1-2 tablets (5-10 mg total) by mouth every 4 (four) hours as needed for moderate pain. 10 tablet 0  ? pantoprazole (PROTONIX) 40 MG tablet Take 1 tablet (40 mg total) by mouth daily. 30 tablet 0  ? prochlorperazine (COMPAZINE) 10 MG tablet Take 1 tablet (10 mg total) by mouth every 6 (six) hours as needed for nausea or vomiting. 30 tablet 0  ? senna-docusate (SENOKOT-S) 8.6-50 MG tablet Take 1 tablet by mouth 2 (two) times daily. 30 tablet 0  ? tamsulosin (FLOMAX) 0.4 MG CAPS capsule Take 1 capsule  (0.4 mg total) by mouth daily after supper. 30 capsule 0  ? thiamine 100 MG tablet Take 1 tablet (100 mg total) by mouth daily. 30 tablet 0  ? ?No current facility-administered medications for this visit.  ? ? ?SURGICAL HISTORY:  ?Past Surgical History:  ?Procedure Laterality Date  ? LEG SURGERY    ? ? ?REVIEW OF SYSTEMS:  Constitutional: positive for fatigue ?Eyes: negative ?Ears, nose, mouth, throat, and face: negative ?Respiratory: negative ?Cardiovascular: negative ?Gastrointestinal: negative ?Genitourinary:negative ?Integument/breast: negative ?Hematologic/lymphatic: negative ?Musculoskeletal:positive for muscle weakness ?Neurological: positive for weakness ?Behavioral/Psych: negative ?Endocrine: negative ?Allergic/Immunologic: negative  ? ?PHYSICAL EXAMINATION: General appearance: alert, cooperative, fatigued, and no distress ?Head: Normocephalic, without obvious abnormality, atraumatic ?Neck: no adenopathy, no JVD, supple, symmetrical, trachea midline, and thyroid not enlarged, symmetric, no tenderness/mass/nodules ?Lymph nodes: Cervical, supraclavicular, and axillary nodes normal. ?Resp: clear to auscultation bilaterally ?Back: symmetric, no curvature. ROM normal. No CVA tenderness. ?Cardio: regular rate and rhythm, S1, S2 normal, no murmur, click, rub or gallop ?GI: soft, non-tender; bowel sounds normal; no masses,  no organomegaly ?Extremities: extremities normal, atraumatic, no cyanosis or edema ?Neurologic: Motor: Significant weakness in the lower extremities bilaterally. ? ?ECOG PERFORMANCE STATUS: 1 - Symptomatic but completely ambulatory ? ?There were no vitals taken for this visit. ? ?LABORATORY DATA: ?Lab Results  ?Component Value Date  ? WBC 8.0 06/02/2021  ? HGB 10.2 (L) 06/02/2021  ? HCT 30.5 (L) 06/02/2021  ? MCV 87.9 06/02/2021  ? PLT 526 (H) 06/02/2021  ? ? ?  Chemistry   ?   ?Component Value Date/Time  ? NA 138 06/02/2021 1231  ? NA 146 (H) 07/04/2018 1642  ? K 3.5 06/02/2021 1231  ? CL 101  06/02/2021 1231  ? CO2 31 06/02/2021 1231  ? BUN 17 06/02/2021 1231  ? BUN 13 07/04/2018 1642  ? CREATININE 0.61 06/02/2021 1231  ? CREATININE 0.95 07/04/2012 1534  ?    ?Component Value Date/Time  ? CALCIUM 8.5 (L) 06/02/2021 1231  ? ALKPHOS 105 06/02/2021 1231  ? AST 53 (H) 06/02/2021 1231  ? ALT 53 (H) 06/02/2021 1231  ? BILITOT 0.4 06/02/2021 1231  ?  ? ? ? ?RADIOGRAPHIC STUDIES: ?No results found. ? ?ASSESSMENT AND PLAN: This is a very pleasant 66 years old white male with a stage IV (T1c, M1, M1 C) non-small cell lung cancer, adenosquamous carcinoma presented with left upper lobe lung nodule in addition to left hilar adenopathy and bone metastasis diagnosed in January 2023 with positive KRAS G12C mutation and PD-L1 expression of 10%. ?He is currently on systemic chemotherapy with carboplatin for AUC of 5, Alimta 500 Mg/M2 and Keytruda 200 Mg IV every 3 weeks.  First cycle June 30, 2021.  Status post 1 cycle.  The patient tolerated the first cycle of his treatment fairly well with no concerning adverse  effect except for fatigue. ?I recommended for him to proceed with cycle #2 today as planned. ?I will see him back for follow-up visit in 3 weeks for evaluation before starting cycle #3. ?For the chemotherapy-induced anemia, I will arrange for the patient to receive at least 1 unit of PRBCs transfusion today. ?He will continue to have his blood work performed at the rehab facility to avoid the EMS transportation just for blood work. ?The patient was advised to call immediately if he has any other concerning symptoms in the interval. ?The patient voices understanding of current disease status and treatment options and is in agreement with the current care plan. ? ?All questions were answered. The patient knows to call the clinic with any problems, questions or concerns. We can certainly see the patient much sooner if necessary. ? ?The total time spent in the appointment was 33 minutes. ? ?Disclaimer: This note was  dictated with voice recognition software. Similar sounding words can inadvertently be transcribed and may not be corrected upon review. ? ? ?  ?   ?

## 2021-07-18 NOTE — Progress Notes (Signed)
Okay to treat with pulse 111 and Hgb 7.9, patient will receive 1 unit PRBC today as well. ?

## 2021-07-18 NOTE — Progress Notes (Signed)
Blood orders entered and released. ?

## 2021-07-18 NOTE — Patient Instructions (Signed)
Blood Transfusion, Adult, Care After This sheet gives you information about how to care for yourself after your procedure. Your doctor may also give you more specific instructions. If you have problems or questions, contact your doctor. What can I expect after the procedure? After the procedure, it is common to have: Bruising and soreness at the IV site. A headache. Follow these instructions at home: Insertion site care   Follow instructions from your doctor about how to take care of your insertion site. This is where an IV tube was put into your vein. Make sure you: Wash your hands with soap and water before and after you change your bandage (dressing). If you cannot use soap and water, use hand sanitizer. Change your bandage as told by your doctor. Check your insertion site every day for signs of infection. Check for: Redness, swelling, or pain. Bleeding from the site. Warmth. Pus or a bad smell. General instructions Take over-the-counter and prescription medicines only as told by your doctor. Rest as told by your doctor. Go back to your normal activities as told by your doctor. Keep all follow-up visits as told by your doctor. This is important. Contact a doctor if: You have itching or red, swollen areas of skin (hives). You feel worried or nervous (anxious). You feel weak after doing your normal activities. You have redness, swelling, warmth, or pain around the insertion site. You have blood coming from the insertion site, and the blood does not stop with pressure. You have pus or a bad smell coming from the insertion site. Get help right away if: You have signs of a serious reaction. This may be coming from an allergy or the body's defense system (immune system). Signs include: Trouble breathing or shortness of breath. Swelling of the face or feeling warm (flushed). Fever or chills. Head, chest, or back pain. Dark pee (urine) or blood in the pee. Widespread rash. Fast  heartbeat. Feeling dizzy or light-headed. You may receive your blood transfusion in an outpatient setting. If so, you will be told whom to contact to report any reactions. These symptoms may be an emergency. Do not wait to see if the symptoms will go away. Get medical help right away. Call your local emergency services (911 in the [place] Do not drive yourself to the hospital. Summary Bruising and soreness at the IV site are common. Check your insertion site every day for signs of infection. Rest as told by your doctor. Go back to your normal activities as told by your doctor. Get help right away if you have signs of a serious reaction. This information is not intended to replace advice given to you by your health care provider. Make sure you discuss any questions you have with your health care provider. Document Revised: 08/05/2020 Document Reviewed: 10/03/2018 Elsevier Patient Education  2022 Else[employer] Inc.  

## 2021-07-18 NOTE — Progress Notes (Signed)
This encounter was created in error - please disregard.

## 2021-07-19 LAB — TYPE AND SCREEN
ABO/RH(D): AB POS
Antibody Screen: NEGATIVE
Unit division: 0

## 2021-07-19 LAB — BPAM RBC
Blood Product Expiration Date: 202304172359
ISSUE DATE / TIME: 202303271330
Unit Type and Rh: 6200

## 2021-07-21 ENCOUNTER — Other Ambulatory Visit: Payer: Medicare Other

## 2021-07-25 ENCOUNTER — Non-Acute Institutional Stay: Payer: Medicare Other | Admitting: Family Medicine

## 2021-07-25 ENCOUNTER — Encounter: Payer: Self-pay | Admitting: Family Medicine

## 2021-07-25 ENCOUNTER — Encounter: Payer: Self-pay | Admitting: Internal Medicine

## 2021-07-25 VITALS — BP 112/68 | HR 111 | Temp 97.5°F | Resp 18

## 2021-07-25 DIAGNOSIS — G893 Neoplasm related pain (acute) (chronic): Secondary | ICD-10-CM

## 2021-07-25 DIAGNOSIS — C3492 Malignant neoplasm of unspecified part of left bronchus or lung: Secondary | ICD-10-CM

## 2021-07-25 DIAGNOSIS — Z515 Encounter for palliative care: Secondary | ICD-10-CM

## 2021-07-25 DIAGNOSIS — K5900 Constipation, unspecified: Secondary | ICD-10-CM

## 2021-07-25 NOTE — Progress Notes (Signed)
? ? ?[facility] ?[facility] Consult Note ?Telephone: [phone]  ?Fax: [phone]  ? ? ?Date of encounter: 07/25/21 ?12:35 PM ?PATIENT NAME: Christopher Dawson ?[place] [address]   ?[phone] (home)  ?DOB: [birth year] ?MRN: [id number] ?PRIMARY CARE PROVIDER:    ?[name], MD,  ?[address] ?[place] [address] ?[phone] ? ?REFERRING PROVIDER:   ?[name], MD ?[address] ?[place],  [place] [address] ?[phone] ? ?RESPONSIBLE PARTY:    ?Contact Information   ? ? Name Relation Home Work Mobile  ? Oglesby,Lou Mother [phone]    ? ?  ? ? ? ?I met face to face with patient in [name] facility. Palliative Care was asked to follow this patient by consultation request of  [name],* to address advance care planning and complex medical decision making. This is a follow up visit. ? ?                                 ASSESSMENT, SYMPTOM MANAGEMENT AND PLAN / RECOMMENDATIONS:  ? ? Cancer related pain ?Continue Fentanyl 25 mcg patch Q 3 days. ?Continue Oxy IR 5-10 mg (had orders for 5/325 mg) Q 4 hrs prn for breakthrough pain. ?Increase Gabapentin to 100 mg TID to help with neuropathic component of pain. ? ?2.   Non Small Cell Lung Cancer of left lung stage 4 ?Started chemo 06/30/21, Q 3 weeks with labs done prior to infusion at facility. ?Stat portable CXR and have results called to Palliative provider, left number for call back. ?CBC HGB 7.9 (decreased from 10.2, 14.1). ?Received transfusion at cancer center of 1 unit PRBC on 07/18/21 ?  ?3.    Constipation ?Secondary to poor mobility/intake and opiods. ?Continue lactulose (increased by oncology to 30 gm BID), Senna 1 po BID ?Dulcolax 5 mg daily (ordered by Oncology) ?  ?4.  Palliative Care Encounter ?Continue to follow as care evolves for advance directive, code status and goals of care with education regarding options ? ?Advance Care Planning/Goals of  Care: Goals include to maximize quality of life and symptom management.  ?CODE STATUS: ?Limited full code without intubation per Oncology NP ? ? ? ?Follow up Palliative Care Visit: Palliative care will continue to follow for complex medical decision making, advance care planning, and clarification of goals. Return 4 weeks or prn. ? ? ? ?This visit was coded based on medical decision making (MDM). ? ?PPS: 40% ? ?HOSPICE ELIGIBILITY/DIAGNOSIS: TBD ? ?Chief Complaint:  ? Palliative care is following for chronic management of lung cancer and pain management  ? ?HISTORY OF PRESENT ILLNESS:  Christopher Dawson is a 65 y.o. year old male  with stage IV non-small cell lung cancer with metastasis to bone and hilar adenopathy s/p Palliative Radiation and current palliative chemotherapy Q 3 weeks under orders from Dr Earlie Server.  He also has a hx of Marfan's syndrome,  COPD, chronic lumbar radiculopathy, thrombocytopenia, depression, hx of ETOH abuse, hypocalcemia, HLD, hyponatremia and constipation.  Pt rates his pain a 6-7 on a 10 scale and asked why he was reduced on his Oxycodone from 20 mg to 5/325 mg Q 4 hours.  Advised pt that when Fentanyl was added for continuous pain medication that his Oxycodone dose had to be reduced but frequency overall was increased.  Advised pt that if he was experiencing a lot of breakthrough pain that we could increase his fentanyl  and maintain his OxyCodone for breakthrough.  He does not want to increase his fentanyl but is asking for extra OxyCodone tab prn to make 10 mg to help with pain.  He states his pain goal is a 5 on 10 scale but he also wants to increase Gabapentin dose from 100 mg QHS to 100 mg TID (this was already in place per Dr Lew Dawes note from last visit on 07/18/21).  Nursing indicates that pt is "not getting any relief with Fentanyl patch" and is not asking for breakthrough pain meds.  Given the disparities between the two it is difficult to know how pt is really managing with  his pain and whether or not there should be an additional scheduled dose or change in Fentanyl dose. Denies falls.  Has some intermittent, brief SOB at times.  States his appetite is fair due to "getting tired of the same foods".  He is taking a Ensure supplement TID and most recent Albumin was 3.0 on 07/18/21. States he has tolerated both rounds of chemotherapy well and did not have any diaphoresis until after returning to facility the last time.  He states he was told that Gabapentin 100 mg was low dose and wants to increase to see if this will help with sciatica and allow him to sit up more in the wheelchair.  He also states he was told that 3 meds for constipation was "too much" so he stopped taking lactulose.  He reports more frequent stools prior but states they were still "hard".  Denies blood in stool.  He recently required disimpaction and states stool was very hard and painful. ? ?History obtained from review of EMR, discussion with Facility staff and/or Christopher Dawson.  ?I reviewed available labs, medications, imaging, studies and related documents from the EMR.  Records reviewed and summarized above.  ? ?ROS ?General: NAD ?EYES: denies vision changes ?ENMT: denies dysphagia ?Cardiovascular: denies chest pain, denies DOE ?Pulmonary: denies cough, denies increased SOB ?Abdomen: endorses Fair appetite and severe constipation, endorses continence of bowel ?GU: denies dysuria, has foley catheter ?MSK:  endorses generalized weakness,  no falls reported ?Skin: denies rashes or wounds ?Neurological: Reports difficulty sleeping with noise at facility ?Psych: endorses no anxiety currently ?Heme/lymph/immuno: denies bruises, abnormal bleeding ? ?Physical Exam: ?Current and past weights: last weight 165 lbs 06/16/21 ?Constitutional: NAD ?General: frail, cachectic male ?EYES: anicteric sclera, lids intact, no discharge  ?ENMT: intact hearing, oral mucous membranes moist, dentition intact ?CV: S1S2, tachycardic regular  rhythm, no LE edema ?Pulmonary: CTAB, no increased work of breathing, no cough, room air ?Abdomen: normo-active BS + 4 quadrants, soft and non tender, no ascites ?GU: deferred ?MSK: noted sarcopenia of all 4 limbs, moves all extremities, noted muscular atrophy of BLE ?Skin: warm and dry, no rashes or wounds on visible skin ?Neuro:  noted generalized weakness,  no cognitive impairment ?Psych: blunted affect, A and O x 3 ?Hem/lymph/immuno: no widespread bruising ? ? ?Thank you for the opportunity to participate in the care of Christopher Dawson.  The palliative care team will continue to follow. Please call our office at [phone] if we can be of additional assistance.  ? ?Marijo Conception, FNP -C ? ?COVID-19 PATIENT SCREENING TOOL ?Asked and negative response unless otherwise noted:  ? ?Have you had symptoms of covid, tested positive or been in contact with someone with symptoms/positive test in the past 5-10 days?  Unknown ?

## 2021-07-28 ENCOUNTER — Other Ambulatory Visit: Payer: Medicare Other

## 2021-08-01 ENCOUNTER — Encounter: Payer: Self-pay | Admitting: Internal Medicine

## 2021-08-03 ENCOUNTER — Ambulatory Visit: Payer: Medicare Other

## 2021-08-03 ENCOUNTER — Other Ambulatory Visit: Payer: Medicare Other

## 2021-08-03 ENCOUNTER — Ambulatory Visit: Payer: Medicare Other | Admitting: Physician Assistant

## 2021-08-05 ENCOUNTER — Other Ambulatory Visit: Payer: Self-pay

## 2021-08-05 DIAGNOSIS — C3492 Malignant neoplasm of unspecified part of left bronchus or lung: Secondary | ICD-10-CM

## 2021-08-08 ENCOUNTER — Inpatient Hospital Stay: Payer: Medicare Other | Admitting: Internal Medicine

## 2021-08-08 ENCOUNTER — Inpatient Hospital Stay: Payer: Medicare Other

## 2021-08-08 ENCOUNTER — Ambulatory Visit: Payer: Medicare Other

## 2021-08-08 ENCOUNTER — Other Ambulatory Visit: Payer: Self-pay

## 2021-08-08 ENCOUNTER — Inpatient Hospital Stay (HOSPITAL_BASED_OUTPATIENT_CLINIC_OR_DEPARTMENT_OTHER): Payer: Medicare Other | Admitting: Internal Medicine

## 2021-08-08 ENCOUNTER — Inpatient Hospital Stay: Payer: Medicare Other | Attending: Physician Assistant

## 2021-08-08 ENCOUNTER — Encounter: Payer: Self-pay | Admitting: Internal Medicine

## 2021-08-08 VITALS — BP 105/73 | HR 110 | Temp 98.3°F | Resp 18

## 2021-08-08 DIAGNOSIS — Z5111 Encounter for antineoplastic chemotherapy: Secondary | ICD-10-CM | POA: Insufficient documentation

## 2021-08-08 DIAGNOSIS — Z5112 Encounter for antineoplastic immunotherapy: Secondary | ICD-10-CM | POA: Insufficient documentation

## 2021-08-08 DIAGNOSIS — C7951 Secondary malignant neoplasm of bone: Secondary | ICD-10-CM | POA: Diagnosis not present

## 2021-08-08 DIAGNOSIS — Z923 Personal history of irradiation: Secondary | ICD-10-CM | POA: Diagnosis not present

## 2021-08-08 DIAGNOSIS — D6481 Anemia due to antineoplastic chemotherapy: Secondary | ICD-10-CM | POA: Insufficient documentation

## 2021-08-08 DIAGNOSIS — C3492 Malignant neoplasm of unspecified part of left bronchus or lung: Secondary | ICD-10-CM

## 2021-08-08 DIAGNOSIS — R5383 Other fatigue: Secondary | ICD-10-CM | POA: Diagnosis not present

## 2021-08-08 DIAGNOSIS — E538 Deficiency of other specified B group vitamins: Secondary | ICD-10-CM | POA: Diagnosis not present

## 2021-08-08 DIAGNOSIS — C3490 Malignant neoplasm of unspecified part of unspecified bronchus or lung: Secondary | ICD-10-CM

## 2021-08-08 DIAGNOSIS — C3412 Malignant neoplasm of upper lobe, left bronchus or lung: Secondary | ICD-10-CM

## 2021-08-08 LAB — CMP (CANCER CENTER ONLY)
ALT: 16 U/L (ref 0–44)
AST: 16 U/L (ref 15–41)
Albumin: 3.2 g/dL — ABNORMAL LOW (ref 3.5–5.0)
Alkaline Phosphatase: 63 U/L (ref 38–126)
Anion gap: 5 (ref 5–15)
BUN: 15 mg/dL (ref 8–23)
CO2: 30 mmol/L (ref 22–32)
Calcium: 9 mg/dL (ref 8.9–10.3)
Chloride: 101 mmol/L (ref 98–111)
Creatinine: 0.57 mg/dL — ABNORMAL LOW (ref 0.61–1.24)
GFR, Estimated: >60 mL/min (ref >60)
Glucose, Bld: 135 mg/dL — ABNORMAL HIGH (ref 70–99)
Potassium: 3.9 mmol/L (ref 3.5–5.1)
Sodium: 136 mmol/L (ref 135–145)
Total Bilirubin: 0.2 mg/dL — ABNORMAL LOW (ref 0.3–1.2)
Total Protein: 6.3 g/dL — ABNORMAL LOW (ref 6.5–8.1)

## 2021-08-08 LAB — CBC WITH DIFFERENTIAL (CANCER CENTER ONLY)
Abs Immature Granulocytes: 0.05 10*3/uL (ref 0.00–0.07)
Basophils Absolute: 0 10*3/uL (ref 0.0–0.1)
Basophils Relative: 0 %
Eosinophils Absolute: 0.3 10*3/uL (ref 0.0–0.5)
Eosinophils Relative: 5 %
HCT: 29.6 % — ABNORMAL LOW (ref 39.0–52.0)
Hemoglobin: 8.9 g/dL — ABNORMAL LOW (ref 13.0–17.0)
Immature Granulocytes: 1 %
Lymphocytes Relative: 6 %
Lymphs Abs: 0.4 10*3/uL — ABNORMAL LOW (ref 0.7–4.0)
MCH: 26.7 pg (ref 26.0–34.0)
MCHC: 30.1 g/dL (ref 30.0–36.0)
MCV: 88.9 fL (ref 80.0–100.0)
Monocytes Absolute: 0.7 10*3/uL (ref 0.1–1.0)
Monocytes Relative: 11 %
Neutro Abs: 4.6 10*3/uL (ref 1.7–7.7)
Neutrophils Relative %: 77 %
Platelet Count: 374 10*3/uL (ref 150–400)
RBC: 3.33 MIL/uL — ABNORMAL LOW (ref 4.22–5.81)
RDW: 20.5 % — ABNORMAL HIGH (ref 11.5–15.5)
WBC Count: 6 10*3/uL (ref 4.0–10.5)
nRBC: 0 % (ref 0.0–0.2)

## 2021-08-08 MED ORDER — CYANOCOBALAMIN 1000 MCG/ML IJ SOLN
1000.0000 ug | Freq: Once | INTRAMUSCULAR | Status: AC
  Administered 2021-08-08: 1000 ug via INTRAMUSCULAR
  Filled 2021-08-08: qty 1

## 2021-08-08 MED ORDER — SODIUM CHLORIDE 0.9 % IV SOLN
500.0000 mg/m2 | Freq: Once | INTRAVENOUS | Status: AC
  Administered 2021-08-08: 1000 mg via INTRAVENOUS
  Filled 2021-08-08: qty 40

## 2021-08-08 MED ORDER — OXYCODONE HCL 5 MG PO TABS
5.0000 mg | ORAL_TABLET | Freq: Once | ORAL | Status: AC
  Administered 2021-08-08: 5 mg via ORAL
  Filled 2021-08-08: qty 1

## 2021-08-08 MED ORDER — SODIUM CHLORIDE 0.9 % IV SOLN
150.0000 mg | Freq: Once | INTRAVENOUS | Status: AC
  Administered 2021-08-08: 150 mg via INTRAVENOUS
  Filled 2021-08-08: qty 150

## 2021-08-08 MED ORDER — SODIUM CHLORIDE 0.9 % IV SOLN
200.0000 mg | Freq: Once | INTRAVENOUS | Status: AC
  Administered 2021-08-08: 200 mg via INTRAVENOUS
  Filled 2021-08-08: qty 200

## 2021-08-08 MED ORDER — SODIUM CHLORIDE 0.9 % IV SOLN: Freq: Once | INTRAVENOUS | Status: AC

## 2021-08-08 MED ORDER — PALONOSETRON HCL INJECTION 0.25 MG/5ML
0.2500 mg | Freq: Once | INTRAVENOUS | Status: AC
  Administered 2021-08-08: 0.25 mg via INTRAVENOUS
  Filled 2021-08-08: qty 5

## 2021-08-08 MED ORDER — SODIUM CHLORIDE 0.9 % IV SOLN
509.5000 mg | Freq: Once | INTRAVENOUS | Status: AC
  Administered 2021-08-08: 509.5 mg via INTRAVENOUS
  Filled 2021-08-08: qty 50.95

## 2021-08-08 MED ORDER — SODIUM CHLORIDE 0.9 % IV SOLN
10.0000 mg | Freq: Once | INTRAVENOUS | Status: AC
  Administered 2021-08-08: 10 mg via INTRAVENOUS
  Filled 2021-08-08: qty 10

## 2021-08-08 NOTE — Progress Notes (Signed)
1337 - [facility] called, informed patient will be ready to be picked up at 1415.  They are sending a truck. ?

## 2021-08-08 NOTE — Progress Notes (Signed)
Per Dr. Julien Nordmann, ok to treat with tachycardia today. ?

## 2021-08-08 NOTE — Progress Notes (Signed)
?    [facility] ?[phone]) [phone]   [phone] ? ?OFFICE PROGRESS NOTE ? ?[name], MD ?[address] ?[place] [address] ? ?DIAGNOSIS: Stage IV (T1c, N1, M1 C) non-small cell lung cancer, adenosquamous carcinoma presented with left upper lobe lung nodule in addition to left hilar adenopathy and bone metastasis diagnosed January 2023. ? ?Molecular studies by foundation 1 showed positive KRAS G12C mutation ? ?PD-L1 expression 10% ? ?PRIOR THERAPY: Palliative radiotherapy to the T12 destructive pathologic fracture under the care of Dr. Tammi Klippel completed on May 10, 2021. ? ?CURRENT THERAPY: Systemic chemotherapy with carboplatin for AUC of 5, Alimta 500 Mg/M2 and Keytruda 200 Mg IV every 3 weeks.  First dose June 30, 2021.  Status post 2 cycles ? ?INTERVAL HISTORY: ?Christopher Dawson 66 y.o. male was seen at the chemotherapy treatment room today for evaluation before starting cycle #3.  The patient is feeling fine with no concerning complaints except for the persistent weakness in the lower extremities and he is unable to ambulate for now.  He comes with EMS for his treatment.  The patient tolerated the second cycle of his treatment well except for mild fatigue.  He denied having any nausea, vomiting, diarrhea or constipation.  He denied having any headache or visual changes.  He has no chest pain, shortness of breath, cough or hemoptysis. ? ?MEDICAL HISTORY: ?Past Medical History:  ?Diagnosis Date  ? Anxiety   ? Depression   ? Lumbar radiculopathy, chronic 2008  ? Marfan syndrome   ? Neurosis, depressive   ? Shortening, leg, congenital   ? ? ?ALLERGIES:  is allergic to codeine. ? ?MEDICATIONS:  ?Current Outpatient Medications  ?Medication Sig Dispense Refill  ? acetaminophen (TYLENOL) 325 MG tablet Take 2 tablets (650 mg total) by mouth every 6 (six) hours as needed for mild pain (or Fever >/= 101). 30 tablet 0  ? bisacodyl (DULCOLAX) 5 MG EC tablet Take 1  tablet (5 mg total) by mouth daily. 30 tablet 0  ? clomiPRAMINE (ANAFRANIL) 25 MG capsule Take 4 capsules (100 mg total) by mouth at bedtime. As needed. 30 capsule 2  ? feeding supplement (ENSURE ENLIVE / ENSURE PLUS) LIQD Take 237 mLs by mouth 3 (three) times daily between meals. 237 mL 12  ? fentaNYL (DURAGESIC) 25 MCG/HR Place 1 patch onto the skin every 3 (three) days.    ? folic acid (FOLVITE) 1 MG tablet Take 1 tablet (1 mg total) by mouth daily. 30 tablet 4  ? gabapentin (NEURONTIN) 100 MG capsule Take 1 capsule (100 mg total) by mouth 3 (three) times daily. 90 capsule 0  ? hydrOXYzine (ATARAX) 25 MG tablet Take 1 tablet (25 mg total) by mouth 3 (three) times daily as needed for anxiety. 30 tablet 0  ? lactulose (CHRONULAC) 10 GM/15ML solution Take 45 mLs (30 g total) by mouth 2 (two) times daily. 236 mL 0  ? Multiple Vitamin (MULTIVITAMIN WITH MINERALS) TABS tablet Take 1 tablet by mouth daily. 30 tablet 0  ? Multiple Vitamins-Minerals (DECUBI-VITE) CAPS Take by mouth.    ? nicotine (NICODERM CQ - DOSED IN MG/24 HOURS) 14 mg/24hr patch Place 1 patch (14 mg total) onto the skin daily. 28 patch 0  ? ondansetron (ZOFRAN) 4 MG tablet Take 1 tablet (4 mg total) by mouth every 6 (six) hours as needed for nausea. 20 tablet 0  ? oxyCODONE (OXY IR/ROXICODONE) 5 MG immediate release tablet Take 1-2 tablets (5-10 mg total) by mouth  every 4 (four) hours as needed for moderate pain. 10 tablet 0  ? pantoprazole (PROTONIX) 40 MG tablet Take 1 tablet (40 mg total) by mouth daily. 30 tablet 0  ? prochlorperazine (COMPAZINE) 10 MG tablet Take 1 tablet (10 mg total) by mouth every 6 (six) hours as needed for nausea or vomiting. 30 tablet 0  ? senna-docusate (SENOKOT-S) 8.6-50 MG tablet Take 1 tablet by mouth 2 (two) times daily. 30 tablet 0  ? tamsulosin (FLOMAX) 0.4 MG CAPS capsule Take 1 capsule (0.4 mg total) by mouth daily after supper. 30 capsule 0  ? thiamine 100 MG tablet Take 1 tablet (100 mg total) by mouth daily. 30  tablet 0  ? ?No current facility-administered medications for this visit.  ? ? ?SURGICAL HISTORY:  ?Past Surgical History:  ?Procedure Laterality Date  ? LEG SURGERY    ? ? ?REVIEW OF SYSTEMS:  A comprehensive review of systems was negative except for: Constitutional: positive for fatigue ?Musculoskeletal: positive for back pain and muscle weakness ?Neurological: positive for weakness  ? ?PHYSICAL EXAMINATION: General appearance: alert, cooperative, fatigued, and no distress ?Head: Normocephalic, without obvious abnormality, atraumatic ?Neck: no adenopathy, no JVD, supple, symmetrical, trachea midline, and thyroid not enlarged, symmetric, no tenderness/mass/nodules ?Lymph nodes: Cervical, supraclavicular, and axillary nodes normal. ?Resp: clear to auscultation bilaterally ?Back: symmetric, no curvature. ROM normal. No CVA tenderness. ?Cardio: regular rate and rhythm, S1, S2 normal, no murmur, click, rub or gallop ?GI: soft, non-tender; bowel sounds normal; no masses,  no organomegaly ?Extremities: extremities normal, atraumatic, no cyanosis or edema ? ?ECOG PERFORMANCE STATUS: 1 - Symptomatic but completely ambulatory ? ?There were no vitals taken for this visit. ? ?LABORATORY DATA: ?Lab Results  ?Component Value Date  ? WBC 5.9 07/18/2021  ? HGB 7.9 (L) 07/18/2021  ? HCT 25.6 (L) 07/18/2021  ? MCV 87.7 07/18/2021  ? PLT 301 07/18/2021  ? ? ?  Chemistry   ?   ?Component Value Date/Time  ? NA 135 07/18/2021 0924  ? NA 146 (H) 07/04/2018 1642  ? K 4.5 07/18/2021 0924  ? CL 101 07/18/2021 0924  ? CO2 28 07/18/2021 0924  ? BUN 15 07/18/2021 0924  ? BUN 13 07/04/2018 1642  ? CREATININE 0.49 (L) 07/18/2021 0924  ? CREATININE 0.95 07/04/2012 1534  ?    ?Component Value Date/Time  ? CALCIUM 8.9 07/18/2021 0924  ? ALKPHOS 54 07/18/2021 0924  ? AST 14 (L) 07/18/2021 0924  ? ALT 15 07/18/2021 0924  ? BILITOT 0.4 07/18/2021 0947  ?  ? ? ? ?RADIOGRAPHIC STUDIES: ?No results found. ? ?ASSESSMENT AND PLAN: This is a very pleasant  66 years old white male with a stage IV (T1c, M1, M1 C) non-small cell lung cancer, adenosquamous carcinoma presented with left upper lobe lung nodule in addition to left hilar adenopathy and bone metastasis diagnosed in January 2023 with positive KRAS G12C mutation and PD-L1 expression of 10%. ?He is currently on systemic chemotherapy with carboplatin for AUC of 5, Alimta 500 Mg/M2 and Keytruda 200 Mg IV every 3 weeks.  First cycle June 30, 2021.  Status post 2 cycles.  The patient tolerated the second cycle of his treatment fairly well. ?I recommended for him to proceed with cycle #3 today as planned. ?I will see him back for follow-up visit in 3 weeks for evaluation with repeat CT scan of the chest, abdomen and pelvis for restaging of his disease. ?The patient was advised to call immediately if he has any other  concerning symptoms in the interval. ? ?The patient voices understanding of current disease status and treatment options and is in agreement with the current care plan. ? ?All questions were answered. The patient knows to call the clinic with any problems, questions or concerns. We can certainly see the patient much sooner if necessary. ? ?The total time spent in the appointment was 20 minutes. ? ?Disclaimer: This note was dictated with voice recognition software. Similar sounding words can inadvertently be transcribed and may not be corrected upon review. ? ? ?  ?   ?

## 2021-08-08 NOTE — Patient Instructions (Signed)
[facility]  Discharge Instructions: ?Thank you for choosing [facility] to provide your oncology and hematology care.  ? ?If you have a lab appointment with the [facility], please go directly to the [facility] and check in at the registration area. ?  ?Wear comfortable clothing and clothing appropriate for easy access to any Portacath or PICC line.  ? ?We strive to give you quality time with your provider. You may need to reschedule your appointment if you arrive late (15 or more minutes).  Arriving late affects you and other patients whose appointments are after yours.  Also, if you miss three or more appointments without notifying the office, you may be dismissed from the clinic at the provider?s discretion.    ?  ?For prescription refill requests, have your pharmacy contact our office and allow 72 hours for refills to be completed.   ? ?Today you received the following chemotherapy and/or immunotherapy agents Keytruda, Alimta, Carbo    ?  ?To help prevent nausea and vomiting after your treatment, we encourage you to take your nausea medication as directed. ? ?BELOW ARE SYMPTOMS THAT SHOULD BE REPORTED IMMEDIATELY: ?*FEVER GREATER THAN 100.4 F (38 ?C) OR HIGHER ?*CHILLS OR SWEATING ?*NAUSEA AND VOMITING THAT IS NOT CONTROLLED WITH YOUR NAUSEA MEDICATION ?*UNUSUAL SHORTNESS OF BREATH ?*UNUSUAL BRUISING OR BLEEDING ?*URINARY PROBLEMS (pain or burning when urinating, or frequent urination) ?*BOWEL PROBLEMS (unusual diarrhea, constipation, pain near the anus) ?TENDERNESS IN MOUTH AND THROAT WITH OR WITHOUT PRESENCE OF ULCERS (sore throat, sores in mouth, or a toothache) ?UNUSUAL RASH, SWELLING OR PAIN  ?UNUSUAL VAGINAL DISCHARGE OR ITCHING  ? ?Items with * indicate a potential emergency and should be followed up as soon as possible or go to the Emergency Department if any problems should occur. ? ?Please show the CHEMOTHERAPY ALERT CARD or IMMUNOTHERAPY ALERT CARD at  check-in to the Emergency Department and triage nurse. ? ?Should you have questions after your visit or need to cancel or reschedule your appointment, please contact [facility]  Dept: [phone]  and follow the prompts.  Office hours are 8:00 a.m. to 4:30 p.m. Monday - Friday. Please note that voicemails left after 4:00 p.m. may not be returned until the following business day.  We are closed weekends and major holidays. You have access to a nurse at all times for urgent questions. Please call the main number to the clinic Dept: [phone] and follow the prompts. ? ? ?For any non-urgent questions, you may also contact your provider using MyChart. We now offer e-Visits for anyone 30 and older to request care online for non-urgent symptoms. For details visit [email or web address]. ?  ?Also download the MyChart app! Go to the app store, search "MyChart", open the app, select [facility], and log in with your MyChart username and password. ? ?Due to Covid, a mask is required upon entering the hospital/clinic. If you do not have a mask, one will be given to you upon arrival. For doctor visits, patients may have 1 support person aged 55 or older with them. For treatment visits, patients cannot have anyone with them due to current Covid guidelines and our immunocompromised population.  ? ?

## 2021-08-10 ENCOUNTER — Other Ambulatory Visit: Payer: Medicare Other

## 2021-08-17 ENCOUNTER — Other Ambulatory Visit: Payer: Medicare Other

## 2021-08-24 ENCOUNTER — Ambulatory Visit: Payer: Medicare Other | Admitting: Internal Medicine

## 2021-08-24 ENCOUNTER — Ambulatory Visit: Payer: Medicare Other

## 2021-08-24 ENCOUNTER — Other Ambulatory Visit: Payer: Medicare Other

## 2021-08-29 ENCOUNTER — Encounter: Payer: Self-pay | Admitting: Internal Medicine

## 2021-08-31 ENCOUNTER — Ambulatory Visit (HOSPITAL_COMMUNITY)
Admission: RE | Admit: 2021-08-31 | Discharge: 2021-08-31 | Disposition: A | Discharge status: Home / Self Care | Payer: Medicare Other | Source: Ambulatory Visit | Attending: Internal Medicine | Admitting: Internal Medicine

## 2021-08-31 DIAGNOSIS — C3490 Malignant neoplasm of unspecified part of unspecified bronchus or lung: Secondary | ICD-10-CM | POA: Insufficient documentation

## 2021-08-31 IMAGING — CT CT ABD-PELV W/ CM
3 of 5 series · 13 of 36 positions shown, 15 images · IV contrast (OMNIPAQUE)
Comparison: PET-CT [DATE].

CLINICAL DATA: Non-small-cell lung cancer.  Restaging.

EXAM:
CT CHEST, ABDOMEN, AND PELVIS WITH CONTRAST
TECHNIQUE: Multidetector CT imaging of the chest, abdomen and pelvis was
performed following the standard protocol during bolus
administration of intravenous contrast.

[Series 2: cap with · axial · 0.92mm/px · z∈[-573,-38]mm · 8 of 139 slices shown, 10 images]
[im 16/139  mediastinal]
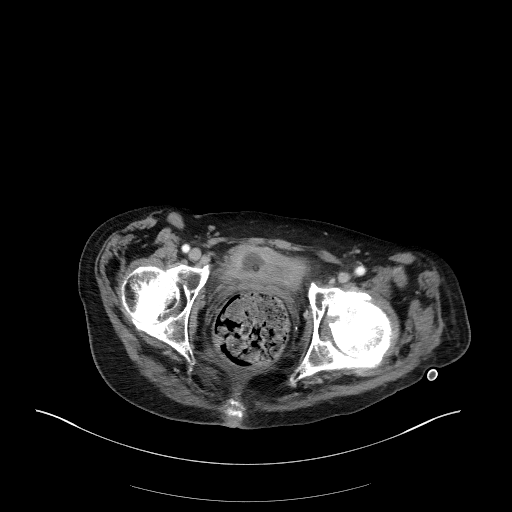
[im 16/139  lung]
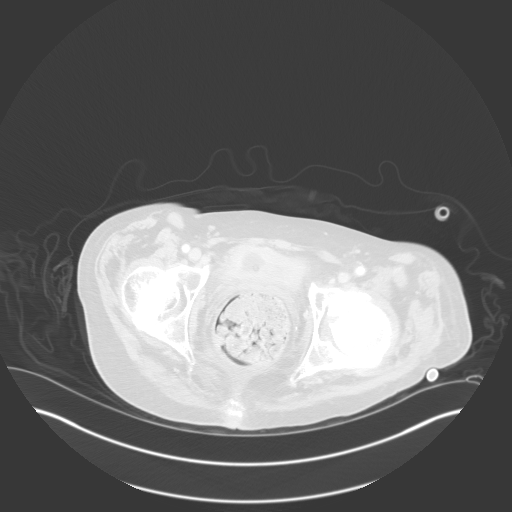
[im 31/139  lung]
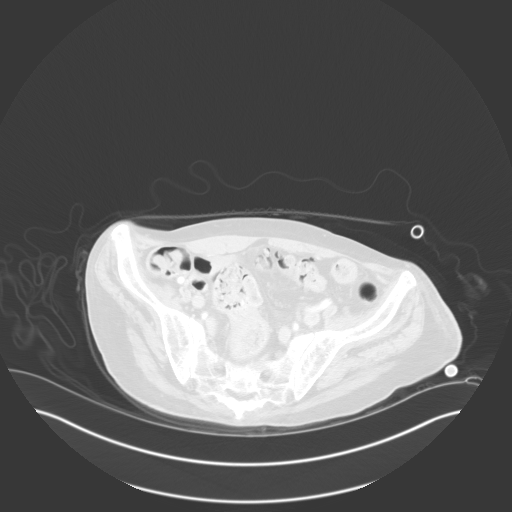
[im 47/139  lung]
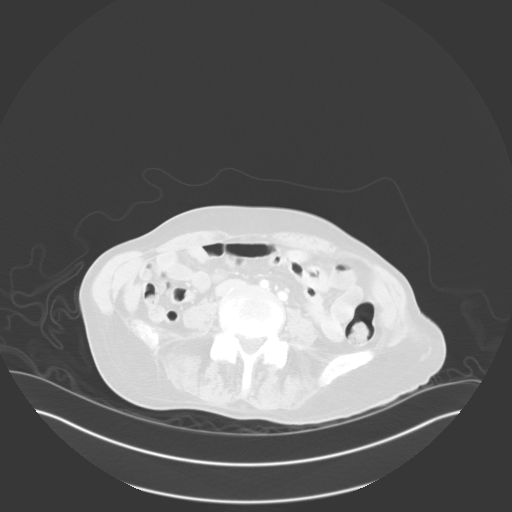
[im 62/139  lung]
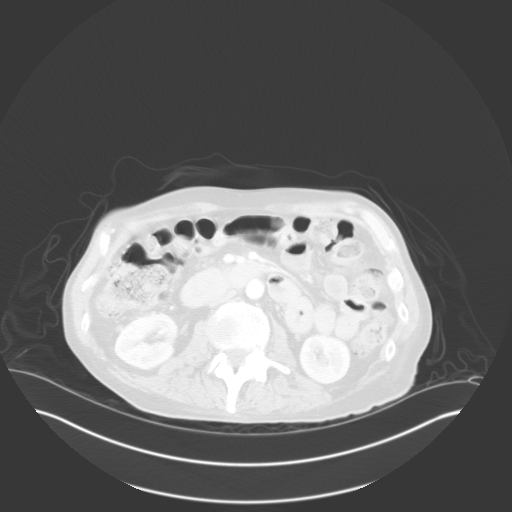
[im 77/139  mediastinal]
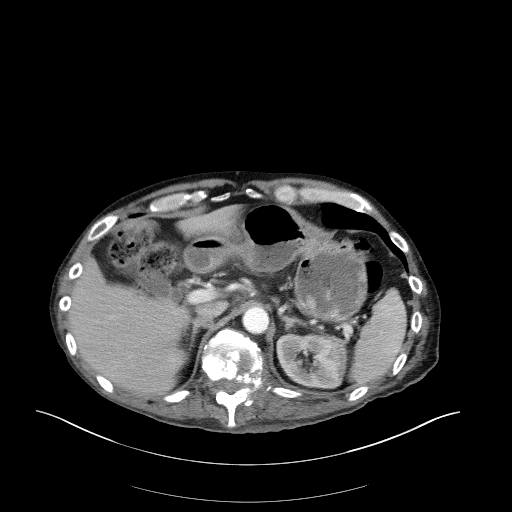
[im 77/139  lung]
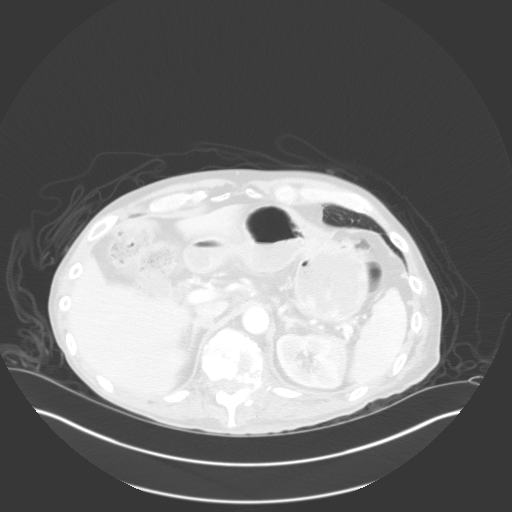
[im 93/139  lung]
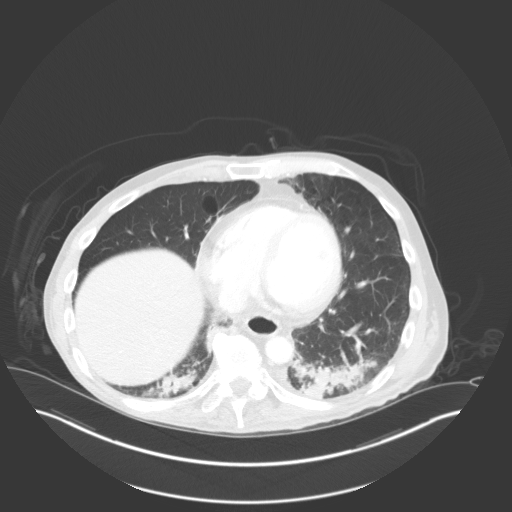
[im 108/139  lung]
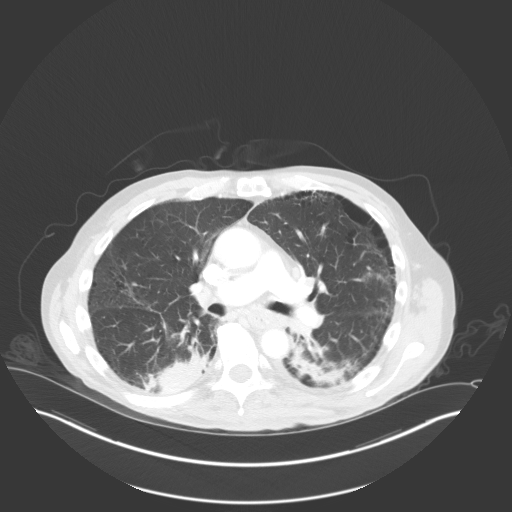
[im 123/139  lung]
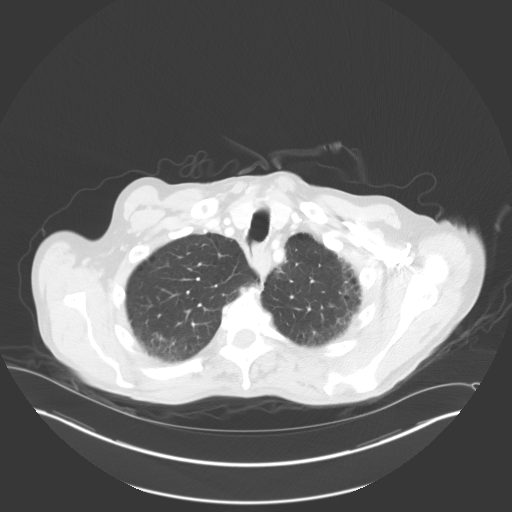

[Series 4: lung · axial · 0.92mm/px · z∈[-322,-266]mm · 2 of 196 slices shown]
[im 14/196  lung]
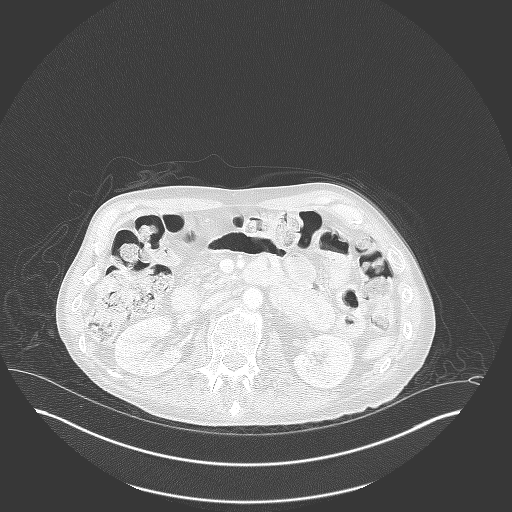
[im 42/196  lung]
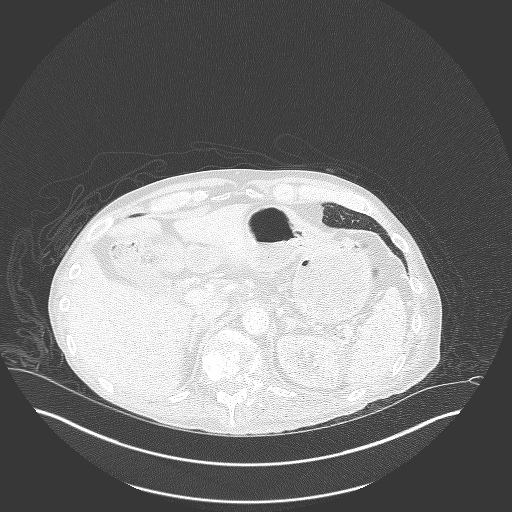

[Series 5: coronals · coronal · 0.83mm/px · 3 of 137 slices shown]
[im 28/137  lung]
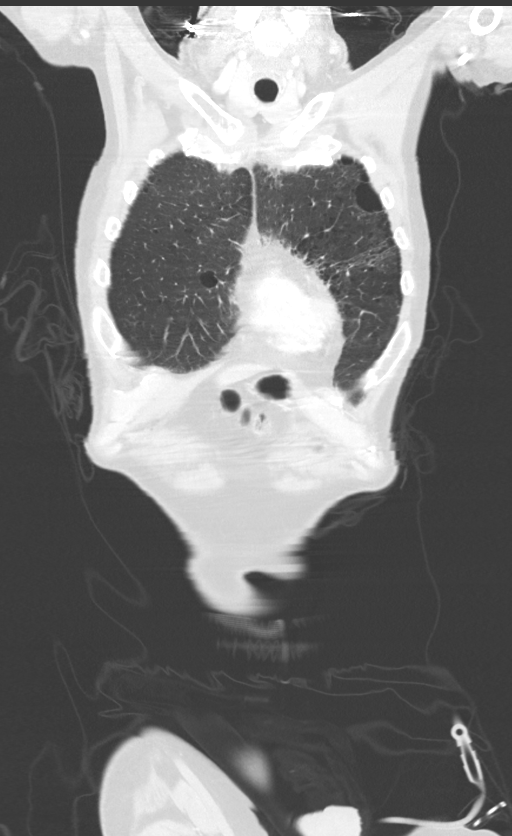
[im 55/137  lung]
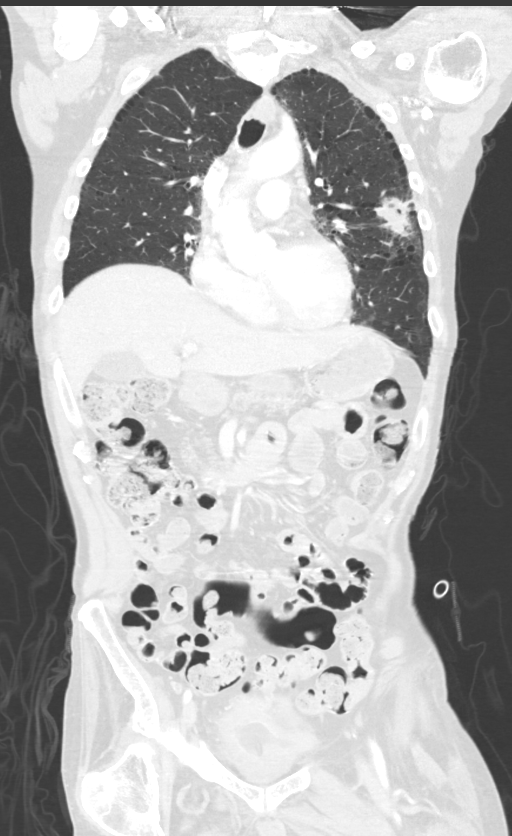
[im 82/137  lung]
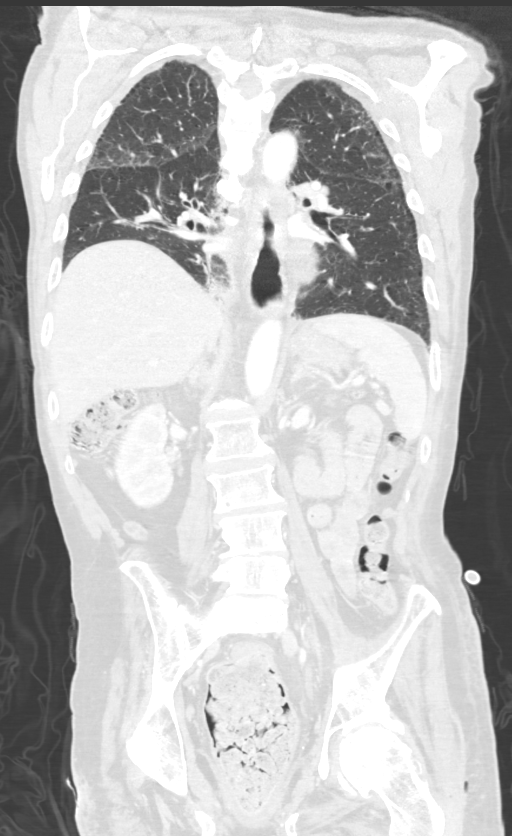

[13 of 36 positions shown; findings below may reference images not displayed]

RADIATION DOSE REDUCTION: This exam was performed according to the
departmental dose-optimization program which includes automated
exposure control, adjustment of the mA and/or kV according to
patient size and/or use of iterative reconstruction technique.

CONTRAST:  100mL OMNIPAQUE IOHEXOL 300 MG/ML  SOLN
FINDINGS: CT CHEST FINDINGS

Cardiovascular: The heart size is normal. No substantial pericardial
effusion. No thoracic aortic aneurysm. No substantial
atherosclerosis of the thoracic aorta.

Mediastinum/Nodes: No mediastinal lymphadenopathy. Small lymph nodes
are seen in the left hilum and subcarinal station. Mild
circumferential wall thickening noted mid esophagus. There is no
axillary lymphadenopathy.

Lungs/Pleura: Centrilobular and paraseptal emphysema evident.
Posterior left upper lobe lung lesion is progressive in the interval
measuring 4.1 x 2.7 cm today compared to 2.0 x 1.8 cm on the
[DATE] exam. Confluent patchy and nodular consolidative airspace
disease is seen in both lower lobes. No pleural effusion.

Musculoskeletal: Lytic and sclerotic thoracic spine lesions are
again noted. Diffuse sclerosis noted in the T5 vertebral body. 2 cm
lucent lesion identified at T9. Compression fracture T12 is probably
pathologic.

CT ABDOMEN PELVIS FINDINGS

Hepatobiliary: No suspicious focal abnormality within the liver
parenchyma. There is no evidence for gallstones, gallbladder wall
thickening, or pericholecystic fluid. No intrahepatic or
extrahepatic biliary dilation.

Pancreas: No focal mass lesion. No dilatation of the main duct. No
intraparenchymal cyst. No peripancreatic edema.

Spleen: No splenomegaly. No focal mass lesion.

Adrenals/Urinary Tract: Left adrenal gland unremarkable. Tiny right
adrenal nodule is similar to prior. Kidneys unremarkable. No
evidence for hydroureter. Bladder is decompressed by Foley catheter.

Stomach/Bowel: Stomach is unremarkable. No gastric wall thickening.
No evidence of outlet obstruction. Duodenum is normally positioned
as is the ligament of Treitz. No small bowel wall thickening. No
small bowel dilatation. The terminal ileum is normal. The appendix
is normal. No gross colonic mass. No colonic wall thickening. Large
stool volume throughout raises the question of clinical
constipation.

Vascular/Lymphatic: There is mild atherosclerotic calcification of
the abdominal aorta without aneurysm. There is no gastrohepatic or
hepatoduodenal ligament lymphadenopathy. No retroperitoneal or
mesenteric lymphadenopathy. No pelvic sidewall lymphadenopathy.
Diminutive left common iliac vein evident.

Reproductive: The prostate gland and seminal vesicles are
unremarkable.

Other: No intraperitoneal free fluid.

Musculoskeletal: Bones are diffusely demineralized with degenerative
changes in both hips, left greater than right. Small sclerotic
lesion in the T1 vertebral body (69/2) is new in the interval
IMPRESSION: 1. Interval progression of the posterior left upper lobe lung lesion
with small left hilar and subcarinal lymph nodes.
2. Confluent patchy and nodular consolidative airspace disease in
both lower lobes. Imaging features are compatible with multifocal
pneumonia. Attention on follow-up recommended.
3. multiple lytic and sclerotic thoracic spine lesions again
identified compatible with metastatic disease. There is a new
sclerotic lesion in the T1 vertebral body. Compression fracture T12
is probably pathologic.
4. Large stool volume throughout the colon raises the question of
clinical constipation.
5. Tiny right adrenal nodule, similar to prior. Attention on
follow-up recommended.
6. Aortic Atherosclerosis ([EM]-[EM]) and Emphysema ([EM]-[EM]).

## 2021-08-31 IMAGING — CT CT CHEST W/ CM
3 of 5 series · 13 of 36 positions shown, 15 images · IV contrast (OMNIPAQUE)
Comparison: PET-CT [DATE].

CLINICAL DATA: Non-small-cell lung cancer.  Restaging.

EXAM:
CT CHEST, ABDOMEN, AND PELVIS WITH CONTRAST
TECHNIQUE: Multidetector CT imaging of the chest, abdomen and pelvis was
performed following the standard protocol during bolus
administration of intravenous contrast.

[Series 2: cap with · axial · 0.92mm/px · z∈[-573,-38]mm · 8 of 139 slices shown, 10 images]
[im 16/139  mediastinal]
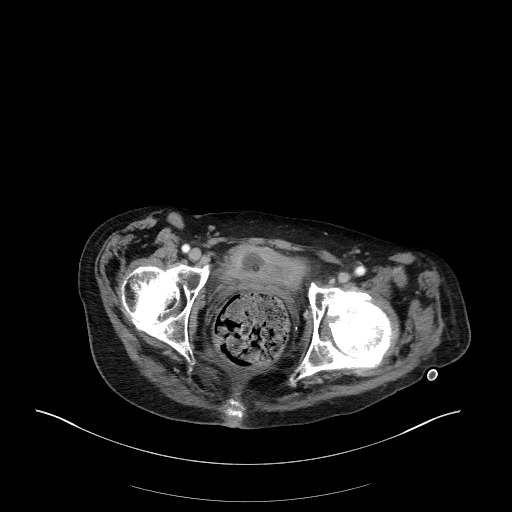
[im 16/139  lung]
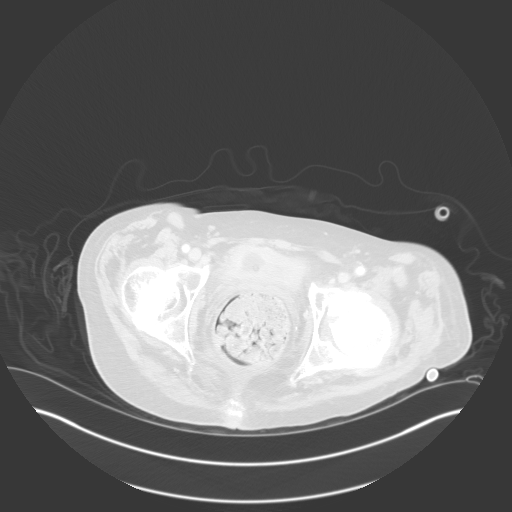
[im 31/139  lung]
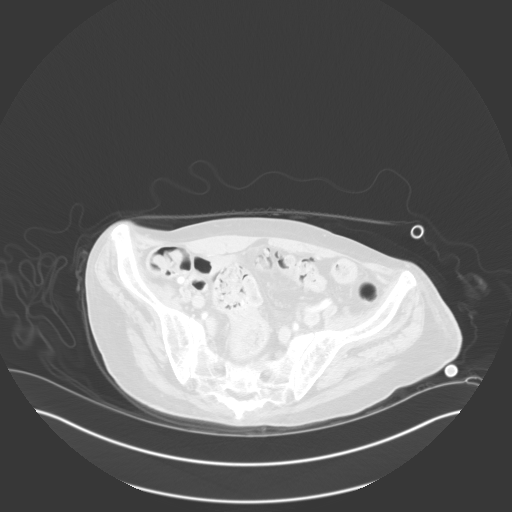
[im 47/139  lung]
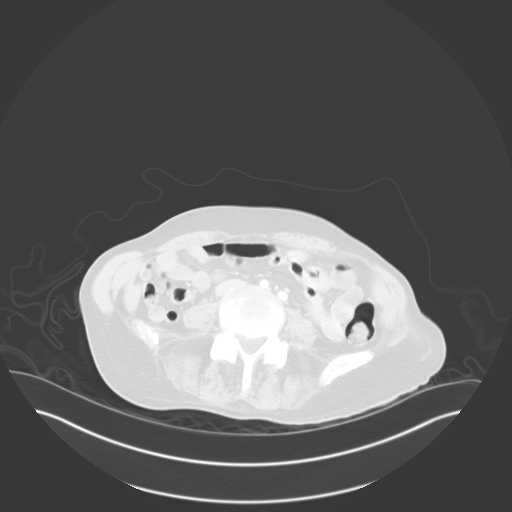
[im 62/139  lung]
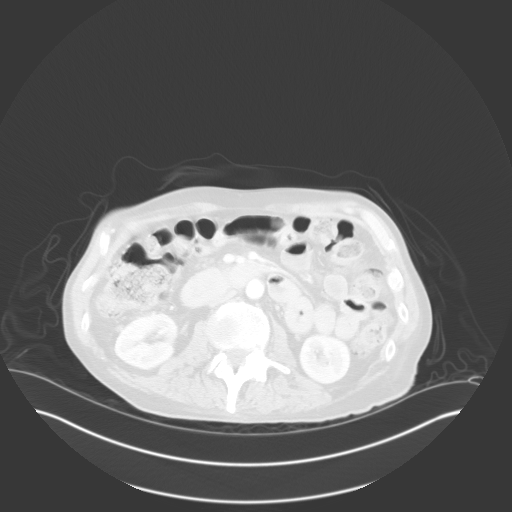
[im 77/139  mediastinal]
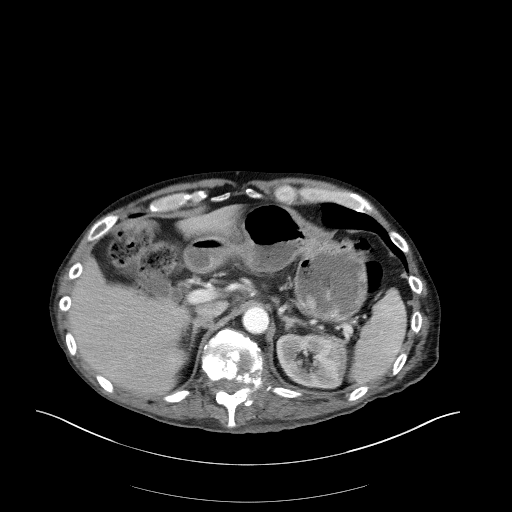
[im 77/139  lung]
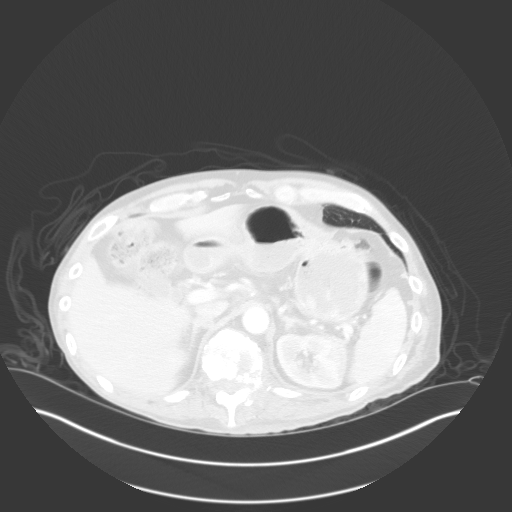
[im 93/139  lung]
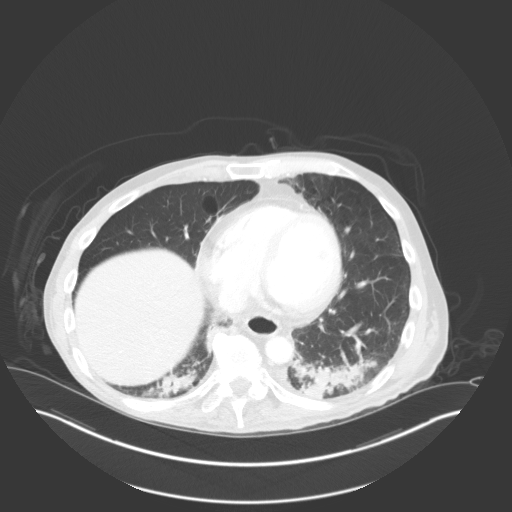
[im 108/139  lung]
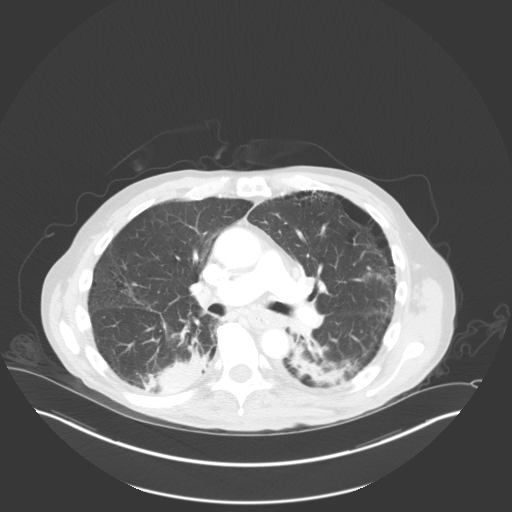
[im 123/139  lung]
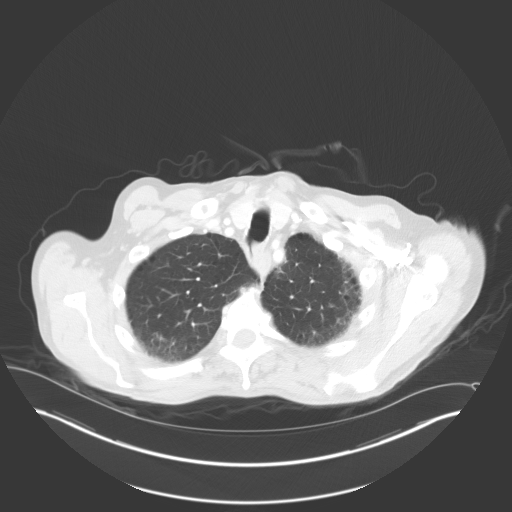

[Series 4: lung · axial · 0.92mm/px · z∈[-322,-266]mm · 2 of 196 slices shown]
[im 14/196  lung]
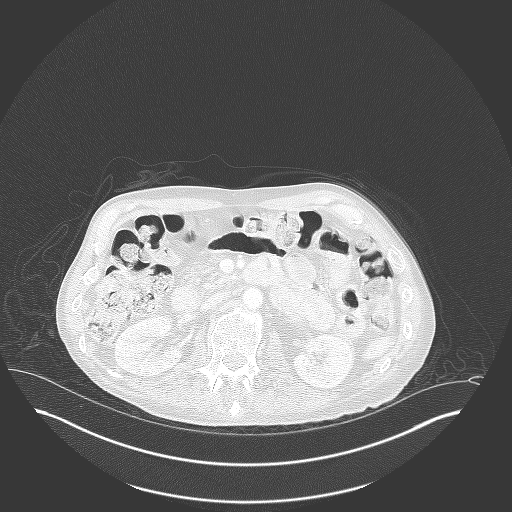
[im 42/196  lung]
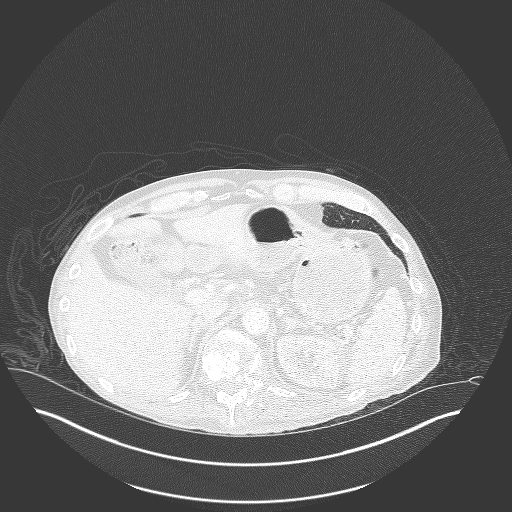

[Series 5: coronals · coronal · 0.83mm/px · 3 of 137 slices shown]
[im 28/137  lung]
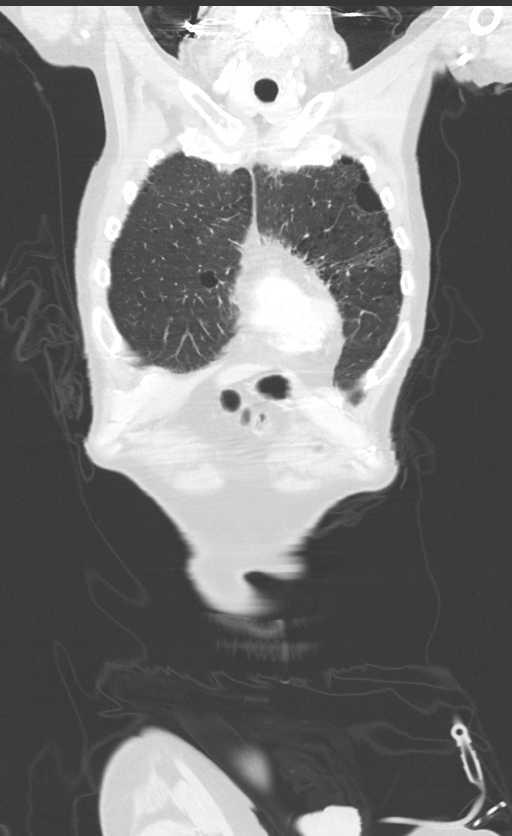
[im 55/137  lung]
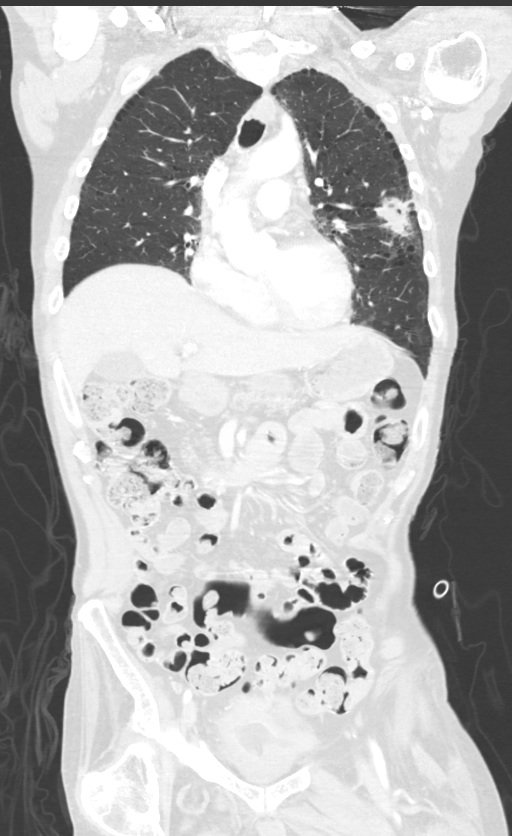
[im 82/137  lung]
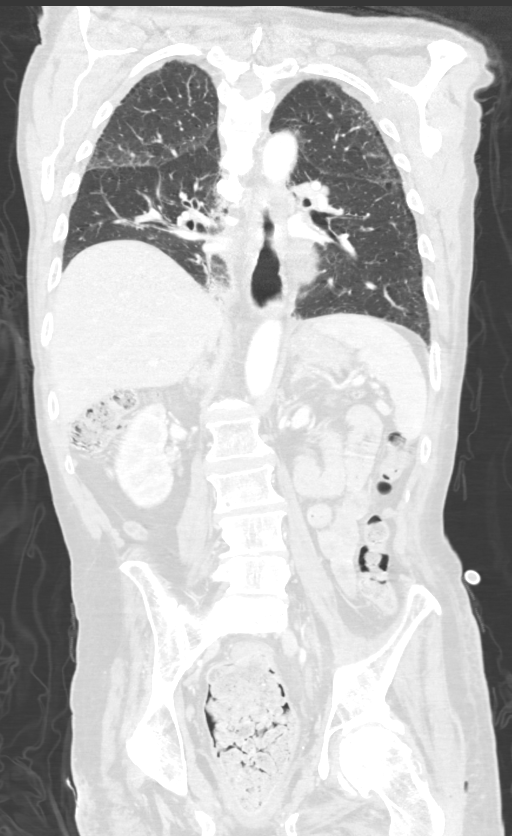

[13 of 36 positions shown; findings below may reference images not displayed]

RADIATION DOSE REDUCTION: This exam was performed according to the
departmental dose-optimization program which includes automated
exposure control, adjustment of the mA and/or kV according to
patient size and/or use of iterative reconstruction technique.

CONTRAST:  100mL OMNIPAQUE IOHEXOL 300 MG/ML  SOLN
FINDINGS: CT CHEST FINDINGS

Cardiovascular: The heart size is normal. No substantial pericardial
effusion. No thoracic aortic aneurysm. No substantial
atherosclerosis of the thoracic aorta.

Mediastinum/Nodes: No mediastinal lymphadenopathy. Small lymph nodes
are seen in the left hilum and subcarinal station. Mild
circumferential wall thickening noted mid esophagus. There is no
axillary lymphadenopathy.

Lungs/Pleura: Centrilobular and paraseptal emphysema evident.
Posterior left upper lobe lung lesion is progressive in the interval
measuring 4.1 x 2.7 cm today compared to 2.0 x 1.8 cm on the
[DATE] exam. Confluent patchy and nodular consolidative airspace
disease is seen in both lower lobes. No pleural effusion.

Musculoskeletal: Lytic and sclerotic thoracic spine lesions are
again noted. Diffuse sclerosis noted in the T5 vertebral body. 2 cm
lucent lesion identified at T9. Compression fracture T12 is probably
pathologic.

CT ABDOMEN PELVIS FINDINGS

Hepatobiliary: No suspicious focal abnormality within the liver
parenchyma. There is no evidence for gallstones, gallbladder wall
thickening, or pericholecystic fluid. No intrahepatic or
extrahepatic biliary dilation.

Pancreas: No focal mass lesion. No dilatation of the main duct. No
intraparenchymal cyst. No peripancreatic edema.

Spleen: No splenomegaly. No focal mass lesion.

Adrenals/Urinary Tract: Left adrenal gland unremarkable. Tiny right
adrenal nodule is similar to prior. Kidneys unremarkable. No
evidence for hydroureter. Bladder is decompressed by Foley catheter.

Stomach/Bowel: Stomach is unremarkable. No gastric wall thickening.
No evidence of outlet obstruction. Duodenum is normally positioned
as is the ligament of Treitz. No small bowel wall thickening. No
small bowel dilatation. The terminal ileum is normal. The appendix
is normal. No gross colonic mass. No colonic wall thickening. Large
stool volume throughout raises the question of clinical
constipation.

Vascular/Lymphatic: There is mild atherosclerotic calcification of
the abdominal aorta without aneurysm. There is no gastrohepatic or
hepatoduodenal ligament lymphadenopathy. No retroperitoneal or
mesenteric lymphadenopathy. No pelvic sidewall lymphadenopathy.
Diminutive left common iliac vein evident.

Reproductive: The prostate gland and seminal vesicles are
unremarkable.

Other: No intraperitoneal free fluid.

Musculoskeletal: Bones are diffusely demineralized with degenerative
changes in both hips, left greater than right. Small sclerotic
lesion in the T1 vertebral body (69/2) is new in the interval
IMPRESSION: 1. Interval progression of the posterior left upper lobe lung lesion
with small left hilar and subcarinal lymph nodes.
2. Confluent patchy and nodular consolidative airspace disease in
both lower lobes. Imaging features are compatible with multifocal
pneumonia. Attention on follow-up recommended.
3. multiple lytic and sclerotic thoracic spine lesions again
identified compatible with metastatic disease. There is a new
sclerotic lesion in the T1 vertebral body. Compression fracture T12
is probably pathologic.
4. Large stool volume throughout the colon raises the question of
clinical constipation.
5. Tiny right adrenal nodule, similar to prior. Attention on
follow-up recommended.
6. Aortic Atherosclerosis ([EM]-[EM]) and Emphysema ([EM]-[EM]).

## 2021-08-31 MED ORDER — SODIUM CHLORIDE (PF) 0.9 % IJ SOLN
INTRAMUSCULAR | Status: AC
  Filled 2021-08-31: qty 50

## 2021-08-31 MED ORDER — IOHEXOL 300 MG/ML  SOLN
100.0000 mL | Freq: Once | INTRAMUSCULAR | Status: AC | PRN
  Administered 2021-08-31: 100 mL via INTRAVENOUS

## 2021-08-31 NOTE — Progress Notes (Signed)
[facility] ?OFFICE PROGRESS NOTE ? ?Christopher Maw, MD ?[place] ?[place] [address] ? ?DIAGNOSIS:  Stage IV (T1c, N1, M1 C) non-small cell lung cancer, adenosquamous carcinoma presented with left upper lobe lung nodule in addition to left hilar adenopathy and bone metastasis diagnosed January 2023. ?  ?Molecular studies by foundation 1 showed positive KRAS G12C mutation ?  ?PD-L1 expression 10% ? ?PRIOR THERAPY:  ?1) Palliative radiotherapy to the T12 destructive pathologic fracture under the care of Dr. Tammi Klippel completed on May 10, 2021. ?2) Systemic chemotherapy with carboplatin for AUC of 5, Alimta 500 Mg/M2 and Keytruda 200 Mg IV every 3 weeks. Last dose on 08/08/21. Status post 3 cycles. Discontinued due to disease progression.  ? ?CURRENT THERAPY:  Krazati p.o. daily. First dose expected in next few days.  ?  ? ?INTERVAL HISTORY: ?Christopher Dawson 66 y.o. male returns to the clinic today for a follow-up visit.  The patient is feeling fair today without any concerning complaints except he mentions in the last week he was found to have pneumonia after he was endorsing new cough.  He states he was treated with "injections" , likely Rocephin x3.  He states that his cough improved but he started coughing again last night and he is concerned that he may have recurrent pneumonia.  The patient denies any allergies to any antibiotics.  He also mentions today that he is more fatigued than usual.  He has been having some downtrending hemoglobin throughout the course of treatment.  His hemoglobin is 7.9 today. ? ?The patient presented with weakness in his lower extremities secondary to significant thoracic and lumbar metastatic lesions with pathologic fractures and epidural tumor resulting and spinal stenosis.  Therefore, the patient is currently a resident at a skilled nursing facility [facility]). He comes by on a stretcher to his appointments.  He is still unable to bear  weight, walk, or get himself out of bed. Because of the patient's lower extremity weakness, he is unable to perform physical therapy.  He also has significant pain with movement of his lower extremity. ? ?He is currently undergoing systemic chemotherapy and immunotherapy for his stage IV cancer.  He status post 3 cycles.  He is tolerating fairly well except for some fatigue.  Today he denies any fever, chills, or night sweats.  He thinks he may have lost weight.  The patient has a sister who lives in [place] but otherwise lives in the skilled nursing facility.  He does not drink any supplemental drinks.  Denies any significant dyspnea as he is nonambulatory/active.  Denies any hemoptysis or chest pain.  Denies any nausea, vomiting, or diarrhea.  The patient reports constipation.  He does use fentanyl patches.  He was thinking of having his provider at his skilled nursing facility increase his dose.  His skilled nursing facility gives him reportedly 3 medications for constipation.  Denies any headache or visual changes.  He denies any rashes or skin changes.  The patient recently had a restaging CT scan performed.  He is here today for evaluation to review his scan results before starting cycle #4. ? ? ? ?MEDICAL HISTORY: ?Past Medical History:  ?Diagnosis Date  ? Anxiety   ? Depression   ? Lumbar radiculopathy, chronic 2008  ? Marfan syndrome   ? Neurosis, depressive   ? Shortening, leg, congenital   ? ? ?ALLERGIES:  is allergic to codeine. ? ?MEDICATIONS:  ?Current Outpatient Medications  ?Medication Sig Dispense Refill  ?  Adagrasib (KRAZATI) 200 MG TABS Take 600 mg by mouth in the morning and at bedtime. 3 tablet p.o. twice daily 180 tablet 3  ? doxycycline (VIBRA-TABS) 100 MG tablet Take 1 tablet (100 mg total) by mouth 2 (two) times daily. 14 tablet 0  ? acetaminophen (TYLENOL) 325 MG tablet Take 2 tablets (650 mg total) by mouth every 6 (six) hours as needed for mild pain (or Fever >/= 101). 30 tablet 0  ?  bisacodyl (DULCOLAX) 5 MG EC tablet Take 1 tablet (5 mg total) by mouth daily. 30 tablet 0  ? clomiPRAMINE (ANAFRANIL) 25 MG capsule Take 4 capsules (100 mg total) by mouth at bedtime. As needed. 30 capsule 2  ? feeding supplement (ENSURE ENLIVE / ENSURE PLUS) LIQD Take 237 mLs by mouth 3 (three) times daily between meals. 237 mL 12  ? fentaNYL (DURAGESIC) 25 MCG/HR Place 1 patch onto the skin every 3 (three) days.    ? folic acid (FOLVITE) 1 MG tablet Take 1 tablet (1 mg total) by mouth daily. 30 tablet 4  ? gabapentin (NEURONTIN) 100 MG capsule Take 1 capsule (100 mg total) by mouth 3 (three) times daily. 90 capsule 0  ? hydrOXYzine (ATARAX) 25 MG tablet Take 1 tablet (25 mg total) by mouth 3 (three) times daily as needed for anxiety. 30 tablet 0  ? lactulose (CHRONULAC) 10 GM/15ML solution Take 45 mLs (30 g total) by mouth 2 (two) times daily. 236 mL 0  ? Multiple Vitamin (MULTIVITAMIN WITH MINERALS) TABS tablet Take 1 tablet by mouth daily. 30 tablet 0  ? Multiple Vitamins-Minerals (DECUBI-VITE) CAPS Take by mouth.    ? nicotine (NICODERM CQ - DOSED IN MG/24 HOURS) 14 mg/24hr patch Place 1 patch (14 mg total) onto the skin daily. 28 patch 0  ? ondansetron (ZOFRAN) 4 MG tablet Take 1 tablet (4 mg total) by mouth every 6 (six) hours as needed for nausea. 20 tablet 0  ? oxyCODONE (OXY IR/ROXICODONE) 5 MG immediate release tablet Take 1-2 tablets (5-10 mg total) by mouth every 4 (four) hours as needed for moderate pain. 10 tablet 0  ? pantoprazole (PROTONIX) 40 MG tablet Take 1 tablet (40 mg total) by mouth daily. 30 tablet 0  ? prochlorperazine (COMPAZINE) 10 MG tablet Take 1 tablet (10 mg total) by mouth every 6 (six) hours as needed for nausea or vomiting. 30 tablet 0  ? senna-docusate (SENOKOT-S) 8.6-50 MG tablet Take 1 tablet by mouth 2 (two) times daily. 30 tablet 0  ? tamsulosin (FLOMAX) 0.4 MG CAPS capsule Take 1 capsule (0.4 mg total) by mouth daily after supper. 30 capsule 0  ? thiamine 100 MG tablet Take  1 tablet (100 mg total) by mouth daily. 30 tablet 0  ? ?No current facility-administered medications for this visit.  ? ? ?SURGICAL HISTORY:  ?Past Surgical History:  ?Procedure Laterality Date  ? LEG SURGERY    ? ? ?REVIEW OF SYSTEMS:   ?Review of Systems  ?Constitutional: Positive for fatigue.  Positive for decreased appetite.  Negative for chills, fever and unexpected weight change.  ?HENT: Negative for mouth sores, nosebleeds, sore throat and trouble swallowing.   ?Eyes: Negative for eye problems and icterus.  ?Respiratory: Positive for worsening cough. Negative for hemoptysis, shortness of breath and wheezing.   ?Cardiovascular: Negative for chest pain and leg swelling.  ?Gastrointestinal: Positive for constipation.  Negative for abdominal pain,  diarrhea, nausea and vomiting.  ?Genitourinary: Negative for bladder incontinence, difficulty urinating, dysuria, frequency and hematuria.   ?  Musculoskeletal: Negative for back pain, gait problem, neck pain and neck stiffness.  ?Skin: Negative for itching and rash.  ?Neurological: Negative for dizziness, extremity weakness, gait problem, headaches, light-headedness and seizures.  ?Hematological: Negative for adenopathy. Does not bruise/bleed easily.  ?Psychiatric/Behavioral: Negative for confusion, depression and sleep disturbance. The patient is not nervous/anxious.   ? ? ?PHYSICAL EXAMINATION:  ?There were no vitals taken for this visit. ? ?ECOG PERFORMANCE STATUS: 3 ? ?Physical Exam  ?Constitutional: Oriented to person, place, and time and thin appearing male and in no distress.  ?HENT:  ?Head: Normocephalic and atraumatic.  ?Mouth/Throat: Oropharynx is clear and moist. No oropharyngeal exudate.  ?Eyes: Conjunctivae are normal. Right eye exhibits no discharge. Left eye exhibits no discharge. No scleral icterus.  ?Neck: Normal range of motion. Neck supple.  ?Cardiovascular: Normal rate, regular rhythm, normal heart sounds and intact distal pulses.   ?Pulmonary/Chest:  Effort normal.  Quiet breath sounds bilaterally. no respiratory distress. No wheezes. No rales.  ?Abdominal: Soft. Bowel sounds are normal. Exhibits no distension and no mass. There is no tenderness.  ?Mus

## 2021-09-01 ENCOUNTER — Inpatient Hospital Stay: Payer: Medicare Other | Attending: Physician Assistant

## 2021-09-01 ENCOUNTER — Telehealth: Payer: Self-pay | Admitting: Pharmacist

## 2021-09-01 ENCOUNTER — Other Ambulatory Visit: Payer: Self-pay

## 2021-09-01 ENCOUNTER — Encounter: Payer: Self-pay | Admitting: Internal Medicine

## 2021-09-01 ENCOUNTER — Telehealth: Payer: Self-pay

## 2021-09-01 ENCOUNTER — Inpatient Hospital Stay: Payer: Medicare Other

## 2021-09-01 ENCOUNTER — Ambulatory Visit: Payer: Self-pay

## 2021-09-01 ENCOUNTER — Ambulatory Visit: Payer: Medicare Other | Admitting: Physician Assistant

## 2021-09-01 ENCOUNTER — Inpatient Hospital Stay (HOSPITAL_BASED_OUTPATIENT_CLINIC_OR_DEPARTMENT_OTHER): Payer: Medicare Other | Admitting: Physician Assistant

## 2021-09-01 ENCOUNTER — Other Ambulatory Visit (HOSPITAL_COMMUNITY): Payer: Self-pay

## 2021-09-01 ENCOUNTER — Other Ambulatory Visit: Payer: Medicare Other

## 2021-09-01 DIAGNOSIS — J189 Pneumonia, unspecified organism: Secondary | ICD-10-CM | POA: Insufficient documentation

## 2021-09-01 DIAGNOSIS — C3492 Malignant neoplasm of unspecified part of left bronchus or lung: Secondary | ICD-10-CM

## 2021-09-01 DIAGNOSIS — C3412 Malignant neoplasm of upper lobe, left bronchus or lung: Secondary | ICD-10-CM | POA: Diagnosis present

## 2021-09-01 DIAGNOSIS — Z923 Personal history of irradiation: Secondary | ICD-10-CM | POA: Diagnosis not present

## 2021-09-01 DIAGNOSIS — D6481 Anemia due to antineoplastic chemotherapy: Secondary | ICD-10-CM

## 2021-09-01 DIAGNOSIS — R5383 Other fatigue: Secondary | ICD-10-CM | POA: Diagnosis not present

## 2021-09-01 DIAGNOSIS — T451X5D Adverse effect of antineoplastic and immunosuppressive drugs, subsequent encounter: Secondary | ICD-10-CM

## 2021-09-01 DIAGNOSIS — T451X5A Adverse effect of antineoplastic and immunosuppressive drugs, initial encounter: Secondary | ICD-10-CM | POA: Insufficient documentation

## 2021-09-01 DIAGNOSIS — C7951 Secondary malignant neoplasm of bone: Secondary | ICD-10-CM | POA: Diagnosis not present

## 2021-09-01 DIAGNOSIS — C3490 Malignant neoplasm of unspecified part of unspecified bronchus or lung: Secondary | ICD-10-CM

## 2021-09-01 DIAGNOSIS — K5900 Constipation, unspecified: Secondary | ICD-10-CM | POA: Diagnosis not present

## 2021-09-01 LAB — CBC WITH DIFFERENTIAL (CANCER CENTER ONLY)
Abs Immature Granulocytes: 0.05 10*3/uL (ref 0.00–0.07)
Basophils Absolute: 0 10*3/uL (ref 0.0–0.1)
Basophils Relative: 0 %
Eosinophils Absolute: 0.3 10*3/uL (ref 0.0–0.5)
Eosinophils Relative: 3 %
HCT: 25.5 % — ABNORMAL LOW (ref 39.0–52.0)
Hemoglobin: 7.9 g/dL — ABNORMAL LOW (ref 13.0–17.0)
Immature Granulocytes: 0 %
Lymphocytes Relative: 3 %
Lymphs Abs: 0.4 10*3/uL — ABNORMAL LOW (ref 0.7–4.0)
MCH: 27.4 pg (ref 26.0–34.0)
MCHC: 31 g/dL (ref 30.0–36.0)
MCV: 88.5 fL (ref 80.0–100.0)
Monocytes Absolute: 1.2 10*3/uL — ABNORMAL HIGH (ref 0.1–1.0)
Monocytes Relative: 10 %
Neutro Abs: 10 10*3/uL — ABNORMAL HIGH (ref 1.7–7.7)
Neutrophils Relative %: 84 %
Platelet Count: 371 10*3/uL (ref 150–400)
RBC: 2.88 MIL/uL — ABNORMAL LOW (ref 4.22–5.81)
RDW: 21.6 % — ABNORMAL HIGH (ref 11.5–15.5)
WBC Count: 12 10*3/uL — ABNORMAL HIGH (ref 4.0–10.5)
nRBC: 0 % (ref 0.0–0.2)

## 2021-09-01 LAB — CMP (CANCER CENTER ONLY)
ALT: 11 U/L (ref 0–44)
AST: 11 U/L — ABNORMAL LOW (ref 15–41)
Albumin: 3.1 g/dL — ABNORMAL LOW (ref 3.5–5.0)
Alkaline Phosphatase: 55 U/L (ref 38–126)
Anion gap: 7 (ref 5–15)
BUN: 22 mg/dL (ref 8–23)
CO2: 26 mmol/L (ref 22–32)
Calcium: 9.1 mg/dL (ref 8.9–10.3)
Chloride: 102 mmol/L (ref 98–111)
Creatinine: 0.45 mg/dL — ABNORMAL LOW (ref 0.61–1.24)
GFR, Estimated: >60 mL/min (ref >60)
Glucose, Bld: 129 mg/dL — ABNORMAL HIGH (ref 70–99)
Potassium: 3.5 mmol/L (ref 3.5–5.1)
Sodium: 135 mmol/L (ref 135–145)
Total Bilirubin: 0.3 mg/dL (ref 0.3–1.2)
Total Protein: 6.3 g/dL — ABNORMAL LOW (ref 6.5–8.1)

## 2021-09-01 LAB — PREPARE RBC (CROSSMATCH)

## 2021-09-01 LAB — TSH: TSH: 0.948 u[IU]/mL (ref 0.350–4.500)

## 2021-09-01 MED ORDER — SODIUM CHLORIDE 0.9% IV SOLUTION
250.0000 mL | Freq: Once | INTRAVENOUS | Status: AC
  Administered 2021-09-01: 250 mL via INTRAVENOUS

## 2021-09-01 MED ORDER — DOXYCYCLINE HYCLATE 100 MG PO TABS: 100.0000 mg | ORAL_TABLET | Freq: Two times a day (BID) | ORAL | 0 refills | Status: DC

## 2021-09-01 MED ORDER — KRAZATI 200 MG PO TABS
600.0000 mg | ORAL_TABLET | Freq: Two times a day (BID) | ORAL | 3 refills | Status: DC
Start: 2021-09-01 — End: 2021-09-05
  Filled 2021-09-01 – 2021-09-05 (×2): qty 180, 30d supply, fill #0

## 2021-09-01 MED ORDER — ACETAMINOPHEN 325 MG PO TABS
650.0000 mg | ORAL_TABLET | Freq: Once | ORAL | Status: AC
  Administered 2021-09-01: 650 mg via ORAL
  Filled 2021-09-01: qty 2

## 2021-09-01 MED ORDER — DIPHENHYDRAMINE HCL 25 MG PO CAPS
25.0000 mg | ORAL_CAPSULE | Freq: Once | ORAL | Status: AC
  Administered 2021-09-01: 25 mg via ORAL
  Filled 2021-09-01: qty 1

## 2021-09-01 NOTE — Telephone Encounter (Signed)
Oral Oncology Pharmacist Encounter ? ?Received new prescription for [name] [facility]) for the treatment of metastatic, non-small cell lung cancer, KRAS G12C mutated, planned duration until disease progression or unacceptable drug toxicity. ? ?CBC w/ Diff and CMP from 09/01/21 assessed, no baseline dose adjustments required. Baseline EKG will be obtained in office 09/01/21. Prescription dose and frequency assessed for appropriateness. ? ?Current medication list in Epic reviewed, DDIs with [name] identified: ?Category D drug-drug interaction between [place] and Fentanyl patch - Krazati a strong CYP3A4 inhibitor may increase serum concentrations of Fentanyl, thus increasing risk of side effects such as somnolence, sedation, and risk of respiratory depression. On chart review patient was recently dose increased from 12.5 to 25 mcg Fentanyl due to increase need for breakthrough pain medications. Would recommend considering dose reduction of Fentanyl patch when starting Krazati until [place] has reached steady state, and then consideration of dose adjustments of fentanyl patch thereafter based on pain management need.  ?Category C drug-drug interaction between [place] and Oxycodone - Krazati a strong CYP3A4 inhibitor may increase serum concentrations of oxycodne, thus increasing risk of side effects noted above. Noted oxycodone only used PRN for breakthrough pain.  ?Category D drug-drug interaction between [place] and clomipramine as well as Krazati and ondansetron due to risk of Qtc prolongation (Krazati category C DDI for Qtc prolongation with hydroxyzine as well) - baseline EKG to be obtained in office 09/01/21. ?Concomitant use of strong CYP3A4 inhibitors, like Krazati, not recommended in combination with tamsulosin per tamsulosin package insert. Risk of interaction greater when concomitant CYP2D6 inhibitor also utilized. Would recommend close monitoring of increased side effects of hypotension, dizziness, decreased  HR if tamsulosin cannot be discontinued.    ? ?Evaluated chart and no patient barriers to medication adherence noted.  ? ?Patient agreement for treatment documented in MD note on 09/01/21. ? ?Prescription has been e-scribed to the [facility] for benefits analysis and approval. ? ?Oral Oncology Clinic will continue to follow for insurance authorization, copayment issues, initial counseling and start date. ? ?Leron Croak, PharmD, BCPS ?Hematology/Oncology Clinical Pharmacist ?[facility] Oral Chemotherapy Navigation Clinics ?[phone] ?09/01/2021 12:35 PM ? ?

## 2021-09-01 NOTE — Patient Instructions (Signed)
Blood Transfusion, Adult, Care After This sheet gives you information about how to care for yourself after your procedure. Your doctor may also give you more specific instructions. If you have problems or questions, contact your doctor. What can I expect after the procedure? After the procedure, it is common to have: Bruising and soreness at the IV site. A headache. Follow these instructions at home: Insertion site care     Follow instructions from your doctor about how to take care of your insertion site. This is where an IV tube was put into your vein. Make sure you: Wash your hands with soap and water before and after you change your bandage (dressing). If you cannot use soap and water, use hand sanitizer. Change your bandage as told by your doctor. Check your insertion site every day for signs of infection. Check for: Redness, swelling, or pain. Bleeding from the site. Warmth. Pus or a bad smell. General instructions Take over-the-counter and prescription medicines only as told by your doctor. Rest as told by your doctor. Go back to your normal activities as told by your doctor. Keep all follow-up visits as told by your doctor. This is important. Contact a doctor if: You have itching or red, swollen areas of skin (hives). You feel worried or nervous (anxious). You feel weak after doing your normal activities. You have redness, swelling, warmth, or pain around the insertion site. You have blood coming from the insertion site, and the blood does not stop with pressure. You have pus or a bad smell coming from the insertion site. Get help right away if: You have signs of a serious reaction. This may be coming from an allergy or the body's defense system (immune system). Signs include: Trouble breathing or shortness of breath. Swelling of the face or feeling warm (flushed). Fever or chills. Head, chest, or back pain. Dark pee (urine) or blood in the pee. Widespread rash. Fast  heartbeat. Feeling dizzy or light-headed. You may receive your blood transfusion in an outpatient setting. If so, you will be told whom to contact to report any reactions. These symptoms may be an emergency. Do not wait to see if the symptoms will go away. Get medical help right away. Call your local emergency services (911 in the [place] Do not drive yourself to the hospital. Summary Bruising and soreness at the IV site are common. Check your insertion site every day for signs of infection. Rest as told by your doctor. Go back to your normal activities as told by your doctor. Get help right away if you have signs of a serious reaction. This information is not intended to replace advice given to you by your health care provider. Make sure you discuss any questions you have with your health care provider. Document Revised: 08/05/2020 Document Reviewed: 10/03/2018 Elsevier Patient Education  2023 Elsevier Inc.  

## 2021-09-01 NOTE — Progress Notes (Signed)
ON PATHWAY REGIMEN - Non-Small Cell Lung ? ?No Change  Continue With Treatment as Ordered. ? ?Original Decision Date/Time: 06/16/2021 08:40 ? ? ?  A cycle is every 21 days: ?    Pembrolizumab  ?    Pemetrexed  ?    Carboplatin  ? ?**Always confirm dose/schedule in your pharmacy ordering system** ? ?Patient Characteristics: ?Stage IV Metastatic, Nonsquamous, Molecular Analysis Completed, Molecular Alteration Present and Targeted Therapy Exhausted OR EGFR Exon 20+ or KRAS G12C+ or HER2+ Present and No Prior Chemo/Immunotherapy OR No Alteration Present, Initial  ?Chemotherapy/Immunotherapy, PS = 0, 1, BRAF/MET/KRAS/HER2 Mutation Positive, Candidate for Immunotherapy, PD-L1 Expression Positive 1-49% (TPS) / Negative / Not Tested / Awaiting Test Results and Immunotherapy Candidate ?Therapeutic Status: Stage IV Metastatic ?Histology: Nonsquamous Cell ?Broad Molecular Profiling Status: Molecular Analysis Completed ?Molecular Analysis Results: KRAS G12C Mutation Present and No Prior Chemo/Immunotherapy ?ECOG Performance Status: 1 ?Chemotherapy/Immunotherapy Line of Therapy: Initial Chemotherapy/Immunotherapy ?Immunotherapy Candidate Status: Candidate for Immunotherapy ?PD-L1 Expression Status: PD-L1 Positive 1-49% (TPS) ?Intent of Therapy: ?Non-Curative / Palliative Intent, Not Discussed with Patient ?

## 2021-09-01 NOTE — Progress Notes (Signed)
DISCONTINUE ON PATHWAY REGIMEN - Non-Small Cell Lung ? ? ?  A cycle is every 21 days: ?    Pembrolizumab  ?    Pemetrexed  ?    Carboplatin  ? ?**Always confirm dose/schedule in your pharmacy ordering system** ? ?REASON: Disease Progression ?PRIOR TREATMENT: LOS410: Pembrolizumab 200 mg + Pemetrexed 500 mg/m2 + Carboplatin AUC=5 q21 Days x 4 Cycles ?TREATMENT RESPONSE: Progressive Disease (PD) ? ?START OFF PATHWAY REGIMEN - Non-Small Cell Lung ? ? ?[id number]:Adagrasib 600 mg PO BID D1-28 q28 Days: ?  A cycle is every 28 days: ?    Adagrasib  ? ?**Always confirm dose/schedule in your pharmacy ordering system** ? ?Patient Characteristics: ?Stage IV Metastatic, Nonsquamous, Molecular Analysis Completed, Molecular Alteration Present and Eligible for Molecular Targeted Therapy, Second Line - Molecular Targeted Therapy, KRAS G12C Mutation Positive ?Therapeutic Status: Stage IV Metastatic ?Histology: Nonsquamous Cell ?Broad Molecular Profiling Status: Molecular Analysis Completed ?Molecular Analysis Results: Alteration Present and Eligible for Molecular Targeted Therapy ?Molecular Alteration Present: KRAS G12C Mutation Positive ?Molecular Targeted Line of Therapy: [facility] visitor Targeted Therapy ?Intent of Therapy: ?Non-Curative / Palliative Intent, Not Discussed with Patient ?

## 2021-09-01 NOTE — Telephone Encounter (Signed)
Oral Oncology Patient Advocate Encounter ? ?After completing a benefits investigation, prior authorization for [name] is not required at this time through [facility] D. ? ?Patient's copay is $0.   ? ?Wynn Maudlin CPHT ?Specialty Pharmacy Patient Advocate ?[place] ?Phone [phone] ?Fax [phone] ?09/01/2021 12:03 PM ? ? ?  ? ?

## 2021-09-02 ENCOUNTER — Encounter: Payer: Self-pay | Admitting: Family Medicine

## 2021-09-02 ENCOUNTER — Non-Acute Institutional Stay: Payer: Medicare Other | Admitting: Family Medicine

## 2021-09-02 ENCOUNTER — Other Ambulatory Visit (HOSPITAL_COMMUNITY): Payer: Self-pay

## 2021-09-02 ENCOUNTER — Telehealth: Payer: Self-pay | Admitting: Medical Oncology

## 2021-09-02 VITALS — BP 128/72 | HR 114 | Resp 20 | Wt 156.0 lb

## 2021-09-02 DIAGNOSIS — C3492 Malignant neoplasm of unspecified part of left bronchus or lung: Secondary | ICD-10-CM

## 2021-09-02 DIAGNOSIS — G893 Neoplasm related pain (acute) (chronic): Secondary | ICD-10-CM

## 2021-09-02 LAB — TYPE AND SCREEN
ABO/RH(D): AB POS
Antibody Screen: NEGATIVE
Unit division: 0
Unit division: 0

## 2021-09-02 LAB — BPAM RBC
Blood Product Expiration Date: 202305272359
Blood Product Expiration Date: 202305272359
ISSUE DATE / TIME: 202305111402
ISSUE DATE / TIME: 202305111402
Unit Type and Rh: 6200
Unit Type and Rh: 6200

## 2021-09-02 NOTE — Telephone Encounter (Signed)
LM at [facility] for nurse to return my call . ?Christopher Dawson will talk to pt about Fentanyl dose.  ?

## 2021-09-02 NOTE — Progress Notes (Signed)
? ? ?Manufacturing engineer ?Community Palliative Care Consult Note ?Telephone: [phone]  ?Fax: [phone]  ? ? ?Date of encounter: 09/02/21 ?12:35 PM ?PATIENT NAME: Christopher Dawson ?Christopher Dawson [id number]   ?[phone] (home)  ?DOB: 26-Sep-1955 ?MRN: [id number] ?PRIMARY CARE PROVIDER:    ?[name], MD,  ?[place] ?[place] [address] ?[phone] ? ?REFERRING PROVIDER:   ?[name], MD ?[address] ?[place],  [place] [address] ?[phone] ? ?RESPONSIBLE PARTY:    ?Contact Information   ? ? Name Relation Home Work Mobile  ? Christopher Dawson Mother [phone]    ? Christopher Dawson Sister   [phone]  ? ?  ? ? ? ?I met face to face with patient in [name] facility. Palliative Care was asked to follow this patient by consultation request of  [name],* to address advance care planning and complex medical decision making. This is a follow up visit. ? ?                                 ASSESSMENT, SYMPTOM MANAGEMENT AND PLAN / RECOMMENDATIONS:  ? ? Cancer related pain ?Continue fentanyl with possible dose adjustment to 12.5 mcg/hr, change Q 3 days.   ?Continue Oxy IR scheduled Q 4 hrs for breakthrough pain. ?Continue Gabapentin to 100 mg TID to help with neuropathic component of pain. ? ?2.   Non Small Cell Lung Cancer of left lung stage 4 ?Notified by [facility] yesterday regarding progression of cancer on latest CT scans and change to oral chemo agent that may interact with Fentanyl and potentiate the dose of Fentanyl. ?Pt resistant to change, sent message through Epic for cancer center provider to discuss and will follow further recommendations. ? ? ?Advance Care Planning/Goals of Care: Goals include to maximize quality of life and symptom management.  ?CODE STATUS: ?Limited full code without intubation per Oncology NP ? ? ? ?Follow up Palliative Care Visit: Palliative care will continue to follow for complex  medical decision making, advance care planning, and clarification of goals. Return 4 weeks or prn. ? ? ? ?This visit was coded based on medical decision making (MDM). ? ?PPS: 30% ? ?HOSPICE ELIGIBILITY/DIAGNOSIS: TBD ? ?Chief Complaint:  ? Palliative Care is following patient with metastatic lung cancer to bone for pain management and to help with idenitfying/refining goals of care.  ? ?HISTORY OF PRESENT ILLNESS:  Christopher Dawson is a 66 y.o. year old male  with stage IV non-small cell lung cancer with metastasis to bone and hilar adenopathy s/p Palliative Radiation and current palliative chemotherapy Q 3 weeks under orders from Dr Earlie Server.  He also has a hx of Marfan's syndrome,  COPD, chronic lumbar radiculopathy, thrombocytopenia, depression, hx of ETOH abuse, hypocalcemia, HLD, hyponatremia and constipation.  Pt rates his pain a 7 on a 10 scale with "aching pain" in BLE.  Reviewed recommendations from [place] of the need to decrease Fentanyl dose due to interaction with new oral chemo agent being tried as latest scans show increase in size of lung mass.  Advised pt that new chemo agent may increase the fentanyl effects.  He does not want to decrease his fentanyl dose nor increase the OxyCodone even temporarily.  He states improvement in back pain with adjustment of Gabapentin dose the last time to 300 mg daily. Denies falls.  He states current weight at facility is 156 lbs and  he is receiving a supplement but prefers Ensure or Boost that come in other flavors besides Vanilla. States he was told that he could go home on the oral chemo agent and he plans to check on ordering Ensure or Boost. ? ?History obtained from review of EMR, discussion with Facility staff and/or Christopher Dawson.  ?I reviewed available labs, medications, imaging, studies and related documents from the EMR.  Records reviewed and summarized above.  ? ?ROS ?General: NAD ?EYES: denies vision changes ?ENMT: denies  dysphagia ?Cardiovascular: denies chest pain, denies DOE ?Pulmonary: denies cough, denies increased SOB ?Abdomen: endorses Fair appetite and some constipation but didn't take his laxative yesterday due to MD appt, endorses continence of bowel ?GU: denies dysuria, has foley catheter ?MSK:  endorses generalized weakness,  no falls reported ?Skin: denies rashes or wounds ?Neurological: Reports difficulty sleeping with noise at facility ?Psych: endorses no anxiety currently ?Heme/lymph/immuno: denies bruises, abnormal bleeding ? ?Physical Exam: ?Current and past weights: last weight 156 lbs  ?Constitutional: NAD ?General: frail, cachectic male ?EYES: anicteric sclera, lids intact, no discharge  ?ENMT: intact hearing, oral mucous membranes moist, dentition intact ?CV: S1S2, tachycardic regular rhythm, no LE edema ?Pulmonary: CTAB, no increased work of breathing, no cough, room air ?Abdomen: normo-active BS + 4 quadrants, soft and non tender, no ascites ?GU: deferred ?MSK: noted sarcopenia of all 4 limbs, moves all extremities, noted muscular atrophy of BLE ?Skin: warm and dry, no rashes or wounds on visible skin ?Neuro:  noted generalized weakness,  no cognitive impairment ?Psych: blunted affect, A and O x 3 ?Hem/lymph/immuno: no widespread bruising ? ? ?Thank you for the opportunity to participate in the care of Christopher Dawson.  The palliative care team will continue to follow. Please call our office at [phone] if we can be of additional assistance.  ? ?Marijo Conception, FNP -C ? ?COVID-19 PATIENT SCREENING TOOL ?Asked and negative response unless otherwise noted:  ? ?Have you had symptoms of covid, tested positive or been in contact with someone with symptoms/positive test in the past 5-10 days?  Unknown ?

## 2021-09-02 NOTE — Telephone Encounter (Addendum)
Oral Chemotherapy Pharmacist Encounter  ? ?Attempted to call main phone number at [name] to coordinate shipment of Krazati to SNF. No answer at main phone number [phone]). Will attempt calling later this afternoon. ? ?ADDENDUM 09/02/2021 ?Spoke with patient's nurse, Marrion Coy [phone]). [facility] will be shipping the [place] to [place] at the [place] of the Director of Nursing, [employer], on 09/05/21 for delivery 09/06/21. I will call [place] back on 09/05/21 to discuss initial counseling with her and Mr. Carstens for [place].  ? ?Leron Croak, PharmD, BCPS ?Hematology/Oncology Clinical Pharmacist ?[facility] Oral Chemotherapy Navigation Clinics ?[phone] ?09/02/2021 9:15 AM ? ? ? ? ?

## 2021-09-05 ENCOUNTER — Telehealth: Payer: Self-pay | Admitting: Medical Oncology

## 2021-09-05 ENCOUNTER — Other Ambulatory Visit (HOSPITAL_COMMUNITY): Payer: Self-pay

## 2021-09-05 MED ORDER — KRAZATI 200 MG PO TABS
600.0000 mg | ORAL_TABLET | Freq: Two times a day (BID) | ORAL | 3 refills | Status: DC
  Filled 2021-09-05: qty 180, 30d supply, fill #0
  Filled 2021-09-26: qty 180, 30d supply, fill #1
  Filled 2021-10-26: qty 180, 30d supply, fill #2
  Filled 2021-11-22: qty 180, 30d supply, fill #3

## 2021-09-05 NOTE — Telephone Encounter (Signed)
Oral Chemotherapy Pharmacist Encounter ?  ?I spoke with patient as well as patient's nurse, Marrion Coy, for overview of new oral chemotherapy medication: Christopher Dawson (adagrasib) for the treatment of metastatic non-small cell lung cancer, KRAS G12C-mutated, planned duration until disease progression or unacceptable drug toxicity. ?  ?Pt is doing well. Counseled patient on administration, dosing, side effects, monitoring, drug-food interactions, safe handling, storage, and disposal. ?  ?Patient will take Krazati 200 mg tablets, 3 tablets (600 mg total) by mouth 2 (two) times daily. Recommended patient take Christopher Dawson with food to decrease GI upset. ?  ?Start date: 09/07/21 ?  ?Side effects include but are not limited to: diarrhea, nausea/vomiting, hepatotoxicity, edema, fatigue, decrease in blood counts. Also reviewed rare but serious side effect of Qtc prolongation as well as pneumonitis that have been reported with use of medication.  ? ?Patient has anti-emetic on hand and knows to take it if nausea develops. Discussed avoiding use of ondansetron (Zofran) as this has risk for Qtc prolongation. If patient develops nausea, recommended considering prochlorperazine 30-60 minutes prior to Krazati dose to help with N/V PPX.    ?Patient has anti diarrheal and will alert the office of 4 or more loose stools above baseline.  ?  ?Due to concerns of oversedation with fentanyl and Krazati concomitant therapy, patient's fentanyl patch has been dosed reduced to the 12.5 mcg dose effective as of 09/05/21. Dose may be adjusted after Christopher Dawson has reached steady state (typically takes ~5 days). Patient and nurse aware to monitor closely for over sedation.  ? ?Reviewed with patient importance of keeping a medication schedule and plan for any missed doses. ? ?After discussion with patient no patient barriers to medication adherence identified.  ?  ?Ship broker for Christopher Dawson has been obtained. ?Test claim at the pharmacy revealed  copayment $0 for 1st fill of Krazati. ?This will ship from the [facility] on 5/15/ to deliver to SNF on 09/06/21. ? ?Patient informed the pharmacy will reach out 5-7 days prior to needing next fill of Krazati to coordinate continued medication acquisition to prevent break in therapy. ? ?All questions answered. ? ?Christopher Dawson voiced understanding and appreciation.  ? ?Medication education handout placed in mail for patient. Patient knows to call the office with questions or concerns. Oral Chemotherapy Clinic phone number provided to patient. ? ?Leron Croak, PharmD, BCPS ?Hematology/Oncology Clinical Pharmacist ?[facility] ?[phone] ?09/05/2021 11:17 AM ? ?

## 2021-09-05 NOTE — Telephone Encounter (Addendum)
Fentanyl interaction with Christopher Dawson- I cannot reach pt on his phone to tell him the fentanyl must be dose reduced because of potential interaction with Christopher Dawson or he can try a different  pain medication.   I sent [name] a message to talk to [name] about this .

## 2021-09-13 ENCOUNTER — Telehealth: Payer: Self-pay | Admitting: Pharmacist

## 2021-09-13 ENCOUNTER — Telehealth: Payer: Self-pay | Admitting: Medical Oncology

## 2021-09-13 NOTE — Telephone Encounter (Signed)
Oral Chemotherapy Pharmacist Encounter   Received transferred call from patient regarding questions on Krazati dosing. Confirmed with patient that he is to be taking 3 tablets (600 mg total) by mouth in the morning and at bedtime. Patient expressed understanding. No other questions or concerns at this time.   This information had been previously shared with patient's rehab facility as well.   Leron Croak, PharmD, BCPS Hematology/Oncology Clinical Pharmacist [facility] [phone] 09/13/2021 3:58 PM\

## 2021-09-13 NOTE — Telephone Encounter (Signed)
Pt concerned his Krazati dose was reduced. I transferred call to Leron Croak.

## 2021-09-14 ENCOUNTER — Telehealth: Payer: Self-pay | Admitting: Internal Medicine

## 2021-09-14 NOTE — Telephone Encounter (Signed)
Called to remind patient regarding upcoming appointment, no voicemail set up.

## 2021-09-15 ENCOUNTER — Telehealth: Payer: Self-pay | Admitting: *Deleted

## 2021-09-15 ENCOUNTER — Inpatient Hospital Stay: Payer: Medicare Other | Admitting: Internal Medicine

## 2021-09-15 ENCOUNTER — Inpatient Hospital Stay: Payer: Medicare Other

## 2021-09-15 NOTE — Telephone Encounter (Signed)
Missed appt today for labs and to see Dr. Julien Nordmann. Contacted patient x 2 - no answer on cell# [phone]  listed as primary contact  Reading further in chart, found notation that patient is at [facility] [phone]  Sent schedule message to contact patient and [name] to r/s appts

## 2021-09-16 ENCOUNTER — Telehealth: Payer: Self-pay | Admitting: Internal Medicine

## 2021-09-16 NOTE — Telephone Encounter (Signed)
Called per 5/25 inbasket, pt nursing home is aware

## 2021-09-20 ENCOUNTER — Encounter: Payer: Self-pay | Admitting: Family Medicine

## 2021-09-20 ENCOUNTER — Non-Acute Institutional Stay: Payer: Medicare Other | Admitting: Family Medicine

## 2021-09-20 ENCOUNTER — Other Ambulatory Visit (HOSPITAL_COMMUNITY): Payer: Self-pay

## 2021-09-20 VITALS — BP 110/60 | HR 106 | Resp 18

## 2021-09-20 DIAGNOSIS — G893 Neoplasm related pain (acute) (chronic): Secondary | ICD-10-CM

## 2021-09-20 DIAGNOSIS — C3492 Malignant neoplasm of unspecified part of left bronchus or lung: Secondary | ICD-10-CM

## 2021-09-20 NOTE — Progress Notes (Signed)
[facility] Palliative Care Consult Note Telephone: [phone]  Fax: [phone]    Date of encounter: 09/20/21 12:35 PM PATIENT NAME: Christopher Dawson [address] [place] [address]   [phone] (home)  DOB: 01-05-1956 MRN: [id number] PRIMARY CARE PROVIDER:    [name], MD,  [place] [address] [phone]  REFERRING PROVIDER:   [name], MD [place],  [place] [address] [phone]  RESPONSIBLE PARTY:    Contact Information     Name Relation Home Work [place] Mother [phone]     Rennis Harding   [phone]        I met face to face with patient in [name] facility. Palliative Care was asked to follow this patient by consultation request of  [name],* to address advance care planning and complex medical decision making. This is a follow up visit.                                   ASSESSMENT, SYMPTOM MANAGEMENT AND PLAN / RECOMMENDATIONS:    Cancer related pain Continue fentanyl 12.5 mcg/hr, change Q 3 days.   Continue Oxy IR scheduled Q 4 hrs for breakthrough pain. Continue Gabapentin to 100 mg TID to help with neuropathic component of pain.  2.   Non Small Cell Lung Cancer of left lung stage 4 Continues oral chemotherapy with reduced dose fentanyl. Continue nutritional supplements TID and increase protein intake.   Advance Care Planning/Goals of Care: Goals include to maximize quality of life and symptom management.  CODE STATUS: Limited full code without intubation per Oncology NP    Follow up Palliative Care Visit: Palliative care will continue to follow for complex medical decision making, advance care planning, and clarification of goals. Return 4 weeks or prn.    This visit was coded based on medical decision making (MDM).  PPS: 40%  HOSPICE ELIGIBILITY/DIAGNOSIS: TBD  Chief Complaint:    Palliative Care is following patient to help with chronic management of metastatic lung cancer.   HISTORY OF PRESENT ILLNESS:  Christopher Dawson is a 66 y.o. 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  Currently he is on new oral chemotherapy agent started per Dr Earlie Server.  He also has a hx of Marfan's syndrome,  COPD, chronic lumbar radiculopathy, thrombocytopenia, depression, hx of ETOH abuse, hypocalcemia, HLD, hyponatremia and constipation.  Pt rates his pain an 8 on a 10 scale with "burning pain" in bilateral hips at site of pressure ulcers and low back pain.  He states nursing has told him these pressure ulcers are showing some healing.  Fentanyl dose was halved due to interaction with new oral chemo agent after latest scans showed  increase in size of lung mass.   Denies falls.  He states improvement in appetite and constipation, now having approximately 1-2 Bms daily without need to strain.  He states "I feel better since starting the new chemo."  Has completed recent antibiotic course with Doxycycline for CAP with significant decrease in cough.  Denies CP, SOB, nausea or vomiting. Nursing states no significant changes. Endorses intermittent hoarseness but denies indigestion or postnasal drip. Is on Protonix daily.  History obtained from review of EMR, discussion with Facility staff and/or Mr. Iannello.  I reviewed available labs, medications, imaging, studies  and related documents from the EMR.  Records reviewed and summarized above.   ROS General: NAD EYES: denies vision changes ENMT: denies dysphagia, c/o hoarseness intermittently Cardiovascular: denies chest pain, denies DOE Pulmonary: denies cough, denies increased SOB Abdomen: endorses good appetite, denies constipation, endorses continence of bowel GU: denies dysuria, has foley catheter MSK:  endorses generalized weakness,  no falls reported Skin: denies  rashes or wounds Neurological: Reports difficulty sleeping with noise at facility Psych: endorses no anxiety currently, improved  mood Heme/lymph/immuno: denies bruises, abnormal bleeding  Physical Exam: Current and past weights: last weight 156 lbs  Constitutional: NAD General: frail, cachectic male EYES: anicteric sclera, lids intact, no discharge  ENMT: intact hearing, oral mucous membranes moist, dentition in varying stages of repair CV: S1S2, tachycardic regular rhythm, no LE edema Pulmonary: CTAB, no increased work of breathing, no cough, room air Abdomen: hypo-active BS + 4 quadrants, soft and non tender, no ascites GU: Foley cath to gravity drainage, draining clear yellow urine MSK: noted sarcopenia of all 4 limbs, moves all extremities, noted muscular atrophy of BLE Skin: warm and dry, no rashes.  Bilat hip pressure ulcers covered with CDI dressings Neuro:  noted generalized weakness,  no cognitive impairment Psych:  no anxiety, A and O x 3 Hem/lymph/immuno: no widespread bruising   Thank you for the opportunity to participate in the care of Mr. Gladu.  The palliative care team will continue to follow. Please call our office at [phone] if we can be of additional assistance.   Marijo Conception, FNP -C  COVID-19 PATIENT SCREENING TOOL Asked and negative response unless otherwise noted:   Have you had symptoms of covid, tested positive or been in contact with someone with symptoms/positive test in the past 5-10 days?  Unknown

## 2021-09-22 ENCOUNTER — Ambulatory Visit: Payer: Medicare Other | Admitting: Internal Medicine

## 2021-09-22 ENCOUNTER — Other Ambulatory Visit: Payer: Medicare Other

## 2021-09-22 ENCOUNTER — Ambulatory Visit: Payer: Self-pay

## 2021-09-26 ENCOUNTER — Other Ambulatory Visit (HOSPITAL_COMMUNITY): Payer: Self-pay

## 2021-09-28 ENCOUNTER — Other Ambulatory Visit (HOSPITAL_COMMUNITY): Payer: Self-pay

## 2021-10-03 ENCOUNTER — Other Ambulatory Visit (HOSPITAL_COMMUNITY): Payer: Self-pay

## 2021-10-05 ENCOUNTER — Telehealth: Payer: Self-pay | Admitting: Medical Oncology

## 2021-10-05 NOTE — Telephone Encounter (Signed)
Appts given to [facility] . Schedule message sent.

## 2021-10-06 NOTE — Telephone Encounter (Signed)
I confirmed pts new appt times with Suanne Marker.

## 2021-10-07 ENCOUNTER — Telehealth: Payer: Self-pay | Admitting: Internal Medicine

## 2021-10-07 NOTE — Telephone Encounter (Signed)
.  Called patient to schedule appointment per 6/15 inbasket, patient is aware of date and time.

## 2021-10-11 ENCOUNTER — Other Ambulatory Visit: Payer: Medicare Other

## 2021-10-11 ENCOUNTER — Ambulatory Visit: Payer: Medicare Other | Admitting: Internal Medicine

## 2021-10-11 ENCOUNTER — Encounter: Payer: Self-pay | Admitting: *Deleted

## 2021-10-11 NOTE — Progress Notes (Signed)
I followed up on Christopher Dawson schedule for follow up. He is set up to see Dr. Julien Nordmann next week.

## 2021-10-14 ENCOUNTER — Telehealth: Payer: Self-pay | Admitting: Medical Oncology

## 2021-10-19 ENCOUNTER — Inpatient Hospital Stay: Payer: Medicare Other | Attending: Physician Assistant

## 2021-10-19 ENCOUNTER — Other Ambulatory Visit (HOSPITAL_COMMUNITY): Payer: Self-pay

## 2021-10-19 ENCOUNTER — Inpatient Hospital Stay (HOSPITAL_BASED_OUTPATIENT_CLINIC_OR_DEPARTMENT_OTHER): Payer: Medicare Other | Admitting: Internal Medicine

## 2021-10-19 ENCOUNTER — Other Ambulatory Visit: Payer: Self-pay

## 2021-10-19 VITALS — BP 112/60 | HR 101 | Resp 18 | Ht 76.0 in | Wt 158.0 lb

## 2021-10-19 DIAGNOSIS — Z79899 Other long term (current) drug therapy: Secondary | ICD-10-CM | POA: Insufficient documentation

## 2021-10-19 DIAGNOSIS — Z923 Personal history of irradiation: Secondary | ICD-10-CM | POA: Insufficient documentation

## 2021-10-19 DIAGNOSIS — C7951 Secondary malignant neoplasm of bone: Secondary | ICD-10-CM | POA: Diagnosis not present

## 2021-10-19 DIAGNOSIS — D6481 Anemia due to antineoplastic chemotherapy: Secondary | ICD-10-CM | POA: Insufficient documentation

## 2021-10-19 DIAGNOSIS — C3490 Malignant neoplasm of unspecified part of unspecified bronchus or lung: Secondary | ICD-10-CM

## 2021-10-19 DIAGNOSIS — C3412 Malignant neoplasm of upper lobe, left bronchus or lung: Secondary | ICD-10-CM | POA: Diagnosis present

## 2021-10-19 DIAGNOSIS — T451X5A Adverse effect of antineoplastic and immunosuppressive drugs, initial encounter: Secondary | ICD-10-CM | POA: Diagnosis not present

## 2021-10-19 DIAGNOSIS — C3492 Malignant neoplasm of unspecified part of left bronchus or lung: Secondary | ICD-10-CM

## 2021-10-19 DIAGNOSIS — R197 Diarrhea, unspecified: Secondary | ICD-10-CM | POA: Diagnosis not present

## 2021-10-19 LAB — CMP (CANCER CENTER ONLY)
ALT: 22 U/L (ref 0–44)
AST: 33 U/L (ref 15–41)
Albumin: 2.7 g/dL — ABNORMAL LOW (ref 3.5–5.0)
Alkaline Phosphatase: 79 U/L (ref 38–126)
Anion gap: 5 (ref 5–15)
BUN: 14 mg/dL (ref 8–23)
CO2: 25 mmol/L (ref 22–32)
Calcium: 8.2 mg/dL — ABNORMAL LOW (ref 8.9–10.3)
Chloride: 105 mmol/L (ref 98–111)
Creatinine: 0.53 mg/dL — ABNORMAL LOW (ref 0.61–1.24)
GFR, Estimated: >60 mL/min (ref >60)
Glucose, Bld: 94 mg/dL (ref 70–99)
Potassium: 4 mmol/L (ref 3.5–5.1)
Sodium: 135 mmol/L (ref 135–145)
Total Bilirubin: 0.5 mg/dL (ref 0.3–1.2)
Total Protein: 5 g/dL — ABNORMAL LOW (ref 6.5–8.1)

## 2021-10-19 LAB — CBC WITH DIFFERENTIAL (CANCER CENTER ONLY)
Abs Immature Granulocytes: 0.1 10*3/uL — ABNORMAL HIGH (ref 0.00–0.07)
Basophils Absolute: 0 10*3/uL (ref 0.0–0.1)
Basophils Relative: 0 %
Eosinophils Absolute: 0.3 10*3/uL (ref 0.0–0.5)
Eosinophils Relative: 4 %
HCT: 27.6 % — ABNORMAL LOW (ref 39.0–52.0)
Hemoglobin: 8.8 g/dL — ABNORMAL LOW (ref 13.0–17.0)
Immature Granulocytes: 1 %
Lymphocytes Relative: 12 %
Lymphs Abs: 0.8 10*3/uL (ref 0.7–4.0)
MCH: 27.7 pg (ref 26.0–34.0)
MCHC: 31.9 g/dL (ref 30.0–36.0)
MCV: 86.8 fL (ref 80.0–100.0)
Monocytes Absolute: 1 10*3/uL (ref 0.1–1.0)
Monocytes Relative: 15 %
Neutro Abs: 4.7 10*3/uL (ref 1.7–7.7)
Neutrophils Relative %: 68 %
Platelet Count: 278 10*3/uL (ref 150–400)
RBC: 3.18 MIL/uL — ABNORMAL LOW (ref 4.22–5.81)
RDW: 17.5 % — ABNORMAL HIGH (ref 11.5–15.5)
WBC Count: 7 10*3/uL (ref 4.0–10.5)
nRBC: 0 % (ref 0.0–0.2)

## 2021-10-19 NOTE — Progress Notes (Signed)
Christopher Dawson [phone]   Fax:([phone]  OFFICE PROGRESS NOTE  Christopher Maw, MD [place] [address]  DIAGNOSIS: Stage IV (T1c, N1, M1 C) non-small cell lung cancer, adenosquamous carcinoma presented with left upper lobe lung nodule in addition to left hilar adenopathy and bone metastasis diagnosed January 2023.  Molecular studies by foundation 1 showed positive KRAS G12C mutation  PD-L1 expression 10%  PRIOR THERAPY:  1) Palliative radiotherapy to the T12 destructive pathologic fracture under the care of Dr. Tammi Dawson completed on May 10, 2021. 2) Systemic chemotherapy with carboplatin for AUC of 5, Alimta 500 Mg/M2 and Keytruda 200 Mg IV every 3 weeks. Last dose on 08/08/21. Status post 3 cycles. Discontinued due to disease progression.    CURRENT THERAPY:  Krazati p.o. daily.  First dose Sep 07, 2021.  Status post 5 weeks of treatment  INTERVAL HISTORY: Christopher Dawson 66 y.o. male returns to the clinic today for follow-up visit by EMS on stretcher because of the persistent weakness of the lower extremities after the destructive lesion at the T12.  The patient is feeling fine today with no concerning complaints except for itching.  He does not use any medication for it.  He also had few episodes of diarrhea that resolved spontaneously.  He did not have to take any Imodium.  He has no current nausea, vomiting, abdominal pain or constipation.  He has no chest pain but has shortness of breath occasionally with no cough or hemoptysis.  He has no fever or chills.  He denied having any recent weight loss or night sweats.  He has been tolerating his treatment with Christopher Dawson (Adagrasib) fairly well.  He is here for evaluation and repeat blood work.  MEDICAL HISTORY: Past Medical History:  Diagnosis Date   Anxiety    Depression    Lumbar radiculopathy, chronic 2008   Marfan syndrome    Neurosis, depressive    Shortening,  leg, congenital     ALLERGIES:  is allergic to codeine.  MEDICATIONS:  Current Outpatient Medications  Medication Sig Dispense Refill   busPIRone (BUSPAR) 7.5 MG tablet Take 7.5 mg by mouth daily.     folic acid (FOLVITE) 1 MG tablet Take 1 tablet (1 mg total) by mouth daily. 30 tablet 4   gabapentin (NEURONTIN) 100 MG capsule Take 1 capsule (100 mg total) by mouth 3 (three) times daily. 90 capsule 0   acetaminophen (TYLENOL) 325 MG tablet Take 2 tablets (650 mg total) by mouth every 6 (six) hours as needed for mild pain (or Fever >/= 101). 30 tablet 0   Adagrasib (KRAZATI) 200 MG TABS Take 600 mg by mouth in the morning and at bedtime. 180 tablet 3   bisacodyl (DULCOLAX) 5 MG EC tablet Take 1 tablet (5 mg total) by mouth daily. 30 tablet 0   clomiPRAMINE (ANAFRANIL) 50 MG capsule Take 50 mg by mouth at bedtime.     doxycycline (VIBRA-TABS) 100 MG tablet Take 1 tablet (100 mg total) by mouth 2 (two) times daily. 14 tablet 0   feeding supplement (ENSURE ENLIVE / ENSURE PLUS) LIQD Take 237 mLs by mouth 3 (three) times daily between meals. 237 mL 12   fentaNYL (DURAGESIC) 25 MCG/HR Place 1 patch onto the skin every 3 (three) days.     hydrOXYzine (ATARAX) 25 MG tablet Take 1 tablet (25 mg total) by mouth 3 (three) times daily as needed for anxiety. 30 tablet 0  lactulose (CHRONULAC) 10 GM/15ML solution Take 45 mLs (30 g total) by mouth 2 (two) times daily. 236 mL 0   Multiple Vitamin (MULTIVITAMIN WITH MINERALS) TABS tablet Take 1 tablet by mouth daily. 30 tablet 0   Multiple Vitamins-Minerals (DECUBI-VITE) CAPS Take by mouth.     nicotine (NICODERM CQ - DOSED IN MG/24 HOURS) 14 mg/24hr patch Place 1 patch (14 mg total) onto the skin daily. 28 patch 0   ondansetron (ZOFRAN) 4 MG tablet Take 1 tablet (4 mg total) by mouth every 6 (six) hours as needed for nausea. 20 tablet 0   oxyCODONE (OXY IR/ROXICODONE) 5 MG immediate release tablet Take 1-2 tablets (5-10 mg total) by mouth every 4 (four)  hours as needed for moderate pain. 10 tablet 0   pantoprazole (PROTONIX) 40 MG tablet Take 1 tablet (40 mg total) by mouth daily. 30 tablet 0   prochlorperazine (COMPAZINE) 10 MG tablet Take 1 tablet (10 mg total) by mouth every 6 (six) hours as needed for nausea or vomiting. 30 tablet 0   senna-docusate (SENOKOT-S) 8.6-50 MG tablet Take 1 tablet by mouth 2 (two) times daily. 30 tablet 0   tamsulosin (FLOMAX) 0.4 MG CAPS capsule Take 1 capsule (0.4 mg total) by mouth daily after supper. 30 capsule 0   thiamine 100 MG tablet Take 1 tablet (100 mg total) by mouth daily. 30 tablet 0   No current facility-administered medications for this visit.    SURGICAL HISTORY:  Past Surgical History:  Procedure Laterality Date   LEG SURGERY      REVIEW OF SYSTEMS:  Constitutional: positive for fatigue Eyes: negative Ears, nose, mouth, throat, and face: negative Respiratory: positive for dyspnea on exertion Cardiovascular: negative Gastrointestinal: negative Genitourinary:negative Integument/breast: negative Hematologic/lymphatic: negative Musculoskeletal:positive for muscle weakness Neurological: positive for weakness Behavioral/Psych: negative Endocrine: negative Allergic/Immunologic: negative   PHYSICAL EXAMINATION: General appearance: alert, cooperative, fatigued, and no distress Head: Normocephalic, without obvious abnormality, atraumatic Neck: no adenopathy, no JVD, supple, symmetrical, trachea midline, and thyroid not enlarged, symmetric, no tenderness/mass/nodules Lymph nodes: Cervical, supraclavicular, and axillary nodes normal. Resp: clear to auscultation bilaterally Back: symmetric, no curvature. ROM normal. No CVA tenderness. Cardio: regular rate and rhythm, S1, S2 normal, no murmur, click, rub or gallop GI: soft, non-tender; bowel sounds normal; no masses,  no organomegaly Extremities: extremities normal, atraumatic, no cyanosis or edema Neurologic: Alert and oriented X 3, normal  strength and tone. Normal symmetric reflexes. Normal coordination and gait  ECOG PERFORMANCE STATUS: 1 - Symptomatic but completely ambulatory  Blood pressure 112/60, pulse (!) 101, resp. rate 18, height _0  (1.93 m), weight 158 lb (71.7 kg), SpO2 93 %.  LABORATORY DATA: Lab Results  Component Value Date   WBC 7.0 10/19/2021   HGB 8.8 (L) 10/19/2021   HCT 27.6 (L) 10/19/2021   MCV 86.8 10/19/2021   PLT 278 10/19/2021      Chemistry      Component Value Date/Time   NA 135 09/01/2021 1043   NA 146 (H) 07/04/2018 1642   K 3.5 09/01/2021 1043   CL 102 09/01/2021 1043   CO2 26 09/01/2021 1043   BUN 22 09/01/2021 1043   BUN 13 07/04/2018 1642   CREATININE 0.45 (L) 09/01/2021 1043   CREATININE 0.95 07/04/2012 1534      Component Value Date/Time   CALCIUM 9.1 09/01/2021 1043   ALKPHOS 55 09/01/2021 1043   AST 11 (L) 09/01/2021 1043   ALT 11 09/01/2021 1043   BILITOT 0.3 09/01/2021 1043  RADIOGRAPHIC STUDIES: No results found.  ASSESSMENT AND PLAN: This is a very pleasant 66 years old white male with a stage IV (T1c, M1, M1 C) non-small cell lung cancer, adenosquamous carcinoma presented with left upper lobe lung nodule in addition to left hilar adenopathy and bone metastasis diagnosed in January 2023 with positive KRAS G12C mutation and PD-L1 expression of 10%. He underwent systemic chemotherapy with carboplatin for AUC of 5, Alimta 500 Mg/M2 and Keytruda 200 Mg IV every 3 weeks.  First cycle June 30, 2021.  Status post 3 cycles.  This treatment was discontinued secondary to disease progression. The patient has started second line treatment with Christopher Dawson (Adagrasib) 600 Mg p.o. twice daily on Sep 07, 2021.  He has been tolerating this treatment well with no concerning adverse effect except for mild itching and few episodes of diarrhea that completely resolved. He generally feels much better taking the medication. Repeat blood work today showed no concerning findings except  for the baseline anemia that slightly improved compared to several weeks ago.  He has no significant liver dysfunction. I recommended for the patient to continue his current treatment with Christopher Dawson (Adagrasib) with the same dose. I will see him back for follow-up visit in 4 weeks for evaluation with repeat CT scan of the chest, abdomen and pelvis for restaging of his disease. For the diarrhea, I advised the patient to use Imodium on as-needed basis. For the itching, I will give him prescription for hydrocortisone 1% cream to be applied to the affected area on as-needed basis. He was referred to hospice by his primary care physician but the patient declined this option because he would like to continue with the treatment. The patient was advised to call immediately if he has any other concerning symptoms in the interval.  The patient voices understanding of current disease status and treatment options and is in agreement with the current care plan.  All questions were answered. The patient knows to call the clinic with any problems, questions or concerns. We can certainly see the patient much sooner if necessary.  The total time spent in the appointment was 35 minutes.  Disclaimer: This note was dictated with voice recognition software. Similar sounding words can inadvertently be transcribed and may not be corrected upon review.

## 2021-10-19 NOTE — Progress Notes (Signed)
Long term indwelling urinary catheter in place.

## 2021-10-21 ENCOUNTER — Non-Acute Institutional Stay: Payer: Medicare Other | Admitting: Family Medicine

## 2021-10-21 DIAGNOSIS — G893 Neoplasm related pain (acute) (chronic): Secondary | ICD-10-CM

## 2021-10-21 NOTE — Progress Notes (Unsigned)
10/21/2021 3:30 pm A/P Cancer related pain Likely pain in legs from degeneration of disc and spinal impingement Per Dr Earlie Server requesting no increase in fentanyl dose due to potentiation of sedation with chemo. Will follow up with facility to check how often pt receiving OxyCodone which is ordered 5-10 mg q 4 hrs prn. May consider adding extended release OxyCodone.  S:  Palliative Care provider stopped by Christopher Dawson who requested adjustment in pain medication, stating pain staying at a 7-8/10 scale with pain in bilat LE.  He had medication adjusted because his new chemotherapy agent recently started had the potential to increase his fentanyl concentrations for increased sedation.  His dose had been halved to 12 mcg/hr and he had additional opiod oral medication prescribed for pain management.  Advised pt that this provider would follow up with the prescribing provider for current pain management regimen and with Oncology team to determine appropriate pain management regimen with current chemotherapy.  Pt insistent that he is able to manage on his own at home despite being unable to ambulate and having significant difficulty with transferring from bed to chair and back.  He currently has a foley cath in place and has no support structure at home.  He continues to state that chemotherapy is effective and he may have "years" left despite significant growth of his tumor on chemo and s/p radiation.   States he would order groceries to be delivered to home, hire someone at about $9/hr to help a couple days per week "because I don't need more than that."   O: Seen lying in bed General:  NAD, cachectic with significant muscle atrophy and subcu fat loss centrally and in all 4 extremities CV:  No apparent cyanosis or edema Resp:  No dyspnea, audible wheezing   Christopher Hippo FNP-C [place]

## 2021-10-22 ENCOUNTER — Encounter: Payer: Self-pay | Admitting: Family Medicine

## 2021-10-24 ENCOUNTER — Telehealth: Payer: Self-pay | Admitting: Family Medicine

## 2021-10-24 NOTE — Telephone Encounter (Signed)
TCT Suanne Marker, nurse at [place] [facility] caring for patient today.  She states pt is currently on Fentanyl 12 mcg/hr patch, change Q 72 hours and Oxycodone IR 5 mg Q 6 hrs.  Will change Oxy IR to 5 mg Q 6 hrs prn for breakthrough pain and add OxyCodone ER 12 hr 15 mg tab BID.  Dose increase of 20 mg OxyCodone per day if he takes full dosing.  Sent prescription to [facility] and called in changes to [place].  She asked if the patient was not going on Hospice and advised that the patient is requesting full code and does not want to stop chemotherapy at the present time.  Explained that Dr Earlie Server did not want Fentanyl dose adjusted given current chemo.  Damaris Hippo FNP-C

## 2021-10-26 ENCOUNTER — Other Ambulatory Visit (HOSPITAL_COMMUNITY): Payer: Self-pay

## 2021-11-01 ENCOUNTER — Other Ambulatory Visit (HOSPITAL_COMMUNITY): Payer: Self-pay

## 2021-11-14 ENCOUNTER — Telehealth: Payer: Self-pay

## 2021-11-14 NOTE — Telephone Encounter (Signed)
1 pm. Message received earlier today from patient but it was difficult to understand.  Return call made but no answer and VM is not set up.  Incoming call at 3 pm from patient.  He would like to speak with Damaris Hippo, NP regarding medications.  Message sent to NP for follow up.

## 2021-11-14 NOTE — Progress Notes (Unsigned)
[facility] OFFICE PROGRESS NOTE  Mliss Sax, MD [address] [place] [address]  DIAGNOSIS: Stage IV (T1c, N1, M1 C) non-small cell lung cancer, adenosquamous carcinoma presented with left upper lobe lung nodule in addition to left hilar adenopathy and bone metastasis diagnosed January 2023.   Molecular studies by foundation 1 showed positive KRAS G12C mutation   PD-L1 expression 10%  PRIOR THERAPY: 1) Palliative radiotherapy to the T12 destructive pathologic fracture under the care of Dr. Kathrynn Running completed on May 10, 2021. 2) Systemic chemotherapy with carboplatin for AUC of 5, Alimta 500 Mg/M2 and Keytruda 200 Mg IV every 3 weeks. Last dose on 08/08/21. Status post 3 cycles. Discontinued due to disease progression.   CURRENT THERAPY: Krazati p.o. daily.  First dose Sep 07, 2021.  Status post 10 weeks of treatment.   INTERVAL HISTORY: Christopher Dawson 66 y.o. male and I connected via a telephone visit today.  Mobility is challenging for the patient since the patient has unable to ambulate to weakness in his lower extremities.  The weakness in his lower extremities is secondary to the significant thoracic and lumbar metastatic lesions with pathological fractures and epidural extension.  The patient is currently in a skilled nursing facility and has to come to appointments via stretcher.  He is unable to bear weight, walk, or get himself out of bed.  He is also unable to participate in any type of physical therapy?  The patient was found to have evidence of disease progression in May 2023.  Therefore, the patient is on second line targeted treatment with [place].  He has been on this for approximately 10 weeks.  He is tolerating this well without any known adverse side effects.  Today he denies any fever, chills, or night sweats.  Weight loss?  Omental drinks?  He denies any dyspnea as he is nonambulatory.  Denies any hemoptysis or chest pain.  Denies any  nausea, vomiting, diarrhea, or constipation.  The patient is currently taking ***for pain control.  He denies any rashes or skin changes.  The patient recently had a restaging CT scan performed.  He is here today for evaluation to review his scan results.  MEDICAL HISTORY: Past Medical History:  Diagnosis Date   Anxiety    Depression    Lumbar radiculopathy, chronic 2008   Marfan syndrome    Neurosis, depressive    Shortening, leg, congenital     ALLERGIES:  is allergic to codeine.  MEDICATIONS:  Current Outpatient Medications  Medication Sig Dispense Refill   acetaminophen (TYLENOL) 325 MG tablet Take 2 tablets (650 mg total) by mouth every 6 (six) hours as needed for mild pain (or Fever >/= 101). 30 tablet 0   Adagrasib (KRAZATI) 200 MG TABS Take 600 mg by mouth in the morning and at bedtime. 180 tablet 3   Amino Acids-Protein Hydrolys (PRO-STAT AWC) LIQD Take 30 mLs by mouth daily. Take 30ml by mouth daily     Ascorbic Acid (VITAMIN C) 500 MG CAPS Take 1 tablet by mouth daily.     busPIRone (BUSPAR) 7.5 MG tablet Take 7.5 mg by mouth daily.     feeding supplement (ENSURE ENLIVE / ENSURE PLUS) LIQD Take 237 mLs by mouth 3 (three) times daily between meals. 237 mL 12   fentaNYL (DURAGESIC) 12 MCG/HR Place 1 patch onto the skin every 3 (three) days.     ferrous sulfate 325 (65 FE) MG tablet Take 325 mg by mouth 2 (two)  times daily.     folic acid (FOLVITE) 1 MG tablet Take 1 tablet (1 mg total) by mouth daily. 30 tablet 4   gabapentin (NEURONTIN) 100 MG capsule Take 1 capsule (100 mg total) by mouth 3 (three) times daily. 90 capsule 0   hydrocortisone cream 1 % Apply 1 Application topically 2 (two) times daily. To back twice daily for 1 week for itch.     lactulose (CHRONULAC) 10 GM/15ML solution Take 45 mLs (30 g total) by mouth 2 (two) times daily. 236 mL 0   Multiple Vitamin (MULTIVITAMIN WITH MINERALS) TABS tablet Take 1 tablet by mouth daily. 30 tablet 0   Multiple  Vitamins-Minerals (DECUBI-VITE) CAPS Take by mouth. (Patient not taking: Reported on 10/19/2021)     ondansetron (ZOFRAN) 4 MG tablet Take 1 tablet (4 mg total) by mouth every 6 (six) hours as needed for nausea. (Patient not taking: Reported on 10/19/2021) 20 tablet 0   oxycodone (OXY-IR) 5 MG capsule Take 5 mg by mouth every 6 (six) hours as needed for pain. Take 1 tab by mouth every six hours for pain.     polyethylene glycol (MIRALAX / GLYCOLAX) 17 g packet Take 17 g by mouth daily.     prochlorperazine (COMPAZINE) 10 MG tablet Take 1 tablet (10 mg total) by mouth every 6 (six) hours as needed for nausea or vomiting. (Patient not taking: Reported on 10/19/2021) 30 tablet 0   senna-docusate (SENOKOT-S) 8.6-50 MG tablet Take 1 tablet by mouth 2 (two) times daily. 30 tablet 0   tamsulosin (FLOMAX) 0.4 MG CAPS capsule Take 1 capsule (0.4 mg total) by mouth daily after supper. 30 capsule 0   thiamine 100 MG tablet Take 1 tablet (100 mg total) by mouth daily. 30 tablet 0   No current facility-administered medications for this visit.    SURGICAL HISTORY:  Past Surgical History:  Procedure Laterality Date   LEG SURGERY      REVIEW OF SYSTEMS:   Review of Systems  Constitutional: Negative for appetite change, chills, fatigue, fever and unexpected weight change.  HENT:   Negative for mouth sores, nosebleeds, sore throat and trouble swallowing.   Eyes: Negative for eye problems and icterus.  Respiratory: Negative for cough, hemoptysis, shortness of breath and wheezing.   Cardiovascular: Negative for chest pain and leg swelling.  Gastrointestinal: Negative for abdominal pain, constipation, diarrhea, nausea and vomiting.  Genitourinary: Negative for bladder incontinence, difficulty urinating, dysuria, frequency and hematuria.   Musculoskeletal: Negative for back pain, gait problem, neck pain and neck stiffness.  Skin: Negative for itching and rash.  Neurological: Negative for dizziness, extremity  weakness, gait problem, headaches, light-headedness and seizures.  Hematological: Negative for adenopathy. Does not bruise/bleed easily.  Psychiatric/Behavioral: Negative for confusion, depression and sleep disturbance. The patient is not nervous/anxious.     PHYSICAL EXAMINATION:  There were no vitals taken for this visit.  ECOG PERFORMANCE STATUS: {CHL ONC ECOG [id number]  Physical Exam  Constitutional: Oriented to person, place, and time and well-developed, well-nourished, and in no distress. No distress.  HENT:  Head: Normocephalic and atraumatic.  Mouth/Throat: Oropharynx is clear and moist. No oropharyngeal exudate.  Eyes: Conjunctivae are normal. Right eye exhibits no discharge. Left eye exhibits no discharge. No scleral icterus.  Neck: Normal range of motion. Neck supple.  Cardiovascular: Normal rate, regular rhythm, normal heart sounds and intact distal pulses.   Pulmonary/Chest: Effort normal and breath sounds normal. No respiratory distress. No wheezes. No rales.  Abdominal: Soft. Bowel  sounds are normal. Exhibits no distension and no mass. There is no tenderness.  Musculoskeletal: Normal range of motion. Exhibits no edema.  Lymphadenopathy:    No cervical adenopathy.  Neurological: Alert and oriented to person, place, and time. Exhibits normal muscle tone. Gait normal. Coordination normal.  Skin: Skin is warm and dry. No rash noted. Not diaphoretic. No erythema. No pallor.  Psychiatric: Mood, memory and judgment normal.  Vitals reviewed.  LABORATORY DATA: Lab Results  Component Value Date   WBC 7.0 10/19/2021   HGB 8.8 (L) 10/19/2021   HCT 27.6 (L) 10/19/2021   MCV 86.8 10/19/2021   PLT 278 10/19/2021      Chemistry      Component Value Date/Time   NA 135 10/19/2021 0916   NA 146 (H) 07/04/2018 1642   K 4.0 10/19/2021 0916   CL 105 10/19/2021 0916   CO2 25 10/19/2021 0916   BUN 14 10/19/2021 0916   BUN 13 07/04/2018 1642   CREATININE 0.53 (L) 10/19/2021  0916   CREATININE 0.95 07/04/2012 1534      Component Value Date/Time   CALCIUM 8.2 (L) 10/19/2021 0916   ALKPHOS 79 10/19/2021 0916   AST 33 10/19/2021 0916   ALT 22 10/19/2021 0916   BILITOT 0.5 10/19/2021 0916       RADIOGRAPHIC STUDIES:  No results found.   ASSESSMENT/PLAN:  This is a very pleasant 66 year old Caucasian male with stage IV non-small cell lung cancer, adenosquamous carcinoma.  The patient presented with a left upper lobe spiculated nodule with peripheral stranding and osseous metastatic disease, some of which involves epidural extension.  The patient was diagnosed in January 2023.  The patient's molecular studies show he is positive for K-ras G12 C mutation which may use in the second line setting. His PDL1 expression ins negative.    The patient completed palliative radiation to the T12 lesion under the care of Dr. Kathrynn Running on 05/10/21.    He initially started palliative systemic chemotherapy with carboplatin for AUC of 5, Alimta 500 Mg/M2 and Keytruda 200 Mg IV every 3 weeks.  First cycle June 30, 2021.  Status post 3 cycles.  The patient tolerated the second cycle of his treatment fairly well except for fatigue.  Fortunately, the patient had been in such a disease progression with progression of the left upper lobe lung lesion, left hilar and subcarinal adenopathy.  He also had new sclerotic lesion at T1.  Therefore, the patient started targeted treatment with [place].  He started this in May 2023.  He has been on this for approximately 10 weeks.  He is tolerating this well without any concerning adverse side effects.   Patient recently had a restaging CT scan performed.  Dr. Arbutus Ped personally and independently reviewed the scan discussed results with the patient today.  The scan showed ***  Dr. Arbutus Ped recommends the patient continue on the same treatment at the same dose.  We will see him back for follow-up visit in approximately 1 month for evaluation and  repeat blood work.  Pain medicine?  Check anemia  The patient was advised to call immediately if she has any concerning symptoms in the interval. The patient voices understanding of current disease status and treatment options and is in agreement with the current care plan. All questions were answered. The patient knows to call the clinic with any problems, questions or concerns. We can certainly see the patient much sooner if necessary       No orders of  the defined types were placed in this encounter.    I spent {CHL ONC TIME VISIT - ZOXWR:[id number]} counseling the patient face to face. The total time spent in the appointment was {CHL ONC TIME VISIT - BJYNW:[id number]}.  Eliya Geiman L Laronda Lisby, PA-C 11/14/21

## 2021-11-15 ENCOUNTER — Encounter: Payer: Self-pay | Admitting: Family Medicine

## 2021-11-15 ENCOUNTER — Non-Acute Institutional Stay: Payer: Medicare Other | Admitting: Family Medicine

## 2021-11-15 VITALS — BP 98/64 | HR 115 | Resp 18

## 2021-11-15 DIAGNOSIS — R3989 Other symptoms and signs involving the genitourinary system: Secondary | ICD-10-CM | POA: Insufficient documentation

## 2021-11-15 DIAGNOSIS — L299 Pruritus, unspecified: Secondary | ICD-10-CM | POA: Insufficient documentation

## 2021-11-15 DIAGNOSIS — G893 Neoplasm related pain (acute) (chronic): Secondary | ICD-10-CM

## 2021-11-15 NOTE — Progress Notes (Signed)
[facility] Consult Note Telephone: [phone]  Fax: [phone]    Date of encounter: 11/15/21 9:40 AM PATIENT NAME: Christopher Dawson [address] [place] [address]   [id number] (home)  DOB: February 12, 1956 MRN: [id number] PRIMARY CARE PROVIDER:    [name], MD,  [place] [address] [phone]  REFERRING PROVIDER:   [name], MD [place],  [place] [address] [phone]  RESPONSIBLE PARTY:    Contact Information     Name Relation Home Work Mobile   Christopher Dawson Mother [phone]     Christopher Dawson   [phone]        I met face to face with patient in his assisted living facility. Palliative Care was asked to follow this patient by consultation request of  Christopher Dawson,* to address advance care planning and complex medical decision making. This is a follow up visit.  ASSESSMENT , SYMPTOM MANAGEMENT AND PLAN / RECOMMENDATIONS:   Urine discoloration Tea colored urine, starting the prior 24 hours Obtain CMP and fax results to Dr Christopher Dawson, Oncologist Increase fluid intake  2.  Pruritus of Skin Uncertain etiology, does not resemble a drug rash When coupled with urine discoloration, am concerned about possible effects on kidney and liver causing pruritus. Symptoms do not increase after administration of OxyContin. CMP as per above. Use Sarna PRN Change hydroxyzine 25 mg po Q 6 hrs prn to TID for 7-10 days to see if this will break itch-scratch-itch cycle  3.  Cancer related pain Neuropathic pain-? Need to increase Gabapentin-will discuss with Oncologist Continue Gabapentin 100 mg TID, OxyContin ER 15 mg  Q 12 hrs, Duragesic patch 12 mcg/hr-change Q 72 hours and Oxy IR 5 mg Q 6 hrs prn breakthrough pain.    Advance Care Planning/Goals of Care:  CODE STATUS: Limited Full Code without intubation    Follow up Palliative  Care Visit: Palliative care will continue to follow for complex medical decision making, advance care planning, and clarification of goals. Return 2 weeks or prn.    This visit was coded based on medical decision making (MDM).  PPS: 30%  HOSPICE ELIGIBILITY/DIAGNOSIS: TBD  Chief Complaint:  Acute visit requested per patient to discuss medications.  He c/o itching "all over" that keeps him awake at night.  HISTORY OF PRESENT ILLNESS:  Christopher Dawson is a 66 y.o. 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  Currently he is on oral chemotherapy agent Christopher Dawson, started per Dr Christopher Dawson.  He also has a hx of Marfan's syndrome,  COPD, chronic lumbar radiculopathy, thrombocytopenia, depression, hx of ETOH abuse, hypocalcemia, HLD, hyponatremia and constipation.  Pt was seen sleeping in bed on arrival and on awakening was c/o itching "all over" that keeps him up late at night and has been going on for about 4 weeks now.  He states being given a small pill but not sure that this has helped, denies any worsening of itching after taking his OxyContin.  He does have Hydroxyzine 25 mg Q 6 hrs prn ordered but unclear how often he is truly receiving it. He will scratch until he has some open pinpoint areas which now are mainly on his trunk and upper arms.  He rates his pain a 7-8 on a 10 scale "all over" but mostly concentrated in his knees, hips and low back.  He describes significant "burning"  pain.  He relates new bony lesions in the area where he had a lesion previously.  He has a foley catheter which is draining medium tea colored urine which he states started in the last 24 hours. Nursing staff corroborates that he has not had tea colored urine previously but also that his fluid intake was poor the day prior and consisted mainly of soft drinks.  He states he is continuing to get up to the chair daily where he has to sit for 2 hours.   He cannot straighten his legs and states wounds on his heels he has been told are healed.  Denies CP, SOB, nausea, vomiting or falls.   History obtained from review of EMR, discussion with facility staff and/or Christopher Dawson.   On 10/19/21: CMP remarkable for low Cr 0.53, Albumin 2.7 and Ca 8.2.  LFTs were normal. CBC remarkable for low RBC 3.18, HGB 8.8 and HCT 27.6%.  I reviewed available labs, medications, imaging, studies and related documents from the EMR.  Records reviewed and summarized above.   ROS  General: NAD Cardiovascular: denies chest pain, denies DOE Pulmonary: denies cough, denies increased SOB Abdomen: endorses fair appetite, endorses constipation, endorses continence of bowel GU: foley cath MSK:  denies increased weakness, no falls reported Skin: small pinpoint pruritic lesions which pt has scratched and opened Neurological: endorses burning pain in low back, knees and hips, endorses insomnia due to itching Psych: Endorses positive mood   Physical Exam: Current and past weights: on 06/16/21 weight 165 lbs, current weight 158 lbs as of 10/19/21 Constitutional: NAD General: frail appearing, thin CV: S1S2, regular tachycardic rhythm, no LE edema Pulmonary: CTAB, no increased work of breathing, no cough, room air Abdomen: normo-active BS + 4 quadrants, soft and non tender, no ascites GU:  foley cath in place draining medium tea colored urine MSK: noted sarcopenia of all extremities, cannot extend legs, bed or chairbound and has to have help for transfers Skin: warm and dry, pinpoint erythematous rash which has been scratched open, no other drainage noted.  Rash noted on trunk and BUE Neuro:  noted generalized weakness,  no cognitive impairment Psych: mildly anxious affect, A and O x 3 Hem/lymph/immuno: no widespread bruising   Thank you for the opportunity to participate in the care of Christopher Dawson.  The palliative care team will continue to follow. Please call our office  at [phone] if we can be of additional assistance.   Marijo Conception, FNP -C  COVID-19 PATIENT SCREENING TOOL Asked and negative response unless otherwise noted:   Have you had symptoms of covid, tested positive or been in contact with someone with symptoms/positive test in the past 5-10 days?  no

## 2021-11-16 ENCOUNTER — Telehealth: Payer: Self-pay | Admitting: Physician Assistant

## 2021-11-16 ENCOUNTER — Ambulatory Visit (HOSPITAL_COMMUNITY)
Admission: RE | Admit: 2021-11-16 | Discharge: 2021-11-16 | Disposition: A | Discharge status: Home / Self Care | Payer: Medicare Other | Source: Ambulatory Visit | Attending: Internal Medicine | Admitting: Internal Medicine

## 2021-11-16 ENCOUNTER — Encounter (HOSPITAL_COMMUNITY): Payer: Self-pay

## 2021-11-16 ENCOUNTER — Ambulatory Visit: Payer: Medicare Other | Admitting: Physician Assistant

## 2021-11-16 ENCOUNTER — Encounter: Payer: Self-pay | Admitting: Family Medicine

## 2021-11-16 ENCOUNTER — Inpatient Hospital Stay: Payer: Medicare Other | Attending: Physician Assistant

## 2021-11-16 ENCOUNTER — Inpatient Hospital Stay: Payer: Medicare Other | Admitting: Physician Assistant

## 2021-11-16 ENCOUNTER — Telehealth: Payer: Self-pay | Admitting: Family Medicine

## 2021-11-16 DIAGNOSIS — C3490 Malignant neoplasm of unspecified part of unspecified bronchus or lung: Secondary | ICD-10-CM | POA: Diagnosis not present

## 2021-11-16 MED ORDER — SODIUM CHLORIDE (PF) 0.9 % IJ SOLN
INTRAMUSCULAR | Status: AC
  Filled 2021-11-16: qty 50

## 2021-11-16 MED ORDER — IOHEXOL 300 MG/ML  SOLN
100.0000 mL | Freq: Once | INTRAMUSCULAR | Status: AC | PRN
  Administered 2021-11-16: 100 mL via INTRAVENOUS

## 2021-11-16 NOTE — Telephone Encounter (Signed)
Spoke with Suanne Marker, nurse at [facility] who has pt today.  She contacted [facility] who indicated they could not change priority of the lab to stat from my order without coming to redraw the lab.  They had just gotten the labs drawn from earlier today and would take a couple of hours to turn around for results.  Requested Suanne Marker to fax results to this provider. Attempted to discuss possible transport to [facility] for tomorrow in case additional labs or IV fluids needed and was unable to speak with a SW to arrange [facility] transport as they require 24 hours notice to arrange.  Notified Cassie Heilingoetter PA of same.  Will fax results once they are sent to me and reviewed for her attention.  She indicated that CT scans were completed today and did not show any specific problems with the kidney or liver and showed improvement of the lung lesion.  She indicated that pt could still have some effects from the med on his liver and if so would likely require Prednisone to address.  She could not get ahold of pt or facility staff for Dr Worthy Flank virtual visit at 2:30 pm and wanted to be advised about how best to reschedule him whether tomorrow at 9 am for lab and 9:30 with Dr Julien Nordmann or early next week.  Am waiting on results to advise. Damaris Hippo FNP-C

## 2021-11-16 NOTE — Telephone Encounter (Signed)
Had sent message via Epic last night to Abelina Bachelor, Oncology nurse and Dr Julien Nordmann that pt had dark urine and complaints of itching with scheduled labs and follow up CT with the [place] today.  Advised that this provider had ordered CMP for today. Received acknowledgement from Dr Julien Nordmann that they would follow up today.  Later received Epic message from [facility] PA stating that pt had CT scans done but no labs and was wanting a copy of CMP due to concern for possible hepatotoxicity with higher excretion levels of bilirubin changing urine to tea colored and contributing to itching which may be related to pt's oral chemo.  Went to facility to try and obtain CMP results.

## 2021-11-16 NOTE — Telephone Encounter (Signed)
Rhonda notified on-call that labs were not spun and could not be run.  Will re-draw stat CMP and CK in am.  Damaris Hippo FNP-C

## 2021-11-16 NOTE — Telephone Encounter (Signed)
I attempted to call the patient to review his CT scan results. Unable to reach him. No voicemail set up on his phone. I called [name] nursing facility and left a voicemail for his nurse to call back to rescheduled.

## 2021-11-17 ENCOUNTER — Telehealth: Payer: Self-pay | Admitting: Internal Medicine

## 2021-11-17 NOTE — Telephone Encounter (Signed)
.  Called patient to schedule appointment per 7/27 inbasket, patient is aware of date and time.

## 2021-11-21 ENCOUNTER — Telehealth: Payer: Self-pay

## 2021-11-21 ENCOUNTER — Inpatient Hospital Stay: Payer: Medicare Other | Admitting: Internal Medicine

## 2021-11-21 ENCOUNTER — Telehealth: Payer: Self-pay | Admitting: Internal Medicine

## 2021-11-21 ENCOUNTER — Telehealth: Payer: Self-pay | Admitting: Radiation Oncology

## 2021-11-21 NOTE — Telephone Encounter (Signed)
Pt called to say that he had not been picked up by [facility] from [facility] for apt with Dr. Julien Nordmann today Called [name] and was advised that due to an error on their part that [facility] had not been contacted. There is a new Solicitor that will schedule transportation with [facility] to apt when they receive phone call from [facility] with new date and time of apt. Attempted to call pt with this information but he did not answer. No voice mail.

## 2021-11-21 NOTE — Telephone Encounter (Signed)
Spoke with Almira Bar (transportation) at [facility] [phone], to advise of apt for Lab and Cassie PA on 8/7 @ 11:00 with arrival by 10:45. She will notify patient's nurse of apt date and time and will schedule transportation with [facility].

## 2021-11-21 NOTE — Telephone Encounter (Signed)
Scheduled per 7/31 in basket, attempted to call both numbers but no one available, will try to call later

## 2021-11-21 NOTE — Telephone Encounter (Signed)
Called to set up appt per 7/31 in basket, pt did not want to schedule at this time, said he will call back if he wants to schedule

## 2021-11-22 ENCOUNTER — Other Ambulatory Visit (HOSPITAL_COMMUNITY): Payer: Self-pay

## 2021-11-22 NOTE — Progress Notes (Signed)
[facility] OFFICE PROGRESS NOTE  [name], MD [name] [id number]  DIAGNOSIS: Stage IV (T1c, N1, M1 C) non-small cell lung cancer, adenosquamous carcinoma presented with left upper lobe lung nodule in addition to left hilar adenopathy and bone metastasis diagnosed January 2023.   Molecular studies by foundation 1 showed positive KRAS G12C mutation   PD-L1 expression 10%  PRIOR THERAPY: 1) Palliative radiotherapy to the T12 destructive pathologic fracture under the care of Dr. Tammi Klippel completed on May 10, 2021. 2) Systemic chemotherapy with carboplatin for AUC of 5, Alimta 500 Mg/M2 and Keytruda 200 Mg IV every 3 weeks. Last dose on 08/08/21. Status post 3 cycles. Discontinued due to disease progression.   CURRENT THERAPY: Krazati p.o. daily.  First dose Sep 07, 2021.  Status post 10 weeks of treatment.   INTERVAL HISTORY: Christopher Dawson 66 y.o. male returns to the clinic today for a follow up visit. Mobility is challenging for the patient since the patient has unable to ambulate to weakness in his lower extremities.  The weakness in his lower extremities is secondary to the significant thoracic and lumbar metastatic lesions with pathological fractures and epidural extension. The patient is currently in a skilled nursing facility and has to come to appointments via stretcher.    The patient was found to have evidence of disease progression in May 2023.  Therefore, the patient is on second line targeted treatment with [place].  He has been on this for approximately 11.5 weeks.  He is tolerating this well without any known adverse side effects. He started experiencing significant itching recently without rash. He denies any new lotions, soaps, detergents, or bug bites. He has been prescribed atarax 25 mg without any significant improvement. He has some dry skin. Today he denies any fever, chills, or night sweats.  He reports his appetite is  "so-'so" but it limited by the food at his SNF. He drinks one protein shake a day. He is wondering if he should buy additional protein drinks. He denies any dyspnea as he is nonambulatory.  Denies any hemoptysis or chest pain.  Denies any nausea, vomiting, diarrhea, or constipation. The patient recently had a restaging CT scan performed.  He is here today for evaluation to review his scan results.    MEDICAL HISTORY: Past Medical History:  Diagnosis Date   Anxiety    Depression    Lumbar radiculopathy, chronic 04/24/2006   Marfan syndrome    Neurosis, depressive    nscl ca with bone mets 04/2021   Shortening, leg, congenital     ALLERGIES:  is allergic to codeine.  MEDICATIONS:  Current Outpatient Medications  Medication Sig Dispense Refill   acetaminophen (TYLENOL) 325 MG tablet Take 2 tablets (650 mg total) by mouth every 6 (six) hours as needed for mild pain (or Fever >/= 101). 30 tablet 0   adagrasib (KRAZATI) 200 MG tablet Take 600 mg by mouth in the morning and at bedtime. 180 tablet 3   Amino Acids-Protein Hydrolys (PRO-STAT AWC) LIQD Take 30 mLs by mouth daily. Take 50m by mouth daily     Ascorbic Acid (VITAMIN C) 500 MG CAPS Take 1 tablet by mouth daily.     busPIRone (BUSPAR) 7.5 MG tablet Take 7.5 mg by mouth daily.     feeding supplement (ENSURE ENLIVE / ENSURE PLUS) LIQD Take 237 mLs by mouth 3 (three) times daily between meals. 237 mL 12   fentaNYL (DURAGESIC) 12 MCG/HR Place 1  patch onto the skin every 3 (three) days.     ferrous sulfate 325 (65 FE) MG tablet Take 325 mg by mouth 2 (two) times daily.     folic acid (FOLVITE) 1 MG tablet Take 1 tablet (1 mg total) by mouth daily. 30 tablet 4   gabapentin (NEURONTIN) 100 MG capsule Take 1 capsule (100 mg total) by mouth 3 (three) times daily. 90 capsule 0   hydrocortisone cream 1 % Apply 1 Application topically 2 (two) times daily. To back twice daily for 1 week for itch.     hydrOXYzine (ATARAX) 25 MG tablet Take 25 mg  by mouth 3 (three) times daily. Was Q 6 hrs prn itching, changed 11/15/21 to TID to gain control of itching     lactulose (CHRONULAC) 10 GM/15ML solution Take 45 mLs (30 g total) by mouth 2 (two) times daily. 236 mL 0   methylPREDNISolone (MEDROL DOSEPAK) 4 MG TBPK tablet Use as instructed 21 tablet 0   Multiple Vitamin (MULTIVITAMIN WITH MINERALS) TABS tablet Take 1 tablet by mouth daily. 30 tablet 0   Multiple Vitamins-Minerals (DECUBI-VITE) CAPS Take by mouth.     ondansetron (ZOFRAN) 4 MG tablet Take 1 tablet (4 mg total) by mouth every 6 (six) hours as needed for nausea. 20 tablet 0   oxycodone (OXY-IR) 5 MG capsule Take 5 mg by mouth every 6 (six) hours as needed for pain (breakthrough pain). Take 1 tab by mouth every six hours for pain.     oxyCODONE (OXYCONTIN) 15 mg 12 hr tablet Take 15 mg by mouth every 12 (twelve) hours.     polyethylene glycol (MIRALAX / GLYCOLAX) 17 g packet Take 17 g by mouth daily.     prochlorperazine (COMPAZINE) 10 MG tablet Take 1 tablet (10 mg total) by mouth every 6 (six) hours as needed for nausea or vomiting. 30 tablet 0   senna-docusate (SENOKOT-S) 8.6-50 MG tablet Take 1 tablet by mouth 2 (two) times daily. 30 tablet 0   tamsulosin (FLOMAX) 0.4 MG CAPS capsule Take 1 capsule (0.4 mg total) by mouth daily after supper. 30 capsule 0   thiamine 100 MG tablet Take 1 tablet (100 mg total) by mouth daily. 30 tablet 0   No current facility-administered medications for this visit.    SURGICAL HISTORY:  Past Surgical History:  Procedure Laterality Date   LEG SURGERY      REVIEW OF SYSTEMS:   Review of Systems  Constitutional: Positive for fatigue.  Positive for decreased appetite.  Negative for chills, fever and unexpected weight change.  HENT: Negative for mouth sores, nosebleeds, sore throat and trouble swallowing.   Eyes: Negative for eye problems and icterus.  Respiratory: Negative for hemoptysis, cough, shortness of breath and wheezing.    Cardiovascular: Negative for chest pain and leg swelling.  Gastrointestinal: Positive for occasional constipation.  Negative for abdominal pain,  diarrhea, nausea and vomiting.  Genitourinary: Negative for bladder incontinence, difficulty urinating, dysuria, frequency and hematuria.   Musculoskeletal: Negative for back pain, gait problem, neck pain and neck stiffness.  Skin: Positive for itching without rash.   Neurological: Positive for lower extremity weakness. Negative for dizziness,  gait problem, headaches, light-headedness and seizures.  Hematological: Negative for adenopathy. Does not bruise/bleed easily.  Psychiatric/Behavioral: Negative for confusion, depression and sleep disturbance. The patient is not nervous/anxious.     PHYSICAL EXAMINATION:  Blood pressure 98/83, pulse 92, temperature 98.1 F (36.7 C), temperature source Temporal, resp. rate 16, SpO2 98 %.  ECOG PERFORMANCE STATUS: 3  Physical Exam  Constitutional: Oriented to person, place, and time and thin appearing male and in no distress.  HENT:  Head: Normocephalic and atraumatic.  Mouth/Throat: Oropharynx is clear and moist. No oropharyngeal exudate.  Eyes: Conjunctivae are normal. Right eye exhibits no discharge. Left eye exhibits no discharge. No scleral icterus.  Neck: Normal range of motion. Neck supple.  Cardiovascular: Normal rate, regular rhythm, normal heart sounds and intact distal pulses.   Pulmonary/Chest: Effort normal.  Quiet breath sounds bilaterally. No respiratory distress. No wheezes. No rales.  Abdominal: Soft. Bowel sounds are normal. Exhibits no distension and no mass. There is no tenderness.  Musculoskeletal: Normal range of motion. Exhibits no edema.  Lymphadenopathy:    No cervical adenopathy.  Neurological: Alert and oriented to person, place, and time. Exhibits muscle wasting. Examined on the stretcher.  Skin: Skin is warm and dry. No rash noted. Not diaphoretic. No erythema. No pallor.   Psychiatric: Mood, memory and judgment normal.  Vitals reviewed.  LABORATORY DATA: Lab Results  Component Value Date   WBC 9.5 11/28/2021   HGB 10.2 (L) 11/28/2021   HCT 31.9 (L) 11/28/2021   MCV 85.5 11/28/2021   PLT 322 11/28/2021      Chemistry      Component Value Date/Time   NA 137 11/28/2021 1103   NA 146 (H) 07/04/2018 1642   K 4.0 11/28/2021 1103   CL 104 11/28/2021 1103   CO2 30 11/28/2021 1103   BUN 18 11/28/2021 1103   BUN 13 07/04/2018 1642   CREATININE 0.58 (L) 11/28/2021 1103   CREATININE 0.95 07/04/2012 1534      Component Value Date/Time   CALCIUM 8.5 (L) 11/28/2021 1103   ALKPHOS 64 11/28/2021 1103   AST 19 11/28/2021 1103   ALT 11 11/28/2021 1103   BILITOT 0.3 11/28/2021 1103       RADIOGRAPHIC STUDIES:  CT Chest W Contrast  Result Date: 11/16/2021 CLINICAL DATA:  Non small cell lung cancer. Metastatic disease. Assess response to treatment. * Tracking Code: BO * EXAM: CT CHEST, ABDOMEN, AND PELVIS WITH CONTRAST TECHNIQUE: Multidetector CT imaging of the chest, abdomen and pelvis was performed following the standard protocol during bolus administration of intravenous contrast. RADIATION DOSE REDUCTION: This exam was performed according to the departmental dose-optimization program which includes automated exposure control, adjustment of the mA and/or kV according to patient size and/or use of iterative reconstruction technique. CONTRAST:  18m OMNIPAQUE IOHEXOL 300 MG/ML  SOLN COMPARISON:  CT 08/31/2021, PET-CT 06/15/2021 FINDINGS: CT CHEST FINDINGS Cardiovascular: No significant vascular findings. Normal heart size. No pericardial effusion. Mediastinum/Nodes: No axillary or supraclavicular adenopathy. No mediastinal or hilar adenopathy. No pericardial fluid. Esophagus normal. Lungs/Pleura: Marked interval decrease in volume of LEFT upper lobe nodule currently measuring 12 mm by 12 mm compared to 41 mm 28 mm. Resolution of bibasilar medial parenchymal lung  consolidation seen on comparison exam. Mild atelectasis remains at the lung bases. There is peripheral subpleural interstitial thickening and cystic change again noted. No new or suspicious pulmonary nodularity. Musculoskeletal: Sclerosis of the vertebral body at T5 again noted. Lytic lesion at T9 again noted. Severe compression deformity at T12 again noted. No new metastatic lesion identified in the spine. CT ABDOMEN AND PELVIS FINDINGS Hepatobiliary: No focal hepatic lesion. No biliary ductal dilatation. Gallbladder is normal. Common bile duct is normal. Pancreas: Pancreas is normal. No ductal dilatation. No pancreatic inflammation. Spleen: Normal spleen Adrenals/urinary tract: Adrenal glands and kidneys are normal. The  ureters and bladder normal. Stomach/Bowel: Stomach, small bowel, appendix, and cecum are normal. The colon and rectosigmoid colon are normal. Vascular/Lymphatic: Abdominal aorta is normal caliber. There is no retroperitoneal or periportal lymphadenopathy. No pelvic lymphadenopathy. Reproductive: Other: No free fluid. Musculoskeletal: Small round sclerotic lesion at L1 measuring 7 mm on image 67/2 is unchanged. No metastatic lesions identified pelvis. IMPRESSION: Chest Impression: 1. Near complete resolution nodular process in the LEFT upper lobe. 2. Near complete resolution of bibasilar pneumonia. 3. No evidence of new or progressive metastatic disease in the thorax. 4. Stable lytic and sclerotic metastasis within the thoracic spine. 5. Stable pathologic compression fracture at T12. Abdomen / Pelvis Impression: 1. No visceral metastasis or adenopathy in the abdomen pelvis. 2. Stable sclerotic skeletal metastasis. Electronically Signed   By: Suzy Bouchard M.D.   On: 11/16/2021 13:19   CT Abdomen Pelvis W Contrast  Result Date: 11/16/2021 CLINICAL DATA:  Non small cell lung cancer. Metastatic disease. Assess response to treatment. * Tracking Code: BO * EXAM: CT CHEST, ABDOMEN, AND PELVIS WITH  CONTRAST TECHNIQUE: Multidetector CT imaging of the chest, abdomen and pelvis was performed following the standard protocol during bolus administration of intravenous contrast. RADIATION DOSE REDUCTION: This exam was performed according to the departmental dose-optimization program which includes automated exposure control, adjustment of the mA and/or kV according to patient size and/or use of iterative reconstruction technique. CONTRAST:  168m OMNIPAQUE IOHEXOL 300 MG/ML  SOLN COMPARISON:  CT 08/31/2021, PET-CT 06/15/2021 FINDINGS: CT CHEST FINDINGS Cardiovascular: No significant vascular findings. Normal heart size. No pericardial effusion. Mediastinum/Nodes: No axillary or supraclavicular adenopathy. No mediastinal or hilar adenopathy. No pericardial fluid. Esophagus normal. Lungs/Pleura: Marked interval decrease in volume of LEFT upper lobe nodule currently measuring 12 mm by 12 mm compared to 41 mm 28 mm. Resolution of bibasilar medial parenchymal lung consolidation seen on comparison exam. Mild atelectasis remains at the lung bases. There is peripheral subpleural interstitial thickening and cystic change again noted. No new or suspicious pulmonary nodularity. Musculoskeletal: Sclerosis of the vertebral body at T5 again noted. Lytic lesion at T9 again noted. Severe compression deformity at T12 again noted. No new metastatic lesion identified in the spine. CT ABDOMEN AND PELVIS FINDINGS Hepatobiliary: No focal hepatic lesion. No biliary ductal dilatation. Gallbladder is normal. Common bile duct is normal. Pancreas: Pancreas is normal. No ductal dilatation. No pancreatic inflammation. Spleen: Normal spleen Adrenals/urinary tract: Adrenal glands and kidneys are normal. The ureters and bladder normal. Stomach/Bowel: Stomach, small bowel, appendix, and cecum are normal. The colon and rectosigmoid colon are normal. Vascular/Lymphatic: Abdominal aorta is normal caliber. There is no retroperitoneal or periportal  lymphadenopathy. No pelvic lymphadenopathy. Reproductive: Other: No free fluid. Musculoskeletal: Small round sclerotic lesion at L1 measuring 7 mm on image 67/2 is unchanged. No metastatic lesions identified pelvis. IMPRESSION: Chest Impression: 1. Near complete resolution nodular process in the LEFT upper lobe. 2. Near complete resolution of bibasilar pneumonia. 3. No evidence of new or progressive metastatic disease in the thorax. 4. Stable lytic and sclerotic metastasis within the thoracic spine. 5. Stable pathologic compression fracture at T12. Abdomen / Pelvis Impression: 1. No visceral metastasis or adenopathy in the abdomen pelvis. 2. Stable sclerotic skeletal metastasis. Electronically Signed   By: SSuzy BouchardM.D.   On: 11/16/2021 13:19     ASSESSMENT/PLAN:  This is a very pleasant 66year old Caucasian male with stage IV non-small cell lung cancer, adenosquamous carcinoma.  The patient presented with a left upper lobe spiculated nodule with peripheral  stranding and osseous metastatic disease, some of which involves epidural extension.  The patient was diagnosed in January 2023.  The patient's molecular studies show he is positive for K-ras G12 C mutation which may use in the second line setting. His PDL1 expression ins negative.    The patient completed palliative radiation to the T12 lesion under the care of Dr. Tammi Klippel on 05/10/21.    He initially started palliative systemic chemotherapy with carboplatin for AUC of 5, Alimta 500 Mg/M2 and Keytruda 200 Mg IV every 3 weeks.  First cycle June 30, 2021.  Status post 3 cycles.  The patient tolerated the second cycle of his treatment fairly well except for fatigue.  Fortunately, the patient had been in such a disease progression with progression of the left upper lobe lung lesion, left hilar and subcarinal adenopathy.  He also had new sclerotic lesion at T1.  Therefore, the patient started targeted treatment with [place].  He started this in  May 2023.  He has been on this for approximately 11.5 weeks.  He is tolerating this well without any concerning adverse side effects.   Patient recently had a restaging CT scan performed.  Dr. Julien Nordmann personally and independently reviewed the scan discussed results with the patient today.  The scan showed a positive response to treatment with Near complete resolution nodular process in the LEFT upper lobe lesion. There is no evidence of new or progressive metastatic disease.   Dr. Julien Nordmann recommends the patient continue on the same treatment at the same dose.  We will see him back for follow-up visit in approximately 1 month for evaluation and repeat blood work.  For his itching, Dr. Julien Nordmann recommends a medrol dose pack. There is a drug to drug interaction with prednisone. Krazati increases the serum concentrations of prednisone. Reviewed with Dr. Julien Nordmann. Since this is a short term prescription, ok to prescribe per Dr. Julien Nordmann. Reviewed side effects of steroids with the patient including insomnia and increased appetite. No dermatologic toxicity noted as a common side effect of Krazati. If no improvement in his itching, would recommend he continue to follow with his PCP for additional recommendations if no improvement with atarax.   The patient was advised to call immediately if she has any concerning symptoms in the interval. The patient voices understanding of current disease status and treatment options and is in agreement with the current care plan. All questions were answered. The patient knows to call the clinic with any problems, questions or concerns. We can certainly see the patient much sooner if necessary  No orders of the defined types were placed in this encounter.     Renika Shiflet L Kairon Shock, PA-C 11/28/21  ADDENDUM: Hematology/Oncology Attending: I had a face-to-face encounter with the patient today.  I reviewed his record, lab, scan and recommended his care plan.  This is a  very pleasant 66 years old white male diagnosed with a stage IV non-small cell lung cancer, adenosquamous carcinoma diagnosed in January 2023 with positive KRAS G12C mutation and negative PD-L1 expression.  The patient received palliative radiotherapy to the T12 metastatic bone lesion under the care of Dr. Tammi Klippel. He was initially treated with systemic chemotherapy with carboplatin, Alimta and Keytruda status post 3 cycles this was discontinued secondary to intolerance and disease progression. The patient started second line treatment with targeted therapy with Krazati (Adagrasib) 600 mg p.o. twice daily status post 3 months of treatment and he has been tolerating this treatment well with no concerning adverse effects. Repeat  CT scan of the chest, abdomen and pelvis performed recently showed significant improvement of his disease. I recommended for the patient to continue his current treatment with [name] (Adagrasib) with the same dose. Will continue to monitor his blood work closely and the patient will come back for follow-up visit in 1 months for evaluation and repeat blood work. He was advised to call immediately if he has any concerning symptoms in the interval The total time spent in the appointment was 30 minutes. Disclaimer: This note was dictated with voice recognition software. Similar sounding words can inadvertently be transcribed and may be missed upon review. [name], MD

## 2021-11-24 ENCOUNTER — Other Ambulatory Visit (HOSPITAL_COMMUNITY): Payer: Self-pay

## 2021-11-28 ENCOUNTER — Encounter: Payer: Self-pay | Admitting: Physician Assistant

## 2021-11-28 ENCOUNTER — Inpatient Hospital Stay: Payer: Medicare Other | Attending: Physician Assistant

## 2021-11-28 ENCOUNTER — Inpatient Hospital Stay (HOSPITAL_BASED_OUTPATIENT_CLINIC_OR_DEPARTMENT_OTHER): Payer: Medicare Other | Admitting: Physician Assistant

## 2021-11-28 VITALS — BP 98/83 | HR 92 | Temp 98.1°F | Resp 16

## 2021-11-28 DIAGNOSIS — C3412 Malignant neoplasm of upper lobe, left bronchus or lung: Secondary | ICD-10-CM | POA: Insufficient documentation

## 2021-11-28 DIAGNOSIS — Z923 Personal history of irradiation: Secondary | ICD-10-CM | POA: Insufficient documentation

## 2021-11-28 DIAGNOSIS — L299 Pruritus, unspecified: Secondary | ICD-10-CM | POA: Diagnosis not present

## 2021-11-28 DIAGNOSIS — C3492 Malignant neoplasm of unspecified part of left bronchus or lung: Secondary | ICD-10-CM

## 2021-11-28 DIAGNOSIS — C7951 Secondary malignant neoplasm of bone: Secondary | ICD-10-CM | POA: Diagnosis not present

## 2021-11-28 DIAGNOSIS — Z79899 Other long term (current) drug therapy: Secondary | ICD-10-CM | POA: Insufficient documentation

## 2021-11-28 DIAGNOSIS — Z7952 Long term (current) use of systemic steroids: Secondary | ICD-10-CM | POA: Diagnosis not present

## 2021-11-28 DIAGNOSIS — Z79891 Long term (current) use of opiate analgesic: Secondary | ICD-10-CM | POA: Diagnosis not present

## 2021-11-28 LAB — CBC WITH DIFFERENTIAL (CANCER CENTER ONLY)
Abs Immature Granulocytes: 0.1 10*3/uL — ABNORMAL HIGH (ref 0.00–0.07)
Basophils Absolute: 0 10*3/uL (ref 0.0–0.1)
Basophils Relative: 0 %
Eosinophils Absolute: 0.8 10*3/uL — ABNORMAL HIGH (ref 0.0–0.5)
Eosinophils Relative: 8 %
HCT: 31.9 % — ABNORMAL LOW (ref 39.0–52.0)
Hemoglobin: 10.2 g/dL — ABNORMAL LOW (ref 13.0–17.0)
Immature Granulocytes: 1 %
Lymphocytes Relative: 8 %
Lymphs Abs: 0.7 10*3/uL (ref 0.7–4.0)
MCH: 27.3 pg (ref 26.0–34.0)
MCHC: 32 g/dL (ref 30.0–36.0)
MCV: 85.5 fL (ref 80.0–100.0)
Monocytes Absolute: 1.1 10*3/uL — ABNORMAL HIGH (ref 0.1–1.0)
Monocytes Relative: 12 %
Neutro Abs: 6.7 10*3/uL (ref 1.7–7.7)
Neutrophils Relative %: 71 %
Platelet Count: 322 10*3/uL (ref 150–400)
RBC: 3.73 MIL/uL — ABNORMAL LOW (ref 4.22–5.81)
RDW: 17 % — ABNORMAL HIGH (ref 11.5–15.5)
WBC Count: 9.5 10*3/uL (ref 4.0–10.5)
nRBC: 0 % (ref 0.0–0.2)

## 2021-11-28 LAB — CMP (CANCER CENTER ONLY)
ALT: 11 U/L (ref 0–44)
AST: 19 U/L (ref 15–41)
Albumin: 3.3 g/dL — ABNORMAL LOW (ref 3.5–5.0)
Alkaline Phosphatase: 64 U/L (ref 38–126)
Anion gap: 3 — ABNORMAL LOW (ref 5–15)
BUN: 18 mg/dL (ref 8–23)
CO2: 30 mmol/L (ref 22–32)
Calcium: 8.5 mg/dL — ABNORMAL LOW (ref 8.9–10.3)
Chloride: 104 mmol/L (ref 98–111)
Creatinine: 0.58 mg/dL — ABNORMAL LOW (ref 0.61–1.24)
GFR, Estimated: >60 mL/min (ref >60)
Glucose, Bld: 95 mg/dL (ref 70–99)
Potassium: 4 mmol/L (ref 3.5–5.1)
Sodium: 137 mmol/L (ref 135–145)
Total Bilirubin: 0.3 mg/dL (ref 0.3–1.2)
Total Protein: 6.1 g/dL — ABNORMAL LOW (ref 6.5–8.1)

## 2021-11-28 MED ORDER — METHYLPREDNISOLONE 4 MG PO TBPK: ORAL_TABLET | ORAL | 0 refills | Status: DC

## 2021-12-01 ENCOUNTER — Telehealth: Payer: Self-pay

## 2021-12-01 NOTE — Telephone Encounter (Signed)
Pt LVM stating he is on day 3 of his Medrol dosepak with no relief of his itching and wants something "that will work"  Discussed with Cassandra, PA-C who advised that pt needs to follow-up with his PCP. His itching is not related to his [place] tx.  I attempted to call the pt to advise of this but there was no answer and his VM is not setup. I also attempted to call [name] and there was no answer.

## 2021-12-05 ENCOUNTER — Encounter: Payer: Self-pay | Admitting: Internal Medicine

## 2021-12-05 ENCOUNTER — Other Ambulatory Visit (HOSPITAL_COMMUNITY): Payer: Self-pay

## 2021-12-05 NOTE — Telephone Encounter (Signed)
err

## 2021-12-07 ENCOUNTER — Telehealth: Payer: Self-pay | Admitting: Internal Medicine

## 2021-12-07 NOTE — Telephone Encounter (Signed)
Rescheduled September appointments, called patient multiple times to inform but voicemail is not set up. Calender will be mailed.

## 2021-12-15 ENCOUNTER — Telehealth: Payer: Self-pay | Admitting: Medical Oncology

## 2021-12-15 NOTE — Telephone Encounter (Signed)
Flu vaccine reminder-I gave message to pts nurse that  his PCP called him to remind him to get a flu vaccine.to

## 2021-12-15 NOTE — Telephone Encounter (Signed)
I called the pt again in an attempt to determine the reason for his previous call. Pt has been advised of Diane's message below. Pt has been strongly encouraged to set up his VM so he can retrieve his messages and reminder calls for his appts. Pt was not very amendable to this but said he would try.  Pt also asked when his next appt is and he was advised 12/28/21 with an arrival time of 10:30am.

## 2021-12-20 ENCOUNTER — Other Ambulatory Visit: Payer: Self-pay | Admitting: Internal Medicine

## 2021-12-20 ENCOUNTER — Other Ambulatory Visit (HOSPITAL_COMMUNITY): Payer: Self-pay

## 2021-12-20 DIAGNOSIS — C3492 Malignant neoplasm of unspecified part of left bronchus or lung: Secondary | ICD-10-CM

## 2021-12-20 MED ORDER — KRAZATI 200 MG PO TABS
600.0000 mg | ORAL_TABLET | Freq: Two times a day (BID) | ORAL | 3 refills | Status: DC
  Filled 2021-12-20: qty 180, 30d supply, fill #0
  Filled 2022-01-23: qty 180, 30d supply, fill #1
  Filled 2022-02-14: qty 180, 30d supply, fill #2
  Filled 2022-03-15: qty 180, 30d supply, fill #3

## 2021-12-21 ENCOUNTER — Other Ambulatory Visit (HOSPITAL_COMMUNITY): Payer: Self-pay

## 2021-12-23 ENCOUNTER — Other Ambulatory Visit (HOSPITAL_COMMUNITY): Payer: Self-pay

## 2021-12-28 ENCOUNTER — Inpatient Hospital Stay: Payer: Medicare Other | Admitting: Internal Medicine

## 2021-12-28 ENCOUNTER — Inpatient Hospital Stay: Payer: Medicare Other

## 2021-12-28 ENCOUNTER — Other Ambulatory Visit: Payer: Medicare Other

## 2021-12-28 ENCOUNTER — Ambulatory Visit: Payer: Medicare Other | Admitting: Internal Medicine

## 2022-01-06 ENCOUNTER — Ambulatory Visit: Payer: Medicare Other | Admitting: Physician Assistant

## 2022-01-06 ENCOUNTER — Other Ambulatory Visit: Payer: Medicare Other

## 2022-01-14 NOTE — Progress Notes (Unsigned)
[place] OFFICE PROGRESS NOTE  [name], MD [place] [address]  DIAGNOSIS: Stage IV (T1c, N1, M1 C) non-small cell lung cancer, adenosquamous carcinoma presented with left upper lobe lung nodule in addition to left hilar adenopathy and bone metastasis diagnosed January 2023.   Molecular studies by foundation 1 showed positive KRAS G12C mutation   PD-L1 expression 10%  PRIOR THERAPY: 1) Palliative radiotherapy to the T12 destructive pathologic fracture under the care of Dr. Tammi Klippel completed on May 10, 2021. 2) Systemic chemotherapy with carboplatin for AUC of 5, Alimta 500 Mg/M2 and Keytruda 200 Mg IV every 3 weeks. Last dose on 08/08/21. Status post 3 cycles. Discontinued due to disease progression.   CURRENT THERAPY: Krazati p.o. daily.  First dose Sep 07, 2021.  Status post 4 months of treatment.   INTERVAL HISTORY: Christopher Dawson 66 y.o. male returns to the clinic for a follow-up visit.  The patient's mobility is challenging since he is unable to ambulate due to weakness in the lower extremities secondary to thoracic and lumbar metastatic lesions with pathological fractures and epidural extension.  The patient is still in a skilled nursing facility and comes to appointments via stretcher. The patient really wants to return home but it is very unlikely he would be able to take care of himself and does not have good family support.   The patient was last seen on 11/28/2021.  He was lost to follow-up for a few weeks.  It is challenging to reach the patient as his voicemail is not set up.  Today, he reports he has since set up his voicemail. The patient states he had to cancel his last appointment due to having COVID-19. At his last appointment, the patient had a restaging CT scan which showed improvement in his disease and a positive response to treatment. Since last being seen the patient denies any changes in his health. He has been  having itching without rash for several weeks. He was given atarax without improvement. We prescribed a medrol dosepak without improvement. Unclear if his SNF has been using different lotions, soaps, or detergents.  He denies any fever, chills, or night sweats.  He drinks 1 protein shake per day.  Denies any chest pain or hemoptysis. He does not exert himself so he denies DOE. He developed a cough after covid-19. He reports it is mild and he does not need to take anything for it. Denies any nausea, vomiting, diarrhea. He has some baseline constipation for which he takes a laxative and stool softeners.  He is here today for evaluation and repeat blood work.    MEDICAL HISTORY: Past Medical History:  Diagnosis Date   Anxiety    Depression    Lumbar radiculopathy, chronic 04/24/2006   Marfan syndrome    Neurosis, depressive    nscl ca with bone mets 04/2021   Shortening, leg, congenital     ALLERGIES:  is allergic to codeine.  MEDICATIONS:  Current Outpatient Medications  Medication Sig Dispense Refill   acetaminophen (TYLENOL) 325 MG tablet Take 2 tablets (650 mg total) by mouth every 6 (six) hours as needed for mild pain (or Fever >/= 101). 30 tablet 0   adagrasib (KRAZATI) 200 MG tablet Take 600 mg by mouth in the morning and at bedtime. 180 tablet 3   Amino Acids-Protein Hydrolys (PRO-STAT AWC) LIQD Take 30 mLs by mouth daily. Take 70m by mouth daily     Ascorbic Acid (VITAMIN C)  500 MG CAPS Take 1 tablet by mouth daily.     busPIRone (BUSPAR) 7.5 MG tablet Take 7.5 mg by mouth daily.     feeding supplement (ENSURE ENLIVE / ENSURE PLUS) LIQD Take 237 mLs by mouth 3 (three) times daily between meals. 237 mL 12   fentaNYL (DURAGESIC) 12 MCG/HR Place 1 patch onto the skin every 3 (three) days.     ferrous sulfate 325 (65 FE) MG tablet Take 325 mg by mouth 2 (two) times daily.     folic acid (FOLVITE) 1 MG tablet Take 1 tablet (1 mg total) by mouth daily. 30 tablet 4   gabapentin  (NEURONTIN) 100 MG capsule Take 1 capsule (100 mg total) by mouth 3 (three) times daily. 90 capsule 0   hydrocortisone cream 1 % Apply 1 Application topically 2 (two) times daily. To back twice daily for 1 week for itch.     hydrOXYzine (ATARAX) 25 MG tablet Take 25 mg by mouth 3 (three) times daily. Was Q 6 hrs prn itching, changed 11/15/21 to TID to gain control of itching     lactulose (CHRONULAC) 10 GM/15ML solution Take 45 mLs (30 g total) by mouth 2 (two) times daily. 236 mL 0   methylPREDNISolone (MEDROL DOSEPAK) 4 MG TBPK tablet Use as instructed 21 tablet 0   Multiple Vitamin (MULTIVITAMIN WITH MINERALS) TABS tablet Take 1 tablet by mouth daily. 30 tablet 0   Multiple Vitamins-Minerals (DECUBI-VITE) CAPS Take by mouth.     ondansetron (ZOFRAN) 4 MG tablet Take 1 tablet (4 mg total) by mouth every 6 (six) hours as needed for nausea. 20 tablet 0   oxycodone (OXY-IR) 5 MG capsule Take 5 mg by mouth every 6 (six) hours as needed for pain (breakthrough pain). Take 1 tab by mouth every six hours for pain.     oxyCODONE (OXYCONTIN) 15 mg 12 hr tablet Take 15 mg by mouth every 12 (twelve) hours.     polyethylene glycol (MIRALAX / GLYCOLAX) 17 g packet Take 17 g by mouth daily.     prochlorperazine (COMPAZINE) 10 MG tablet Take 1 tablet (10 mg total) by mouth every 6 (six) hours as needed for nausea or vomiting. 30 tablet 0   senna-docusate (SENOKOT-S) 8.6-50 MG tablet Take 1 tablet by mouth 2 (two) times daily. 30 tablet 0   tamsulosin (FLOMAX) 0.4 MG CAPS capsule Take 1 capsule (0.4 mg total) by mouth daily after supper. 30 capsule 0   thiamine 100 MG tablet Take 1 tablet (100 mg total) by mouth daily. 30 tablet 0   No current facility-administered medications for this visit.    SURGICAL HISTORY:  Past Surgical History:  Procedure Laterality Date   LEG SURGERY      REVIEW OF SYSTEMS:   Constitutional: Positive for stable fatigue. Negative for chills, fever and unexpected weight change.   HENT: Negative for mouth sores, nosebleeds, sore throat and trouble swallowing.   Eyes: Negative for eye problems and icterus.  Respiratory: Positive for cough. Negative for hemoptysis, shortness of breath and wheezing.   Cardiovascular: Negative for chest pain and leg swelling.  Gastrointestinal: Positive for occasional constipation.  Negative for abdominal pain,  diarrhea, nausea and vomiting.  Genitourinary: Negative for bladder incontinence, difficulty urinating, dysuria, frequency and hematuria.   Musculoskeletal: Negative for back pain, gait problem, neck pain and neck stiffness.  Skin: Positive for itching without rash.   Neurological: Positive for lower extremity weakness. Negative for dizziness,  gait problem, headaches, light-headedness and  seizures.  Hematological: Negative for adenopathy. Does not bruise/bleed easily.  Psychiatric/Behavioral: Negative for confusion, depression and sleep disturbance. The patient is not nervous/anxious.     PHYSICAL EXAMINATION:  Blood pressure 122/79, pulse 92, temperature 98.6 F (37 C), temperature source Oral, resp. rate 15, SpO2 96 %.  ECOG PERFORMANCE STATUS: 3  Physical Exam  Constitutional: Oriented to person, place, and time and thin appearing male and in no distress.  HENT:  Head: Normocephalic and atraumatic.  Mouth/Throat: Oropharynx is clear and moist. No oropharyngeal exudate.  Eyes: Conjunctivae are normal. Right eye exhibits no discharge. Left eye exhibits no discharge. No scleral icterus.  Neck: Normal range of motion. Neck supple.  Cardiovascular: Normal rate, regular rhythm, normal heart sounds and intact distal pulses.   Pulmonary/Chest: Effort normal.  Quiet breath sounds bilaterally. No respiratory distress. No wheezes. No rales.  Abdominal: Soft. Bowel sounds are normal. Exhibits no distension and no mass. There is no tenderness.  Musculoskeletal: Normal range of motion. Exhibits no edema.  Lymphadenopathy:    No  cervical adenopathy.  Neurological: Alert and oriented to person, place, and time. Exhibits muscle wasting. Examined on the stretcher.  Skin: Skin is warm and dry. No rash noted. Not diaphoretic. No erythema. No pallor.  Psychiatric: Mood, memory and judgment normal.  Vitals reviewed.  LABORATORY DATA: Lab Results  Component Value Date   WBC 8.2 01/17/2022   HGB 10.3 (L) 01/17/2022   HCT 32.7 (L) 01/17/2022   MCV 84.7 01/17/2022   PLT 354 01/17/2022      Chemistry      Component Value Date/Time   NA 139 01/17/2022 1122   NA 146 (H) 07/04/2018 1642   K 3.5 01/17/2022 1122   CL 106 01/17/2022 1122   CO2 32 01/17/2022 1122   BUN 13 01/17/2022 1122   BUN 13 07/04/2018 1642   CREATININE 0.73 01/17/2022 1122   CREATININE 0.95 07/04/2012 1534      Component Value Date/Time   CALCIUM 8.7 (L) 01/17/2022 1122   ALKPHOS 52 01/17/2022 1122   AST 13 (L) 01/17/2022 1122   ALT 9 01/17/2022 1122   BILITOT 0.3 01/17/2022 1122       RADIOGRAPHIC STUDIES:  No results found.   ASSESSMENT/PLAN:  This is a very pleasant 66 year old Caucasian male with stage IV non-small cell lung cancer, adenosquamous carcinoma.  The patient presented with a left upper lobe spiculated nodule with peripheral stranding and osseous metastatic disease, some of which involves epidural extension.  The patient was diagnosed in January 2023.  The patient's molecular studies show he is positive for K-ras G12 C mutation which may use in the second line setting. His PDL1 expression ins negative.    The patient completed palliative radiation to the T12 lesion under the care of Dr. Tammi Klippel on 05/10/21.    He initially started palliative systemic chemotherapy with carboplatin for AUC of 5, Alimta 500 Mg/M2 and Keytruda 200 Mg IV every 3 weeks.  First cycle June 30, 2021.  Status post 3 cycles.  The patient tolerated the second cycle of his treatment fairly well except for fatigue. Unfortunately, the patient had been in  such a disease progression with progression of the left upper lobe lung lesion, left hilar and subcarinal adenopathy.  He also had new sclerotic lesion at T1.   Therefore, the patient started targeted treatment with [place].  He started this in May 2023.  He has been on this for approximately 4 months.  He is tolerating this  well without any concerning adverse side effects.   At his last appointment, he had a restating CT scan that showed a positive response to treatment.   The patient was seen with Dr. Julien Nordmann today. Labs were reviewed. Recommend that he continue on the same treatment at the same dose.   We will see him back for a follow up visit in 4 weeks for evaluation and to review his next scan.   I will arrange for restaging CT scan of the chest, abdomen, and pelvis prior to his next appointment.  The patient mentions that he got sick after drinking the last oral contrast.  I strongly encouraged him to drink the water down contrast that is available at the CT department.  He will need to arrive 2 hours early in order to drink the oral contrast prior to his scan.  He was given the number to the radiology scheduling to ensure that his scan is scheduled appropriately.  The patient was lost to follow-up for a few weeks.  Reviewed the importance of [place] seeing him regularly for toxicity checks and routine blood work.  He did recently set up his voicemail which will hopefully will ensure that he is being seen at recommended intervals.  The patient mentions today that he would like to be discharged from the SNF.  Dr. Julien Nordmann discussed that we would not recommend that he be discharged unless that is what the skilled nursing facility recommends based on his evaluation by physical therapy.  It is unlikely that they recommend him for discharge as he is unable to ambulate or perform his ADLs.  The patient does not have good family support at home either.   Regarding the itching, the patient does not have  any improvement with a Medrol Dosepak or Atarax.  We recommend that he follow-up with his PCP at [facility] for additional recommendations.  He does have very dry skin and he likely will need to apply lotion regularly.  The patient was advised to call immediately if she has any concerning symptoms in the interval. The patient voices understanding of current disease status and treatment options and is in agreement with the current care plan. All questions were answered. The patient knows to call the clinic with any problems, questions or concerns. We can certainly see the patient much sooner if necessary         Orders Placed This Encounter  Procedures   CT Chest W Contrast    Standing Status:   Future    Standing Expiration Date:   01/17/2023    Order Specific Question:   If indicated for the ordered procedure, I authorize the administration of contrast media per Radiology protocol    Answer:   Yes    Order Specific Question:   Preferred imaging location?    Answer:   [facility]   CT Abdomen Pelvis W Contrast    Standing Status:   Future    Standing Expiration Date:   01/17/2023    Order Specific Question:   If indicated for the ordered procedure, I authorize the administration of contrast media per Radiology protocol    Answer:   Yes    Order Specific Question:   Preferred imaging location?    Answer:   [facility]    Order Specific Question:   Is Oral Contrast requested for this exam?    Answer:   Yes, Per Radiology protocol   CBC with Differential ([facility] Only)  Standing Status:   Future    Standing Expiration Date:   01/18/2023   CMP ([place] only)    Standing Status:   Future    Standing Expiration Date:   01/18/2023      Tobe Sos Tityana Pagan, PA-C 01/17/22  ADDENDUM: Hematology/Oncology Attending: I had a face-to-face encounter with the patient today.  I reviewed his records, lab and recommended his care plan.  This is a very  pleasant 67 years old white male with a stage IV non-small cell lung cancer, adenosquamous carcinoma presented with left upper lobe lung nodule in addition to left hilar adenopathy and bone metastasis with destructive lesion at the T12 status post palliative radiotherapy under the care of Dr. Tammi Klippel.  The patient had molecular studies by foundation 1 that showed positive KRAS G12C mutation and PD-L1 expression of 10%. He initially started systemic chemotherapy with carboplatin, Alimta and Keytruda for 3 cycles but this was discontinued secondary to disease progression. He started second line treatment with targeted therapy with Krazati (Adagrasib) 600 mg p.o. twice daily on Sep 07, 2021 and has been tolerating this treatment well with no concerning adverse effects. He continues to have the generalized weakness in the lower extremity secondary to his destructive lesion at the T12 and he is currently on a skilled nursing facility.  He would like to go home at some point but he is too weak and risky to go home at this point. Blood work is unremarkable today. I recommended for the patient to continue his current treatment with [name] (Adagrasib). We will see him back for follow-up visit in 1 months for evaluation with repeat CT scan of the chest, abdomen and pelvis for restaging of his disease. The patient was advised to call immediately if he has any other concerning symptoms in the interval. The total time spent in the appointment was 30 minutes. Disclaimer: This note was dictated with voice recognition software. Similar sounding words can inadvertently be transcribed and may be missed upon review. [name], MD

## 2022-01-16 ENCOUNTER — Telehealth: Payer: Self-pay | Admitting: Physician Assistant

## 2022-01-17 ENCOUNTER — Inpatient Hospital Stay (HOSPITAL_BASED_OUTPATIENT_CLINIC_OR_DEPARTMENT_OTHER): Payer: Medicare Other | Admitting: Physician Assistant

## 2022-01-17 ENCOUNTER — Inpatient Hospital Stay: Payer: Medicare Other | Attending: Physician Assistant

## 2022-01-17 VITALS — BP 122/79 | HR 92 | Temp 98.6°F | Resp 15

## 2022-01-17 DIAGNOSIS — Z923 Personal history of irradiation: Secondary | ICD-10-CM | POA: Diagnosis not present

## 2022-01-17 DIAGNOSIS — C3492 Malignant neoplasm of unspecified part of left bronchus or lung: Secondary | ICD-10-CM | POA: Diagnosis not present

## 2022-01-17 DIAGNOSIS — Z5111 Encounter for antineoplastic chemotherapy: Secondary | ICD-10-CM | POA: Diagnosis not present

## 2022-01-17 DIAGNOSIS — C7951 Secondary malignant neoplasm of bone: Secondary | ICD-10-CM | POA: Insufficient documentation

## 2022-01-17 DIAGNOSIS — Z7952 Long term (current) use of systemic steroids: Secondary | ICD-10-CM | POA: Insufficient documentation

## 2022-01-17 DIAGNOSIS — C3412 Malignant neoplasm of upper lobe, left bronchus or lung: Secondary | ICD-10-CM | POA: Insufficient documentation

## 2022-01-17 DIAGNOSIS — Z79899 Other long term (current) drug therapy: Secondary | ICD-10-CM | POA: Insufficient documentation

## 2022-01-17 DIAGNOSIS — C3490 Malignant neoplasm of unspecified part of unspecified bronchus or lung: Secondary | ICD-10-CM

## 2022-01-17 LAB — CBC WITH DIFFERENTIAL (CANCER CENTER ONLY)
Abs Immature Granulocytes: 0.04 10*3/uL (ref 0.00–0.07)
Basophils Absolute: 0 10*3/uL (ref 0.0–0.1)
Basophils Relative: 0 %
Eosinophils Absolute: 0.7 10*3/uL — ABNORMAL HIGH (ref 0.0–0.5)
Eosinophils Relative: 8 %
HCT: 32.7 % — ABNORMAL LOW (ref 39.0–52.0)
Hemoglobin: 10.3 g/dL — ABNORMAL LOW (ref 13.0–17.0)
Immature Granulocytes: 1 %
Lymphocytes Relative: 8 %
Lymphs Abs: 0.7 10*3/uL (ref 0.7–4.0)
MCH: 26.7 pg (ref 26.0–34.0)
MCHC: 31.5 g/dL (ref 30.0–36.0)
MCV: 84.7 fL (ref 80.0–100.0)
Monocytes Absolute: 0.9 10*3/uL (ref 0.1–1.0)
Monocytes Relative: 11 %
Neutro Abs: 5.9 10*3/uL (ref 1.7–7.7)
Neutrophils Relative %: 72 %
Platelet Count: 354 10*3/uL (ref 150–400)
RBC: 3.86 MIL/uL — ABNORMAL LOW (ref 4.22–5.81)
RDW: 15.3 % (ref 11.5–15.5)
WBC Count: 8.2 10*3/uL (ref 4.0–10.5)
nRBC: 0 % (ref 0.0–0.2)

## 2022-01-17 LAB — CMP (CANCER CENTER ONLY)
ALT: 9 U/L (ref 0–44)
AST: 13 U/L — ABNORMAL LOW (ref 15–41)
Albumin: 3.3 g/dL — ABNORMAL LOW (ref 3.5–5.0)
Alkaline Phosphatase: 52 U/L (ref 38–126)
Anion gap: 1 — ABNORMAL LOW (ref 5–15)
BUN: 13 mg/dL (ref 8–23)
CO2: 32 mmol/L (ref 22–32)
Calcium: 8.7 mg/dL — ABNORMAL LOW (ref 8.9–10.3)
Chloride: 106 mmol/L (ref 98–111)
Creatinine: 0.73 mg/dL (ref 0.61–1.24)
GFR, Estimated: >60 mL/min (ref >60)
Glucose, Bld: 103 mg/dL — ABNORMAL HIGH (ref 70–99)
Potassium: 3.5 mmol/L (ref 3.5–5.1)
Sodium: 139 mmol/L (ref 135–145)
Total Bilirubin: 0.3 mg/dL (ref 0.3–1.2)
Total Protein: 6.2 g/dL — ABNORMAL LOW (ref 6.5–8.1)

## 2022-01-19 ENCOUNTER — Other Ambulatory Visit (HOSPITAL_COMMUNITY): Payer: Self-pay

## 2022-01-23 ENCOUNTER — Other Ambulatory Visit (HOSPITAL_COMMUNITY): Payer: Self-pay

## 2022-01-24 ENCOUNTER — Other Ambulatory Visit (HOSPITAL_COMMUNITY): Payer: Self-pay

## 2022-01-24 NOTE — Telephone Encounter (Signed)
Scheduled per 09/26 los, patient has been called and voicemail was left.

## 2022-01-26 ENCOUNTER — Other Ambulatory Visit (HOSPITAL_COMMUNITY): Payer: Self-pay

## 2022-01-30 ENCOUNTER — Telehealth: Payer: Self-pay | Admitting: Medical Oncology

## 2022-01-30 NOTE — Telephone Encounter (Signed)
Trying to set up appt with pt over the phone and he cannot comprehend or remember the appts.  I sent message for Ct and scheduling to call [name] with appt.

## 2022-02-01 ENCOUNTER — Telehealth: Payer: Self-pay | Admitting: Medical Oncology

## 2022-02-01 NOTE — Telephone Encounter (Signed)
LVM for [name] to call re appts .

## 2022-02-02 ENCOUNTER — Telehealth: Payer: Self-pay | Admitting: Medical Oncology

## 2022-02-02 NOTE — Telephone Encounter (Signed)
LVM with ? Rhonda with pts appt dates and times. I asked for a return call.

## 2022-02-14 ENCOUNTER — Ambulatory Visit: Payer: Medicare Other | Admitting: Physician Assistant

## 2022-02-14 ENCOUNTER — Ambulatory Visit (HOSPITAL_COMMUNITY): Payer: Medicare Other

## 2022-02-14 ENCOUNTER — Other Ambulatory Visit (HOSPITAL_COMMUNITY): Payer: Self-pay

## 2022-02-14 ENCOUNTER — Inpatient Hospital Stay: Payer: Medicare Other | Attending: Physician Assistant

## 2022-02-15 ENCOUNTER — Ambulatory Visit (HOSPITAL_COMMUNITY)
Admission: RE | Admit: 2022-02-15 | Discharge: 2022-02-15 | Disposition: A | Discharge status: Home / Self Care | Payer: Medicare Other | Source: Ambulatory Visit | Attending: Physician Assistant | Admitting: Physician Assistant

## 2022-02-15 DIAGNOSIS — C3492 Malignant neoplasm of unspecified part of left bronchus or lung: Secondary | ICD-10-CM | POA: Diagnosis present

## 2022-02-15 MED ORDER — SODIUM CHLORIDE (PF) 0.9 % IJ SOLN
INTRAMUSCULAR | Status: AC
  Filled 2022-02-15: qty 50

## 2022-02-15 MED ORDER — IOHEXOL 300 MG/ML  SOLN
100.0000 mL | Freq: Once | INTRAMUSCULAR | Status: AC | PRN
  Administered 2022-02-15: 100 mL via INTRAVENOUS

## 2022-02-19 NOTE — Progress Notes (Deleted)
[facility] OFFICE PROGRESS NOTE  [name], MD [place] [address]  DIAGNOSIS:  Stage IV (T1c, N1, M1 C) non-small cell lung cancer, adenosquamous carcinoma presented with left upper lobe lung nodule in addition to left hilar adenopathy and bone metastasis diagnosed January 2023.   Molecular studies by foundation 1 showed positive KRAS G12C mutation   PD-L1 expression 10%  PRIOR THERAPY: 1) Palliative radiotherapy to the T12 destructive pathologic fracture under the care of Dr. Tammi Klippel completed on May 10, 2021. 2) Systemic chemotherapy with carboplatin for AUC of 5, Alimta 500 Mg/M2 and Keytruda 200 Mg IV every 3 weeks. Last dose on 08/08/21. Status post 3 cycles. Discontinued due to disease progression.   CURRENT THERAPY:  Krazati p.o. daily.  First dose Sep 07, 2021.  Status post 5 months of treatment.   INTERVAL HISTORY: Christopher Dawson 66 y.o. male returns  to the clinic for a follow-up visit.  The patient's mobility is challenging since he is unable to ambulate due to weakness in the lower extremities secondary to thoracic and lumbar metastatic lesions with pathological fractures and epidural extension.  The patient is still in a skilled nursing facility and comes to appointments via stretcher. The patient really wants to return home but it is very unlikely he would be able to take care of himself and does not have good family support.   The patient was last seen on 01/17/2022. [name] Since last being seen the patient denies any changes in his health. He has been having itching without rash for several weeks. He was given atarax without improvement. We prescribed a medrol dosepak without improvement. Unclear if his SNF has been using different lotions, soaps, or detergents. He has very dry skin and was advised to use lotion on a regular basis. He denies any fever, chills, or night sweats.  He drinks 1 protein shake per day.  Denies any  chest pain or hemoptysis. He does not exert himself so he denies DOE. He developed a cough after covid-19. He reports it is mild and he does not need to take anything for it. Denies any nausea, vomiting, diarrhea. He has some baseline constipation for which he takes a laxative and stool softeners.  He recently had a restaging CT scan performed. He is here for evaluation and to review his scan results.     MEDICAL HISTORY: Past Medical History:  Diagnosis Date   Anxiety    Depression    Lumbar radiculopathy, chronic 04/24/2006   Marfan syndrome    Neurosis, depressive    nscl ca with bone mets 04/2021   Shortening, leg, congenital     ALLERGIES:  is allergic to codeine.  MEDICATIONS:  Current Outpatient Medications  Medication Sig Dispense Refill   acetaminophen (TYLENOL) 325 MG tablet Take 2 tablets (650 mg total) by mouth every 6 (six) hours as needed for mild pain (or Fever >/= 101). 30 tablet 0   adagrasib (KRAZATI) 200 MG tablet Take 600 mg by mouth in the morning and at bedtime. 180 tablet 3   Amino Acids-Protein Hydrolys (PRO-STAT AWC) LIQD Take 30 mLs by mouth daily. Take 32m by mouth daily     Ascorbic Acid (VITAMIN C) 500 MG CAPS Take 1 tablet by mouth daily.     busPIRone (BUSPAR) 7.5 MG tablet Take 7.5 mg by mouth daily.     feeding supplement (ENSURE ENLIVE / ENSURE PLUS) LIQD Take 237 mLs by mouth 3 (three) times daily  between meals. 237 mL 12   fentaNYL (DURAGESIC) 12 MCG/HR Place 1 patch onto the skin every 3 (three) days.     ferrous sulfate 325 (65 FE) MG tablet Take 325 mg by mouth 2 (two) times daily.     folic acid (FOLVITE) 1 MG tablet Take 1 tablet (1 mg total) by mouth daily. 30 tablet 4   gabapentin (NEURONTIN) 100 MG capsule Take 1 capsule (100 mg total) by mouth 3 (three) times daily. 90 capsule 0   hydrocortisone cream 1 % Apply 1 Application topically 2 (two) times daily. To back twice daily for 1 week for itch.     hydrOXYzine (ATARAX) 25 MG tablet Take 25  mg by mouth 3 (three) times daily. Was Q 6 hrs prn itching, changed 11/15/21 to TID to gain control of itching     lactulose (CHRONULAC) 10 GM/15ML solution Take 45 mLs (30 g total) by mouth 2 (two) times daily. 236 mL 0   methylPREDNISolone (MEDROL DOSEPAK) 4 MG TBPK tablet Use as instructed 21 tablet 0   Multiple Vitamin (MULTIVITAMIN WITH MINERALS) TABS tablet Take 1 tablet by mouth daily. 30 tablet 0   Multiple Vitamins-Minerals (DECUBI-VITE) CAPS Take by mouth.     ondansetron (ZOFRAN) 4 MG tablet Take 1 tablet (4 mg total) by mouth every 6 (six) hours as needed for nausea. 20 tablet 0   oxycodone (OXY-IR) 5 MG capsule Take 5 mg by mouth every 6 (six) hours as needed for pain (breakthrough pain). Take 1 tab by mouth every six hours for pain.     oxyCODONE (OXYCONTIN) 15 mg 12 hr tablet Take 15 mg by mouth every 12 (twelve) hours.     polyethylene glycol (MIRALAX / GLYCOLAX) 17 g packet Take 17 g by mouth daily.     prochlorperazine (COMPAZINE) 10 MG tablet Take 1 tablet (10 mg total) by mouth every 6 (six) hours as needed for nausea or vomiting. 30 tablet 0   senna-docusate (SENOKOT-S) 8.6-50 MG tablet Take 1 tablet by mouth 2 (two) times daily. 30 tablet 0   tamsulosin (FLOMAX) 0.4 MG CAPS capsule Take 1 capsule (0.4 mg total) by mouth daily after supper. 30 capsule 0   thiamine 100 MG tablet Take 1 tablet (100 mg total) by mouth daily. 30 tablet 0   No current facility-administered medications for this visit.    SURGICAL HISTORY:  Past Surgical History:  Procedure Laterality Date   LEG SURGERY      REVIEW OF SYSTEMS:   Review of Systems  Constitutional: Negative for appetite change, chills, fatigue, fever and unexpected weight change.  HENT:   Negative for mouth sores, nosebleeds, sore throat and trouble swallowing.   Eyes: Negative for eye problems and icterus.  Respiratory: Negative for cough, hemoptysis, shortness of breath and wheezing.   Cardiovascular: Negative for chest pain  and leg swelling.  Gastrointestinal: Negative for abdominal pain, constipation, diarrhea, nausea and vomiting.  Genitourinary: Negative for bladder incontinence, difficulty urinating, dysuria, frequency and hematuria.   Musculoskeletal: Negative for back pain, gait problem, neck pain and neck stiffness.  Skin: Negative for itching and rash.  Neurological: Negative for dizziness, extremity weakness, gait problem, headaches, light-headedness and seizures.  Hematological: Negative for adenopathy. Does not bruise/bleed easily.  Psychiatric/Behavioral: Negative for confusion, depression and sleep disturbance. The patient is not nervous/anxious.     PHYSICAL EXAMINATION:  There were no vitals taken for this visit.  ECOG PERFORMANCE STATUS: {CHL ONC ECOG [id number]  Physical Exam  Constitutional:  Oriented to person, place, and time and well-developed, well-nourished, and in no distress. No distress.  HENT:  Head: Normocephalic and atraumatic.  Mouth/Throat: Oropharynx is clear and moist. No oropharyngeal exudate.  Eyes: Conjunctivae are normal. Right eye exhibits no discharge. Left eye exhibits no discharge. No scleral icterus.  Neck: Normal range of motion. Neck supple.  Cardiovascular: Normal rate, regular rhythm, normal heart sounds and intact distal pulses.   Pulmonary/Chest: Effort normal and breath sounds normal. No respiratory distress. No wheezes. No rales.  Abdominal: Soft. Bowel sounds are normal. Exhibits no distension and no mass. There is no tenderness.  Musculoskeletal: Normal range of motion. Exhibits no edema.  Lymphadenopathy:    No cervical adenopathy.  Neurological: Alert and oriented to person, place, and time. Exhibits normal muscle tone. Gait normal. Coordination normal.  Skin: Skin is warm and dry. No rash noted. Not diaphoretic. No erythema. No pallor.  Psychiatric: Mood, memory and judgment normal.  Vitals reviewed.  LABORATORY DATA: Lab Results  Component  Value Date   WBC 8.2 01/17/2022   HGB 10.3 (L) 01/17/2022   HCT 32.7 (L) 01/17/2022   MCV 84.7 01/17/2022   PLT 354 01/17/2022      Chemistry      Component Value Date/Time   NA 139 01/17/2022 1122   NA 146 (H) 07/04/2018 1642   K 3.5 01/17/2022 1122   CL 106 01/17/2022 1122   CO2 32 01/17/2022 1122   BUN 13 01/17/2022 1122   BUN 13 07/04/2018 1642   CREATININE 0.73 01/17/2022 1122   CREATININE 0.95 07/04/2012 1534      Component Value Date/Time   CALCIUM 8.7 (L) 01/17/2022 1122   ALKPHOS 52 01/17/2022 1122   AST 13 (L) 01/17/2022 1122   ALT 9 01/17/2022 1122   BILITOT 0.3 01/17/2022 1122       RADIOGRAPHIC STUDIES:  CT Chest W Contrast  Result Date: 02/17/2022 CLINICAL DATA:  Non-small cell lung cancer. Bone metastasis. Prior chemo therapy immunotherapy discontinued due to disease progression. Oral chemotherapy ongoing. EXAM: CT CHEST, ABDOMEN, AND PELVIS WITH CONTRAST TECHNIQUE: Multidetector CT imaging of the chest, abdomen and pelvis was performed following the standard protocol during bolus administration of intravenous contrast. RADIATION DOSE REDUCTION: This exam was performed according to the departmental dose-optimization program which includes automated exposure control, adjustment of the mA and/or kV according to patient size and/or use of iterative reconstruction technique. CONTRAST:  167m OMNIPAQUE IOHEXOL 300 MG/ML  SOLN COMPARISON:  None Available. FINDINGS: CT CHEST FINDINGS Cardiovascular: No significant vascular findings. Normal heart size. No pericardial effusion. Mediastinum/Nodes: Subcentimeter paratracheal lymph nodes unchanged from prior. No lymphadenopathy. RIGHT hilar lymph node measuring 9 mm unchanged (image 32/2. Similar LEFT hilar node measuring 8 mm not significantly changed. No supraclavicular or axillary adenopathy. Trachea and esophagus normal. Lungs/Pleura: No suspicious nodularity. Band of pleural-parenchymal thickening in the RIGHT lower lobe  is similar. Subpleural reticulation consistent chronic lung disease unchanged. Musculoskeletal: Sclerotic vertebral body at T5 again noted unchanged. Lytic lesion at T9 measuring 15 mm is unchanged. Chronic compression fracture 18 pathologic at T12 unchanged. No new sclerotic lesions lytic lesions pathologic fractures. CT ABDOMEN AND PELVIS FINDINGS Hepatobiliary: No focal hepatic lesion. No biliary ductal dilatation. Gallbladder is normal. Common bile duct is normal. Pancreas: Fatty replacement of the pancreas.  No suspicious lesion. Spleen: Normal spleen Adrenals/urinary tract: New soft tissue nodule adjacent to the RIGHT adrenal gland measures 2.7 x 2.8 cm (image 65/2). Multiplanar evaluation favors adrenal gland origin. LEFT adrenal gland  normal.  Kidneys, ureters and bladder normal. Stomach/Bowel: Stomach, small bowel, appendix, and cecum are normal. Large stool ball in the rectum. Rectum distended to 10 cm in diameter. No obstructing lesion identified. Vascular/Lymphatic: Abdominal aorta is normal caliber. There is no retroperitoneal or periportal lymphadenopathy. No pelvic lymphadenopathy. Reproductive: Unremarkable Other: No free fluid. Musculoskeletal: No suspicious lesion in the pelvis or lumbar spine. Thoracic metastasis described in chest IMPRESSION: Chest Impression: 1. No evidence of lung cancer recurrence within the pulmonary parenchyma. 2. Stable small mediastinal hilar lymph nodes. 3. Stable skeletal metastasis in the thoracic spine Abdomen / Pelvis Impression: 1. 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 2. Large stool ball in the rectum. These results will be called to the ordering clinician or representative by the Radiologist Assistant, and communication documented in the PACS or [employer]. Electronically Signed   By: Suzy Bouchard M.D.   On: 02/17/2022 08:33   CT Abdomen Pelvis W Contrast  Result Date: 02/17/2022 CLINICAL DATA:  Non-small cell lung cancer. Bone  metastasis. Prior chemo therapy immunotherapy discontinued due to disease progression. Oral chemotherapy ongoing. EXAM: CT CHEST, ABDOMEN, AND PELVIS WITH CONTRAST TECHNIQUE: Multidetector CT imaging of the chest, abdomen and pelvis was performed following the standard protocol during bolus administration of intravenous contrast. RADIATION DOSE REDUCTION: This exam was performed according to the departmental dose-optimization program which includes automated exposure control, adjustment of the mA and/or kV according to patient size and/or use of iterative reconstruction technique. CONTRAST:  169m OMNIPAQUE IOHEXOL 300 MG/ML  SOLN COMPARISON:  None Available. FINDINGS: CT CHEST FINDINGS Cardiovascular: No significant vascular findings. Normal heart size. No pericardial effusion. Mediastinum/Nodes: Subcentimeter paratracheal lymph nodes unchanged from prior. No lymphadenopathy. RIGHT hilar lymph node measuring 9 mm unchanged (image 32/2. Similar LEFT hilar node measuring 8 mm not significantly changed. No supraclavicular or axillary adenopathy. Trachea and esophagus normal. Lungs/Pleura: No suspicious nodularity. Band of pleural-parenchymal thickening in the RIGHT lower lobe is similar. Subpleural reticulation consistent chronic lung disease unchanged. Musculoskeletal: Sclerotic vertebral body at T5 again noted unchanged. Lytic lesion at T9 measuring 15 mm is unchanged. Chronic compression fracture 18 pathologic at T12 unchanged. No new sclerotic lesions lytic lesions pathologic fractures. CT ABDOMEN AND PELVIS FINDINGS Hepatobiliary: No focal hepatic lesion. No biliary ductal dilatation. Gallbladder is normal. Common bile duct is normal. Pancreas: Fatty replacement of the pancreas.  No suspicious lesion. Spleen: Normal spleen Adrenals/urinary tract: New soft tissue nodule adjacent to the RIGHT adrenal gland measures 2.7 x 2.8 cm (image 65/2). Multiplanar evaluation favors adrenal gland origin. LEFT adrenal gland  normal.  Kidneys, ureters and bladder normal. Stomach/Bowel: Stomach, small bowel, appendix, and cecum are normal. Large stool ball in the rectum. Rectum distended to 10 cm in diameter. No obstructing lesion identified. Vascular/Lymphatic: Abdominal aorta is normal caliber. There is no retroperitoneal or periportal lymphadenopathy. No pelvic lymphadenopathy. Reproductive: Unremarkable Other: No free fluid. Musculoskeletal: No suspicious lesion in the pelvis or lumbar spine. Thoracic metastasis described in chest IMPRESSION: Chest Impression: 1. No evidence of lung cancer recurrence within the pulmonary parenchyma. 2. Stable small mediastinal hilar lymph nodes. 3. Stable skeletal metastasis in the thoracic spine Abdomen / Pelvis Impression: 1. 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 2. Large stool ball in the rectum. These results will be called to the ordering clinician or representative by the Radiologist Assistant, and communication documented in the PACS or [employer] Electronically Signed   By: SSuzy BouchardM.D.   On: 02/17/2022 08:33  ASSESSMENT/PLAN:  This is a very pleasant 66 year old Caucasian male with stage IV non-small cell lung cancer, adenosquamous carcinoma.  The patient presented with a left upper lobe spiculated nodule with peripheral stranding and osseous metastatic disease, some of which involves epidural extension.  The patient was diagnosed in January 2023.  The patient's molecular studies show he is positive for K-ras G12 C mutation which may use in the second line setting. His PDL1 expression ins negative.    The patient completed palliative radiation to the T12 lesion under the care of Dr. Tammi Klippel on 05/10/21.    He initially started palliative systemic chemotherapy with carboplatin for AUC of 5, Alimta 500 Mg/M2 and Keytruda 200 Mg IV every 3 weeks.  First cycle June 30, 2021.  Status post 3 cycles.  The patient tolerated the second cycle of his treatment  fairly well except for fatigue. Unfortunately, the patient had been in such a disease progression with progression of the left upper lobe lung lesion, left hilar and subcarinal adenopathy.  He also had new sclerotic lesion at T1.   Therefore, the patient started targeted treatment with [place].  He started this in May 2023.  He has been on this for approximately 5 months.  He is tolerating this well without any concerning adverse side effects.    Patient recently had a restaging CT scan performed.  Dr. Julien Nordmann personally and independently reviewed the scan and discussed the results with the patient today.  The scan showed no evidence of disease progression except for new adrenal lesion.  Dr. Julien Nordmann recommends that we refer the patient to Dr. Tammi Klippel radiation oncology for consideration of palliative radiation to the spot.  In the meantime, Dr. Julien Nordmann recommends that he continue on his current targeted treatment at the same dose.  We will see him back for follow-up visit in approximately ***weeks for evaluation and repeat blood work.  The patient was advised to call immediately if she has any concerning symptoms in the interval. The patient voices understanding of current disease status and treatment options and is in agreement with the current care plan. All questions were answered. The patient knows to call the clinic with any problems, questions or concerns. We can certainly see the patient much sooner if necessary          No orders of the defined types were placed in this encounter.    I spent {CHL ONC TIME VISIT - NIOEV:[id number]} counseling the patient face to face. The total time spent in the appointment was {CHL ONC TIME VISIT - EXHBZ:[id number]}.  Disaya Walt L Samie Barclift, PA-C 02/19/22

## 2022-02-20 ENCOUNTER — Other Ambulatory Visit (HOSPITAL_COMMUNITY): Payer: Self-pay

## 2022-02-21 ENCOUNTER — Inpatient Hospital Stay: Payer: Medicare Other | Admitting: Physician Assistant

## 2022-02-22 NOTE — Progress Notes (Signed)
[facility] OFFICE PROGRESS NOTE  [name], MD [place] [address]  DIAGNOSIS: Stage IV (T1c, N1, M1 C) non-small cell lung cancer, adenosquamous carcinoma presented with left upper lobe lung nodule in addition to left hilar adenopathy and bone metastasis diagnosed January 2023.   Molecular studies by foundation 1 showed positive KRAS G12C mutation   PD-L1 expression 10%   PRIOR THERAPY: 1) Palliative radiotherapy to the T12 destructive pathologic fracture under the care of Dr. Tammi Klippel completed on May 10, 2021. 2) Systemic chemotherapy with carboplatin for AUC of 5, Alimta 500 Mg/M2 and Keytruda 200 Mg IV every 3 weeks. Last dose on 08/08/21. Status post 3 cycles. Discontinued due to disease progression.   CURRENT THERAPY: Krazati p.o. daily.  First dose Sep 07, 2021.  Status post 5.5 months of treatment.   INTERVAL HISTORY: Christopher Dawson 66 y.o. male returns to the clinic today for a follow-up visit.  The patient is currently a resident of a skilled nursing facility.  The patient is not ambulatory and he comes to his appointments at the clinic on a stretcher.  The patient is unable to ambulate due to weakness in his lower extremity secondary to thoracic and lumbar metastatic lesions with pathological and epidural extension.  The patient frequently expresses desire to return home although he has poor family support and of course is not ambulatory.   The patient was last seen in the clinic on 01/17/22.  The patient is currently undergoing targeted treatment with [place].  He has been tolerating this well without any concerning adverse side effects.  Today the patient denies any changes in his health except poor appetite, however, he believes this may be due to the unappetizing food at the SNF. He is not intrested in a medication to help with his appetite. Denies any fever, chills, or night sweats.  He drinks approximately 3 "milk shakes" a  day that his facility provides, which he thinks may be protein drinks. Denies any chest pain or hemoptysis.  He has not had dyspnea, cough.  Denies any nausea, vomiting, or diarrhea.  He reports baseline constipation for which he takes stool softeners and medicine provided by his facility. His last bowel movement was yesterday. His scan from last week showed some significant constipation.     The patient recently had a restaging CT scan performed.  He is here today for evaluation and to review his scan results.   MEDICAL HISTORY: Past Medical History:  Diagnosis Date   Anxiety    Depression    Lumbar radiculopathy, chronic 04/24/2006   Marfan syndrome    Neurosis, depressive    nscl ca with bone mets 04/2021   Shortening, leg, congenital     ALLERGIES:  is allergic to codeine.  MEDICATIONS:  Current Outpatient Medications  Medication Sig Dispense Refill   acetaminophen (TYLENOL) 325 MG tablet Take 2 tablets (650 mg total) by mouth every 6 (six) hours as needed for mild pain (or Fever >/= 101). 30 tablet 0   adagrasib (KRAZATI) 200 MG tablet Take 600 mg by mouth in the morning and at bedtime. 180 tablet 3   Amino Acids-Protein Hydrolys (PRO-STAT AWC) LIQD Take 30 mLs by mouth daily. Take 64m by mouth daily     Ascorbic Acid (VITAMIN C) 500 MG CAPS Take 1 tablet by mouth daily.     busPIRone (BUSPAR) 7.5 MG tablet Take 7.5 mg by mouth daily.     feeding supplement (ENSURE ENLIVE /  ENSURE PLUS) LIQD Take 237 mLs by mouth 3 (three) times daily between meals. 237 mL 12   fentaNYL (DURAGESIC) 12 MCG/HR Place 1 patch onto the skin every 3 (three) days.     ferrous sulfate 325 (65 FE) MG tablet Take 325 mg by mouth 2 (two) times daily.     folic acid (FOLVITE) 1 MG tablet Take 1 tablet (1 mg total) by mouth daily. 30 tablet 4   gabapentin (NEURONTIN) 100 MG capsule Take 1 capsule (100 mg total) by mouth 3 (three) times daily. 90 capsule 0   hydrocortisone cream 1 % Apply 1 Application  topically 2 (two) times daily. To back twice daily for 1 week for itch.     hydrOXYzine (ATARAX) 25 MG tablet Take 25 mg by mouth 3 (three) times daily. Was Q 6 hrs prn itching, changed 11/15/21 to TID to gain control of itching     lactulose (CHRONULAC) 10 GM/15ML solution Take 45 mLs (30 g total) by mouth 2 (two) times daily. 236 mL 0   methylPREDNISolone (MEDROL DOSEPAK) 4 MG TBPK tablet Use as instructed 21 tablet 0   Multiple Vitamin (MULTIVITAMIN WITH MINERALS) TABS tablet Take 1 tablet by mouth daily. 30 tablet 0   Multiple Vitamins-Minerals (DECUBI-VITE) CAPS Take by mouth.     ondansetron (ZOFRAN) 4 MG tablet Take 1 tablet (4 mg total) by mouth every 6 (six) hours as needed for nausea. 20 tablet 0   oxycodone (OXY-IR) 5 MG capsule Take 5 mg by mouth every 6 (six) hours as needed for pain (breakthrough pain). Take 1 tab by mouth every six hours for pain.     oxyCODONE (OXYCONTIN) 15 mg 12 hr tablet Take 15 mg by mouth every 12 (twelve) hours.     polyethylene glycol (MIRALAX / GLYCOLAX) 17 g packet Take 17 g by mouth daily.     prochlorperazine (COMPAZINE) 10 MG tablet Take 1 tablet (10 mg total) by mouth every 6 (six) hours as needed for nausea or vomiting. 30 tablet 0   senna-docusate (SENOKOT-S) 8.6-50 MG tablet Take 1 tablet by mouth 2 (two) times daily. 30 tablet 0   tamsulosin (FLOMAX) 0.4 MG CAPS capsule Take 1 capsule (0.4 mg total) by mouth daily after supper. 30 capsule 0   thiamine 100 MG tablet Take 1 tablet (100 mg total) by mouth daily. 30 tablet 0   No current facility-administered medications for this visit.    SURGICAL HISTORY:  Past Surgical History:  Procedure Laterality Date   LEG SURGERY      REVIEW OF SYSTEMS:   Review of Systems  Constitutional: Negative for appetite change, chills, fatigue, fever and unexpected weight change.  HENT: Negative for mouth sores, nosebleeds, sore throat and trouble swallowing.   Eyes: Negative for eye problems and icterus.   Respiratory: Negative for cough, hemoptysis, shortness of breath and wheezing.   Cardiovascular: Negative for chest pain and leg swelling.  Gastrointestinal: Negative for abdominal pain, diarrhea, nausea and vomiting. Constipation, lack of appetite Genitourinary: Negative for bladder incontinence, difficulty urinating, dysuria, frequency and hematuria.   Musculoskeletal: Negative for back pain, neck pain and neck stiffness. Arrives on stretcher  Skin: Negative for itching and rash.  Neurological: Negative for dizziness, extremity weakness, headaches, light-headedness and seizures.  Hematological: Negative for adenopathy. Does not bruise/bleed easily.  Psychiatric/Behavioral: Negative for confusion, depression and sleep disturbance. The patient is not nervous/anxious.     PHYSICAL EXAMINATION:  Blood pressure 106/66, pulse 79, temperature 98 F (36.7 C),  resp. rate 18, SpO2 95 %.  ECOG PERFORMANCE STATUS: 4 - Bedbound  Physical Exam  Constitutional: Oriented to person, place, and time and well-developed, well-nourished, and in no distress. No distress.  HENT:  Head: Normocephalic and atraumatic.  Mouth/Throat: Oropharynx is clear and moist. No oropharyngeal exudate.  Eyes: Conjunctivae are normal. Right eye exhibits no discharge. Left eye exhibits no discharge. No scleral icterus.  Neck: Normal range of motion. Neck supple.  Cardiovascular:  heart sounds Distant  Pulmonary/Chest: Effort normal and breath sounds normal. No respiratory distress. No wheezes. No rales.  Abdominal: Exhibits no distension  Musculoskeletal: Exhibits no edema. On stretcher  Lymphadenopathy:    No cervical adenopathy observed.  Neurological: Alert and oriented to person, place, and time.  Skin: Skin is warm and dry. No rash noted. Not diaphoretic. No erythema. No pallor.  Neurological: Alert and oriented to person, place, and time. Exhibits muscle wasting. Examined on the stretcher.   Psychiatric: Mood,  memory and judgment normal.  Vitals reviewed.  LABORATORY DATA: Lab Results  Component Value Date   WBC 8.2 01/17/2022   HGB 10.3 (L) 01/17/2022   HCT 32.7 (L) 01/17/2022   MCV 84.7 01/17/2022   PLT 354 01/17/2022      Chemistry      Component Value Date/Time   NA 139 01/17/2022 1122   NA 146 (H) 07/04/2018 1642   K 3.5 01/17/2022 1122   CL 106 01/17/2022 1122   CO2 32 01/17/2022 1122   BUN 13 01/17/2022 1122   BUN 13 07/04/2018 1642   CREATININE 0.73 01/17/2022 1122   CREATININE 0.95 07/04/2012 1534      Component Value Date/Time   CALCIUM 8.7 (L) 01/17/2022 1122   ALKPHOS 52 01/17/2022 1122   AST 13 (L) 01/17/2022 1122   ALT 9 01/17/2022 1122   BILITOT 0.3 01/17/2022 1122       RADIOGRAPHIC STUDIES:  CT Chest W Contrast  Result Date: 02/17/2022 CLINICAL DATA:  Non-small cell lung cancer. Bone metastasis. Prior chemo therapy immunotherapy discontinued due to disease progression. Oral chemotherapy ongoing. EXAM: CT CHEST, ABDOMEN, AND PELVIS WITH CONTRAST TECHNIQUE: Multidetector CT imaging of the chest, abdomen and pelvis was performed following the standard protocol during bolus administration of intravenous contrast. RADIATION DOSE REDUCTION: This exam was performed according to the departmental dose-optimization program which includes automated exposure control, adjustment of the mA and/or kV according to patient size and/or use of iterative reconstruction technique. CONTRAST:  180m OMNIPAQUE IOHEXOL 300 MG/ML  SOLN COMPARISON:  None Available. FINDINGS: CT CHEST FINDINGS Cardiovascular: No significant vascular findings. Normal heart size. No pericardial effusion. Mediastinum/Nodes: Subcentimeter paratracheal lymph nodes unchanged from prior. No lymphadenopathy. RIGHT hilar lymph node measuring 9 mm unchanged (image 32/2. Similar LEFT hilar node measuring 8 mm not significantly changed. No supraclavicular or axillary adenopathy. Trachea and esophagus normal.  Lungs/Pleura: No suspicious nodularity. Band of pleural-parenchymal thickening in the RIGHT lower lobe is similar. Subpleural reticulation consistent chronic lung disease unchanged. Musculoskeletal: Sclerotic vertebral body at T5 again noted unchanged. Lytic lesion at T9 measuring 15 mm is unchanged. Chronic compression fracture 18 pathologic at T12 unchanged. No new sclerotic lesions lytic lesions pathologic fractures. CT ABDOMEN AND PELVIS FINDINGS Hepatobiliary: No focal hepatic lesion. No biliary ductal dilatation. Gallbladder is normal. Common bile duct is normal. Pancreas: Fatty replacement of the pancreas.  No suspicious lesion. Spleen: Normal spleen Adrenals/urinary tract: New soft tissue nodule adjacent to the RIGHT adrenal gland measures 2.7 x 2.8 cm (image 65/2). Multiplanar evaluation  favors adrenal gland origin. LEFT adrenal gland normal.  Kidneys, ureters and bladder normal. Stomach/Bowel: Stomach, small bowel, appendix, and cecum are normal. Large stool ball in the rectum. Rectum distended to 10 cm in diameter. No obstructing lesion identified. Vascular/Lymphatic: Abdominal aorta is normal caliber. There is no retroperitoneal or periportal lymphadenopathy. No pelvic lymphadenopathy. Reproductive: Unremarkable Other: No free fluid. Musculoskeletal: No suspicious lesion in the pelvis or lumbar spine. Thoracic metastasis described in chest IMPRESSION: Chest Impression: 1. No evidence of lung cancer recurrence within the pulmonary parenchyma. 2. Stable small mediastinal hilar lymph nodes. 3. Stable skeletal metastasis in the thoracic spine Abdomen / Pelvis Impression: 1. 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 2. Large stool ball in the rectum. These results will be called to the ordering clinician or representative by the Radiologist Assistant, and communication documented in the PACS or [employer]. Electronically Signed   By: Suzy Bouchard M.D.   On: 02/17/2022 08:33   CT Abdomen  Pelvis W Contrast  Result Date: 02/17/2022 CLINICAL DATA:  Non-small cell lung cancer. Bone metastasis. Prior chemo therapy immunotherapy discontinued due to disease progression. Oral chemotherapy ongoing. EXAM: CT CHEST, ABDOMEN, AND PELVIS WITH CONTRAST TECHNIQUE: Multidetector CT imaging of the chest, abdomen and pelvis was performed following the standard protocol during bolus administration of intravenous contrast. RADIATION DOSE REDUCTION: This exam was performed according to the departmental dose-optimization program which includes automated exposure control, adjustment of the mA and/or kV according to patient size and/or use of iterative reconstruction technique. CONTRAST:  179m OMNIPAQUE IOHEXOL 300 MG/ML  SOLN COMPARISON:  None Available. FINDINGS: CT CHEST FINDINGS Cardiovascular: No significant vascular findings. Normal heart size. No pericardial effusion. Mediastinum/Nodes: Subcentimeter paratracheal lymph nodes unchanged from prior. No lymphadenopathy. RIGHT hilar lymph node measuring 9 mm unchanged (image 32/2. Similar LEFT hilar node measuring 8 mm not significantly changed. No supraclavicular or axillary adenopathy. Trachea and esophagus normal. Lungs/Pleura: No suspicious nodularity. Band of pleural-parenchymal thickening in the RIGHT lower lobe is similar. Subpleural reticulation consistent chronic lung disease unchanged. Musculoskeletal: Sclerotic vertebral body at T5 again noted unchanged. Lytic lesion at T9 measuring 15 mm is unchanged. Chronic compression fracture 18 pathologic at T12 unchanged. No new sclerotic lesions lytic lesions pathologic fractures. CT ABDOMEN AND PELVIS FINDINGS Hepatobiliary: No focal hepatic lesion. No biliary ductal dilatation. Gallbladder is normal. Common bile duct is normal. Pancreas: Fatty replacement of the pancreas.  No suspicious lesion. Spleen: Normal spleen Adrenals/urinary tract: New soft tissue nodule adjacent to the RIGHT adrenal gland measures 2.7 x  2.8 cm (image 65/2). Multiplanar evaluation favors adrenal gland origin. LEFT adrenal gland normal.  Kidneys, ureters and bladder normal. Stomach/Bowel: Stomach, small bowel, appendix, and cecum are normal. Large stool ball in the rectum. Rectum distended to 10 cm in diameter. No obstructing lesion identified. Vascular/Lymphatic: Abdominal aorta is normal caliber. There is no retroperitoneal or periportal lymphadenopathy. No pelvic lymphadenopathy. Reproductive: Unremarkable Other: No free fluid. Musculoskeletal: No suspicious lesion in the pelvis or lumbar spine. Thoracic metastasis described in chest IMPRESSION: Chest Impression: 1. No evidence of lung cancer recurrence within the pulmonary parenchyma. 2. Stable small mediastinal hilar lymph nodes. 3. Stable skeletal metastasis in the thoracic spine Abdomen / Pelvis Impression: 1. 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 2. Large stool ball in the rectum. These results will be called to the ordering clinician or representative by the Radiologist Assistant, and communication documented in the PACS or [employer] Electronically Signed   By: SHelane GuntherD.  On: 02/17/2022 08:33     ASSESSMENT/PLAN:  This is a very pleasant 66 year old Caucasian male with stage IV non-small cell lung cancer, adenosquamous carcinoma.  The patient presented with a left upper lobe spiculated nodule with peripheral stranding and osseous metastatic disease, some of which involves epidural extension.  The patient was diagnosed in January 2023.  The patient's molecular studies show he is positive for K-ras G12 C mutation which may use in the second line setting. His PDL1 expression ins negative.    The patient completed palliative radiation to the T12 lesion under the care of Dr. Tammi Klippel on 05/10/21.    He initially started palliative systemic chemotherapy with carboplatin for AUC of 5, Alimta 500 Mg/M2 and Keytruda 200 Mg IV every 3 weeks.  First cycle June 30, 2021.  Status post 3 cycles.  The patient tolerated the second cycle of his treatment fairly well except for fatigue. Unfortunately, the patient had been in such a disease progression with progression of the left upper lobe lung lesion, left hilar and subcarinal adenopathy.  He also had new sclerotic lesion at T1.  Therefore, the patient started targeted treatment with [place].  He started this in May 2023.  He has been on this for approximately 5.5 months.  He is tolerating this well without any concerning adverse side effects, he is having significant itching for the last several months.  Unclear if this is related.  Patient recently had a restaging CT scan performed.  Dr. Julien Nordmann personally independently reviewed the scan discussed results with the patient today.  The scan showed  new soft tissue nodule adjacent tot he right adrenal gland  measuring 2.8 cm and a large stool ball in the rectum.  Dr. Julien Nordmann recommends referring the patient to Dr. Tammi Klippel for consideration of radiation to the adrenal gland.   We will see him back for a for a follow up visit in 1 month for evaluation and repeat lab work.   After he left, it appears the patient no showed his lab appointment on 02/14/22.Therefore, I will monitor when his consultation is for radiation oncology and will add a lab appointment before that visit.   The patient mentions today that he would like to be discharged from the SNF.  Dr. Julien Nordmann discussed that we would not recommend that he be discharged unless that is what the skilled nursing facility recommends based on his evaluation by physical therapy.  It is unlikely that they recommend him for discharge as he is unable to ambulate or perform his ADLs.  The patient does not have good family support at home either.    The patient is not interested anything for his appetite. He thinks that his appetite is poor because the food is not appetizing.   The patient was advised to call immediately  if he has any concerning symptoms in the interval. The patient voices understanding of current disease status and treatment options and is in agreement with the current care plan. All questions were answered. The patient knows to call the clinic with any problems, questions or concerns. We can certainly see the patient much sooner if necessary   Orders Placed This Encounter  Procedures   CBC with Differential ([facility] Only)    Standing Status:   Future    Standing Expiration Date:   02/24/2023   CMP ([facility] only)    Standing Status:   Future    Standing Expiration Date:   02/24/2023   Ambulatory referral to  Radiation Oncology    Referral Priority:   Routine    Referral Type:   Consultation    Referral Reason:   Specialty Services Required    Requested Specialty:   Radiation Oncology    Number of Visits Requested:   [place], PA-C 02/23/22   ADDENDUM: Hematology/Oncology Attending: I had a face-to-face encounter with the patient.  I reviewed his records, lab, scan and recommended his care plan.  This is a very pleasant 66 years old white male with metastatic non-small cell lung cancer, adenocarcinoma with positive KRAS G12C mutation status post palliative radiotherapy to the T12 lesion under the care of Dr. Tammi Klippel.  The patient started palliative systemic chemotherapy initially with carboplatin, Alimta and Keytruda status post 3 cycles discontinued secondary to disease progression. He is currently undergoing second line treatment with Krazati (Adagrasib) 600 mg p.o. twice daily and tolerating it fairly well.  He is status post 5.5 months of treatment. He had repeat CT scan of the chest, abdomen and pelvis performed recently.  I personally and independently reviewed the scan images and discussed the result with the patient today. His scan showed no evidence for disease progression except for suspicious soft tissue mass in the porta hepatis area  concerning for metastatic disease. I recommended for the patient to continue his current treatment with [name] (Adagrasib) with the same dose. Regarding the soft tissue mass, we will refer the patient to Dr. Tammi Klippel for consideration of palliative radiotherapy to this area. The patient will come back for follow-up visit in 1 months for evaluation and repeat blood work. He was advised to call immediately if he has any other concerning symptoms in the interval. he total time spent in the appointment was 30 minutes. Disclaimer: This note was dictated with voice recognition software. Similar sounding words can inadvertently be transcribed and may be missed upon review.

## 2022-02-23 ENCOUNTER — Inpatient Hospital Stay: Payer: Medicare Other | Attending: Physician Assistant | Admitting: Physician Assistant

## 2022-02-23 ENCOUNTER — Ambulatory Visit: Payer: Medicare Other | Admitting: Physician Assistant

## 2022-02-23 ENCOUNTER — Telehealth: Payer: Self-pay | Admitting: Radiation Oncology

## 2022-02-23 VITALS — BP 106/66 | HR 79 | Temp 98.0°F | Resp 18

## 2022-02-23 DIAGNOSIS — C3412 Malignant neoplasm of upper lobe, left bronchus or lung: Secondary | ICD-10-CM | POA: Diagnosis present

## 2022-02-23 DIAGNOSIS — C7951 Secondary malignant neoplasm of bone: Secondary | ICD-10-CM | POA: Insufficient documentation

## 2022-02-23 DIAGNOSIS — C3492 Malignant neoplasm of unspecified part of left bronchus or lung: Secondary | ICD-10-CM | POA: Diagnosis not present

## 2022-02-23 DIAGNOSIS — K5900 Constipation, unspecified: Secondary | ICD-10-CM | POA: Insufficient documentation

## 2022-02-23 DIAGNOSIS — Z7952 Long term (current) use of systemic steroids: Secondary | ICD-10-CM | POA: Insufficient documentation

## 2022-02-23 DIAGNOSIS — Z923 Personal history of irradiation: Secondary | ICD-10-CM | POA: Insufficient documentation

## 2022-02-23 DIAGNOSIS — Z79899 Other long term (current) drug therapy: Secondary | ICD-10-CM | POA: Diagnosis not present

## 2022-02-23 NOTE — Telephone Encounter (Signed)
Called patient to schedule a consultation w. Dr. Tammi Klippel. No answer, LVM for a return call.

## 2022-02-24 ENCOUNTER — Telehealth: Payer: Self-pay | Admitting: Physician Assistant

## 2022-02-24 ENCOUNTER — Telehealth: Payer: Self-pay | Admitting: Radiation Oncology

## 2022-02-24 NOTE — Telephone Encounter (Signed)
I called the patient.  He was supposed to have a lab appointment on 10/24 which he did not show up for.  The patient's situation is complicated and he is not ambulatory and he is reliant on transportation through his skilled nursing facility.  I called the patient and let him know I would be monitoring for when he is next brought in for a radiation appointment and I will make a lab appointment approximately 30 minutes prior.  While on the phone, the patient became tearful.  He is not happy at his current skilled nursing facility.  Apparently his sister already sold his house, although it is highly unlikely that the patient would ever return home.  I encouraged the patient to discuss with his sister his wishes and see if it were possible for him to be transferred to another skilled nursing facility.  The patient briefly asked for clarification about hospice and whether he can continue undergoing his targeted treatment with hospice care. PT was not effective for him. I will reconnect with him next week once his radiation appointments are made. I will see if there is any progress with his living situation and may offer referral to spiritual support/counseling. Of note, the patient was previously undergoing PT but there was lack of progress.

## 2022-02-24 NOTE — Telephone Encounter (Signed)
Called patient to schedule a consultation w. Dr. Tammi Klippel. No answer, LVM for a  return call.

## 2022-02-27 ENCOUNTER — Other Ambulatory Visit (HOSPITAL_COMMUNITY): Payer: Self-pay

## 2022-02-27 NOTE — Progress Notes (Signed)
Thoracic Location of Tumor / Histology: Stage IV (T1c, N1, M1 C) non-small cell lung cancer, adenosquamous carcinoma presented with left upper lobe lung nodule in addition to left hilar adenopathy and bone metastasis diagnosed January 2023.    02/15/2022 Cassandra Heilingoetter, PA-C CT Abdomen Pelvis with Contrast  CLINICAL DATA:  Non-small cell lung cancer. Bone metastasis. Prior chemo therapy immunotherapy discontinued due to disease progression.  Oral chemotherapy ongoing.  FINDINGS: CT CHEST FINDINGS   Cardiovascular: No significant vascular findings. Normal heart size. No pericardial effusion.   Mediastinum/Nodes: Subcentimeter paratracheal lymph nodes unchanged from prior. No lymphadenopathy. RIGHT hilar lymph node measuring 9 mm unchanged (image 32/2. Similar LEFT hilar node measuring 8 mm not significantly changed.   No supraclavicular or axillary adenopathy. Trachea and esophagus normal.   Lungs/Pleura: No suspicious nodularity. Band of pleural-parenchymal thickening in the RIGHT lower lobe is similar. Subpleural reticulation consistent chronic lung disease unchanged.   Musculoskeletal: Sclerotic vertebral body at T5 again noted unchanged. Lytic lesion at T9 measuring 15 mm is unchanged. Chronic compression fracture 18 pathologic at T12 unchanged. No new sclerotic lesions lytic lesions pathologic fractures.   CT ABDOMEN AND PELVIS FINDINGS   Hepatobiliary: No focal hepatic lesion. No biliary ductal dilatation. Gallbladder is normal. Common bile duct is normal.   Pancreas: Fatty replacement of the pancreas.  No suspicious lesion.   Spleen: Normal spleen   Adrenals/urinary tract: New soft tissue nodule adjacent to the RIGHT adrenal gland measures 2.7 x 2.8 cm (image 65/2). Multiplanar evaluation favors adrenal gland origin.   LEFT adrenal gland normal.  Kidneys, ureters and bladder normal.   Stomach/Bowel: Stomach, small bowel, appendix, and cecum are  normal. Large stool ball in the rectum. Rectum distended to 10 cm in diameter. No obstructing lesion identified.   Vascular/Lymphatic: Abdominal aorta is normal caliber. There is no retroperitoneal or periportal lymphadenopathy. No pelvic lymphadenopathy.   Reproductive: Unremarkable   Other: No free fluid.   Musculoskeletal: No suspicious lesion in the pelvis or lumbar spine. Thoracic metastasis described in chest   IMPRESSION: Chest Impression: 1. No evidence of lung cancer recurrence within the pulmonary parenchyma. 2. Stable small mediastinal hilar lymph nodes. 3. Stable skeletal metastasis in the thoracic spine   Abdomen / Pelvis Impression: 1. 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 2. Large stool ball in the rectum.   11/16/2021 Dr. Julien Nordmann CT Abdomen Pelvis with Contrast CLINICAL DATA:  Non small cell lung cancer. Metastatic disease. Assess response to treatment. * Tracking Code: BO *  FINDINGS: CT CHEST FINDINGS   Cardiovascular: No significant vascular findings. Normal heart size. No pericardial effusion.   Mediastinum/Nodes: No axillary or supraclavicular adenopathy. No mediastinal or hilar adenopathy. No pericardial fluid. Esophagus normal.   Lungs/Pleura: Marked interval decrease in volume of LEFT upper lobe nodule currently measuring 12 mm by 12 mm compared to 41 mm 28 mm.   Resolution of bibasilar medial parenchymal lung consolidation seen on comparison exam. Mild atelectasis remains at the lung bases.   There is peripheral subpleural interstitial thickening and cystic change again noted. No new or suspicious pulmonary nodularity.   Musculoskeletal: Sclerosis of the vertebral body at T5 again noted. Lytic lesion at T9 again noted. Severe compression deformity at T12 again noted. No new metastatic lesion identified in the spine.   CT ABDOMEN AND PELVIS FINDINGS   Hepatobiliary: No focal hepatic lesion. No biliary ductal dilatation.  Gallbladder is normal. Common bile duct is normal.   Pancreas: Pancreas is normal. No ductal  dilatation. No pancreatic inflammation.   Spleen: Normal spleen   Adrenals/urinary tract: Adrenal glands and kidneys are normal. The ureters and bladder normal.   Stomach/Bowel: Stomach, small bowel, appendix, and cecum are normal. The colon and rectosigmoid colon are normal.   Vascular/Lymphatic: Abdominal aorta is normal caliber. There is no retroperitoneal or periportal lymphadenopathy. No pelvic lymphadenopathy.   Reproductive:   Other: No free fluid.   Musculoskeletal: Small round sclerotic lesion at L1 measuring 7 mm on image 67/2 is unchanged. No metastatic lesions identified pelvis.   IMPRESSION: Chest Impression: 1. Near complete resolution nodular process in the LEFT upper lobe. 2. Near complete resolution of bibasilar pneumonia. 3. No evidence of new or progressive metastatic disease in the thorax. 4. Stable lytic and sclerotic metastasis within the thoracic spine. 5. Stable pathologic compression fracture at T12.   Abdomen / Pelvis Impression: 1. No visceral metastasis or adenopathy in the abdomen pelvis. 2. Stable sclerotic skeletal metastasis.  Tobacco/Marijuana/Snuff/ETOH use: No  Past/Anticipated interventions by cardiothoracic surgery, if any: NA  Past/Anticipated interventions by medical oncology, if any:   02/23/2022 Cassandra Heilingoetter,  PA-C  ASSESSMENT/PLAN:  This is a very pleasant 66 year old Caucasian male with stage IV non-small cell lung cancer, adenosquamous carcinoma.  The patient presented with a left upper lobe spiculated nodule with peripheral stranding and osseous metastatic disease, some of which involves epidural extension.  The patient was diagnosed in January 2023.  The patient's molecular studies show he is positive for K-ras G12 C mutation which may use in the second line setting. His PDL1 expression ins negative.    The patient  completed palliative radiation to the T12 lesion under the care of Dr. Tammi Klippel on 05/10/21.    He initially started palliative systemic chemotherapy with carboplatin for AUC of 5, Alimta 500 Mg/M2 and Keytruda 200 Mg IV every 3 weeks.  First cycle June 30, 2021.  Status post 3 cycles.  The patient tolerated the second cycle of his treatment fairly well except for fatigue. Unfortunately, the patient had been in such a disease progression with progression of the left upper lobe lung lesion, left hilar and subcarinal adenopathy.  He also had new sclerotic lesion at T1.   Therefore, the patient started targeted treatment with [place].  He started this in May 2023.  He has been on this for approximately 5.5 months.  He is tolerating this well without any concerning adverse side effects, he is having significant itching for the last several months.  Unclear if this is related.   Patient recently had a restaging CT scan performed.  Dr. Julien Nordmann personally independently reviewed the scan discussed results with the patient today.  The scan showed  new soft tissue nodule adjacent tot he right adrenal gland  measuring 2.8 cm and a large stool ball in the rectum.   Dr. Julien Nordmann recommends referring the patient to Dr. Tammi Klippel for consideration of radiation to the adrenal gland.    We will see him back for a for a follow up visit in 1 month for evaluation and repeat lab work.    After he left, it appears the patient no showed his lab appointment on 02/14/22.Therefore, I will monitor when his consultation is for radiation oncology and will add a lab appointment before that visit.    The patient mentions today that he would like to be discharged from the SNF.  Dr. Julien Nordmann discussed that we would not recommend that he be discharged unless that is what the skilled nursing facility  recommends based on his evaluation by physical therapy.  It is unlikely that they recommend him for discharge as he is unable to ambulate or  perform his ADLs.  The patient does not have good family support at home either.    The patient is not interested anything for his appetite. He thinks that his appetite is poor because the food is not appetizing.    The patient was advised to call immediately if he has any concerning symptoms in the interval. The patient voices understanding of current disease status and treatment options and is in agreement with the current care plan. All questions were answered. The patient knows to call the clinic with any problems, questions or concerns. We can certainly see the patient much sooner if necessary  Signs/Symptoms Weight changes, if any: {:18581} Respiratory complaints, if any: {:18581} Hemoptysis, if any: {:18581} Pain issues, if any:  {:18581}  SAFETY ISSUES: Prior radiation? {:18581} Pacemaker/ICD? {:18581}  Possible current pregnancy?  Male Is the patient on methotrexate? No  Current Complaints / other details:

## 2022-02-28 ENCOUNTER — Ambulatory Visit
Admission: RE | Admit: 2022-02-28 | Discharge: 2022-02-28 | Disposition: A | Discharge status: Home / Self Care | Payer: Medicare Other | Source: Ambulatory Visit | Attending: Radiation Oncology | Admitting: Radiation Oncology

## 2022-02-28 DIAGNOSIS — E278 Other specified disorders of adrenal gland: Secondary | ICD-10-CM

## 2022-02-28 DIAGNOSIS — C3492 Malignant neoplasm of unspecified part of left bronchus or lung: Secondary | ICD-10-CM

## 2022-02-28 DIAGNOSIS — C7971 Secondary malignant neoplasm of right adrenal gland: Secondary | ICD-10-CM

## 2022-02-28 DIAGNOSIS — C7970 Secondary malignant neoplasm of unspecified adrenal gland: Secondary | ICD-10-CM | POA: Insufficient documentation

## 2022-02-28 DIAGNOSIS — C801 Malignant (primary) neoplasm, unspecified: Secondary | ICD-10-CM | POA: Insufficient documentation

## 2022-02-28 NOTE — Progress Notes (Signed)
Radiation Oncology         [phone] ________________________________  Outpatient Re-Consultation- visit conducted via telephone to spare the patient unnecessary potential exposure in the healthcare setting during the current COVID-19 pandemic.  The patient was notified in advance and gave permission to proceed with this visit format.   Name: Christopher Dawson MRN: [id number]  Date of Service: 02/28/2022 DOB: 1955/04/29  [id number], Mortimer Fries, MD  Curt Bears, MD   REFERRING PHYSICIAN: Curt Bears, MD  DIAGNOSIS: 66 y/o male with a new 2.8 cm metastatic lesion adjacent to the right adrenal gland secondary to metastatic NSCLC    ICD-10-CM   1. Adrenal mass ([place])  E27.8     2. Non-small cell carcinoma of left lung, stage 4 (HCC)  C34.92     3. 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 male seen at the request of Dr. Julien Nordmann. In summary, he presented to the ED on 04/23/21 with increasing back pain and lower extremity weakness bilaterally. He was found to have several lesions involving the thoracic spine. Staging CT C/A/P showed a 1.8 cm LUL nodule, a 2 cm lesion at T9, destructive lesion at T12 with epidural extension, a 1.5 cm indeterminate right adrenal mass, and two slightly prominent left hilar nodes. We met the patient in the hospital on 04/26/21. Biopsy of the T12 mass on 04/28/21 confirmed poorly differentiated non-small cell carcinoma. He was treated with palliative radiation to the painful metastases at T9 and T12, followed by 3 cycles of systemic chemotherapy with carboplatin, Keytruda, and alimta from 06/30/21 through 08/08/21. He was switched to oral Krazati on 09/07/21 due to disease progression.  He recently underwent restaging CT C/A/P on 02/15/22 showing a new 2.8 cm right adrenal gland metastasis, but fortunately, no evidence of lung cancer recurrence and stable small mediastinal hilar  lymph nodes and thoracic spine skeletal metastasis. Per discussion with Dr. Julien Nordmann on 02/23/22, they plan to continue oral Krazati, and he has been referred to [place] today for consideration of radiation therapy to the new solitary adrenal metastasis.  PREVIOUS RADIATION THERAPY: Yes  04/27/21 - 05/10/21: The painful bony metastases at T9 and T12 were treated to 30 Gy in 10 fractions.   PAST MEDICAL HISTORY:  Past Medical History:  Diagnosis Date   Anxiety    Depression    Lumbar radiculopathy, chronic 04/24/2006   Marfan syndrome    Neurosis, depressive    nscl ca with bone mets 04/2021   Shortening, leg, congenital       PAST SURGICAL HISTORY: Past Surgical History:  Procedure Laterality Date   LEG SURGERY      FAMILY HISTORY:  Family History  Problem Relation Age of Onset   Heart disease Mother     SOCIAL HISTORY:  Social History   Socioeconomic History   Marital status: Single    Spouse name: Not on file   Number of children: Not on file   Years of education: Not on file   Highest education level: Not on file  Occupational History   Not on file  Tobacco Use   Smoking status: Former    Types: Cigarettes    Quit date: 04/24/2021    Years since quitting: 0.8   Smokeless tobacco: Never  Vaping Use   Vaping Use: Never used  Substance and Sexual Activity   Alcohol use: Yes    Alcohol/week: 1.0 standard  drink of alcohol    Types: 1 Cans of beer per week    Comment: 3-4 beers daily   Drug use: No   Sexual activity: Not Currently  Other Topics Concern   Not on file  Social History Narrative   Not on file   Social Determinants of Health   Financial Resource Strain: Not on file  Food Insecurity: Not on file  Transportation Needs: Not on file  Physical Activity: Not on file  Stress: Not on file  Social Connections: Not on file  Intimate Partner Violence: Not on file    ALLERGIES: Codeine  MEDICATIONS:  Current Outpatient Medications  Medication Sig Dispense  Refill   hydrOXYzine (ATARAX) 50 MG tablet Take 50 mg by mouth 3 (three) times daily.     acetaminophen (TYLENOL) 325 MG tablet Take 2 tablets (650 mg total) by mouth every 6 (six) hours as needed for mild pain (or Fever >/= 101). 30 tablet 0   adagrasib (KRAZATI) 200 MG tablet Take 600 mg by mouth in the morning and at bedtime. 180 tablet 3   Amino Acids-Protein Hydrolys (PRO-STAT AWC) LIQD Take 30 mLs by mouth daily. Take 70m by mouth daily     busPIRone (BUSPAR) 7.5 MG tablet Take 7.5 mg by mouth daily.     feeding supplement (ENSURE ENLIVE / ENSURE PLUS) LIQD Take 237 mLs by mouth 3 (three) times daily between meals. 237 mL 12   fentaNYL (DURAGESIC) 12 MCG/HR Place 1 patch onto the skin every 3 (three) days.     ferrous sulfate 325 (65 FE) MG tablet Take 325 mg by mouth 2 (two) times daily.     folic acid (FOLVITE) 1 MG tablet Take 1 tablet (1 mg total) by mouth daily. 30 tablet 4   gabapentin (NEURONTIN) 100 MG capsule Take 1 capsule (100 mg total) by mouth 3 (three) times daily. 90 capsule 0   hydrocortisone cream 1 % Apply 1 Application topically 2 (two) times daily. To back twice daily for 1 week for itch.     lactulose (CHRONULAC) 10 GM/15ML solution Take 45 mLs (30 g total) by mouth 2 (two) times daily. 236 mL 0   methylPREDNISolone (MEDROL DOSEPAK) 4 MG TBPK tablet Use as instructed 21 tablet 0   Multiple Vitamin (MULTIVITAMIN WITH MINERALS) TABS tablet Take 1 tablet by mouth daily. 30 tablet 0   ondansetron (ZOFRAN) 4 MG tablet Take 1 tablet (4 mg total) by mouth every 6 (six) hours as needed for nausea. 20 tablet 0   oxycodone (OXY-IR) 5 MG capsule Take 5 mg by mouth every 6 (six) hours as needed for pain (breakthrough pain). Take 1 tab by mouth every six hours for pain.     oxyCODONE (OXYCONTIN) 15 mg 12 hr tablet Take 15 mg by mouth every 12 (twelve) hours.     polyethylene glycol (MIRALAX / GLYCOLAX) 17 g packet Take 17 g by mouth daily.     prochlorperazine (COMPAZINE) 10 MG  tablet Take 1 tablet (10 mg total) by mouth every 6 (six) hours as needed for nausea or vomiting. 30 tablet 0   senna-docusate (SENOKOT-S) 8.6-50 MG tablet Take 1 tablet by mouth 2 (two) times daily. 30 tablet 0   thiamine 100 MG tablet Take 1 tablet (100 mg total) by mouth daily. 30 tablet 0   No current facility-administered medications for this encounter.    REVIEW OF SYSTEMS:  On review of systems, the patient reports that he is doing fair overall. He  denies any chest pain, shortness of breath, cough, fevers, chills, or night sweats but has had unintended weight loss secondary to the bad food at the SNF he is currently residing at. He denies any bowel or bladder disturbances, and denies abdominal pain, nausea or vomiting. He reports persistent low back pain, that prevents him from being able to sit upright or ambulate. He is completely bedridden at this point despite having worked with PT previously. A complete review of systems is obtained and is otherwise negative.    PHYSICAL EXAM:  Wt Readings from Last 3 Encounters:  10/19/21 158 lb (71.7 kg)  09/02/21 156 lb (70.8 kg)  06/16/21 165 lb (74.8 kg)   Temp Readings from Last 3 Encounters:  02/23/22 98 F (36.7 C)  01/17/22 98.6 F (37 C) (Oral)  11/28/21 98.1 F (36.7 C) (Temporal)   BP Readings from Last 3 Encounters:  02/23/22 106/66  01/17/22 122/79  11/28/21 98/83   Pulse Readings from Last 3 Encounters:  02/23/22 79  01/17/22 92  11/28/21 92    /10  Unable to assess due to telephone follow-up visit format.   KPS = 40  100 - Normal; no complaints; no evidence of disease. 90   - Able to carry on normal activity; minor signs or symptoms of disease. 80   - Normal activity with effort; some signs or symptoms of disease. 98   - Cares for self; unable to carry on normal activity or to do active work. 60   - Requires occasional assistance, but is able to care for most of his personal needs. 50   - Requires considerable  assistance and frequent medical care. 8   - Disabled; requires special care and assistance. 75   - Severely disabled; hospital admission is indicated although death not imminent. 95   - Very sick; hospital admission necessary; active supportive treatment necessary. 10   - Moribund; fatal processes progressing rapidly. 0     - Dead  Karnofsky DA, Abelmann [place], Craver LS and Burchenal [facility] [phone]) The use of the nitrogen mustards in the palliative treatment of carcinoma: with particular reference to bronchogenic carcinoma Cancer 1 634-56  LABORATORY DATA:  Lab Results  Component Value Date   WBC 8.2 01/17/2022   HGB 10.3 (L) 01/17/2022   HCT 32.7 (L) 01/17/2022   MCV 84.7 01/17/2022   PLT 354 01/17/2022   Lab Results  Component Value Date   NA 139 01/17/2022   K 3.5 01/17/2022   CL 106 01/17/2022   CO2 32 01/17/2022   Lab Results  Component Value Date   ALT 9 01/17/2022   AST 13 (L) 01/17/2022   ALKPHOS 52 01/17/2022   BILITOT 0.3 01/17/2022     RADIOGRAPHY: CT Chest W Contrast  Result Date: 02/17/2022 CLINICAL DATA:  Non-small cell lung cancer. Bone metastasis. Prior chemo therapy immunotherapy discontinued due to disease progression. Oral chemotherapy ongoing. EXAM: CT CHEST, ABDOMEN, AND PELVIS WITH CONTRAST TECHNIQUE: Multidetector CT imaging of the chest, abdomen and pelvis was performed following the standard protocol during bolus administration of intravenous contrast. RADIATION DOSE REDUCTION: This exam was performed according to the departmental dose-optimization program which includes automated exposure control, adjustment of the mA and/or kV according to patient size and/or use of iterative reconstruction technique. CONTRAST:  178m OMNIPAQUE IOHEXOL 300 MG/ML  SOLN COMPARISON:  None Available. FINDINGS: CT CHEST FINDINGS Cardiovascular: No significant vascular findings. Normal heart size. No pericardial effusion. Mediastinum/Nodes: Subcentimeter paratracheal lymph nodes  unchanged from prior.  No lymphadenopathy. RIGHT hilar lymph node measuring 9 mm unchanged (image 32/2. Similar LEFT hilar node measuring 8 mm not significantly changed. No supraclavicular or axillary adenopathy. Trachea and esophagus normal. Lungs/Pleura: No suspicious nodularity. Band of pleural-parenchymal thickening in the RIGHT lower lobe is similar. Subpleural reticulation consistent chronic lung disease unchanged. Musculoskeletal: Sclerotic vertebral body at T5 again noted unchanged. Lytic lesion at T9 measuring 15 mm is unchanged. Chronic compression fracture 18 pathologic at T12 unchanged. No new sclerotic lesions lytic lesions pathologic fractures. CT ABDOMEN AND PELVIS FINDINGS Hepatobiliary: No focal hepatic lesion. No biliary ductal dilatation. Gallbladder is normal. Common bile duct is normal. Pancreas: Fatty replacement of the pancreas.  No suspicious lesion. Spleen: Normal spleen Adrenals/urinary tract: New soft tissue nodule adjacent to the RIGHT adrenal gland measures 2.7 x 2.8 cm (image 65/2). Multiplanar evaluation favors adrenal gland origin. LEFT adrenal gland normal.  Kidneys, ureters and bladder normal. Stomach/Bowel: Stomach, small bowel, appendix, and cecum are normal. Large stool ball in the rectum. Rectum distended to 10 cm in diameter. No obstructing lesion identified. Vascular/Lymphatic: Abdominal aorta is normal caliber. There is no retroperitoneal or periportal lymphadenopathy. No pelvic lymphadenopathy. Reproductive: Unremarkable Other: No free fluid. Musculoskeletal: No suspicious lesion in the pelvis or lumbar spine. Thoracic metastasis described in chest IMPRESSION: Chest Impression: 1. No evidence of lung cancer recurrence within the pulmonary parenchyma. 2. Stable small mediastinal hilar lymph nodes. 3. Stable skeletal metastasis in the thoracic spine Abdomen / Pelvis Impression: 1. 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 2. Large stool ball in the rectum. These  results will be called to the ordering clinician or representative by the Radiologist Assistant, and communication documented in the PACS or [employer]. Electronically Signed   By: Suzy Bouchard M.D.   On: 02/17/2022 08:33   CT Abdomen Pelvis W Contrast  Result Date: 02/17/2022 CLINICAL DATA:  Non-small cell lung cancer. Bone metastasis. Prior chemo therapy immunotherapy discontinued due to disease progression. Oral chemotherapy ongoing. EXAM: CT CHEST, ABDOMEN, AND PELVIS WITH CONTRAST TECHNIQUE: Multidetector CT imaging of the chest, abdomen and pelvis was performed following the standard protocol during bolus administration of intravenous contrast. RADIATION DOSE REDUCTION: This exam was performed according to the departmental dose-optimization program which includes automated exposure control, adjustment of the mA and/or kV according to patient size and/or use of iterative reconstruction technique. CONTRAST:  113m OMNIPAQUE IOHEXOL 300 MG/ML  SOLN COMPARISON:  None Available. FINDINGS: CT CHEST FINDINGS Cardiovascular: No significant vascular findings. Normal heart size. No pericardial effusion. Mediastinum/Nodes: Subcentimeter paratracheal lymph nodes unchanged from prior. No lymphadenopathy. RIGHT hilar lymph node measuring 9 mm unchanged (image 32/2. Similar LEFT hilar node measuring 8 mm not significantly changed. No supraclavicular or axillary adenopathy. Trachea and esophagus normal. Lungs/Pleura: No suspicious nodularity. Band of pleural-parenchymal thickening in the RIGHT lower lobe is similar. Subpleural reticulation consistent chronic lung disease unchanged. Musculoskeletal: Sclerotic vertebral body at T5 again noted unchanged. Lytic lesion at T9 measuring 15 mm is unchanged. Chronic compression fracture 18 pathologic at T12 unchanged. No new sclerotic lesions lytic lesions pathologic fractures. CT ABDOMEN AND PELVIS FINDINGS Hepatobiliary: No focal hepatic lesion. No biliary ductal  dilatation. Gallbladder is normal. Common bile duct is normal. Pancreas: Fatty replacement of the pancreas.  No suspicious lesion. Spleen: Normal spleen Adrenals/urinary tract: New soft tissue nodule adjacent to the RIGHT adrenal gland measures 2.7 x 2.8 cm (image 65/2). Multiplanar evaluation favors adrenal gland origin. LEFT adrenal gland normal.  Kidneys, ureters and bladder normal. Stomach/Bowel: Stomach,  small bowel, appendix, and cecum are normal. Large stool ball in the rectum. Rectum distended to 10 cm in diameter. No obstructing lesion identified. Vascular/Lymphatic: Abdominal aorta is normal caliber. There is no retroperitoneal or periportal lymphadenopathy. No pelvic lymphadenopathy. Reproductive: Unremarkable Other: No free fluid. Musculoskeletal: No suspicious lesion in the pelvis or lumbar spine. Thoracic metastasis described in chest IMPRESSION: Chest Impression: 1. No evidence of lung cancer recurrence within the pulmonary parenchyma. 2. Stable small mediastinal hilar lymph nodes. 3. Stable skeletal metastasis in the thoracic spine Abdomen / Pelvis Impression: 1. 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 2. Large stool ball in the rectum. These results will be called to the ordering clinician or representative by the Radiologist Assistant, and communication documented in the PACS or [employer]. Electronically Signed   By: Suzy Bouchard M.D.   On: 02/17/2022 08:33      IMPRESSION/PLAN: 1. 66 y.o. man with a new 2.8 cm metastatic lesion adjacent to the right adrenal gland secondary to metastatic NSCLC  Today, I talked to the patient about the findings and workup thus far. The patient is familiar with radiation therapy, having previously received treatment to his thoracic spine. For his new right adrenal metastasis, the recommendation is for a 10 fraction course of UHRT, possibly a 5 fraction course of SBRT. We reviewed the anticipated acute and late sequelae associated with  radiation in this setting. The patient was encouraged to ask questions that were answered to his stated satisfaction.  At the end of our conversation, the patient is interested in proceeding with the recommended 5-10 fraction course of radiotherapy to the new right adrenal metastasis.  He is currently residing at [facility] SNF and requires EMS transportation to and from his appointments due to his current status of being nonambulatory.  We will coordinate for a CT simulation/treatment planning appointment, first available, in anticipation of beginning his treatments in the near future.  He appears to have a good understanding of his disease and our treatment recommendations and is comfortable and in agreement with the stated plan.  We enjoyed meeting with him again today and look forward to continuing to participate in his care.  I personally spent 30 minutes in this encounter including chart review, reviewing radiological studies, telephone conversation with the patient, entering orders and completing documentation.    Nicholos Johns, PA-C    Tyler Pita, MD  [facility] Direct Dial: [phone]  Fax: [phone] .com  Skype  LinkedIn   This document serves as a record of services personally performed by Tyler Pita, MD and Freeman Caldron, PA-C. It was created on their behalf by Wilburn Mylar, a trained medical scribe. The creation of this record is based on the scribe's personal observations and the provider's statements to them. This document has been checked and approved by the attending provider.

## 2022-03-01 ENCOUNTER — Other Ambulatory Visit (HOSPITAL_COMMUNITY): Payer: Self-pay

## 2022-03-09 ENCOUNTER — Other Ambulatory Visit (HOSPITAL_COMMUNITY): Payer: Self-pay

## 2022-03-10 ENCOUNTER — Inpatient Hospital Stay: Payer: Medicare Other | Admitting: Physician Assistant

## 2022-03-10 ENCOUNTER — Inpatient Hospital Stay: Payer: Medicare Other

## 2022-03-10 ENCOUNTER — Ambulatory Visit
Admission: RE | Admit: 2022-03-10 | Discharge: 2022-03-10 | Disposition: A | Discharge status: Home / Self Care | Payer: Medicare Other | Source: Ambulatory Visit | Attending: Radiation Oncology | Admitting: Radiation Oncology

## 2022-03-10 DIAGNOSIS — C7971 Secondary malignant neoplasm of right adrenal gland: Secondary | ICD-10-CM | POA: Diagnosis present

## 2022-03-10 DIAGNOSIS — C3492 Malignant neoplasm of unspecified part of left bronchus or lung: Secondary | ICD-10-CM

## 2022-03-10 DIAGNOSIS — C801 Malignant (primary) neoplasm, unspecified: Secondary | ICD-10-CM

## 2022-03-10 LAB — CBC WITH DIFFERENTIAL (CANCER CENTER ONLY)
Abs Immature Granulocytes: 0.03 10*3/uL (ref 0.00–0.07)
Basophils Absolute: 0 10*3/uL (ref 0.0–0.1)
Basophils Relative: 0 %
Eosinophils Absolute: 0.5 10*3/uL (ref 0.0–0.5)
Eosinophils Relative: 7 %
HCT: 32.8 % — ABNORMAL LOW (ref 39.0–52.0)
Hemoglobin: 10.6 g/dL — ABNORMAL LOW (ref 13.0–17.0)
Immature Granulocytes: 0 %
Lymphocytes Relative: 10 %
Lymphs Abs: 0.7 10*3/uL (ref 0.7–4.0)
MCH: 26.7 pg (ref 26.0–34.0)
MCHC: 32.3 g/dL (ref 30.0–36.0)
MCV: 82.6 fL (ref 80.0–100.0)
Monocytes Absolute: 0.7 10*3/uL (ref 0.1–1.0)
Monocytes Relative: 10 %
Neutro Abs: 5.2 10*3/uL (ref 1.7–7.7)
Neutrophils Relative %: 73 %
Platelet Count: 271 10*3/uL (ref 150–400)
RBC: 3.97 MIL/uL — ABNORMAL LOW (ref 4.22–5.81)
RDW: 15.2 % (ref 11.5–15.5)
WBC Count: 7.2 10*3/uL (ref 4.0–10.5)
nRBC: 0 % (ref 0.0–0.2)

## 2022-03-10 LAB — CMP (CANCER CENTER ONLY)
ALT: 7 U/L (ref 0–44)
AST: 13 U/L — ABNORMAL LOW (ref 15–41)
Albumin: 3.5 g/dL (ref 3.5–5.0)
Alkaline Phosphatase: 67 U/L (ref 38–126)
Anion gap: 3 — ABNORMAL LOW (ref 5–15)
BUN: 15 mg/dL (ref 8–23)
CO2: 28 mmol/L (ref 22–32)
Calcium: 9 mg/dL (ref 8.9–10.3)
Chloride: 106 mmol/L (ref 98–111)
Creatinine: 0.47 mg/dL — ABNORMAL LOW (ref 0.61–1.24)
GFR, Estimated: >60 mL/min (ref >60)
Glucose, Bld: 110 mg/dL — ABNORMAL HIGH (ref 70–99)
Potassium: 4 mmol/L (ref 3.5–5.1)
Sodium: 137 mmol/L (ref 135–145)
Total Bilirubin: 0.4 mg/dL (ref 0.3–1.2)
Total Protein: 6.4 g/dL — ABNORMAL LOW (ref 6.5–8.1)

## 2022-03-10 NOTE — Progress Notes (Signed)
  Radiation Oncology         [phone] ________________________________  Name: Christopher Dawson MRN: [id number]  Date: 03/10/2022  DOB: [birth year]  STEREOTACTIC BODY RADIOTHERAPY SIMULATION AND TREATMENT PLANNING NOTE    ICD-10-CM   1. 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  Identity was confirmed.  All relevant records and images related to the planned course of therapy were reviewed.  The patient freely provided informed written consent to proceed with treatment after reviewing the details related to the planned course of therapy. The consent form was witnessed and verified by the simulation staff.  Then, the patient was set-up in a stable reproducible  supine position for radiation therapy.  A BodyFix immobilization pillow was fabricated for reproducible positioning.  Surface markings were placed.  The CT images were loaded into the planning software.  The gross target volumes (GTV) and planning target volumes (PTV) were delinieated, and avoidance structures were contoured.  Treatment planning then occurred.  The radiation prescription was entered and confirmed.  A total of two complex treatment devices were fabricated in the form of the BodyFix immobilization pillow and a neck accuform cushion.  I have requested : 3D Simulation  I have requested a DVH of the following structures: targets and all normal structures near the target including kidneys, spinal cord, liver and others as noted on the radiation plan to maintain doses in adherence with established limits  SPECIAL TREATMENT PROCEDURE:  The planned course of therapy using radiation constitutes a special treatment procedure. Special care is required in the  management of this patient for the following reasons. High dose per fraction requiring special monitoring for increased toxicities of treatment including daily imaging..  The special nature of the planned course of radiotherapy will require increased physician supervision and oversight to ensure patient's safety with optimal treatment outcomes.    This requires extended time and effort.    PLAN:  The patient will receive 50 Gy in 5 fractions if duodenal sparing can be achieved.  If duodenal sparing is not achieved with a 5-fraction plan, we will try 50 Gy in 10 fraction Ultrahypofractionated Radiotherapy [facility]) for ablation..  ________________________________  Sheral Apley Tammi Klippel, M.D.

## 2022-03-13 ENCOUNTER — Other Ambulatory Visit (HOSPITAL_COMMUNITY): Payer: Self-pay

## 2022-03-15 ENCOUNTER — Other Ambulatory Visit (HOSPITAL_COMMUNITY): Payer: Self-pay

## 2022-03-17 ENCOUNTER — Other Ambulatory Visit (HOSPITAL_COMMUNITY): Payer: Self-pay

## 2022-03-20 ENCOUNTER — Other Ambulatory Visit (HOSPITAL_COMMUNITY): Payer: Self-pay

## 2022-03-20 DIAGNOSIS — C7971 Secondary malignant neoplasm of right adrenal gland: Secondary | ICD-10-CM | POA: Diagnosis not present

## 2022-03-24 NOTE — Progress Notes (Unsigned)
[facility] OFFICE PROGRESS NOTE  [name], MD [place] [address]  DIAGNOSIS: Stage IV (T1c, N1, M1 C) non-small cell lung cancer, adenosquamous carcinoma presented with left upper lobe lung nodule in addition to left hilar adenopathy and bone metastasis diagnosed January 2023.   Molecular studies by foundation 1 showed positive KRAS G12C mutation   PD-L1 expression 10%  PRIOR THERAPY: 1) Palliative radiotherapy to the T12 destructive pathologic fracture under the care of Dr. Tammi Klippel completed on May 10, 2021. 2) Systemic chemotherapy with carboplatin for AUC of 5, Alimta 500 Mg/M2 and Keytruda 200 Mg IV every 3 weeks. Last dose on 08/08/21. Status post 3 cycles. Discontinued due to disease progression  CURRENT THERAPY:   1) Krazati p.o. daily.  First dose Sep 07, 2021.  Status post 6.5 months of treatment.   2) SBRT to adrenal lesion under the care of Dr. Tammi Klippel. Last dose expected on 12/14.   INTERVAL HISTORY: Christopher Dawson 66 y.o. male returns to the clinic today for a follow-up visit.  The patient is currently a resident of a skilled nursing facility.  The patient is not ambulatory and he comes to his appointments at the clinic on a stretcher.  The patient is unable to ambulate due to weakness in his lower extremity secondary to thoracic and lumbar metastatic lesions with pathological and epidural extension.  The patient frequently expresses desire to return home although he has poor family support and of course is not ambulatory.   When the patient was last seen, the patient had an enlarging adrenal lesion. He is undergoing SBRT under the care of Dr. Tammi Klippel. The last radiation appointment is scheduled for 12/14. He is supposed to start today. He mentions the radiation marking had fallen off due to him scratching the area.   The patient is currently undergoing targeted treatment with [place].  He has been tolerating this well  without any concerning adverse side effects.  Today, the patient denies any changes in his health. Denies any fever or chills.  He has some occasional night sweats around the collar pressure which has been going on for "a while".  Denies any chest pain or hemoptysis.  He has not had dyspnea or cough.  Denies any nausea, vomiting, or diarrhea.  He reports baseline constipation for which he takes stool softeners and medicine provided by his facility. He Is here for evaluation and repeat blood work.    MEDICAL HISTORY: Past Medical History:  Diagnosis Date   Anxiety    Depression    Lumbar radiculopathy, chronic 04/24/2006   Marfan syndrome    Neurosis, depressive    nscl ca with bone mets 04/2021   Shortening, leg, congenital     ALLERGIES:  is allergic to codeine.  MEDICATIONS:  Current Outpatient Medications  Medication Sig Dispense Refill   acetaminophen (TYLENOL) 325 MG tablet Take 2 tablets (650 mg total) by mouth every 6 (six) hours as needed for mild pain (or Fever >/= 101). 30 tablet 0   adagrasib (KRAZATI) 200 MG tablet Take 3 tablets (600 mg) by mouth in the morning and at bedtime. 180 tablet 3   Amino Acids-Protein Hydrolys (PRO-STAT AWC) LIQD Take 30 mLs by mouth daily. Take 77m by mouth daily     busPIRone (BUSPAR) 7.5 MG tablet Take 7.5 mg by mouth daily.     feeding supplement (ENSURE ENLIVE / ENSURE PLUS) LIQD Take 237 mLs by mouth 3 (three) times daily between meals.  237 mL 12   fentaNYL (DURAGESIC) 12 MCG/HR Place 1 patch onto the skin every 3 (three) days.     ferrous sulfate 325 (65 FE) MG tablet Take 325 mg by mouth 2 (two) times daily.     folic acid (FOLVITE) 1 MG tablet Take 1 tablet (1 mg total) by mouth daily. 30 tablet 4   gabapentin (NEURONTIN) 100 MG capsule Take 1 capsule (100 mg total) by mouth 3 (three) times daily. 90 capsule 0   hydrocortisone cream 1 % Apply 1 Application topically 2 (two) times daily. To back twice daily for 1 week for itch.      hydrOXYzine (ATARAX) 50 MG tablet Take 50 mg by mouth 3 (three) times daily.     lactulose (CHRONULAC) 10 GM/15ML solution Take 45 mLs (30 g total) by mouth 2 (two) times daily. 236 mL 0   methylPREDNISolone (MEDROL DOSEPAK) 4 MG TBPK tablet Use as instructed 21 tablet 0   Multiple Vitamin (MULTIVITAMIN WITH MINERALS) TABS tablet Take 1 tablet by mouth daily. 30 tablet 0   ondansetron (ZOFRAN) 4 MG tablet Take 1 tablet (4 mg total) by mouth every 6 (six) hours as needed for nausea. 20 tablet 0   oxycodone (OXY-IR) 5 MG capsule Take 5 mg by mouth every 6 (six) hours as needed for pain (breakthrough pain). Take 1 tab by mouth every six hours for pain.     oxyCODONE (OXYCONTIN) 15 mg 12 hr tablet Take 15 mg by mouth every 12 (twelve) hours.     polyethylene glycol (MIRALAX / GLYCOLAX) 17 g packet Take 17 g by mouth daily.     prochlorperazine (COMPAZINE) 10 MG tablet Take 1 tablet (10 mg total) by mouth every 6 (six) hours as needed for nausea or vomiting. 30 tablet 0   senna-docusate (SENOKOT-S) 8.6-50 MG tablet Take 1 tablet by mouth 2 (two) times daily. 30 tablet 0   thiamine 100 MG tablet Take 1 tablet (100 mg total) by mouth daily. 30 tablet 0   No current facility-administered medications for this visit.    SURGICAL HISTORY:  Past Surgical History:  Procedure Laterality Date   LEG SURGERY      REVIEW OF SYSTEMS:   Constitutional: Negative for appetite change, chills, fatigue, fever and unexpected weight change.  HENT: Negative for mouth sores, nosebleeds, sore throat and trouble swallowing.   Eyes: Negative for eye problems and icterus.  Respiratory: Negative for cough, hemoptysis, shortness of breath and wheezing.   Cardiovascular: Negative for chest pain and leg swelling.  Gastrointestinal: Negative for abdominal pain, diarrhea, nausea and vomiting. Constipation, lack of appetite Genitourinary: Negative for bladder incontinence, difficulty urinating, dysuria, frequency and hematuria.    Musculoskeletal: Negative for back pain, neck pain and neck stiffness. Arrives on stretcher  Skin: Negative for itching and rash.  Neurological: Negative for dizziness, extremity weakness, headaches, light-headedness and seizures.  Hematological: Negative for adenopathy. Does not bruise/bleed easily.  Psychiatric/Behavioral: Negative for confusion, depression and sleep disturbance. The patient is not nervous/anxious.       PHYSICAL EXAMINATION:  There were no vitals taken for this visit.  ECOG PERFORMANCE STATUS: 4  Physical Exam  Constitutional: Oriented to person, place, and time and well-developed, well-nourished, and in no distress.  HENT:  Head: Normocephalic and atraumatic.  Mouth/Throat: Oropharynx is clear and moist. No oropharyngeal exudate.  Eyes: Conjunctivae are normal. Right eye exhibits no discharge. Left eye exhibits no discharge. No scleral icterus.  Neck: Normal range of motion. Neck supple.  Cardiovascular:  heart sounds Distant  Pulmonary/Chest: Effort normal and breath sounds normal. No respiratory distress. No wheezes. No rales.  Abdominal: Exhibits no distension  Musculoskeletal: Exhibits no edema. On stretcher  Lymphadenopathy:    No cervical adenopathy observed.  Neurological: Alert and oriented to person, place, and time.  Skin: Skin is warm and dry. No rash noted. Not diaphoretic. No erythema. No pallor.  Neurological: Alert and oriented to person, place, and time. Exhibits muscle wasting. Examined on the stretcher.   Psychiatric: Mood, memory and judgment normal.  Vitals reviewed.  LABORATORY DATA: Lab Results  Component Value Date   WBC 7.2 03/10/2022   HGB 10.6 (L) 03/10/2022   HCT 32.8 (L) 03/10/2022   MCV 82.6 03/10/2022   PLT 271 03/10/2022      Chemistry      Component Value Date/Time   NA 137 03/10/2022 0921   NA 146 (H) 07/04/2018 1642   K 4.0 03/10/2022 0921   CL 106 03/10/2022 0921   CO2 28 03/10/2022 0921   BUN 15 03/10/2022  0921   BUN 13 07/04/2018 1642   CREATININE 0.47 (L) 03/10/2022 0921   CREATININE 0.95 07/04/2012 1534      Component Value Date/Time   CALCIUM 9.0 03/10/2022 0921   ALKPHOS 67 03/10/2022 0921   AST 13 (L) 03/10/2022 0921   ALT 7 03/10/2022 0921   BILITOT 0.4 03/10/2022 0921       RADIOGRAPHIC STUDIES:  No results found.   ASSESSMENT/PLAN:  This is a very pleasant 66 year old Caucasian male with stage IV non-small cell lung cancer, adenosquamous carcinoma.  The patient presented with a left upper lobe spiculated nodule with peripheral stranding and osseous metastatic disease, some of which involves epidural extension.  The patient was diagnosed in January 2023.  The patient's molecular studies show he is positive for K-ras G12 C mutation which may use in the second line setting. His PDL1 expression ins negative.    The patient completed palliative radiation to the T12 lesion under the care of Dr. Tammi Klippel on 05/10/21.    He initially started palliative systemic chemotherapy with carboplatin for AUC of 5, Alimta 500 Mg/M2 and Keytruda 200 Mg IV every 3 weeks.  First cycle June 30, 2021.  Status post 3 cycles.  The patient tolerated the second cycle of his treatment fairly well except for fatigue. Unfortunately, the patient had been in such a disease progression with progression of the left upper lobe lung lesion, left hilar and subcarinal adenopathy.  He also had new sclerotic lesion at T1.   Therefore, the patient started targeted treatment with [place].  He started this in May 2023.  He has been on this for approximately 6.5 months.  He is tolerating this well without any concerning adverse side effects, he is having significant itching for the last several months.  Unclear if this is related.  The patient was recently found to have enlarging adrenal lesion. He is undergoing SBRT under the care of Dr. Tammi Klippel and his last radiation is scheduled for 12/14.  We will see him back for a for  a follow up visit in 4-6 weeks for evaluation and repeat lab work.    I will arrange for restaging CT scan of the chest, abdomen, and pelvis prior to his next appointment.   The patient was advised to call immediately if she has any concerning symptoms in the interval. The patient voices understanding of current disease status and treatment options and is in agreement with  the current care plan. All questions were answered. The patient knows to call the clinic with any problems, questions or concerns. We can certainly see the patient much sooner if necessary      No orders of the defined types were placed in this encounter.    The total time spent in the appointment was 20-29 minutes.   Christopher Kean L Birgitta Uhlir, PA-C 03/24/22

## 2022-03-27 ENCOUNTER — Ambulatory Visit
Admission: RE | Admit: 2022-03-27 | Discharge: 2022-03-27 | Disposition: A | Discharge status: Home / Self Care | Payer: Medicare Other | Source: Ambulatory Visit | Attending: Radiation Oncology | Admitting: Radiation Oncology

## 2022-03-27 ENCOUNTER — Inpatient Hospital Stay: Payer: Medicare Other | Attending: Physician Assistant

## 2022-03-27 ENCOUNTER — Inpatient Hospital Stay (HOSPITAL_BASED_OUTPATIENT_CLINIC_OR_DEPARTMENT_OTHER): Payer: Medicare Other | Admitting: Physician Assistant

## 2022-03-27 ENCOUNTER — Other Ambulatory Visit: Payer: Self-pay

## 2022-03-27 VITALS — BP 96/65 | HR 85 | Temp 97.6°F | Resp 16

## 2022-03-27 DIAGNOSIS — Z7952 Long term (current) use of systemic steroids: Secondary | ICD-10-CM | POA: Diagnosis not present

## 2022-03-27 DIAGNOSIS — C3492 Malignant neoplasm of unspecified part of left bronchus or lung: Secondary | ICD-10-CM

## 2022-03-27 DIAGNOSIS — E279 Disorder of adrenal gland, unspecified: Secondary | ICD-10-CM | POA: Diagnosis not present

## 2022-03-27 DIAGNOSIS — C7951 Secondary malignant neoplasm of bone: Secondary | ICD-10-CM | POA: Insufficient documentation

## 2022-03-27 DIAGNOSIS — C3412 Malignant neoplasm of upper lobe, left bronchus or lung: Secondary | ICD-10-CM | POA: Diagnosis not present

## 2022-03-27 DIAGNOSIS — C7971 Secondary malignant neoplasm of right adrenal gland: Secondary | ICD-10-CM | POA: Insufficient documentation

## 2022-03-27 DIAGNOSIS — Z79899 Other long term (current) drug therapy: Secondary | ICD-10-CM | POA: Insufficient documentation

## 2022-03-27 DIAGNOSIS — K5900 Constipation, unspecified: Secondary | ICD-10-CM | POA: Diagnosis not present

## 2022-03-27 DIAGNOSIS — R61 Generalized hyperhidrosis: Secondary | ICD-10-CM | POA: Insufficient documentation

## 2022-03-27 DIAGNOSIS — C801 Malignant (primary) neoplasm, unspecified: Secondary | ICD-10-CM | POA: Insufficient documentation

## 2022-03-27 DIAGNOSIS — Z923 Personal history of irradiation: Secondary | ICD-10-CM | POA: Insufficient documentation

## 2022-03-27 LAB — RAD ONC ARIA SESSION SUMMARY
Course Elapsed Days: 0
Plan Fractions Treated to Date: 1
Plan Prescribed Dose Per Fraction: 10 Gy
Plan Total Fractions Prescribed: 5
Plan Total Prescribed Dose: 50 Gy
Reference Point Dosage Given to Date: 10 Gy
Reference Point Session Dosage Given: 10 Gy
Session Number: 1

## 2022-03-27 LAB — CMP (CANCER CENTER ONLY)
ALT: 15 U/L (ref 0–44)
AST: 17 U/L (ref 15–41)
Albumin: 3.5 g/dL (ref 3.5–5.0)
Alkaline Phosphatase: 64 U/L (ref 38–126)
Anion gap: 7 (ref 5–15)
BUN: 11 mg/dL (ref 8–23)
CO2: 27 mmol/L (ref 22–32)
Calcium: 8.9 mg/dL (ref 8.9–10.3)
Chloride: 103 mmol/L (ref 98–111)
Creatinine: 0.51 mg/dL — ABNORMAL LOW (ref 0.61–1.24)
GFR, Estimated: >60 mL/min (ref >60)
Glucose, Bld: 98 mg/dL (ref 70–99)
Potassium: 4.2 mmol/L (ref 3.5–5.1)
Sodium: 137 mmol/L (ref 135–145)
Total Bilirubin: 0.4 mg/dL (ref 0.3–1.2)
Total Protein: 6.4 g/dL — ABNORMAL LOW (ref 6.5–8.1)

## 2022-03-27 LAB — CBC WITH DIFFERENTIAL (CANCER CENTER ONLY)
Abs Immature Granulocytes: 0.04 10*3/uL (ref 0.00–0.07)
Basophils Absolute: 0 10*3/uL (ref 0.0–0.1)
Basophils Relative: 0 %
Eosinophils Absolute: 0.6 10*3/uL — ABNORMAL HIGH (ref 0.0–0.5)
Eosinophils Relative: 9 %
HCT: 31.2 % — ABNORMAL LOW (ref 39.0–52.0)
Hemoglobin: 10 g/dL — ABNORMAL LOW (ref 13.0–17.0)
Immature Granulocytes: 1 %
Lymphocytes Relative: 8 %
Lymphs Abs: 0.6 10*3/uL — ABNORMAL LOW (ref 0.7–4.0)
MCH: 26.2 pg (ref 26.0–34.0)
MCHC: 32.1 g/dL (ref 30.0–36.0)
MCV: 81.7 fL (ref 80.0–100.0)
Monocytes Absolute: 0.7 10*3/uL (ref 0.1–1.0)
Monocytes Relative: 9 %
Neutro Abs: 5.2 10*3/uL (ref 1.7–7.7)
Neutrophils Relative %: 73 %
Platelet Count: 325 10*3/uL (ref 150–400)
RBC: 3.82 MIL/uL — ABNORMAL LOW (ref 4.22–5.81)
RDW: 14.5 % (ref 11.5–15.5)
WBC Count: 7.1 10*3/uL (ref 4.0–10.5)
nRBC: 0 % (ref 0.0–0.2)

## 2022-03-29 ENCOUNTER — Ambulatory Visit
Admission: RE | Admit: 2022-03-29 | Discharge: 2022-03-29 | Disposition: A | Discharge status: Home / Self Care | Payer: Medicare Other | Source: Ambulatory Visit | Attending: Radiation Oncology | Admitting: Radiation Oncology

## 2022-03-29 ENCOUNTER — Other Ambulatory Visit: Payer: Self-pay

## 2022-03-29 DIAGNOSIS — C7971 Secondary malignant neoplasm of right adrenal gland: Secondary | ICD-10-CM | POA: Diagnosis present

## 2022-03-29 DIAGNOSIS — C3492 Malignant neoplasm of unspecified part of left bronchus or lung: Secondary | ICD-10-CM

## 2022-03-29 DIAGNOSIS — C801 Malignant (primary) neoplasm, unspecified: Secondary | ICD-10-CM | POA: Insufficient documentation

## 2022-03-29 LAB — RAD ONC ARIA SESSION SUMMARY
Course Elapsed Days: 2
Plan Fractions Treated to Date: 2
Plan Prescribed Dose Per Fraction: 10 Gy
Plan Total Fractions Prescribed: 5
Plan Total Prescribed Dose: 50 Gy
Reference Point Dosage Given to Date: 20 Gy
Reference Point Session Dosage Given: 10 Gy
Session Number: 2

## 2022-03-31 ENCOUNTER — Ambulatory Visit
Admission: RE | Admit: 2022-03-31 | Discharge: 2022-03-31 | Disposition: A | Discharge status: Home / Self Care | Payer: Medicare Other | Source: Ambulatory Visit | Attending: Radiation Oncology | Admitting: Radiation Oncology

## 2022-03-31 ENCOUNTER — Other Ambulatory Visit: Payer: Self-pay

## 2022-03-31 DIAGNOSIS — C801 Malignant (primary) neoplasm, unspecified: Secondary | ICD-10-CM

## 2022-03-31 DIAGNOSIS — C3492 Malignant neoplasm of unspecified part of left bronchus or lung: Secondary | ICD-10-CM | POA: Diagnosis not present

## 2022-03-31 LAB — RAD ONC ARIA SESSION SUMMARY
Course Elapsed Days: 4
Plan Fractions Treated to Date: 3
Plan Prescribed Dose Per Fraction: 10 Gy
Plan Total Fractions Prescribed: 5
Plan Total Prescribed Dose: 50 Gy
Reference Point Dosage Given to Date: 30 Gy
Reference Point Session Dosage Given: 10 Gy
Session Number: 3

## 2022-04-04 ENCOUNTER — Other Ambulatory Visit: Payer: Self-pay

## 2022-04-04 ENCOUNTER — Ambulatory Visit
Admission: RE | Admit: 2022-04-04 | Discharge: 2022-04-04 | Disposition: A | Discharge status: Home / Self Care | Payer: Medicare Other | Source: Ambulatory Visit | Attending: Radiation Oncology | Admitting: Radiation Oncology

## 2022-04-04 ENCOUNTER — Telehealth: Payer: Self-pay | Admitting: Internal Medicine

## 2022-04-04 DIAGNOSIS — C3492 Malignant neoplasm of unspecified part of left bronchus or lung: Secondary | ICD-10-CM | POA: Diagnosis not present

## 2022-04-04 DIAGNOSIS — C7971 Secondary malignant neoplasm of right adrenal gland: Secondary | ICD-10-CM

## 2022-04-04 LAB — RAD ONC ARIA SESSION SUMMARY
Course Elapsed Days: 8
Plan Fractions Treated to Date: 4
Plan Prescribed Dose Per Fraction: 10 Gy
Plan Total Fractions Prescribed: 5
Plan Total Prescribed Dose: 50 Gy
Reference Point Dosage Given to Date: 40 Gy
Reference Point Session Dosage Given: 10 Gy
Session Number: 4

## 2022-04-04 NOTE — Telephone Encounter (Signed)
Called patient regarding upcoming January appointment, left a voicemail.

## 2022-04-06 ENCOUNTER — Ambulatory Visit
Admission: RE | Admit: 2022-04-06 | Discharge: 2022-04-06 | Disposition: A | Discharge status: Home / Self Care | Payer: Medicare Other | Source: Ambulatory Visit | Attending: Radiation Oncology | Admitting: Radiation Oncology

## 2022-04-06 ENCOUNTER — Encounter: Payer: Self-pay | Admitting: Urology

## 2022-04-06 ENCOUNTER — Other Ambulatory Visit: Payer: Self-pay

## 2022-04-06 DIAGNOSIS — C3492 Malignant neoplasm of unspecified part of left bronchus or lung: Secondary | ICD-10-CM | POA: Diagnosis not present

## 2022-04-06 DIAGNOSIS — C7971 Secondary malignant neoplasm of right adrenal gland: Secondary | ICD-10-CM

## 2022-04-06 LAB — RAD ONC ARIA SESSION SUMMARY
Course Elapsed Days: 10
Plan Fractions Treated to Date: 5
Plan Prescribed Dose Per Fraction: 10 Gy
Plan Total Fractions Prescribed: 5
Plan Total Prescribed Dose: 50 Gy
Reference Point Dosage Given to Date: 50 Gy
Reference Point Session Dosage Given: 10 Gy
Session Number: 5

## 2022-04-07 ENCOUNTER — Other Ambulatory Visit (HOSPITAL_COMMUNITY): Payer: Self-pay

## 2022-04-11 ENCOUNTER — Telehealth: Payer: Self-pay | Admitting: Family Medicine

## 2022-04-11 NOTE — Telephone Encounter (Signed)
Left message for patient to call back and schedule Medicare Annual Wellness Visit (AWV) in office.   If not able to come in office, please offer to do virtually or by telephone.  Left office number and my jabber [phone].  AWVI eligible as of 03/24/2021  Please schedule at anytime with Nurse Health Advisor.

## 2022-04-12 ENCOUNTER — Other Ambulatory Visit: Payer: Self-pay

## 2022-04-14 ENCOUNTER — Other Ambulatory Visit: Payer: Self-pay | Admitting: Internal Medicine

## 2022-04-14 ENCOUNTER — Other Ambulatory Visit: Payer: Self-pay

## 2022-04-14 ENCOUNTER — Other Ambulatory Visit (HOSPITAL_COMMUNITY): Payer: Self-pay

## 2022-04-14 DIAGNOSIS — C3492 Malignant neoplasm of unspecified part of left bronchus or lung: Secondary | ICD-10-CM

## 2022-04-14 MED ORDER — KRAZATI 200 MG PO TABS
600.0000 mg | ORAL_TABLET | Freq: Two times a day (BID) | ORAL | 3 refills | Status: DC
  Filled 2022-04-14: qty 180, 30d supply, fill #0
  Filled 2022-05-02: qty 180, 30d supply, fill #1
  Filled 2022-06-05: qty 180, 30d supply, fill #2
  Filled 2022-07-06: qty 180, 30d supply, fill #3

## 2022-04-18 ENCOUNTER — Other Ambulatory Visit (HOSPITAL_COMMUNITY): Payer: Self-pay

## 2022-04-18 ENCOUNTER — Other Ambulatory Visit: Payer: Self-pay

## 2022-04-19 ENCOUNTER — Other Ambulatory Visit: Payer: Self-pay

## 2022-04-19 ENCOUNTER — Other Ambulatory Visit (HOSPITAL_COMMUNITY): Payer: Self-pay

## 2022-04-21 ENCOUNTER — Other Ambulatory Visit (HOSPITAL_COMMUNITY): Payer: Self-pay

## 2022-05-02 ENCOUNTER — Other Ambulatory Visit (HOSPITAL_COMMUNITY): Payer: Self-pay

## 2022-05-05 ENCOUNTER — Ambulatory Visit (HOSPITAL_COMMUNITY): Payer: Medicare Other

## 2022-05-05 ENCOUNTER — Telehealth: Payer: Self-pay | Admitting: Physician Assistant

## 2022-05-05 NOTE — Progress Notes (Deleted)
[facility] OFFICE PROGRESS NOTE  [name], MD [place] [address]  DIAGNOSIS: Stage IV (T1c, N1, M1 C) non-small cell lung cancer, adenosquamous carcinoma presented with left upper lobe lung nodule in addition to left hilar adenopathy and bone metastasis diagnosed January 2023.   Molecular studies by foundation 1 showed positive KRAS G12C mutation   PD-L1 expression 10%  PRIOR THERAPY: 1) Palliative radiotherapy to the T12 destructive pathologic fracture under the care of Dr. Tammi Klippel completed on May 10, 2021. 2) Systemic chemotherapy with carboplatin for AUC of 5, Alimta 500 Mg/M2 and Keytruda 200 Mg IV every 3 weeks. Last dose on 08/08/21. Status post 3 cycles. Discontinued due to disease progression 3) 2) SBRT to adrenal lesion under the care of Dr. Tammi Klippel. Last dose expected on 12/14.   CURRENT THERAPY: 1) Krazati p.o. daily.  First dose Sep 07, 2021.  Status post 6.5 months of treatment.    INTERVAL HISTORY: Christopher Dawson 67 y.o. male returns to clinic today for follow-up visit.  The patient is currently a resident of a skilled nursing facility.  He is not ambulatory and comes to this appointment on a stretcher.  The patient is unable to ambulate due to weakness in his lower extremity secondary to thoracic and lumbar metastatic lesions with pathological and epidural extension.  This is his baseline.  The patient was last seen in clinic by me on 12/4/thousand 23.  On the patient's last restaging CT scan he had an enlarging adrenal lesion for which she completed SBRT to this area under the care of Dr. Tammi Klippel and his last treatment was on 12/14/thousand 23.  He is currently undergoing targeted systemic treatment with [name].  He has been tolerating this well without any concerning adverse side effects. Today, the patient denies any changes in his health. Denies any fever or chills.  He has some occasional night sweats around the  collar pressure which has been going on for "a while".  Denies any chest pain or hemoptysis.  He has not had dyspnea or cough.  Denies any nausea, vomiting, or diarrhea.  He reports baseline constipation for which he takes stool softeners and medicine provided by his facility.  He is supposed to have a restaging CT scan on 05/05/22 he did not show up to his appointment.  He Is here for evaluation and repeat blood work.       MEDICAL HISTORY: Past Medical History:  Diagnosis Date   Anxiety    Depression    Lumbar radiculopathy, chronic 04/24/2006   Marfan syndrome    Neurosis, depressive    nscl ca with bone mets 04/2021   Shortening, leg, congenital     ALLERGIES:  is allergic to codeine.  MEDICATIONS:  Current Outpatient Medications  Medication Sig Dispense Refill   acetaminophen (TYLENOL) 325 MG tablet Take 2 tablets (650 mg total) by mouth every 6 (six) hours as needed for mild pain (or Fever >/= 101). 30 tablet 0   adagrasib (KRAZATI) 200 MG tablet Take 3 tablets (600 mg) by mouth in the morning and at bedtime. 180 tablet 3   Amino Acids-Protein Hydrolys (PRO-STAT AWC) LIQD Take 30 mLs by mouth daily. Take 28ml by mouth daily     busPIRone (BUSPAR) 7.5 MG tablet Take 7.5 mg by mouth daily.     ciclopirox (LOPROX) 0.77 % SUSP Apply topically.     feeding supplement (ENSURE ENLIVE / ENSURE PLUS) LIQD Take 237 mLs by mouth 3 (  three) times daily between meals. 237 mL 12   fentaNYL (DURAGESIC) 12 MCG/HR Place 1 patch onto the skin every 3 (three) days.     ferrous sulfate 325 (65 FE) MG tablet Take 325 mg by mouth 2 (two) times daily.     folic acid (FOLVITE) 1 MG tablet Take 1 tablet (1 mg total) by mouth daily. 30 tablet 4   gabapentin (NEURONTIN) 100 MG capsule Take 1 capsule (100 mg total) by mouth 3 (three) times daily. 90 capsule 0   hydrocortisone cream 1 % Apply 1 Application topically 2 (two) times daily. To back twice daily for 1 week for itch.     hydrOXYzine (ATARAX) 50 MG  tablet Take 50 mg by mouth 3 (three) times daily.     lactulose (CHRONULAC) 10 GM/15ML solution Take 45 mLs (30 g total) by mouth 2 (two) times daily. 236 mL 0   methylPREDNISolone (MEDROL DOSEPAK) 4 MG TBPK tablet Use as instructed 21 tablet 0   Multiple Vitamin (MULTIVITAMIN WITH MINERALS) TABS tablet Take 1 tablet by mouth daily. 30 tablet 0   ondansetron (ZOFRAN) 4 MG tablet Take 1 tablet (4 mg total) by mouth every 6 (six) hours as needed for nausea. 20 tablet 0   oxycodone (OXY-IR) 5 MG capsule Take 5 mg by mouth every 6 (six) hours as needed for pain (breakthrough pain). Take 1 tab by mouth every six hours for pain.     oxyCODONE (OXYCONTIN) 15 mg 12 hr tablet Take 15 mg by mouth every 12 (twelve) hours.     pantoprazole (PROTONIX) 40 MG tablet Take 40 mg by mouth daily.     polyethylene glycol (MIRALAX / GLYCOLAX) 17 g packet Take 17 g by mouth daily.     prochlorperazine (COMPAZINE) 10 MG tablet Take 1 tablet (10 mg total) by mouth every 6 (six) hours as needed for nausea or vomiting. 30 tablet 0   senna-docusate (SENOKOT-S) 8.6-50 MG tablet Take 1 tablet by mouth 2 (two) times daily. 30 tablet 0   thiamine 100 MG tablet Take 1 tablet (100 mg total) by mouth daily. 30 tablet 0   No current facility-administered medications for this visit.    SURGICAL HISTORY:  Past Surgical History:  Procedure Laterality Date   LEG SURGERY      REVIEW OF SYSTEMS:   Review of Systems  Constitutional: Negative for appetite change, chills, fatigue, fever and unexpected weight change.  HENT:   Negative for mouth sores, nosebleeds, sore throat and trouble swallowing.   Eyes: Negative for eye problems and icterus.  Respiratory: Negative for cough, hemoptysis, shortness of breath and wheezing.   Cardiovascular: Negative for chest pain and leg swelling.  Gastrointestinal: Negative for abdominal pain, constipation, diarrhea, nausea and vomiting.  Genitourinary: Negative for bladder incontinence,  difficulty urinating, dysuria, frequency and hematuria.   Musculoskeletal: Negative for back pain, gait problem, neck pain and neck stiffness.  Skin: Negative for itching and rash.  Neurological: Negative for dizziness, extremity weakness, gait problem, headaches, light-headedness and seizures.  Hematological: Negative for adenopathy. Does not bruise/bleed easily.  Psychiatric/Behavioral: Negative for confusion, depression and sleep disturbance. The patient is not nervous/anxious.     PHYSICAL EXAMINATION:  There were no vitals taken for this visit.  ECOG PERFORMANCE STATUS: {CHL ONC ECOG [id number]  Physical Exam  Constitutional: Oriented to person, place, and time and well-developed, well-nourished, and in no distress. No distress.  HENT:  Head: Normocephalic and atraumatic.  Mouth/Throat: Oropharynx is clear and moist. No  oropharyngeal exudate.  Eyes: Conjunctivae are normal. Right eye exhibits no discharge. Left eye exhibits no discharge. No scleral icterus.  Neck: Normal range of motion. Neck supple.  Cardiovascular: Normal rate, regular rhythm, normal heart sounds and intact distal pulses.   Pulmonary/Chest: Effort normal and breath sounds normal. No respiratory distress. No wheezes. No rales.  Abdominal: Soft. Bowel sounds are normal. Exhibits no distension and no mass. There is no tenderness.  Musculoskeletal: Normal range of motion. Exhibits no edema.  Lymphadenopathy:    No cervical adenopathy.  Neurological: Alert and oriented to person, place, and time. Exhibits normal muscle tone. Gait normal. Coordination normal.  Skin: Skin is warm and dry. No rash noted. Not diaphoretic. No erythema. No pallor.  Psychiatric: Mood, memory and judgment normal.  Vitals reviewed.  LABORATORY DATA: Lab Results  Component Value Date   WBC 7.1 03/27/2022   HGB 10.0 (L) 03/27/2022   HCT 31.2 (L) 03/27/2022   MCV 81.7 03/27/2022   PLT 325 03/27/2022      Chemistry      Component  Value Date/Time   NA 137 03/27/2022 0917   NA 146 (H) 07/04/2018 1642   K 4.2 03/27/2022 0917   CL 103 03/27/2022 0917   CO2 27 03/27/2022 0917   BUN 11 03/27/2022 0917   BUN 13 07/04/2018 1642   CREATININE 0.51 (L) 03/27/2022 0917   CREATININE 0.95 07/04/2012 1534      Component Value Date/Time   CALCIUM 8.9 03/27/2022 0917   ALKPHOS 64 03/27/2022 0917   AST 17 03/27/2022 0917   ALT 15 03/27/2022 0917   BILITOT 0.4 03/27/2022 0917       RADIOGRAPHIC STUDIES:  No results found.   ASSESSMENT/PLAN:  This is a very pleasant 67 year old Caucasian male with stage IV non-small cell lung cancer, adenosquamous carcinoma.  The patient presented with a left upper lobe spiculated nodule with peripheral stranding and osseous metastatic disease, some of which involves epidural extension.  The patient was diagnosed in January 2023.  The patient's molecular studies show he is positive for K-ras G12 C mutation which may use in the second line setting. His PDL1 expression ins negative.    The patient completed palliative radiation to the T12 lesion under the care of Dr. Tammi Klippel on 05/10/21.    He initially started palliative systemic chemotherapy with carboplatin for AUC of 5, Alimta 500 Mg/M2 and Keytruda 200 Mg IV every 3 weeks.  First cycle June 30, 2021.  Status post 3 cycles.  The patient tolerated the second cycle of his treatment fairly well except for fatigue. Unfortunately, the patient had been in such a disease progression with progression of the left upper lobe lung lesion, left hilar and subcarinal adenopathy.  He also had new sclerotic lesion at T1.  herefore, the patient started targeted treatment with [place].  He started this in May 2023.  He has been on this for approximately 6.5 months.  He is tolerating this well without any concerning adverse side effects, he is having significant itching for the last several months.  Unclear if this is related.   The patient was recently found  to have enlarging adrenal lesion.  Completed SBRT to this lesion under the care of Dr. Tammi Klippel which his last treatment was on 04/06/2022.  The patient was post have a restaging CT scan performed but he missed his appointment.  The patient was seen with Dr. Julien Nordmann today.  Labs were reviewed.  Recommend he continue on the same treatment  at the same dose although we did advise the patient to reschedule his CT scan at his earliest convenience we will have to call him with the results.  The patient was advised to call immediately if she has any concerning symptoms in the interval. The patient voices understanding of current disease status and treatment options and is in agreement with the current care plan. All questions were answered. The patient knows to call the clinic with any problems, questions or concerns. We can certainly see the patient much sooner if necessary        No orders of the defined types were placed in this encounter.    I spent {CHL ONC TIME VISIT - IWLNL:[id number]} counseling the patient face to face. The total time spent in the appointment was {CHL ONC TIME VISIT - YCXKG:[id number]}.  Dajohn Ellender L [place]ha Doster, PA-C 05/05/22

## 2022-05-05 NOTE — Telephone Encounter (Signed)
I am scheduled to see the patient on Monday but noticed that his restaging CT scan was initially scheduled for today but he no showed his appointment.  I attempted to call the patient to give him instructions on how to go about scheduling his scan.  Unable to reach him.  I left him a detailed voicemail to call radiology to reschedule his scan.  I also reminded him of his appointment on Monday.

## 2022-05-08 ENCOUNTER — Encounter: Payer: Self-pay | Admitting: Family Medicine

## 2022-05-08 ENCOUNTER — Inpatient Hospital Stay: Payer: Medicare Other | Admitting: Physician Assistant

## 2022-05-08 ENCOUNTER — Inpatient Hospital Stay: Payer: Medicare Other | Attending: Physician Assistant

## 2022-05-08 ENCOUNTER — Non-Acute Institutional Stay: Payer: Medicare Other | Admitting: Family Medicine

## 2022-05-08 ENCOUNTER — Telehealth: Payer: Self-pay | Admitting: *Deleted

## 2022-05-08 VITALS — BP 98/56 | HR 65 | Temp 98.7°F | Resp 18 | Wt 145.0 lb

## 2022-05-08 DIAGNOSIS — L299 Pruritus, unspecified: Secondary | ICD-10-CM

## 2022-05-08 DIAGNOSIS — Z7952 Long term (current) use of systemic steroids: Secondary | ICD-10-CM | POA: Insufficient documentation

## 2022-05-08 DIAGNOSIS — Z923 Personal history of irradiation: Secondary | ICD-10-CM | POA: Insufficient documentation

## 2022-05-08 DIAGNOSIS — K5900 Constipation, unspecified: Secondary | ICD-10-CM | POA: Insufficient documentation

## 2022-05-08 DIAGNOSIS — C3412 Malignant neoplasm of upper lobe, left bronchus or lung: Secondary | ICD-10-CM | POA: Insufficient documentation

## 2022-05-08 DIAGNOSIS — Z79899 Other long term (current) drug therapy: Secondary | ICD-10-CM | POA: Insufficient documentation

## 2022-05-08 DIAGNOSIS — G893 Neoplasm related pain (acute) (chronic): Secondary | ICD-10-CM

## 2022-05-08 DIAGNOSIS — Z515 Encounter for palliative care: Secondary | ICD-10-CM

## 2022-05-08 DIAGNOSIS — C7951 Secondary malignant neoplasm of bone: Secondary | ICD-10-CM | POA: Insufficient documentation

## 2022-05-08 DIAGNOSIS — E279 Disorder of adrenal gland, unspecified: Secondary | ICD-10-CM | POA: Insufficient documentation

## 2022-05-08 NOTE — Progress Notes (Signed)
Therapist, nutritional Palliative Care Consult Note Telephone: [phone]  Fax: [phone]    Date of encounter: 05/08/22 9:40 AM PATIENT NAME: Christopher Dawson [address] [place] [address]   [phone] (home)  DOB: 06-20-1955 MRN: [id number] PRIMARY CARE PROVIDER:    Mliss Sax, MD,  [address] [place] [address] [phone]  REFERRING PROVIDER:   Mliss Sax, MD [address] [place],  [place] [address] [phone]  RESPONSIBLE PARTY:    Contact Information     Name Relation Home Work Mobile   Christopher Dawson Mother [phone]     Constance Haw   [phone]        I met face to face with patient in his assisted living facility. Palliative Care was asked to follow this patient by consultation request of  Mliss Sax,* to address advance care planning and complex medical decision making. This is a follow up visit.  ASSESSMENT , SYMPTOM MANAGEMENT AND PLAN / RECOMMENDATIONS:   Cancer related pain of low back/right hip pain with pressure ulcer Has received radiation to thoracic lesion. Continues on Fentanyl patch 25 mcg and OXY IR for breakthrough. Has Tylenol prn. Encourage repositioning q 2 hours and use of mepilex dressing over right hip site to help pad.  Pruritus of skin Question If medication related, no visible rash but multiple scabbed areas from scratching. Has Hydroxyzine 50 mg TID and encouraged emollient use in between, increase hydration.  Palliative Care Encounter Spoke with nursing to address pt's incompatibility with roommate and ask if there was an option to reassign one or the other to a different room.    Advance Care Planning/Goals of Care:  CODE STATUS: Limited Full Code without intubation    Follow up Palliative Care Visit: Palliative care will continue to follow for complex medical decision making, advance care planning, and clarification of  goals. Return 4 weeks or prn.    This visit was coded based on medical decision making (MDM).  PPS: 30%  HOSPICE ELIGIBILITY/DIAGNOSIS: TBD  Chief Complaint:  Acute visit requested per patient to discuss medications.  He c/o itching "all over" that keeps him awake at night.  HISTORY OF PRESENT ILLNESS:  Christopher Dawson is a 67 y.o. 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  Most recent abdominal CT in 01/2022 showed new right adrenal mass.  Currently he continues on oral chemotherapy agent Krazati.  He also has a hx of Marfan's syndrome,  COPD, chronic lumbar radiculopathy, thrombocytopenia, depression, hx of ETOH abuse, hypocalcemia, HLD, hyponatremia and constipation.  Pt c/o right hip pain and area that has reopened which had previously healed.  He reports poor tolerance of position changes, poor appetite and increased generalized itching. He states that the facility provider had increased his hydroxyzine for itching but he was not sure that he was receiving it.  He continues to have issues with constipation.  Denies CP, SOB, nausea, vomiting or falls. He has a new roommate who opens the window to get more air but causes pt to be very cold and he is bedbound, unable to change the situation. He has received SBRT radiation to T12 lesion with last dose given 04/06/22. Assisted pt to reposition to left and he became rigid making it difficult to turn.  Worked with aid to reposition pt somewhat on his left and he immediately rearranged back to right side.  History obtained  from review of EMR, discussion with facility staff and/or Christopher Dawson.      Latest Ref Rng & Units 03/27/2022    9:17 AM 03/10/2022    9:21 AM 01/17/2022   11:22 AM  CBC  WBC 4.0 - 10.5 K/uL 7.1  7.2  8.2   Hemoglobin 13.0 - 17.0 g/dL 10.0  10.6  10.3   Hematocrit 39.0 - 52.0 % 31.2  32.8  32.7   Platelets 150 - 400 K/uL 325  271  354        Latest Ref Rng & Units 03/27/2022    9:17 AM 03/10/2022    9:21 AM 01/17/2022   11:22 AM  CMP  Glucose 70 - 99 mg/dL 98  110  103   BUN 8 - 23 mg/dL 11  15  13    Creatinine 0.61 - 1.24 mg/dL 0.51  0.47  0.73   Sodium 135 - 145 mmol/L 137  137  139   Potassium 3.5 - 5.1 mmol/L 4.2  4.0  3.5   Chloride 98 - 111 mmol/L 103  106  106   CO2 22 - 32 mmol/L 27  28  32   Calcium 8.9 - 10.3 mg/dL 8.9  9.0  8.7   Total Protein 6.5 - 8.1 g/dL 6.4  6.4  6.2   Total Bilirubin 0.3 - 1.2 mg/dL 0.4  0.4  0.3   Alkaline Phos 38 - 126 U/L 64  67  52   AST 15 - 41 U/L 17  13  13    ALT 0 - 44 U/L 15  7  9        Latest Ref Rng & Units 03/27/2022    9:17 AM 03/10/2022    9:21 AM 01/17/2022   11:22 AM  Hepatic Function  Total Protein 6.5 - 8.1 g/dL 6.4  6.4  6.2   Albumin 3.5 - 5.0 g/dL 3.5  3.5  3.3   AST 15 - 41 U/L 17  13  13    ALT 0 - 44 U/L 15  7  9    Alk Phosphatase 38 - 126 U/L 64  67  52   Total Bilirubin 0.3 - 1.2 mg/dL 0.4  0.4  0.3   02/15/22 CT Chest, ABD/pelvis with contrast:  IMPRESSION: Chest Impression:   1. No evidence of lung cancer recurrence within the pulmonary parenchyma. 2. Stable small mediastinal hilar lymph nodes. 3. Stable skeletal metastasis in the thoracic spine   Abdomen / Pelvis Impression:   1. 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 2. Large stool ball in the rectum.  I reviewed available labs, medications, imaging, studies and related documents from the EMR.  Records reviewed and summarized above.   ROS General: NAD Cardiovascular: denies chest pain, denies DOE Pulmonary: denies cough, denies increased SOB Abdomen: endorses fair appetite, endorses constipation, endorses continence of bowel GU: continent of bladder without foley MSK:  denies increased weakness, no falls reported Skin:  Reports continued pruritus of skin--Question if related to pain meds Neurological: endorses pain in his low back, and right hip Psych: Endorses irritability  with roommate's behavior of turning off heat and opening the window which makes him cold   Physical Exam: Current and past weights: on 06/16/21 weight 165 lbs, current weight 158 lbs as of 10/19/21 Constitutional: NAD General: frail appearing, thin CV: S1S2, regular tachycardic rhythm, no LE edema Pulmonary: CTAB, no increased work of breathing, no cough, room air Abdomen: normo-active BS + 4 quadrants, soft and  non tender, no ascites GU:  foley cath in place draining medium tea colored urine MSK: noted sarcopenia of all extremities, cannot extend legs, bed or chairbound and has to have help for transfers Skin: warm and dry, pinpoint erythematous rash which has been scratched open, no other drainage noted.  Rash noted on trunk and BUE Neuro:  noted generalized weakness,  no cognitive impairment Psych: mildly anxious affect, A and O x 3 Hem/lymph/immuno: no widespread bruising   Thank you for the opportunity to participate in the care of Mr. Goyne.  The palliative care team will continue to follow. Please call our office at [phone] if we can be of additional assistance.   Lurline Idol, FNP -C  COVID-19 PATIENT SCREENING TOOL Asked and negative response unless otherwise noted:   Have you had symptoms of covid, tested positive or been in contact with someone with symptoms/positive test in the past 5-10 days?  no

## 2022-05-08 NOTE — Telephone Encounter (Signed)
Called [name] & spoke with Christopher Dawson about pt's missed appts.  He didn't show for his CT or appt today with Cassie NP.  Cassie had left him a message last week abut r/s his CT but didn't receive call back from pt.  Christopher Dawson states pt has declined & feels that he has given up & refused CT.  She will ask Pt about r/s CT & get back with [place].  She states that we should communicate with [name] instead of calling pt due to decline.  She reports he is alert & oriented.

## 2022-05-11 NOTE — Progress Notes (Signed)
                                                                                                                                                             Patient Name: Christopher Dawson MRN: [id number] DOB: 1955-10-31 Referring Physician: Si Gaul (Profile Not Attached) Date of Service: 04/06/2022 [facility], [place]                                                        End Of Treatment Note  Diagnoses: C79.51-Secondary malignant neoplasm of bone E27.8-Other specified disorders of adrenal gland  Cancer Staging:  67 y/o male with a new 2.8 cm metastatic lesion adjacent to the right adrenal gland secondary to metastatic NSCLC   Intent: Curative  Radiation Treatment Dates: 03/27/2022 through 04/06/2022 Site Technique Total Dose (Gy) Dose per Fx (Gy) Completed Fx Beam Energies  Adrenal Gland, Right: Kidney_R IMRT 50/50 10 5/5 6XFFF   Narrative: The patient tolerated radiation therapy relatively well with only modest fatigue.  Plan: The patient will receive a call in about one month from the radiation oncology department. He will continue follow up with his medical oncologist, Dr. Arbutus Ped as well.   ------------------------------------------------   Margaretmary Dys, MD [facility]  Radiation Oncology Direct Dial: [phone]  Fax: [phone] [email or web address]  Skype  LinkedIn

## 2022-05-11 NOTE — Progress Notes (Deleted)
[facility] OFFICE PROGRESS NOTE  Christopher Sax, MD [address] [place] [address]  DIAGNOSIS: Stage IV (T1c, N1, M1 C) non-small cell lung cancer, adenosquamous carcinoma presented with left upper lobe lung nodule in addition to left hilar adenopathy and bone metastasis diagnosed January 2023.   Molecular studies by foundation 1 showed positive KRAS G12C mutation   PD-L1 expression 10%  PRIOR THERAPY: 1) Palliative radiotherapy to the T12 destructive pathologic fracture under the care of Dr. Kathrynn Dawson completed on May 10, 2021. 2) Systemic chemotherapy with carboplatin for AUC of 5, Alimta 500 Mg/M2 and Keytruda 200 Mg IV every 3 weeks. Last dose on 08/08/21. Status post 3 cycles. Discontinued due to disease progression 3) 2) SBRT to adrenal lesion under the care of Dr. Kathrynn Dawson. Last dose expected on 12/14.   CURRENT THERAPY: 1) Krazati p.o. daily.  First dose Sep 07, 2021.  Status post 6.5 months of treatment.    INTERVAL HISTORY: Christopher Dawson 67 y.o. male returns today for follow-up visit.  The patient is currently a resident of a skilled nursing facility.  He is not ambulatory and comes to this appointment on a stretcher.  The patient is unable to ambulate due to weakness in his lower extremity secondary to thoracic and lumbar metastatic lesions with pathological and epidural extension.  This is his baseline.  The patient was last seen in clinic by me on 12/4/thousand 23.  On the patient's last restaging CT scan he had an enlarging adrenal lesion for which she completed SBRT to this area under the care of Dr. Kathrynn Dawson and his last treatment was on 12/14/thousand 23.  He is currently undergoing targeted systemic treatment with Caryn Section.  He has been tolerating this well without any concerning adverse side effects. Today, the patient denies any changes in his health. Denies any fever or chills.  He has some occasional night sweats around the collar  pressure which has been going on for "a while".  Denies any chest pain or hemoptysis.  He has not had dyspnea or cough.  Denies any nausea, vomiting, or diarrhea.  He reports baseline constipation for which he takes stool softeners and medicine provided by his facility.  He is supposed to have a restaging CT scan on 05/05/22 he did not show up to his appointment.  He Is here for evaluation and repeat blood work.     MEDICAL HISTORY: Past Medical History:  Diagnosis Date   Anxiety    Depression    Lumbar radiculopathy, chronic 04/24/2006   Marfan syndrome    Neurosis, depressive    nscl ca with bone mets 04/2021   Shortening, leg, congenital     ALLERGIES:  is allergic to codeine.  MEDICATIONS:  Current Outpatient Medications  Medication Sig Dispense Refill   acetaminophen (TYLENOL) 325 MG tablet Take 2 tablets (650 mg total) by mouth every 6 (six) hours as needed for mild pain (or Fever >/= 101). 30 tablet 0   adagrasib (KRAZATI) 200 MG tablet Take 3 tablets (600 mg) by mouth in the morning and at bedtime. 180 tablet 3   Amino Acids-Protein Hydrolys (PRO-STAT AWC) LIQD Take 30 mLs by mouth daily. Take 32ml by mouth daily     busPIRone (BUSPAR) 7.5 MG tablet Take 7.5 mg by mouth daily.     ciclopirox (LOPROX) 0.77 % SUSP Apply topically.     feeding supplement (ENSURE ENLIVE / ENSURE PLUS) LIQD Take 237 mLs by mouth 3 (three) times daily between  meals. 237 mL 12   fentaNYL (DURAGESIC) 12 MCG/HR Place 1 patch onto the skin every 3 (three) days.     ferrous sulfate 325 (65 FE) MG tablet Take 325 mg by mouth 2 (two) times daily.     folic acid (FOLVITE) 1 MG tablet Take 1 tablet (1 mg total) by mouth daily. 30 tablet 4   gabapentin (NEURONTIN) 100 MG capsule Take 1 capsule (100 mg total) by mouth 3 (three) times daily. (Patient not taking: Reported on 05/08/2022) 90 capsule 0   hydrocortisone cream 1 % Apply 1 Application topically 2 (two) times daily. To back twice daily for 1 week for itch.      hydrOXYzine (ATARAX) 50 MG tablet Take 50 mg by mouth 3 (three) times daily.     lactulose (CHRONULAC) 10 GM/15ML solution Take 45 mLs (30 g total) by mouth 2 (two) times daily. 236 mL 0   methylPREDNISolone (MEDROL DOSEPAK) 4 MG TBPK tablet Use as instructed 21 tablet 0   Multiple Vitamin (MULTIVITAMIN WITH MINERALS) TABS tablet Take 1 tablet by mouth daily. 30 tablet 0   ondansetron (ZOFRAN) 4 MG tablet Take 1 tablet (4 mg total) by mouth every 6 (six) hours as needed for nausea. 20 tablet 0   oxycodone (OXY-IR) 5 MG capsule Take 5 mg by mouth every 6 (six) hours as needed for pain (breakthrough pain). Take 1 tab by mouth every six hours for pain.     oxyCODONE (OXYCONTIN) 15 mg 12 hr tablet Take 15 mg by mouth every 12 (twelve) hours.     pantoprazole (PROTONIX) 40 MG tablet Take 40 mg by mouth daily.     polyethylene glycol (MIRALAX / GLYCOLAX) 17 g packet Take 17 g by mouth daily.     prochlorperazine (COMPAZINE) 10 MG tablet Take 1 tablet (10 mg total) by mouth every 6 (six) hours as needed for nausea or vomiting. 30 tablet 0   senna-docusate (SENOKOT-S) 8.6-50 MG tablet Take 1 tablet by mouth 2 (two) times daily. 30 tablet 0   thiamine 100 MG tablet Take 1 tablet (100 mg total) by mouth daily. 30 tablet 0   No current facility-administered medications for this visit.    SURGICAL HISTORY:  Past Surgical History:  Procedure Laterality Date   LEG SURGERY      REVIEW OF SYSTEMS:   Review of Systems  Constitutional: Negative for appetite change, chills, fatigue, fever and unexpected weight change.  HENT:   Negative for mouth sores, nosebleeds, sore throat and trouble swallowing.   Eyes: Negative for eye problems and icterus.  Respiratory: Negative for cough, hemoptysis, shortness of breath and wheezing.   Cardiovascular: Negative for chest pain and leg swelling.  Gastrointestinal: Negative for abdominal pain, constipation, diarrhea, nausea and vomiting.  Genitourinary: Negative  for bladder incontinence, difficulty urinating, dysuria, frequency and hematuria.   Musculoskeletal: Negative for back pain, gait problem, neck pain and neck stiffness.  Skin: Negative for itching and rash.  Neurological: Negative for dizziness, extremity weakness, gait problem, headaches, light-headedness and seizures.  Hematological: Negative for adenopathy. Does not bruise/bleed easily.  Psychiatric/Behavioral: Negative for confusion, depression and sleep disturbance. The patient is not nervous/anxious.     PHYSICAL EXAMINATION:  There were no vitals taken for this visit.  ECOG PERFORMANCE STATUS: {CHL ONC ECOG [id number]  Physical Exam  Constitutional: Oriented to person, place, and time and well-developed, well-nourished, and in no distress. No distress.  HENT:  Head: Normocephalic and atraumatic.  Mouth/Throat: Oropharynx is clear and  moist. No oropharyngeal exudate.  Eyes: Conjunctivae are normal. Right eye exhibits no discharge. Left eye exhibits no discharge. No scleral icterus.  Neck: Normal range of motion. Neck supple.  Cardiovascular: Normal rate, regular rhythm, normal heart sounds and intact distal pulses.   Pulmonary/Chest: Effort normal and breath sounds normal. No respiratory distress. No wheezes. No rales.  Abdominal: Soft. Bowel sounds are normal. Exhibits no distension and no mass. There is no tenderness.  Musculoskeletal: Normal range of motion. Exhibits no edema.  Lymphadenopathy:    No cervical adenopathy.  Neurological: Alert and oriented to person, place, and time. Exhibits normal muscle tone. Gait normal. Coordination normal.  Skin: Skin is warm and dry. No rash noted. Not diaphoretic. No erythema. No pallor.  Psychiatric: Mood, memory and judgment normal.  Vitals reviewed.  LABORATORY DATA: Lab Results  Component Value Date   WBC 7.1 03/27/2022   HGB 10.0 (L) 03/27/2022   HCT 31.2 (L) 03/27/2022   MCV 81.7 03/27/2022   PLT 325 03/27/2022       Chemistry      Component Value Date/Time   NA 137 03/27/2022 0917   NA 146 (H) 07/04/2018 1642   K 4.2 03/27/2022 0917   CL 103 03/27/2022 0917   CO2 27 03/27/2022 0917   BUN 11 03/27/2022 0917   BUN 13 07/04/2018 1642   CREATININE 0.51 (L) 03/27/2022 0917   CREATININE 0.95 07/04/2012 1534      Component Value Date/Time   CALCIUM 8.9 03/27/2022 0917   ALKPHOS 64 03/27/2022 0917   AST 17 03/27/2022 0917   ALT 15 03/27/2022 0917   BILITOT 0.4 03/27/2022 0917       RADIOGRAPHIC STUDIES:  No results found.   ASSESSMENT/PLAN:  This is a very pleasant 67 year old Caucasian male with stage IV non-small cell lung cancer, adenosquamous carcinoma.  The patient presented with a left upper lobe spiculated nodule with peripheral stranding and osseous metastatic disease, some of which involves epidural extension.  The patient was diagnosed in January 2023.  The patient's molecular studies show he is positive for K-ras G12 C mutation which may use in the second line setting. His PDL1 expression ins negative.    The patient completed palliative radiation to the T12 lesion under the care of Dr. Tammi Klippel on 05/10/21.    He initially started palliative systemic chemotherapy with carboplatin for AUC of 5, Alimta 500 Mg/M2 and Keytruda 200 Mg IV every 3 weeks.  First cycle June 30, 2021.  Status post 3 cycles.  The patient tolerated the second cycle of his treatment fairly well except for fatigue. Unfortunately, the patient had been in such a disease progression with progression of the left upper lobe lung lesion, left hilar and subcarinal adenopathy.  He also had new sclerotic lesion at T1.  herefore, the patient started targeted treatment with [place].  He started this in May 2023.  He has been on this for approximately 6.5 months.  He is tolerating this well without any concerning adverse side effects, he is having significant itching for the last several months.  Unclear if this is related.   The  patient was recently found to have enlarging adrenal lesion.  Completed SBRT to this lesion under the care of Dr. Tammi Klippel which his last treatment was on 04/06/2022.  The patient was post have a restaging CT scan performed but he missed his appointment.  The patient was seen with Dr. Julien Nordmann today.  Labs were reviewed.  Recommend he continue on the  same treatment at the same dose although we did advise the patient to reschedule his CT scan at his earliest convenience we will have to call him with the results.  The patient was advised to call immediately if she has any concerning symptoms in the interval. The patient voices understanding of current disease status and treatment options and is in agreement with the current care plan. All questions were answered. The patient knows to call the clinic with any problems, questions or concerns. We can certainly see the patient much sooner if necessary   No orders of the defined types were placed in this encounter.    I spent {CHL ONC TIME VISIT - NIWUS:[id number]} counseling the patient face to face. The total time spent in the appointment was {CHL ONC TIME VISIT - TPFTF:[id number]}.  Tenaya Hilyer L Celestine Prim, PA-C 05/11/22

## 2022-05-12 ENCOUNTER — Other Ambulatory Visit (HOSPITAL_COMMUNITY): Payer: Self-pay

## 2022-05-15 ENCOUNTER — Inpatient Hospital Stay: Payer: Medicare Other | Admitting: Physician Assistant

## 2022-05-15 ENCOUNTER — Inpatient Hospital Stay: Payer: Medicare Other

## 2022-05-15 ENCOUNTER — Telehealth: Payer: Self-pay

## 2022-05-15 NOTE — Telephone Encounter (Signed)
Called [facility] to speak with patient regarding missed appointments today. Patient stated something went wrong with EMS transportation because they never came to pick him up today. This LPN stated that scheduling should be reaching out to him to reschedule appointments and also that a voicemail was left regarding scheduling a CT scan and the # [phone] was left on the voicemail for him. He verbalized understanding.

## 2022-05-15 NOTE — Progress Notes (Signed)
  Radiation Oncology         [phone] ________________________________  Name: Christopher Dawson MRN: [id number]  Date of Service: 05/16/2022  DOB: Jun 29, 1955  Post Treatment Telephone Note  Diagnosis:  67 y/o male with a new 2.8 cm metastatic lesion adjacent to the right adrenal gland secondary to metastatic NSCLC   Intent: Curative  Radiation Treatment Dates: 03/27/2022 through 04/06/2022 Site Technique Total Dose (Gy) Dose per Fx (Gy) Completed Fx Beam Energies  Adrenal Gland, Right: Kidney_R IMRT 50/50 10 5/5 6XFFF  (as documented in provider EOT note)   The patient was not available for call today. A detailed voicemail was left.  The patient has NOT scheduled a follow up with his medical oncologist Dr. Arbutus Ped for ongoing surveillance, and was encouraged to call if he develops concerns or questions regarding radiation.   [name], LPN

## 2022-05-16 ENCOUNTER — Ambulatory Visit
Admission: RE | Admit: 2022-05-16 | Discharge: 2022-05-16 | Disposition: A | Discharge status: Home / Self Care | Payer: Medicare Other | Source: Ambulatory Visit | Attending: Radiation Oncology | Admitting: Radiation Oncology

## 2022-05-16 NOTE — Progress Notes (Unsigned)
[facility] OFFICE PROGRESS NOTE  Mliss Sax, MD [address] [place] [address]  DIAGNOSIS: Stage IV (T1c, N1, M1 C) non-small cell lung cancer, adenosquamous carcinoma presented with left upper lobe lung nodule in addition to left hilar adenopathy and bone metastasis diagnosed January 2023.   Molecular studies by foundation 1 showed positive KRAS G12C mutation   PD-L1 expression 10%  PRIOR THERAPY: 1) Palliative radiotherapy to the T12 destructive pathologic fracture under the care of Dr. Kathrynn Running completed on May 10, 2021. 2) Systemic chemotherapy with carboplatin for AUC of 5, Alimta 500 Mg/M2 and Keytruda 200 Mg IV every 3 weeks. Last dose on 08/08/21. Status post 3 cycles. Discontinued due to disease progression 3) SBRT to adrenal lesion under the care of Dr. Kathrynn Running. Last dose expected on 12/14.   CURRENT THERAPY: 1) Krazati p.o. daily.  First dose Sep 07, 2021.  Status post 6.5 months of treatment.    INTERVAL HISTORY: Christopher Dawson 67 y.o. male returns to the clinic today for a follow-up visit.  The patient is currently a resident of a skilled nursing facility.  He is not ambulatory and comes to this appointment on a stretcher.  The patient is unable to ambulate due to weakness in his lower extremity secondary to thoracic and lumbar metastatic lesions with pathological and epidural extension.  This is his baseline.  The patient was last seen in clinic by me on 03/27/22.  On the patient's last restaging CT scan he had an enlarging adrenal lesion for which he completed SBRT to this area under the care of Dr. Kathrynn Running and his last treatment was on 04/06/22 per chart review. The patient does not recall if he completed this or not.   He had missed several appointments this month. One of my nurses spoke to a member of his SNF who stated the patient declined and feels like he has given up and refused the CT. That would be unusual for this patient. I  talked to the patient today who states that was not the case. He states he did not know about his appointments and nobody had coordinated his appointments.    He is currently undergoing targeted systemic treatment with Caryn Section.  He has been tolerating this well without any concerning adverse side effects. Today, the patient denies any changes in his health. Denies any fever or chills.  Denies any chest pain or hemoptysis.  He has not had dyspnea or cough. He states he had a dream about 1 month ago where he could not breath well.  Denies any nausea, vomiting, or diarrhea.  He reports baseline constipation for which he takes stool softeners and medicine provided by his facility.  He is supposed to have a restaging CT scan on 05/05/22 he did not show up to his appointment.  He Is here for evaluation and repeat blood work.    MEDICAL HISTORY: Past Medical History:  Diagnosis Date   Anxiety    Depression    Lumbar radiculopathy, chronic 04/24/2006   Marfan syndrome    Neurosis, depressive    nscl ca with bone mets 04/2021   Shortening, leg, congenital     ALLERGIES:  is allergic to codeine.  MEDICATIONS:  Current Outpatient Medications  Medication Sig Dispense Refill   acetaminophen (TYLENOL) 325 MG tablet Take 2 tablets (650 mg total) by mouth every 6 (six) hours as needed for mild pain (or Fever >/= 101). 30 tablet 0   adagrasib (KRAZATI) 200 MG  tablet Take 3 tablets (600 mg) by mouth in the morning and at bedtime. 180 tablet 3   Amino Acids-Protein Hydrolys (PRO-STAT AWC) LIQD Take 30 mLs by mouth daily. Take 74ml by mouth daily     busPIRone (BUSPAR) 7.5 MG tablet Take 7.5 mg by mouth daily.     ciclopirox (LOPROX) 0.77 % SUSP Apply topically.     feeding supplement (ENSURE ENLIVE / ENSURE PLUS) LIQD Take 237 mLs by mouth 3 (three) times daily between meals. 237 mL 12   fentaNYL (DURAGESIC) 12 MCG/HR Place 1 patch onto the skin every 3 (three) days.     ferrous sulfate 325 (65 FE) MG tablet  Take 325 mg by mouth 2 (two) times daily.     folic acid (FOLVITE) 1 MG tablet Take 1 tablet (1 mg total) by mouth daily. 30 tablet 4   gabapentin (NEURONTIN) 100 MG capsule Take 1 capsule (100 mg total) by mouth 3 (three) times daily. (Patient not taking: Reported on 05/08/2022) 90 capsule 0   hydrocortisone cream 1 % Apply 1 Application topically 2 (two) times daily. To back twice daily for 1 week for itch.     hydrOXYzine (ATARAX) 50 MG tablet Take 50 mg by mouth 3 (three) times daily.     lactulose (CHRONULAC) 10 GM/15ML solution Take 45 mLs (30 g total) by mouth 2 (two) times daily. 236 mL 0   methylPREDNISolone (MEDROL DOSEPAK) 4 MG TBPK tablet Use as instructed 21 tablet 0   Multiple Vitamin (MULTIVITAMIN WITH MINERALS) TABS tablet Take 1 tablet by mouth daily. 30 tablet 0   ondansetron (ZOFRAN) 4 MG tablet Take 1 tablet (4 mg total) by mouth every 6 (six) hours as needed for nausea. 20 tablet 0   oxycodone (OXY-IR) 5 MG capsule Take 5 mg by mouth every 6 (six) hours as needed for pain (breakthrough pain). Take 1 tab by mouth every six hours for pain.     oxyCODONE (OXYCONTIN) 15 mg 12 hr tablet Take 15 mg by mouth every 12 (twelve) hours.     pantoprazole (PROTONIX) 40 MG tablet Take 40 mg by mouth daily.     polyethylene glycol (MIRALAX / GLYCOLAX) 17 g packet Take 17 g by mouth daily.     prochlorperazine (COMPAZINE) 10 MG tablet Take 1 tablet (10 mg total) by mouth every 6 (six) hours as needed for nausea or vomiting. 30 tablet 0   senna-docusate (SENOKOT-S) 8.6-50 MG tablet Take 1 tablet by mouth 2 (two) times daily. 30 tablet 0   thiamine 100 MG tablet Take 1 tablet (100 mg total) by mouth daily. 30 tablet 0   No current facility-administered medications for this visit.    SURGICAL HISTORY:  Past Surgical History:  Procedure Laterality Date   LEG SURGERY      REVIEW OF SYSTEMS:   Constitutional: Negative for appetite change, chills, fatigue, fever and unexpected weight change.   HENT: Negative for mouth sores, nosebleeds, sore throat and trouble swallowing.   Eyes: Negative for eye problems and icterus.  Respiratory: Negative for cough, hemoptysis, shortness of breath and wheezing.   Cardiovascular: Negative for chest pain and leg swelling.  Gastrointestinal: Negative for abdominal pain, diarrhea, nausea and vomiting. Constipation, lack of appetite Genitourinary: Negative for bladder incontinence, difficulty urinating, dysuria, frequency and hematuria.   Musculoskeletal: Negative for back pain, neck pain and neck stiffness. Arrives on stretcher  Skin: Negative for itching and rash.  Neurological: Negative for dizziness, extremity weakness, headaches, light-headedness and seizures.  Hematological: Negative for adenopathy. Does not bruise/bleed easily.  Psychiatric/Behavioral: Negative for confusion, depression and sleep disturbance. The patient is not nervous/anxious.   PHYSICAL EXAMINATION:  Blood pressure 94/64, pulse 84, temperature 98 F (36.7 C), resp. rate 18, SpO2 96 %.  ECOG PERFORMANCE STATUS: 4 - Bedbound  Physical Exam  Constitutional: Oriented to person, place, and time and well-developed, well-nourished, and in no distress.  HENT:  Head: Normocephalic and atraumatic.  Mouth/Throat: Oropharynx is clear and moist. No oropharyngeal exudate.  Eyes: Conjunctivae are normal. Right eye exhibits no discharge. Left eye exhibits no discharge. No scleral icterus.  Neck: Normal range of motion. Neck supple.  Cardiovascular:  heart sounds Distant  Pulmonary/Chest: Effort normal and breath sounds normal. No respiratory distress. No wheezes. No rales.  Abdominal: Exhibits no distension  Musculoskeletal: Exhibits no edema. On stretcher  Lymphadenopathy:    No cervical adenopathy observed.  Neurological: Alert and oriented to person, place, and time.  Skin: Skin is warm and dry. No rash noted. Not diaphoretic. No erythema. No pallor.  Neurological: Alert and  oriented to person, place, and time. Exhibits muscle wasting. Examined on the stretcher.   Psychiatric: Mood, memory and judgment normal.  Vitals reviewed.  LABORATORY DATA: Lab Results  Component Value Date   WBC 6.3 05/18/2022   HGB 10.2 (L) 05/18/2022   HCT 31.4 (L) 05/18/2022   MCV 78.9 (L) 05/18/2022   PLT 281 05/18/2022      Chemistry      Component Value Date/Time   NA 138 05/18/2022 0940   NA 146 (H) 07/04/2018 1642   K 4.1 05/18/2022 0940   CL 104 05/18/2022 0940   CO2 30 05/18/2022 0940   BUN 15 05/18/2022 0940   BUN 13 07/04/2018 1642   CREATININE 0.56 (L) 05/18/2022 0940   CREATININE 0.95 07/04/2012 1534      Component Value Date/Time   CALCIUM 9.1 05/18/2022 0940   ALKPHOS 75 05/18/2022 0940   AST 14 (L) 05/18/2022 0940   ALT 8 05/18/2022 0940   BILITOT 0.5 05/18/2022 0940       RADIOGRAPHIC STUDIES:  No results found.   ASSESSMENT/PLAN:  This is a very pleasant 67 year old Caucasian male with stage IV non-small cell lung cancer, adenosquamous carcinoma.  The patient presented with a left upper lobe spiculated nodule with peripheral stranding and osseous metastatic disease, some of which involves epidural extension.  The patient was diagnosed in January 2023.  The patient's molecular studies show he is positive for K-ras G12 C mutation which may use in the second line setting. His PDL1 expression ins negative.    The patient completed palliative radiation to the T12 lesion under the care of Dr. Tammi Klippel on 05/10/21.    He initially started palliative systemic chemotherapy with carboplatin for AUC of 5, Alimta 500 Mg/M2 and Keytruda 200 Mg IV every 3 weeks.  First cycle June 30, 2021.  Status post 3 cycles.  The patient tolerated the second cycle of his treatment fairly well except for fatigue. Unfortunately, the patient had been in such a disease progression with progression of the left upper lobe lung lesion, left hilar and subcarinal adenopathy.  He also  had new sclerotic lesion at T1.  Therefore, the patient started targeted treatment with [place].  He started this in May 2023.  He has been on this for approximately 8 months.     The patient was recently found to have enlarging adrenal lesion.  Completed SBRT to this lesion under the  care of Dr. Kathrynn Running which his last treatment was on 04/06/2022.  The patient was supposed have a restaging CT scan performed but he missed his appointment.  Labs were reviewed.  Recommend he continue on the same treatment at the same dose for now but we have scheduled his restaging CT scan for 2/6 and arranged follow up on 2/8. I printed him a copy of these appointments to share with his SNF to help arrange transportation.   The patient was advised to call immediately if he has any concerning symptoms in the interval. The patient voices understanding of current disease status and treatment options and is in agreement with the current care plan. All questions were answered. The patient knows to call the clinic with any problems, questions or concerns. We can certainly see the patient much sooner if necessary   Orders Placed This Encounter  Procedures   CBC with Differential (Cancer Center Only)    Standing Status:   Future    Standing Expiration Date:   05/19/2023   CMP (Cancer Center only)    Standing Status:   Future    Standing Expiration Date:   05/19/2023     The total time spent in the appointment was 20-29 minutes.   Reneka Nebergall L Eyal Greenhaw, PA-C 05/18/22

## 2022-05-17 ENCOUNTER — Ambulatory Visit: Payer: Medicare Other | Admitting: Physician Assistant

## 2022-05-17 ENCOUNTER — Other Ambulatory Visit: Payer: Medicare Other

## 2022-05-17 ENCOUNTER — Telehealth: Payer: Self-pay | Admitting: Internal Medicine

## 2022-05-17 NOTE — Telephone Encounter (Signed)
Patient called to confirm appointment and to make sure he had transportation set up for the appointment. Forwarded patient information to transportation liaison.

## 2022-05-18 ENCOUNTER — Inpatient Hospital Stay (HOSPITAL_BASED_OUTPATIENT_CLINIC_OR_DEPARTMENT_OTHER): Payer: Medicare Other | Admitting: Physician Assistant

## 2022-05-18 ENCOUNTER — Inpatient Hospital Stay: Payer: Medicare Other

## 2022-05-18 VITALS — BP 94/64 | HR 84 | Temp 98.0°F | Resp 18

## 2022-05-18 DIAGNOSIS — Z7952 Long term (current) use of systemic steroids: Secondary | ICD-10-CM | POA: Diagnosis not present

## 2022-05-18 DIAGNOSIS — K5900 Constipation, unspecified: Secondary | ICD-10-CM | POA: Diagnosis not present

## 2022-05-18 DIAGNOSIS — C3412 Malignant neoplasm of upper lobe, left bronchus or lung: Secondary | ICD-10-CM | POA: Diagnosis present

## 2022-05-18 DIAGNOSIS — C7951 Secondary malignant neoplasm of bone: Secondary | ICD-10-CM | POA: Diagnosis not present

## 2022-05-18 DIAGNOSIS — C3492 Malignant neoplasm of unspecified part of left bronchus or lung: Secondary | ICD-10-CM

## 2022-05-18 DIAGNOSIS — E279 Disorder of adrenal gland, unspecified: Secondary | ICD-10-CM | POA: Diagnosis not present

## 2022-05-18 DIAGNOSIS — Z79899 Other long term (current) drug therapy: Secondary | ICD-10-CM | POA: Diagnosis not present

## 2022-05-18 DIAGNOSIS — Z923 Personal history of irradiation: Secondary | ICD-10-CM | POA: Diagnosis not present

## 2022-05-18 LAB — CMP (CANCER CENTER ONLY)
ALT: 8 U/L (ref 0–44)
AST: 14 U/L — ABNORMAL LOW (ref 15–41)
Albumin: 3.3 g/dL — ABNORMAL LOW (ref 3.5–5.0)
Alkaline Phosphatase: 75 U/L (ref 38–126)
Anion gap: 4 — ABNORMAL LOW (ref 5–15)
BUN: 15 mg/dL (ref 8–23)
CO2: 30 mmol/L (ref 22–32)
Calcium: 9.1 mg/dL (ref 8.9–10.3)
Chloride: 104 mmol/L (ref 98–111)
Creatinine: 0.56 mg/dL — ABNORMAL LOW (ref 0.61–1.24)
GFR, Estimated: >60 mL/min (ref >60)
Glucose, Bld: 79 mg/dL (ref 70–99)
Potassium: 4.1 mmol/L (ref 3.5–5.1)
Sodium: 138 mmol/L (ref 135–145)
Total Bilirubin: 0.5 mg/dL (ref 0.3–1.2)
Total Protein: 6.2 g/dL — ABNORMAL LOW (ref 6.5–8.1)

## 2022-05-18 LAB — CBC WITH DIFFERENTIAL (CANCER CENTER ONLY)
Abs Immature Granulocytes: 0.05 10*3/uL (ref 0.00–0.07)
Basophils Absolute: 0 10*3/uL (ref 0.0–0.1)
Basophils Relative: 1 %
Eosinophils Absolute: 0.6 10*3/uL — ABNORMAL HIGH (ref 0.0–0.5)
Eosinophils Relative: 10 %
HCT: 31.4 % — ABNORMAL LOW (ref 39.0–52.0)
Hemoglobin: 10.2 g/dL — ABNORMAL LOW (ref 13.0–17.0)
Immature Granulocytes: 1 %
Lymphocytes Relative: 8 %
Lymphs Abs: 0.5 10*3/uL — ABNORMAL LOW (ref 0.7–4.0)
MCH: 25.6 pg — ABNORMAL LOW (ref 26.0–34.0)
MCHC: 32.5 g/dL (ref 30.0–36.0)
MCV: 78.9 fL — ABNORMAL LOW (ref 80.0–100.0)
Monocytes Absolute: 0.9 10*3/uL (ref 0.1–1.0)
Monocytes Relative: 15 %
Neutro Abs: 4.2 10*3/uL (ref 1.7–7.7)
Neutrophils Relative %: 65 %
Platelet Count: 281 10*3/uL (ref 150–400)
RBC: 3.98 MIL/uL — ABNORMAL LOW (ref 4.22–5.81)
RDW: 16.8 % — ABNORMAL HIGH (ref 11.5–15.5)
WBC Count: 6.3 10*3/uL (ref 4.0–10.5)
nRBC: 0 % (ref 0.0–0.2)

## 2022-05-19 DIAGNOSIS — Z515 Encounter for palliative care: Secondary | ICD-10-CM | POA: Insufficient documentation

## 2022-05-22 ENCOUNTER — Other Ambulatory Visit (HOSPITAL_COMMUNITY): Payer: Self-pay

## 2022-05-29 NOTE — Progress Notes (Deleted)
[facility] OFFICE PROGRESS NOTE  [name], MD [place] [address]  DIAGNOSIS: Stage IV (T1c, N1, M1 C) non-small cell lung cancer, adenosquamous carcinoma presented with left upper lobe lung nodule in addition to left hilar adenopathy and bone metastasis diagnosed January 2023.   Molecular studies by foundation 1 showed positive KRAS G12C mutation  PD-L1 expression 10%   PRIOR THERAPY: 1) Palliative radiotherapy to the T12 destructive pathologic fracture under the care of Dr. Tammi Klippel completed on May 10, 2021. 2) Systemic chemotherapy with carboplatin for AUC of 5, Alimta 500 Mg/M2 and Keytruda 200 Mg IV every 3 weeks. Last dose on 08/08/21. Status post 3 cycles. Discontinued due to disease progression 3) SBRT to adrenal lesion under the care of Dr. Tammi Klippel. Last dose expected on 12/14.   CURRENT THERAPY: 1) Krazati p.o. daily.  First dose Sep 07, 2021.  Status post *** months of treatment.     INTERVAL HISTORY: Christopher Dawson 67 y.o. male returns to the clinic today for a follow-up visit.  The patient is currently a resident of a skilled nursing facility.  He is not ambulatory and comes to this appointment on a stretcher.  The patient is unable to ambulate due to weakness in his lower extremity secondary to thoracic and lumbar metastatic lesions with pathological and epidural extension.  This is his baseline.    The patient was last seen in clinic by myself on 05/18/22. He was supposed to have a restaging CT scan at this time but there sometimes is some breakdown in communication between his SNF arranging transportation to his appointments. His scan was rescheduled and completed on 05/30/22.   He is currently undergoing targeted systemic treatment with [name].  He has been tolerating this well without any concerning adverse side effects. Today, the patient denies any changes in his health. Denies any fever or chills.  Denies any chest  pain or hemoptysis.  He has not had dyspnea or cough.Shortness of breath. Denies any nausea, vomiting, or diarrhea.  He reports baseline constipation for which he takes stool softeners and medicine provided by his facility.   He Is here for evaluation and to review his scan results.    MEDICAL HISTORY: Past Medical History:  Diagnosis Date   Anxiety    Depression    Lumbar radiculopathy, chronic 04/24/2006   Marfan syndrome    Neurosis, depressive    nscl ca with bone mets 04/2021   Shortening, leg, congenital     ALLERGIES:  is allergic to codeine.  MEDICATIONS:  Current Outpatient Medications  Medication Sig Dispense Refill   acetaminophen (TYLENOL) 325 MG tablet Take 2 tablets (650 mg total) by mouth every 6 (six) hours as needed for mild pain (or Fever >/= 101). 30 tablet 0   adagrasib (KRAZATI) 200 MG tablet Take 3 tablets (600 mg) by mouth in the morning and at bedtime. 180 tablet 3   Amino Acids-Protein Hydrolys (PRO-STAT AWC) LIQD Take 30 mLs by mouth daily. Take 3ml by mouth daily     busPIRone (BUSPAR) 7.5 MG tablet Take 7.5 mg by mouth daily.     ciclopirox (LOPROX) 0.77 % SUSP Apply topically.     feeding supplement (ENSURE ENLIVE / ENSURE PLUS) LIQD Take 237 mLs by mouth 3 (three) times daily between meals. 237 mL 12   fentaNYL (DURAGESIC) 12 MCG/HR Place 1 patch onto the skin every 3 (three) days.     ferrous sulfate 325 (65 FE) MG  tablet Take 325 mg by mouth 2 (two) times daily.     folic acid (FOLVITE) 1 MG tablet Take 1 tablet (1 mg total) by mouth daily. 30 tablet 4   gabapentin (NEURONTIN) 100 MG capsule Take 1 capsule (100 mg total) by mouth 3 (three) times daily. (Patient not taking: Reported on 05/08/2022) 90 capsule 0   hydrocortisone cream 1 % Apply 1 Application topically 2 (two) times daily. To back twice daily for 1 week for itch.     hydrOXYzine (ATARAX) 50 MG tablet Take 50 mg by mouth 3 (three) times daily.     lactulose (CHRONULAC) 10 GM/15ML solution  Take 45 mLs (30 g total) by mouth 2 (two) times daily. 236 mL 0   methylPREDNISolone (MEDROL DOSEPAK) 4 MG TBPK tablet Use as instructed 21 tablet 0   Multiple Vitamin (MULTIVITAMIN WITH MINERALS) TABS tablet Take 1 tablet by mouth daily. 30 tablet 0   ondansetron (ZOFRAN) 4 MG tablet Take 1 tablet (4 mg total) by mouth every 6 (six) hours as needed for nausea. 20 tablet 0   oxycodone (OXY-IR) 5 MG capsule Take 5 mg by mouth every 6 (six) hours as needed for pain (breakthrough pain). Take 1 tab by mouth every six hours for pain.     oxyCODONE (OXYCONTIN) 15 mg 12 hr tablet Take 15 mg by mouth every 12 (twelve) hours.     pantoprazole (PROTONIX) 40 MG tablet Take 40 mg by mouth daily.     polyethylene glycol (MIRALAX / GLYCOLAX) 17 g packet Take 17 g by mouth daily.     prochlorperazine (COMPAZINE) 10 MG tablet Take 1 tablet (10 mg total) by mouth every 6 (six) hours as needed for nausea or vomiting. 30 tablet 0   senna-docusate (SENOKOT-S) 8.6-50 MG tablet Take 1 tablet by mouth 2 (two) times daily. 30 tablet 0   thiamine 100 MG tablet Take 1 tablet (100 mg total) by mouth daily. 30 tablet 0   No current facility-administered medications for this visit.    SURGICAL HISTORY:  Past Surgical History:  Procedure Laterality Date   LEG SURGERY      REVIEW OF SYSTEMS:   Review of Systems  Constitutional: Negative for appetite change, chills, fatigue, fever and unexpected weight change.  HENT:   Negative for mouth sores, nosebleeds, sore throat and trouble swallowing.   Eyes: Negative for eye problems and icterus.  Respiratory: Negative for cough, hemoptysis, shortness of breath and wheezing.   Cardiovascular: Negative for chest pain and leg swelling.  Gastrointestinal: Negative for abdominal pain, constipation, diarrhea, nausea and vomiting.  Genitourinary: Negative for bladder incontinence, difficulty urinating, dysuria, frequency and hematuria.   Musculoskeletal: Negative for back pain, gait  problem, neck pain and neck stiffness.  Skin: Negative for itching and rash.  Neurological: Negative for dizziness, extremity weakness, gait problem, headaches, light-headedness and seizures.  Hematological: Negative for adenopathy. Does not bruise/bleed easily.  Psychiatric/Behavioral: Negative for confusion, depression and sleep disturbance. The patient is not nervous/anxious.     PHYSICAL EXAMINATION:  There were no vitals taken for this visit.  ECOG PERFORMANCE STATUS: {CHL ONC ECOG [id number]  Physical Exam  Constitutional: Oriented to person, place, and time and well-developed, well-nourished, and in no distress. No distress.  HENT:  Head: Normocephalic and atraumatic.  Mouth/Throat: Oropharynx is clear and moist. No oropharyngeal exudate.  Eyes: Conjunctivae are normal. Right eye exhibits no discharge. Left eye exhibits no discharge. No scleral icterus.  Neck: Normal range of motion. Neck supple.  Cardiovascular: Normal rate, regular rhythm, normal heart sounds and intact distal pulses.   Pulmonary/Chest: Effort normal and breath sounds normal. No respiratory distress. No wheezes. No rales.  Abdominal: Soft. Bowel sounds are normal. Exhibits no distension and no mass. There is no tenderness.  Musculoskeletal: Normal range of motion. Exhibits no edema.  Lymphadenopathy:    No cervical adenopathy.  Neurological: Alert and oriented to person, place, and time. Exhibits normal muscle tone. Gait normal. Coordination normal.  Skin: Skin is warm and dry. No rash noted. Not diaphoretic. No erythema. No pallor.  Psychiatric: Mood, memory and judgment normal.  Vitals reviewed.  LABORATORY DATA: Lab Results  Component Value Date   WBC 6.3 05/18/2022   HGB 10.2 (L) 05/18/2022   HCT 31.4 (L) 05/18/2022   MCV 78.9 (L) 05/18/2022   PLT 281 05/18/2022      Chemistry      Component Value Date/Time   NA 138 05/18/2022 0940   NA 146 (H) 07/04/2018 1642   K 4.1 05/18/2022 0940    CL 104 05/18/2022 0940   CO2 30 05/18/2022 0940   BUN 15 05/18/2022 0940   BUN 13 07/04/2018 1642   CREATININE 0.56 (L) 05/18/2022 0940   CREATININE 0.95 07/04/2012 1534      Component Value Date/Time   CALCIUM 9.1 05/18/2022 0940   ALKPHOS 75 05/18/2022 0940   AST 14 (L) 05/18/2022 0940   ALT 8 05/18/2022 0940   BILITOT 0.5 05/18/2022 0940       RADIOGRAPHIC STUDIES:  No results found.   ASSESSMENT/PLAN:  This is a very pleasant 67 year old Caucasian male with stage IV non-small cell lung cancer, adenosquamous carcinoma.  The patient presented with a left upper lobe spiculated nodule with peripheral stranding and osseous metastatic disease, some of which involves epidural extension.  The patient was diagnosed in January 2023.  The patient's molecular studies show he is positive for K-ras G12 C mutation which may use in the second line setting. His PDL1 expression ins negative.    The patient completed palliative radiation to the T12 lesion under the care of Dr. Tammi Klippel on 05/10/21.   He initially started palliative systemic chemotherapy with carboplatin for AUC of 5, Alimta 500 Mg/M2 and Keytruda 200 Mg IV every 3 weeks.  First cycle June 30, 2021.  Status post 3 cycles.  The patient tolerated the second cycle of his treatment fairly well except for fatigue. Unfortunately, the patient had been in such a disease progression with progression of the left upper lobe lung lesion, left hilar and subcarinal adenopathy.  He also had new sclerotic lesion at T1.   Therefore, the patient started targeted treatment with [place].  He started this in May 2023.  He has been on this for approximately *** months.      The patient was recently found to have enlarging adrenal lesion. Completed SBRT to this lesion under the care of Dr. Tammi Klippel which his last treatment was on 04/06/2022.   The patient recently had a restaging CT scan. Dr. Julien Nordmann personally and independently reviewed the scan and  discussed the results with the patient today. The scan showed ***.   Dr. Julien Nordmann recommends ***  The patient was advised to call immediately if he has any concerning symptoms in the interval. The patient voices understanding of current disease status and treatment options and is in agreement with the current care plan. All questions were answered. The patient knows to call the clinic with any problems, questions or concerns.  We can certainly see the patient much sooner if necessary      No orders of the defined types were placed in this encounter.    I spent {CHL ONC TIME VISIT - ZDGUY:[id number]} counseling the patient face to face. The total time spent in the appointment was {CHL ONC TIME VISIT - GLOVF:[id number]}.  Nicco Reaume L Charnika Herbst, PA-C 05/29/22

## 2022-05-30 ENCOUNTER — Ambulatory Visit (HOSPITAL_COMMUNITY): Payer: Medicare Other

## 2022-05-31 ENCOUNTER — Other Ambulatory Visit (HOSPITAL_COMMUNITY): Payer: Self-pay

## 2022-06-01 ENCOUNTER — Inpatient Hospital Stay: Payer: Medicare Other

## 2022-06-01 ENCOUNTER — Telehealth: Payer: Self-pay

## 2022-06-01 ENCOUNTER — Inpatient Hospital Stay: Payer: Medicare Other | Admitting: Physician Assistant

## 2022-06-01 NOTE — Telephone Encounter (Signed)
This nurse reached out to [name] and spoke with nurse Lurline Idol.  Advised that this patient does not need to come to his appointment today.  Advised that his appointment will be rescheduled until after he has his Ct scan completed.  Scheduling will reach out to them with the new appointment date and time.  She acknowledged understanding.  She confirmed the scheduled CT date as 06/15/22.  No further questions or concerns noted at this time.

## 2022-06-02 ENCOUNTER — Other Ambulatory Visit (HOSPITAL_COMMUNITY): Payer: Self-pay

## 2022-06-05 ENCOUNTER — Other Ambulatory Visit (HOSPITAL_COMMUNITY): Payer: Self-pay

## 2022-06-09 ENCOUNTER — Other Ambulatory Visit: Payer: Self-pay

## 2022-06-12 ENCOUNTER — Non-Acute Institutional Stay: Payer: Medicare Other | Admitting: Family Medicine

## 2022-06-12 VITALS — BP 104/62 | HR 92 | Temp 98.0°F | Resp 18

## 2022-06-12 DIAGNOSIS — Z515 Encounter for palliative care: Secondary | ICD-10-CM

## 2022-06-12 DIAGNOSIS — K5903 Drug induced constipation: Secondary | ICD-10-CM

## 2022-06-12 DIAGNOSIS — G893 Neoplasm related pain (acute) (chronic): Secondary | ICD-10-CM

## 2022-06-12 DIAGNOSIS — Z659 Problem related to unspecified psychosocial circumstances: Secondary | ICD-10-CM

## 2022-06-12 DIAGNOSIS — Z9181 History of falling: Secondary | ICD-10-CM

## 2022-06-12 DIAGNOSIS — M549 Dorsalgia, unspecified: Secondary | ICD-10-CM

## 2022-06-13 ENCOUNTER — Encounter: Payer: Self-pay | Admitting: Family Medicine

## 2022-06-13 ENCOUNTER — Telehealth: Payer: Self-pay | Admitting: Family Medicine

## 2022-06-13 DIAGNOSIS — Z9181 History of falling: Secondary | ICD-10-CM

## 2022-06-13 DIAGNOSIS — C3492 Malignant neoplasm of unspecified part of left bronchus or lung: Secondary | ICD-10-CM

## 2022-06-13 NOTE — Progress Notes (Signed)
[facility] Palliative Care Consult Note Telephone: [phone]  Fax: [phone]    Date of encounter: 06/12/2022 5:40 PM PATIENT NAME: Christopher Dawson [address] [place] [address]   [phone] (home)  DOB: 1955-06-04 MRN: [id number] PRIMARY CARE PROVIDER:    [name], MD,  [place] [address] [phone]  REFERRING PROVIDER:   [name], MD [place],  [place] [address] [phone]  RESPONSIBLE PARTY:    Contact Information     Name Relation Home Work Mobile   Corum,Lou Mother [phone]     Rennis Harding   [phone]        I met face to face with patient in his assisted living facility. Palliative Care was asked to follow this patient by consultation request of  [name],* to address advance care planning and complex medical decision making. This is a follow up visit.  ASSESSMENT , SYMPTOM MANAGEMENT AND PLAN / RECOMMENDATIONS:   Cancer related pain of low back/right hip pain  Has scheduled CT chest, abd and pelvis with contrast on 06/15/22 at 10:30 am at [facility]. Check that transportation has been arranged by facility SW. Follow up with primary oncology team on how pain meds and oral chemo ordered, on pt's subjective report of increased pain.  2.   At risk for falling Leaning persistently to right in bed and holding balance with his hand. Follow up with facility to see if right side of bed can be moved against the wall to provide support/prevent falls.  3.   Palliative Care Encounter Discuss admission criteria for Hospice services including facility with Hospice, Hospice GIP with emergent symptom management and life expectancy of 2 weeks or less and residential with need to cover room/board. Encouraged pt to contact the business office and discuss feasible options for SNF, SNF with Palliative follow up, SNF with hospice to  see what his financial responsibilities would be to make the decision when he is ready to take next step. Expressed goal to continue Krazati at present time to extend his survival.  4.   Drug Induced Constipation Encourage continued Sennokot, increased fiber and water as well as continued lactulose.  5.  Poor social situation Does not have 24/7 support at home, is bedbound, has contracture and requires significant assistance for ADLs. Will likely need LTC placement vs inpatient hospice or residential hospice.     Advance Care Planning/Goals of Care:  CODE STATUS: Limited Full Code without intubation    Follow up Palliative Care Visit: Palliative care will continue to follow for complex medical decision making, advance care planning, and clarification of goals. Return 4 weeks or prn.    This visit was coded based on medical decision making (MDM).  PPS: 30%  HOSPICE ELIGIBILITY/DIAGNOSIS: TBD  Chief Complaint:  Pt requested Palliative Care follow up as he has questions about Hospice level of care. He c/o severe right low back and flank pain.  HISTORY OF PRESENT ILLNESS:  Christopher Dawson is a 67 y.o. 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  Most recent abdominal CT in 01/2022 showed new right adrenal mass.  Currently he continues on oral chemotherapy agent Krazati.  He also has a hx of Marfan's syndrome,  COPD, chronic lumbar radiculopathy, thrombocytopenia, depression, hx of ETOH abuse, hypocalcemia, HLD, hyponatremia and constipation.  He continues to report significant issues with  constipation.  He states he has good days and bad days making it difficult for him to know what to do.  Denies CP, SOB, nausea, vomiting or falls. His roommate continues to turn off the heat and open the window to get more air with pt reporting "I am always cold". He does have an electric blanket he uses on his legs.   Reports an increase in pain in his right low back and flank.  He remains on fentanyl patch 25 mcg/hr Q 3 days, is taking OxyContin extended release and has OxyCodone IR for breakthrough pain.  He states the pain for him is discouraging.  He remains on [place], oral chemotherapy which has helped keep his symptoms somewhat controlled and he credits with extending his life.  He has received SBRT radiation to T12 lesion with last dose given 04/06/22. He had requested a visit to discuss Hospice.  Advised pt that he would not be able to go on Hospice service as long as he continues receiving treatment for cancer.  He states "I don't want to die here.  Some days are good days and some days are really bad days." He states being concerned about being able to get his pain medicine and indicates he has been told before that the medication had run out and would be replaced as soon as possible.  Advised him to address his concerns with roommate, desire for hospice at some point in the future and issues with his bed with SW, facility business office and his Oncology Care team.  Advised him that there are times when Hospice can service a patient in the facility but he needs to talk with the business office to see if room and board at the facility would be covered as well as hospice.  He mentioned going to an inpatient hospice home and was educated that most require GIP with symptom management to have the stay covered.  Explained that if we are having to do multiple dose adjustments for pain then that may well be sufficient for GIP symptoms but usually they are looking at expected life span of 2 weeks or less.  Advised after he gets information from the facility of what is covered or not under his payor sources, he can look at discussing with his Oncology team about his wishes.  Advised him if he truly felt better on his chemotherapy to continue for as long as possible.  Encouraged him to share pain complaints with low back since  he has had bone mets previously as this may also be an issue or could be from constipation or a combination thereof.  He did not know when his next appointment was with Dr Julien Nordmann but states he does go to the cancer center for his imaging and labs. Advised that I would check in with the facility staff about his pain meds to see if we could identify the issue and as long as he wished to continue with chemo and aggressive treatment that I would continue to support him and his team for symptom management.  He believes that since he was placed on the low air loss mattress that his pain has increased and he is having more difficulty with staying off the right edge of the bed.  The last time I visited patient he was assisted by myself and a facility staff member to move more to the center of the bed and change to his left side some.  He was stiff and resistive of movement change  and immediately resumed his prior position. I am not sure if even though he is uncomfortable in this position if his comfort is worse on the opposite side.  History obtained from review of EMR, discussion with Mr. Dugue.      I reviewed available labs, medications, imaging, studies and related documents from the EMR.  Records reviewed and summarized above.   ROS General: NAD Cardiovascular: denies chest pain, denies DOE Pulmonary: denies cough, denies SOB Abdomen: endorses poor to fair appetite, endorses constipation, endorses continence of bowel GU: continent of bladder without foley MSK:  denies increased weakness, no recent falls reported Neurological: endorses pain in his right low back Psych: Endorses irritability with roommate, cold climate of room, low air loss pressure mattress   Physical Exam: Current and past weights: 06/16/21 weight 165 lbs, 10/19/21 weight of 158 lbs, 05/08/22 weight of 145 lbs. Constitutional: NAD General: frail appearing, cachectic with muscle wasting CV: S1S2, RRR, no LE edema Pulmonary: CTAB,  no increased work of breathing, no cough, room air Abdomen: normo-active BS + 4 quadrants, soft and non tender, no ascites GU:  not assessed MSK: noted sarcopenia/muscle wasting of all extremities and noted in facial features with hollowing, cannot extend legs, bedbound and consistently leans to right side almost on edge of bed Skin: warm and dry, no rash noted  Neuro:  noted generalized weakness,  no cognitive impairment Psych: no anxiety, A and O x 3 Hem/lymph/immuno: no widespread bruising   Thank you for the opportunity to participate in the care of Mr. Power.  The palliative care team will continue to follow. Please call our office at [phone] if we can be of additional assistance.   Marijo Conception, FNP -C  COVID-19 PATIENT SCREENING TOOL Asked and negative response unless otherwise noted:   Have you had symptoms of covid, tested positive or been in contact with someone with symptoms/positive test in the past 5-10 days?  unknown

## 2022-06-13 NOTE — Telephone Encounter (Signed)
TCT Carla, SW at [facility] that I am calling to ensure that pt has transport for his scheduled CT at [facility] on Tursday 06/15/22, that he needs to be there by 10:15 am for 10:30 am appt.  She states she will follow up that transport has been arranged.  Advised pt has significant c/o increased back pain, particularly on the right and believes that some of it may be related to his bed. Asked if possible if the bed could be moved so the right side is against the wall to give additional support and help patient from leaning so far over right side and trying to hold up his body weight. She states at times there are regulations about being able to put the bed against the wall but she will follow up with maintenance to see what is permissible. Advised that pt had asked questions about Hospice service and was told that as long as he is receiving active treatment for his cancer he would not be eligible.  Pt was given support for his choice at present to continue chemotherapy and encouraged to discuss Hospice level of care at the facility with the business office to determine what his options are and possible costs associated if any with Hospice at the facility while under long term care.  Advised that pt is having conflict with his roommate that the roommate needs the room cool to be able to breathe his best but that leaves pt being physically cold when he turns off heat and opens the window.  She states she will follow up to see what beds may be available to help the patient.  The facility staff has had conversation with pt and roommate to try and resolve issue which will help for a couple of days the roommates revert back to prior behaviors. She will follow up with issues and let me know.  Will also follow up with Advanced practice provider Cassie Heilingoetter to let her know of patient's pain complaints.  Damaris Hippo FNP-C

## 2022-06-15 ENCOUNTER — Ambulatory Visit (HOSPITAL_COMMUNITY): Payer: Medicare Other

## 2022-06-16 ENCOUNTER — Telehealth: Payer: Self-pay | Admitting: *Deleted

## 2022-06-16 NOTE — Telephone Encounter (Signed)
PC to [place], Solicitor at [facility] [employer], informed her the patient's CT has been rescheduled for 07/05/22 at 9:45, patient is to be NPO after 6:00 a.m.  She verbalizes understanding, will coordinate transportation for the patient.

## 2022-06-19 ENCOUNTER — Inpatient Hospital Stay: Payer: Medicare Other

## 2022-06-19 ENCOUNTER — Inpatient Hospital Stay: Payer: Medicare Other | Admitting: Physician Assistant

## 2022-07-05 ENCOUNTER — Ambulatory Visit (HOSPITAL_COMMUNITY)
Admission: RE | Admit: 2022-07-05 | Discharge: 2022-07-05 | Disposition: A | Discharge status: Home / Self Care | Payer: Medicare Other | Source: Ambulatory Visit | Attending: Physician Assistant | Admitting: Physician Assistant

## 2022-07-05 DIAGNOSIS — G893 Neoplasm related pain (acute) (chronic): Secondary | ICD-10-CM | POA: Diagnosis not present

## 2022-07-05 DIAGNOSIS — C3492 Malignant neoplasm of unspecified part of left bronchus or lung: Secondary | ICD-10-CM | POA: Insufficient documentation

## 2022-07-05 DIAGNOSIS — R52 Pain, unspecified: Secondary | ICD-10-CM | POA: Diagnosis not present

## 2022-07-05 MED ORDER — SODIUM CHLORIDE (PF) 0.9 % IJ SOLN
INTRAMUSCULAR | Status: AC
  Filled 2022-07-05: qty 50

## 2022-07-05 MED ORDER — IOHEXOL 300 MG/ML  SOLN
100.0000 mL | Freq: Once | INTRAMUSCULAR | Status: AC | PRN
  Administered 2022-07-05: 100 mL via INTRAVENOUS

## 2022-07-06 ENCOUNTER — Other Ambulatory Visit (HOSPITAL_COMMUNITY): Payer: Self-pay

## 2022-07-07 ENCOUNTER — Emergency Department (HOSPITAL_COMMUNITY): Payer: Medicare Other

## 2022-07-07 ENCOUNTER — Encounter (HOSPITAL_COMMUNITY): Payer: Self-pay | Admitting: Emergency Medicine

## 2022-07-07 ENCOUNTER — Inpatient Hospital Stay (HOSPITAL_COMMUNITY)
Admission: EM | Admit: 2022-07-07 | Discharge: 2022-07-10 | DRG: 948 | Disposition: A | Discharge status: Hospice | Payer: Medicare Other | Source: Skilled Nursing Facility | Attending: Internal Medicine | Admitting: Internal Medicine

## 2022-07-07 ENCOUNTER — Other Ambulatory Visit: Payer: Self-pay

## 2022-07-07 DIAGNOSIS — E279 Disorder of adrenal gland, unspecified: Secondary | ICD-10-CM | POA: Diagnosis present

## 2022-07-07 DIAGNOSIS — K5900 Constipation, unspecified: Secondary | ICD-10-CM | POA: Diagnosis present

## 2022-07-07 DIAGNOSIS — Z66 Do not resuscitate: Secondary | ICD-10-CM

## 2022-07-07 DIAGNOSIS — R627 Adult failure to thrive: Secondary | ICD-10-CM | POA: Diagnosis not present

## 2022-07-07 DIAGNOSIS — Z885 Allergy status to narcotic agent status: Secondary | ICD-10-CM

## 2022-07-07 DIAGNOSIS — Z8249 Family history of ischemic heart disease and other diseases of the circulatory system: Secondary | ICD-10-CM

## 2022-07-07 DIAGNOSIS — E785 Hyperlipidemia, unspecified: Secondary | ICD-10-CM | POA: Diagnosis present

## 2022-07-07 DIAGNOSIS — Q8740 Marfan's syndrome, unspecified: Secondary | ICD-10-CM

## 2022-07-07 DIAGNOSIS — R64 Cachexia: Secondary | ICD-10-CM | POA: Diagnosis present

## 2022-07-07 DIAGNOSIS — M545 Low back pain, unspecified: Secondary | ICD-10-CM | POA: Diagnosis present

## 2022-07-07 DIAGNOSIS — R8271 Bacteriuria: Secondary | ICD-10-CM

## 2022-07-07 DIAGNOSIS — M8448XA Pathological fracture, other site, initial encounter for fracture: Secondary | ICD-10-CM | POA: Diagnosis present

## 2022-07-07 DIAGNOSIS — Z1152 Encounter for screening for COVID-19: Secondary | ICD-10-CM

## 2022-07-07 DIAGNOSIS — C787 Secondary malignant neoplasm of liver and intrahepatic bile duct: Secondary | ICD-10-CM | POA: Diagnosis present

## 2022-07-07 DIAGNOSIS — C3492 Malignant neoplasm of unspecified part of left bronchus or lung: Secondary | ICD-10-CM | POA: Diagnosis present

## 2022-07-07 DIAGNOSIS — E860 Dehydration: Secondary | ICD-10-CM | POA: Diagnosis present

## 2022-07-07 DIAGNOSIS — Z7189 Other specified counseling: Secondary | ICD-10-CM

## 2022-07-07 DIAGNOSIS — Z515 Encounter for palliative care: Secondary | ICD-10-CM

## 2022-07-07 DIAGNOSIS — F411 Generalized anxiety disorder: Secondary | ICD-10-CM | POA: Diagnosis present

## 2022-07-07 DIAGNOSIS — C7951 Secondary malignant neoplasm of bone: Secondary | ICD-10-CM | POA: Diagnosis present

## 2022-07-07 DIAGNOSIS — Z79899 Other long term (current) drug therapy: Secondary | ICD-10-CM

## 2022-07-07 DIAGNOSIS — Z634 Disappearance and death of family member: Secondary | ICD-10-CM

## 2022-07-07 DIAGNOSIS — Z87891 Personal history of nicotine dependence: Secondary | ICD-10-CM

## 2022-07-07 DIAGNOSIS — J90 Pleural effusion, not elsewhere classified: Secondary | ICD-10-CM | POA: Diagnosis present

## 2022-07-07 DIAGNOSIS — N390 Urinary tract infection, site not specified: Secondary | ICD-10-CM | POA: Diagnosis present

## 2022-07-07 DIAGNOSIS — R1110 Vomiting, unspecified: Secondary | ICD-10-CM

## 2022-07-07 DIAGNOSIS — R1111 Vomiting without nausea: Secondary | ICD-10-CM

## 2022-07-07 DIAGNOSIS — Z79891 Long term (current) use of opiate analgesic: Secondary | ICD-10-CM

## 2022-07-07 DIAGNOSIS — F32A Depression, unspecified: Secondary | ICD-10-CM | POA: Diagnosis present

## 2022-07-07 DIAGNOSIS — J449 Chronic obstructive pulmonary disease, unspecified: Secondary | ICD-10-CM | POA: Diagnosis present

## 2022-07-07 DIAGNOSIS — G893 Neoplasm related pain (acute) (chronic): Principal | ICD-10-CM | POA: Diagnosis present

## 2022-07-07 DIAGNOSIS — F1010 Alcohol abuse, uncomplicated: Secondary | ICD-10-CM | POA: Diagnosis present

## 2022-07-07 DIAGNOSIS — M549 Dorsalgia, unspecified: Secondary | ICD-10-CM | POA: Diagnosis present

## 2022-07-07 LAB — URINALYSIS, ROUTINE W REFLEX MICROSCOPIC
Glucose, UA: NEGATIVE mg/dL
Hgb urine dipstick: NEGATIVE
Ketones, ur: 20 mg/dL — AB
Nitrite: POSITIVE — AB
Protein, ur: 100 mg/dL — AB
Specific Gravity, Urine: 1.025 (ref 1.005–1.030)
WBC, UA: >50 WBC/hpf (ref 0–5)
pH: 5 (ref 5.0–8.0)

## 2022-07-07 LAB — COMPREHENSIVE METABOLIC PANEL
ALT: 12 U/L (ref 0–44)
AST: 20 U/L (ref 15–41)
Albumin: 3.2 g/dL — ABNORMAL LOW (ref 3.5–5.0)
Alkaline Phosphatase: 82 U/L (ref 38–126)
Anion gap: 10 (ref 5–15)
BUN: 23 mg/dL (ref 8–23)
CO2: 23 mmol/L (ref 22–32)
Calcium: 8.5 mg/dL — ABNORMAL LOW (ref 8.9–10.3)
Chloride: 107 mmol/L (ref 98–111)
Creatinine, Ser: 0.71 mg/dL (ref 0.61–1.24)
GFR, Estimated: >60 mL/min (ref >60)
Glucose, Bld: 98 mg/dL (ref 70–99)
Potassium: 3.9 mmol/L (ref 3.5–5.1)
Sodium: 140 mmol/L (ref 135–145)
Total Bilirubin: 0.9 mg/dL (ref 0.3–1.2)
Total Protein: 6.5 g/dL (ref 6.5–8.1)

## 2022-07-07 LAB — CBC WITH DIFFERENTIAL/PLATELET
Abs Immature Granulocytes: 0.12 10*3/uL — ABNORMAL HIGH (ref 0.00–0.07)
Basophils Absolute: 0.1 10*3/uL (ref 0.0–0.1)
Basophils Relative: 1 %
Eosinophils Absolute: 0.5 10*3/uL (ref 0.0–0.5)
Eosinophils Relative: 4 %
HCT: 36.9 % — ABNORMAL LOW (ref 39.0–52.0)
Hemoglobin: 11.1 g/dL — ABNORMAL LOW (ref 13.0–17.0)
Immature Granulocytes: 1 %
Lymphocytes Relative: 5 %
Lymphs Abs: 0.6 10*3/uL — ABNORMAL LOW (ref 0.7–4.0)
MCH: 24.8 pg — ABNORMAL LOW (ref 26.0–34.0)
MCHC: 30.1 g/dL (ref 30.0–36.0)
MCV: 82.6 fL (ref 80.0–100.0)
Monocytes Absolute: 1.1 10*3/uL — ABNORMAL HIGH (ref 0.1–1.0)
Monocytes Relative: 9 %
Neutro Abs: 10.2 10*3/uL — ABNORMAL HIGH (ref 1.7–7.7)
Neutrophils Relative %: 80 %
Platelets: 321 10*3/uL (ref 150–400)
RBC: 4.47 MIL/uL (ref 4.22–5.81)
RDW: 15.9 % — ABNORMAL HIGH (ref 11.5–15.5)
WBC: 12.6 10*3/uL — ABNORMAL HIGH (ref 4.0–10.5)
nRBC: 0 % (ref 0.0–0.2)

## 2022-07-07 LAB — RESP PANEL BY RT-PCR (RSV, FLU A&B, COVID)  RVPGX2
Influenza A by PCR: NEGATIVE
Influenza B by PCR: NEGATIVE
Resp Syncytial Virus by PCR: NEGATIVE
SARS Coronavirus 2 by RT PCR: NEGATIVE

## 2022-07-07 LAB — LACTIC ACID, PLASMA: Lactic Acid, Venous: 1.2 mmol/L (ref 0.5–1.9)

## 2022-07-07 LAB — ETHANOL: Alcohol, Ethyl (B): <10 mg/dL (ref <10)

## 2022-07-07 LAB — TSH: TSH: 2.121 u[IU]/mL (ref 0.350–4.500)

## 2022-07-07 LAB — VITAMIN B12: Vitamin B-12: 486 pg/mL (ref 180–914)

## 2022-07-07 LAB — SALICYLATE LEVEL: Salicylate Lvl: <7 mg/dL — ABNORMAL LOW (ref 7.0–30.0)

## 2022-07-07 LAB — ACETAMINOPHEN LEVEL: Acetaminophen (Tylenol), Serum: <10 ug/mL — ABNORMAL LOW (ref 10–30)

## 2022-07-07 LAB — MAGNESIUM: Magnesium: 2 mg/dL (ref 1.7–2.4)

## 2022-07-07 LAB — BRAIN NATRIURETIC PEPTIDE: B Natriuretic Peptide: 62.1 pg/mL (ref 0.0–100.0)

## 2022-07-07 MED ORDER — PROSOURCE PLUS PO LIQD: 30.0000 mL | Freq: Every day | ORAL | Status: DC

## 2022-07-07 MED ORDER — PANTOPRAZOLE SODIUM 40 MG PO TBEC
40.0000 mg | DELAYED_RELEASE_TABLET | Freq: Every day | ORAL | Status: DC
  Administered 2022-07-08: 40 mg via ORAL
  Filled 2022-07-07: qty 1

## 2022-07-07 MED ORDER — ONDANSETRON HCL 4 MG/2ML IJ SOLN
4.0000 mg | Freq: Four times a day (QID) | INTRAMUSCULAR | Status: DC | PRN
  Administered 2022-07-09: 4 mg via INTRAVENOUS
  Filled 2022-07-07: qty 2

## 2022-07-07 MED ORDER — SODIUM CHLORIDE 0.9 % IV SOLN: INTRAVENOUS | Status: DC

## 2022-07-07 MED ORDER — ACETAMINOPHEN 650 MG RE SUPP: 650.0000 mg | Freq: Four times a day (QID) | RECTAL | Status: DC | PRN

## 2022-07-07 MED ORDER — ACETAMINOPHEN 325 MG PO TABS: 650.0000 mg | ORAL_TABLET | Freq: Four times a day (QID) | ORAL | Status: DC | PRN

## 2022-07-07 MED ORDER — ENOXAPARIN SODIUM 40 MG/0.4ML IJ SOSY
40.0000 mg | PREFILLED_SYRINGE | INTRAMUSCULAR | Status: DC
  Administered 2022-07-07: 40 mg via SUBCUTANEOUS
  Filled 2022-07-07: qty 0.4

## 2022-07-07 MED ORDER — MORPHINE SULFATE (PF) 4 MG/ML IV SOLN
4.0000 mg | Freq: Once | INTRAVENOUS | Status: AC
  Administered 2022-07-07: 4 mg via INTRAVENOUS
  Filled 2022-07-07: qty 1

## 2022-07-07 MED ORDER — SENNOSIDES-DOCUSATE SODIUM 8.6-50 MG PO TABS: 1.0000 | ORAL_TABLET | Freq: Two times a day (BID) | ORAL | Status: DC

## 2022-07-07 MED ORDER — THIAMINE MONONITRATE 100 MG PO TABS
100.0000 mg | ORAL_TABLET | Freq: Every day | ORAL | Status: DC
  Filled 2022-07-07 (×2): qty 1

## 2022-07-07 MED ORDER — LACTATED RINGERS IV BOLUS
1000.0000 mL | Freq: Once | INTRAVENOUS | Status: AC
  Administered 2022-07-07: 1000 mL via INTRAVENOUS

## 2022-07-07 MED ORDER — METOCLOPRAMIDE HCL 5 MG/ML IJ SOLN
10.0000 mg | Freq: Once | INTRAMUSCULAR | Status: AC
  Administered 2022-07-07: 10 mg via INTRAVENOUS
  Filled 2022-07-07: qty 2

## 2022-07-07 MED ORDER — OXYCODONE HCL ER 15 MG PO T12A
15.0000 mg | EXTENDED_RELEASE_TABLET | Freq: Two times a day (BID) | ORAL | Status: DC
  Administered 2022-07-08: 15 mg via ORAL
  Filled 2022-07-07: qty 1

## 2022-07-07 MED ORDER — OXYCODONE HCL 5 MG PO TABS
5.0000 mg | ORAL_TABLET | Freq: Four times a day (QID) | ORAL | Status: DC | PRN
  Administered 2022-07-08: 5 mg via ORAL
  Filled 2022-07-07: qty 1

## 2022-07-07 MED ORDER — ENSURE ENLIVE PO LIQD: 237.0000 mL | Freq: Three times a day (TID) | ORAL | Status: DC

## 2022-07-07 MED ORDER — FENTANYL 12 MCG/HR TD PT72
1.0000 | MEDICATED_PATCH | TRANSDERMAL | Status: DC
  Administered 2022-07-08: 1 via TRANSDERMAL
  Filled 2022-07-07: qty 1

## 2022-07-07 MED ORDER — DIPHENHYDRAMINE HCL 25 MG PO CAPS
25.0000 mg | ORAL_CAPSULE | Freq: Once | ORAL | Status: DC
  Filled 2022-07-07: qty 1

## 2022-07-07 MED ORDER — FOLIC ACID 1 MG PO TABS
1.0000 mg | ORAL_TABLET | Freq: Every day | ORAL | Status: DC
  Filled 2022-07-07: qty 1

## 2022-07-07 MED ORDER — ONDANSETRON HCL 4 MG/2ML IJ SOLN
4.0000 mg | Freq: Once | INTRAMUSCULAR | Status: AC
  Administered 2022-07-07: 4 mg via INTRAVENOUS
  Filled 2022-07-07: qty 2

## 2022-07-07 MED ORDER — ADULT MULTIVITAMIN W/MINERALS CH
1.0000 | ORAL_TABLET | Freq: Every day | ORAL | Status: DC
  Filled 2022-07-07: qty 1

## 2022-07-07 MED ORDER — ONDANSETRON HCL 4 MG PO TABS: 4.0000 mg | ORAL_TABLET | Freq: Four times a day (QID) | ORAL | Status: DC | PRN

## 2022-07-07 MED ORDER — SODIUM CHLORIDE 0.9 % IV SOLN
1.0000 g | Freq: Once | INTRAVENOUS | Status: AC
  Administered 2022-07-07: 1 g via INTRAVENOUS
  Filled 2022-07-07: qty 10

## 2022-07-07 MED ORDER — PRO-STAT AWC PO LIQD: 30.0000 mL | Freq: Every day | ORAL | Status: DC

## 2022-07-07 NOTE — H&P (Signed)
History and Physical    Patient: Christopher Dawson [id number] DOB: August 08, 1955 DOA: 07/07/2022 DOS: the patient was seen and examined on 07/08/2022 PCP: [name], MD  Patient coming from: SNF - [name]. Baseline unable to ambulate due to weakness in LE secondary to thoracic and lumbar metastatic lesions   Chief Complaint: FTT/generalized pain/vomiting   HPI: Christopher Dawson is a 67 y.o. male with medical history significant of anxiety and depression, Marfan syndrome, lung cancer with mets to bone, COPD who presented to ED after he called EMS for generalized pain and overall FTT.  He states eh has not been feeling well and has not been eating or drinking for a "long time." He also states he has had vomiting daily x 2 weeks. He has about one episode a day. He thinks it was due to his medication so he stopped taking everything. He still uses his fentanyl patch. He denies any dysuria, urgency but does have frequency. He has back pain, but this is more chronic.    Denies any fever/chills, vision changes/headaches, chest pain or palpitations, shortness of breath or cough, abdominal pain, diarrhea, dysuria or leg swelling.    He does not smoke or drink alcohol.   ER Course:  vitals: afebrile, bP 108/70, HR: 115, RR: 17, oxygen: 100%RA Pertinent labs: WBC: 12.6, hgb: 11.1, albumin: 3.2, UA: +nitrite, moderate LE, many bacteria, 100 protein, 20 ketones CXR: moderate size layering pleural effusion with adjacent atelectasis.  CT chest/abdomen/pelvis on 3/13: 1. New large right pleural effusion without obvious enhancing pleural nodules. 2. New pulmonary nodules consistent with metastatic disease. 3. Progressive metastatic bone disease with enlarging lytic lesions most notably in the L1 and L2 vertebral bodies. 4. New hepatic metastatic disease. 5. Massive mesenteric, celiac axis, periportal and retroperitoneal partially necrotic appearing adenopathy. Some of the portal adenopathy may  be directly invading the liver. 6. New right adrenal gland nodule consistent with metastatic disease. In ED: given 2L IVF, morphine, zofran, reglan, and started on rocephin. TRH asked to admit    Review of Systems: As mentioned in the history of present illness. All other systems reviewed and are negative. Past Medical History:  Diagnosis Date   Anxiety    Depression    Lumbar radiculopathy, chronic 04/24/2006   Marfan syndrome    Neurosis, depressive    nscl ca with bone mets 04/2021   Shortening, leg, congenital    Past Surgical History:  Procedure Laterality Date   LEG SURGERY     Social History:  reports that he quit smoking about 14 months ago. His smoking use included cigarettes. He has never used smokeless tobacco. He reports current alcohol use of about 1.0 standard drink of alcohol per week. He reports that he does not use drugs.  Allergies  Allergen Reactions   Codeine Other (See Comments)    Made me feel "spaced out"    Family History  Problem Relation Age of Onset   Heart disease Mother     Prior to Admission medications   Medication Sig Start Date End Date Taking? Authorizing Provider  acetaminophen (TYLENOL) 325 MG tablet Take 2 tablets (650 mg total) by mouth every 6 (six) hours as needed for mild pain (or Fever >/= 101). 05/13/21   Allie Bossier, MD  adagrasib (KRAZATI) 200 MG tablet Take 3 tablets (600 mg) by mouth in the morning and at bedtime. 04/14/22   Curt Bears, MD  Amino Acids-Protein Hydrolys (PRO-STAT AWC) LIQD Take 30 mLs by mouth daily. Take  35ml by mouth daily    [provider]  busPIRone (BUSPAR) 7.5 MG tablet Take 7.5 mg by mouth daily. 09/05/21   [provider]  ciclopirox (LOPROX) 0.77 % SUSP Apply topically. 03/24/22   [provider]  feeding supplement (ENSURE ENLIVE / ENSURE PLUS) LIQD Take 237 mLs by mouth 3 (three) times daily between meals. 05/13/21   Allie Bossier, MD  fentaNYL (DURAGESIC) 12 MCG/HR  Place 1 patch onto the skin every 3 (three) days.    [provider]  ferrous sulfate 325 (65 FE) MG tablet Take 325 mg by mouth 2 (two) times daily.    [provider]  folic acid (FOLVITE) 1 MG tablet Take 1 tablet (1 mg total) by mouth daily. Patient not taking: Reported on 06/13/2022 06/16/21   Curt Bears, MD  gabapentin (NEURONTIN) 100 MG capsule Take 1 capsule (100 mg total) by mouth 3 (three) times daily. Patient not taking: Reported on 05/08/2022 05/13/21   Allie Bossier, MD  hydrocortisone cream 1 % Apply 1 Application topically 2 (two) times daily. To back twice daily for 1 week for itch. Patient not taking: Reported on 06/13/2022    [provider]  hydrOXYzine (ATARAX) 50 MG tablet Take 50 mg by mouth 3 (three) times daily. 02/03/22   [provider]  lactulose (CHRONULAC) 10 GM/15ML solution Take 45 mLs (30 g total) by mouth 2 (two) times daily. 05/13/21   Allie Bossier, MD  methylPREDNISolone (MEDROL DOSEPAK) 4 MG TBPK tablet Use as instructed Patient not taking: Reported on 06/13/2022 11/28/21   Heilingoetter, Cassandra L, PA-C  Multiple Vitamin (MULTIVITAMIN WITH MINERALS) TABS tablet Take 1 tablet by mouth daily. 05/13/21   Allie Bossier, MD  ondansetron (ZOFRAN) 4 MG tablet Take 1 tablet (4 mg total) by mouth every 6 (six) hours as needed for nausea. Patient not taking: Reported on 06/13/2022 05/13/21   Allie Bossier, MD  oxycodone (OXY-IR) 5 MG capsule Take 5 mg by mouth every 6 (six) hours as needed for pain (breakthrough pain). Take 1 tab by mouth every six hours for pain.    [provider]  oxyCODONE (OXYCONTIN) 15 mg 12 hr tablet Take 15 mg by mouth every 12 (twelve) hours.    [provider]  pantoprazole (PROTONIX) 40 MG tablet Take 40 mg by mouth daily. 03/09/22   [provider]  polyethylene glycol (MIRALAX / GLYCOLAX) 17 g packet Take 17 g by mouth daily. Patient not taking: Reported on 06/13/2022     [provider]  senna-docusate (SENOKOT-S) 8.6-50 MG tablet Take 1 tablet by mouth 2 (two) times daily. 05/13/21   Allie Bossier, MD  thiamine 100 MG tablet Take 1 tablet (100 mg total) by mouth daily. 05/13/21   Allie Bossier, MD    Physical Exam: Vitals:   07/07/22 2000 07/07/22 2030 07/07/22 2214 07/07/22 2220  BP: 95/62 100/66 105/62   Pulse: 96 97 96   Resp: 13 14 12    Temp:   98.4 F (36.9 C)   TempSrc:   Oral   SpO2: 90% 90% 100%   Weight:    63.7 kg  Height:    6\' 4"  (1.93 m)   General:  Appears calm and comfortable and is in NAD. Cachetic and pale  Eyes:  PERRL, EOMI, normal lids, iris ENT:  HOH, lips & tongue, dry mucous membranes appropriate dentition Neck:  no LAD, masses or thyromegaly; no carotid bruits Cardiovascular:  RRR,  no r/g/m.  No LE edema.  Respiratory:   decreased breath sounds on the Right mid to lobe. Clear in left lung field. Normal effort.  Abdomen:  soft, NT, ND, NABS Back:   normal alignment, no CVAT Skin:  no rash or induration seen on limited exam. +skin tenting  Musculoskeletal:  grossly normal tone BUE/BLE, good ROM, no bony abnormality Lower extremity:  No LE edema.  Limited foot exam with no ulcerations.  2+ distal pulses. Psychiatric:  grossly normal mood and affect, speech fluent and appropriate, AOx3 Neurologic:  CN 2-12 grossly intact, moves all extremities in coordinated fashion, sensation intact   Radiological Exams on Admission: Independently reviewed - see discussion in A/P where applicable  DG Chest Portable 1 View  Result Date: 07/07/2022 CLINICAL DATA:  Shortness of breath, altered mental status EXAM: PORTABLE CHEST 1 VIEW COMPARISON:  Radiograph 06/29/2019, CT 07/05/2022 FINDINGS: There is a moderate size layering right pleural effusion with haziness of the right hemithorax. Known bilateral pulmonary nodules largest in the right peripheral and left peripheral lungs, best seen on prior chest CT. Unchanged  cardiomediastinal silhouette. No pneumothorax. Bones are unchanged. IMPRESSION: Moderate size layering right pleural effusion with adjacent atelectasis. Known pulmonary nodules best evaluated on prior chest CT. Electronically Signed   By: Maurine Simmering M.D.   On: 07/07/2022 13:49    EKG: Independently reviewed.  Sinus tachycardia with rate 103; nonspecific ST changes with no evidence of acute ischemia. PVCs-new since last EKG    Labs on Admission: I have personally reviewed the available labs and imaging studies at the time of the admission.  Pertinent labs:   WBC: 12.6,  hgb: 11.1,  albumin: 3.2,  UA: +nitrite, moderate LE, many bacteria, 100 protein, 20 ketones  Assessment and Plan: Principal Problem:   Failure to thrive in adult Active Problems:   UTI (urinary tract infection) w/sepsis criteria   Vomiting   Pleural effusion on right   Non-small cell carcinoma of left lung, stage 4 (HCC)   Intractable back pain   GAD (generalized anxiety disorder)    Assessment and Plan: * Failure to thrive in adult 67 year old male with history of stage 4 NSCL who presented to ED with 2 week history of N/V, not feeling well, little to no PO intake, weight loss and overall FTT -place in observation -treat UTI, f/u on culture -continue IVF as he is dehydrated and cachetic -resume ensure TID -check TSH, B12, B1 -He had his CT scan on 3/13 and has not heard any results. Discussed the extensive metastatic disease spread which likely contributing to his symptoms. Will have palliative care see about hospice which he is interested in. Will also send his oncologist a note and rounding team can consult oncology if appropriate   UTI (urinary tract infection) w/sepsis criteria Lactic acid wnl  Continue rocephin Continue IVF Follow up on culture   Vomiting History of vomiting daily x 2 weeks. Stopped taking all medication ? If due to progressive metastatic disease and massive mesenteric  adenopathy Zofran PRN   Pleural effusion on right New finding on CT and large in size, secondary to malignancy He is not short of breath or hypoxic Depending on GOC, may benefit from therapeutic thoracentesis, but no urgent need at this time   Non-small cell carcinoma of left lung, stage 4 (HCC) Current therapy: 1) Krazati p.o. daily.  stopped taking this 2 weeks ago and FTT Imaging shows progression of disease and discussed with him.  Palliative care  consult, open to hospice.  Sent his oncologist an email, formal consult tomorrow  Prior therapy includes:  1) Palliative radiotherapy to the T12 destructive pathologic fracture under the care of Dr. Tammi Klippel completed on May 10, 2021. 2) Systemic chemotherapy with carboplatin for AUC of 5, Alimta 500 Mg/M2 and Keytruda 200 Mg IV every 3 weeks. Last dose on 08/08/21. Status post 3 cycles. Discontinued due to disease progression 3) SBRT to adrenal lesion under the care of Dr. Tammi Klippel. Last dose expected on 12/14.   Intractable back pain Stopped all medication except his fentanyl patch Continue fentanyl patch, start back his OxyContin 15mg  BID and oxy-IR 5mg  prn q 6 hours  Discussed with him if he can't tolerate PO we can change to IV If needed.  GAD (generalized anxiety disorder) Stopped taking all medication x 2 weeks ago (buspar and hydroxyzine)     Advance Care Planning:   Code Status: Full Code   Consults: palliative care   DVT Prophylaxis: lovenox   Family Communication: none   Severity of Illness: The appropriate patient status for this patient is OBSERVATION. Observation status is judged to be reasonable and necessary in order to provide the required intensity of service to ensure the patient's safety. The patient's presenting symptoms, physical exam findings, and initial radiographic and laboratory data in the context of their medical condition is felt to place them at decreased risk for further clinical deterioration.  Furthermore, it is anticipated that the patient will be medically stable for discharge from the hospital within 2 midnights of admission.   Author: Orma Flaming, MD 07/08/2022 12:27 AM  For on call review [email or web address].

## 2022-07-07 NOTE — ED Triage Notes (Signed)
Patient called EMS from [name] due to generalized pain and failure to thrive. It is unclear how long he has had not PO intake. The patient also vomited at 5am this morning.    HX: lung CA , COPD (no compliant)    EMS vitals 90/50 BP 120 HR 16 RR 129 CBG 97.8 temp

## 2022-07-07 NOTE — Assessment & Plan Note (Signed)
History of vomiting daily x 2 weeks. Stopped taking all medication ? If due to progressive metastatic disease and massive mesenteric adenopathy Zofran PRN

## 2022-07-07 NOTE — Assessment & Plan Note (Addendum)
Lactic acid wnl  Continue rocephin Continue IVF Follow up on culture

## 2022-07-07 NOTE — ED Provider Notes (Signed)
  Physical Exam  BP 107/68   Pulse 96   Temp 98.7 F (37.1 C) (Oral)   Resp 12   SpO2 96%   Physical Exam Vitals and nursing note reviewed.  Constitutional:      General: He is not in acute distress.    Comments: Cachectic 67 year old male  HENT:     Head: Normocephalic and atraumatic.     Nose: Nose normal.  Eyes:     General: No scleral icterus. Cardiovascular:     Rate and Rhythm: Normal rate and regular rhythm.     Pulses: Normal pulses.     Heart sounds: Normal heart sounds.  Pulmonary:     Effort: Pulmonary effort is normal. No respiratory distress.     Breath sounds: No wheezing.  Abdominal:     Palpations: Abdomen is soft.     Tenderness: There is no abdominal tenderness.  Musculoskeletal:     Cervical back: Normal range of motion.     Right lower leg: No edema.     Left lower leg: No edema.  Skin:    General: Skin is warm and dry.     Capillary Refill: Capillary refill takes less than 2 seconds.  Neurological:     Mental Status: He is alert. Mental status is at baseline.     Comments: Oriented, moves all 4 extremities  Psychiatric:        Mood and Affect: Mood normal.        Behavior: Behavior normal.     Procedures  .Critical Care  Performed by: Tedd Sias, PA Authorized by: Tedd Sias, PA   Critical care provider statement:    Critical care time (minutes):  35   Critical care time was exclusive of:  Separately billable procedures and treating other patients and teaching time   Critical care was necessary to treat or prevent imminent or life-threatening deterioration of the following conditions:  Sepsis   Critical care was time spent personally by me on the following activities:  Development of treatment plan with patient or surrogate, review of old charts, re-evaluation of patient's condition, pulse oximetry, ordering and review of radiographic studies, ordering and review of laboratory studies, ordering and performing treatments and  interventions, obtaining history from patient or surrogate, examination of patient and evaluation of patient's response to treatment   Care discussed with: admitting provider     ED Course / MDM    Medical Decision Making Amount and/or Complexity of Data Reviewed Labs: ordered. Radiology: ordered.  Risk Prescription drug management. Decision regarding hospitalization.   Patient with leukocytosis, tachycardia, source of infection is urine which was very slowly obtained as patient was quite dehydrated not produce urine until 2 L of IV fluids administered.  Will give 1 g of Rocephin and admit to medicine.  He has had multiple episodes of vomiting even after Reglan and Zofran were administered.   Admitted to Dr. Rogers Blocker of internal medicine    Pati Gallo [place], [place] 07/07/22 2115    Wyvonnia Dusky, MD 07/07/22 2300

## 2022-07-07 NOTE — Assessment & Plan Note (Signed)
Stopped taking all medication x 2 weeks ago (buspar and hydroxyzine)

## 2022-07-07 NOTE — Progress Notes (Deleted)
[facility] OFFICE PROGRESS NOTE  [name], MD [place] [address]  DIAGNOSIS: Stage IV (T1c, N1, M1 C) non-small cell lung cancer, adenosquamous carcinoma presented with left upper lobe lung nodule in addition to left hilar adenopathy and bone metastasis diagnosed January 2023.   Molecular studies by [facility] 1 showed positive KRAS G12C mutation   PD-L1 expression 10%  PRIOR THERAPY: 1) Palliative radiotherapy to the T12 destructive pathologic fracture under the care of Dr. Tammi Klippel completed on May 10, 2021. 2) Systemic chemotherapy with carboplatin for AUC of 5, Alimta 500 Mg/M2 and Keytruda 200 Mg IV every 3 weeks. Last dose on 08/08/21. Status post 3 cycles. Discontinued due to disease progression 3)SBRT to adrenal lesion under the care of Dr. Tammi Klippel. Last dose expected on 12/14.  4) Krazati p.o. daily.  First dose Sep 07, 2021.  Status post *** months of treatment. Discontinued due to evidence of disease progression     CURRENT THERAPY: ***Hospice***???  INTERVAL HISTORY: Christopher Dawson 67 y.o. male returns to the clinic today for a follow-up visit. The patient is currently a resident of a skilled nursing facility. He is not ambulatory and comes to this appointment on a stretcher. He has missed several appointments over the last few weeks due to transportation limitations at the SNF for her CT scans and follow ups.   The patient is unable to ambulate due to weakness in his lower extremity secondary to thoracic and lumbar metastatic lesions with pathological and epidural extension. This is his baseline.   He was last seen by Dr. Julien Nordmann and myself on 05/18/2022.  At that point time, he was supposed to have a restaging CT scan prior to his appointment.  This was finally completed on 07/05/22 unfortunately shows disease progression.  Presented to the emergency room on 07/06/2022 for failure to thrive and weakness.  Not eating?  Denies any fever or chills.  Denies any chest pain or hemoptysis.  He has not had dyspnea or cough.  Pleural effusion?  Denies any nausea, vomiting, or diarrhea.  He reports baseline constipation for which he takes stool softeners and medicine provided by his facility.  Patient is here today for evaluation to review his most recent scan results and discuss the next steps in his care.     MEDICAL HISTORY: Past Medical History:  Diagnosis Date   Anxiety    Depression    Lumbar radiculopathy, chronic 04/24/2006   Marfan syndrome    Neurosis, depressive    nscl ca with bone mets 04/2021   Shortening, leg, congenital     ALLERGIES:  is allergic to codeine.  MEDICATIONS:  Current Outpatient Medications  Medication Sig Dispense Refill   acetaminophen (TYLENOL) 325 MG tablet Take 2 tablets (650 mg total) by mouth every 6 (six) hours as needed for mild pain (or Fever >/= 101). 30 tablet 0   adagrasib (KRAZATI) 200 MG tablet Take 3 tablets (600 mg) by mouth in the morning and at bedtime. 180 tablet 3   Amino Acids-Protein Hydrolys (PRO-STAT AWC) LIQD Take 30 mLs by mouth daily. Take 68ml by mouth daily     busPIRone (BUSPAR) 7.5 MG tablet Take 7.5 mg by mouth daily.     ciclopirox (LOPROX) 0.77 % SUSP Apply topically.     feeding supplement (ENSURE ENLIVE / ENSURE PLUS) LIQD Take 237 mLs by mouth 3 (three) times daily between meals. 237 mL 12   fentaNYL (DURAGESIC) 12 MCG/HR Place 1 patch  onto the skin every 3 (three) days.     ferrous sulfate 325 (65 FE) MG tablet Take 325 mg by mouth 2 (two) times daily.     folic acid (FOLVITE) 1 MG tablet Take 1 tablet (1 mg total) by mouth daily. (Patient not taking: Reported on 06/13/2022) 30 tablet 4   gabapentin (NEURONTIN) 100 MG capsule Take 1 capsule (100 mg total) by mouth 3 (three) times daily. (Patient not taking: Reported on 05/08/2022) 90 capsule 0   hydrocortisone cream 1 % Apply 1 Application topically 2 (two) times daily. To back twice daily  for 1 week for itch. (Patient not taking: Reported on 06/13/2022)     hydrOXYzine (ATARAX) 50 MG tablet Take 50 mg by mouth 3 (three) times daily.     lactulose (CHRONULAC) 10 GM/15ML solution Take 45 mLs (30 g total) by mouth 2 (two) times daily. 236 mL 0   methylPREDNISolone (MEDROL DOSEPAK) 4 MG TBPK tablet Use as instructed (Patient not taking: Reported on 06/13/2022) 21 tablet 0   Multiple Vitamin (MULTIVITAMIN WITH MINERALS) TABS tablet Take 1 tablet by mouth daily. 30 tablet 0   ondansetron (ZOFRAN) 4 MG tablet Take 1 tablet (4 mg total) by mouth every 6 (six) hours as needed for nausea. (Patient not taking: Reported on 06/13/2022) 20 tablet 0   oxycodone (OXY-IR) 5 MG capsule Take 5 mg by mouth every 6 (six) hours as needed for pain (breakthrough pain). Take 1 tab by mouth every six hours for pain.     oxyCODONE (OXYCONTIN) 15 mg 12 hr tablet Take 15 mg by mouth every 12 (twelve) hours.     pantoprazole (PROTONIX) 40 MG tablet Take 40 mg by mouth daily.     polyethylene glycol (MIRALAX / GLYCOLAX) 17 g packet Take 17 g by mouth daily. (Patient not taking: Reported on 06/13/2022)     senna-docusate (SENOKOT-S) 8.6-50 MG tablet Take 1 tablet by mouth 2 (two) times daily. 30 tablet 0   thiamine 100 MG tablet Take 1 tablet (100 mg total) by mouth daily. 30 tablet 0   No current facility-administered medications for this visit.    SURGICAL HISTORY:  Past Surgical History:  Procedure Laterality Date   LEG SURGERY      REVIEW OF SYSTEMS:   Review of Systems  Constitutional: Negative for appetite change, chills, fatigue, fever and unexpected weight change.  HENT:   Negative for mouth sores, nosebleeds, sore throat and trouble swallowing.   Eyes: Negative for eye problems and icterus.  Respiratory: Negative for cough, hemoptysis, shortness of breath and wheezing.   Cardiovascular: Negative for chest pain and leg swelling.  Gastrointestinal: Negative for abdominal pain, constipation, diarrhea,  nausea and vomiting.  Genitourinary: Negative for bladder incontinence, difficulty urinating, dysuria, frequency and hematuria.   Musculoskeletal: Negative for back pain, gait problem, neck pain and neck stiffness.  Skin: Negative for itching and rash.  Neurological: Negative for dizziness, extremity weakness, gait problem, headaches, light-headedness and seizures.  Hematological: Negative for adenopathy. Does not bruise/bleed easily.  Psychiatric/Behavioral: Negative for confusion, depression and sleep disturbance. The patient is not nervous/anxious.     PHYSICAL EXAMINATION:  There were no vitals taken for this visit.  ECOG PERFORMANCE STATUS: {CHL ONC ECOG [id number]  Physical Exam  Constitutional: Oriented to person, place, and time and well-developed, well-nourished, and in no distress. No distress.  HENT:  Head: Normocephalic and atraumatic.  Mouth/Throat: Oropharynx is clear and moist. No oropharyngeal exudate.  Eyes: Conjunctivae are normal. Right eye  exhibits no discharge. Left eye exhibits no discharge. No scleral icterus.  Neck: Normal range of motion. Neck supple.  Cardiovascular: Normal rate, regular rhythm, normal heart sounds and intact distal pulses.   Pulmonary/Chest: Effort normal and breath sounds normal. No respiratory distress. No wheezes. No rales.  Abdominal: Soft. Bowel sounds are normal. Exhibits no distension and no mass. There is no tenderness.  Musculoskeletal: Normal range of motion. Exhibits no edema.  Lymphadenopathy:    No cervical adenopathy.  Neurological: Alert and oriented to person, place, and time. Exhibits normal muscle tone. Gait normal. Coordination normal.  Skin: Skin is warm and dry. No rash noted. Not diaphoretic. No erythema. No pallor.  Psychiatric: Mood, memory and judgment normal.  Vitals reviewed.  LABORATORY DATA: Lab Results  Component Value Date   WBC 12.6 (H) 07/07/2022   HGB 11.1 (L) 07/07/2022   HCT 36.9 (L) 07/07/2022    MCV 82.6 07/07/2022   PLT 321 07/07/2022      Chemistry      Component Value Date/Time   NA 140 07/07/2022 1252   NA 146 (H) 07/04/2018 1642   K 3.9 07/07/2022 1252   CL 107 07/07/2022 1252   CO2 23 07/07/2022 1252   BUN 23 07/07/2022 1252   BUN 13 07/04/2018 1642   CREATININE 0.71 07/07/2022 1252   CREATININE 0.56 (L) 05/18/2022 0940   CREATININE 0.95 07/04/2012 1534      Component Value Date/Time   CALCIUM 8.5 (L) 07/07/2022 1252   ALKPHOS 82 07/07/2022 1252   AST 20 07/07/2022 1252   AST 14 (L) 05/18/2022 0940   ALT 12 07/07/2022 1252   ALT 8 05/18/2022 0940   BILITOT 0.9 07/07/2022 1252   BILITOT 0.5 05/18/2022 0940       RADIOGRAPHIC STUDIES:  DG Chest Portable 1 View  Result Date: 07/07/2022 CLINICAL DATA:  Shortness of breath, altered mental status EXAM: PORTABLE CHEST 1 VIEW COMPARISON:  Radiograph 06/29/2019, CT 07/05/2022 FINDINGS: There is a moderate size layering right pleural effusion with haziness of the right hemithorax. Known bilateral pulmonary nodules largest in the right peripheral and left peripheral lungs, best seen on prior chest CT. Unchanged cardiomediastinal silhouette. No pneumothorax. Bones are unchanged. IMPRESSION: Moderate size layering right pleural effusion with adjacent atelectasis. Known pulmonary nodules best evaluated on prior chest CT. Electronically Signed   By: Maurine Simmering M.D.   On: 07/07/2022 13:49   CT Abdomen Pelvis W Contrast  Result Date: 07/07/2022 CLINICAL DATA:  Metastatic non-small cell lung cancer. Assess treatment response. * Tracking Code: BO * EXAM: CT CHEST, ABDOMEN, AND PELVIS WITH CONTRAST TECHNIQUE: Multidetector CT imaging of the chest, abdomen and pelvis was performed following the standard protocol during bolus administration of intravenous contrast. RADIATION DOSE REDUCTION: This exam was performed according to the departmental dose-optimization program which includes automated exposure control, adjustment of the mA  and/or kV according to patient size and/or use of iterative reconstruction technique. CONTRAST:  147mL OMNIPAQUE IOHEXOL 300 MG/ML  SOLN COMPARISON:  Multiple previous imaging studies. The most recent CT scan is 02/15/2022. Prior PET-CT 06/15/2021 FINDINGS: CT CHEST FINDINGS Cardiovascular: The heart is normal in size. No pericardial effusion. The aorta is within normal limits in caliber. No dissection. The branch vessels are patent. Scattered coronary artery calcifications. Mediastinum/Nodes: Stable 9 mm right hilar lymph node. Scattered small mediastinal nodes are stable. The esophagus is grossly normal. The thyroid gland is unremarkable. Lungs/Pleura: New large right pleural effusion without obvious enhancing pleural nodules. There is a  new right upper lobe pulmonary nodule measuring 18 mm on image 78/5. There is also a new subpleural left upper lobe pulmonary nodule measuring 15 mm on image 65/5. 5 mm pulmonary nodule in the left lower lobe on image number 100/5. Other smaller left lower lobe pulmonary nodules are noted. Findings consistent with metastatic disease. 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 Musculoskeletal: Progressive metastatic bone disease with enlarging lytic lesions most notably in the L1 and L2 vertebral bodies. CT ABDOMEN PELVIS FINDINGS Hepatobiliary: New hepatic metastatic disease. Adjacent 4.6 cm left hepatic lobe lesions anteriorly. Extensive portal adenopathy may be invading the liver also. The gallbladder is unremarkable. No common bile duct dilatation. Pancreas: Marked atrophy of the pancreas which is surrounded by bulky adenopathy. Spleen: Normal size.  No focal lesions. Adrenals/Urinary Tract: New 18 mm right thyroid nodule consistent with metastatic disease. The left adrenal gland is. Both kidneys are grossly normal. The bladder is normal. Stomach/Bowel: The stomach, duodenum small bowel colon are grossly normal. Large amount of stool in  the rectum could suggest fecal impaction. Vascular/Lymphatic: The aorta and branch vessels are patent. The major venous structures are patent. The IVC is compressed and displaced by bulky adenopathy. Massive mesenteric, celiac axis, periportal and retroperitoneal partially necrotic appearing adenopathy. Some of the portal adenopathy may be directly invading the liver. The periportal/mesenteric nodal mass measures approximately 12.6 x 5.5 cm on image 59/3. Partially necrotic inter aortocaval nodal lesion measures 4.2 x 4.2 cm on image number 69/3. Lower right-sided retroperitoneal nodal mass measures 6.6 x 6.6 cm on image 77/3. The IVC is compressed and displaced but not occluded or thrombosed. The renal veins are still patent displaced. Reproductive: The prostate gland and seminal vesicles are grossly normal. Other: No ascites or abdominal wall hernia. Musculoskeletal: Progressive lytic metastatic bone disease lesions notably involving the L1 and L2 vertebral bodies. IMPRESSION: 1. New large right pleural effusion without obvious enhancing pleural nodules. 2. New pulmonary nodules consistent with metastatic disease. 3. Progressive metastatic bone disease with enlarging lytic lesions most notably in the L1 and L2 vertebral bodies. 4. New hepatic metastatic disease. 5. Massive mesenteric, celiac axis, periportal and retroperitoneal partially necrotic appearing adenopathy. Some of the portal adenopathy may be directly invading the liver. 6. New right adrenal gland nodule consistent with metastatic disease. Electronically Signed   By: Marijo Sanes M.D.   On: 07/07/2022 11:14   CT Chest W Contrast  Result Date: 07/07/2022 CLINICAL DATA:  Metastatic non-small cell lung cancer. Assess treatment response. * Tracking Code: BO * EXAM: CT CHEST, ABDOMEN, AND PELVIS WITH CONTRAST TECHNIQUE: Multidetector CT imaging of the chest, abdomen and pelvis was performed following the standard protocol during bolus administration of  intravenous contrast. RADIATION DOSE REDUCTION: This exam was performed according to the departmental dose-optimization program which includes automated exposure control, adjustment of the mA and/or kV according to patient size and/or use of iterative reconstruction technique. CONTRAST:  19mL OMNIPAQUE IOHEXOL 300 MG/ML  SOLN COMPARISON:  Multiple previous imaging studies. The most recent CT scan is 02/15/2022. Prior PET-CT 06/15/2021 FINDINGS: CT CHEST FINDINGS Cardiovascular: The heart is normal in size. No pericardial effusion. The aorta is within normal limits in caliber. No dissection. The branch vessels are patent. Scattered coronary artery calcifications. Mediastinum/Nodes: Stable 9 mm right hilar lymph node. Scattered small mediastinal nodes are stable. The esophagus is grossly normal. The thyroid gland is unremarkable. Lungs/Pleura: New large right pleural effusion without obvious enhancing pleural nodules. There is a new  right upper lobe pulmonary nodule measuring 18 mm on image 78/5. There is also a new subpleural left upper lobe pulmonary nodule measuring 15 mm on image 65/5. 5 mm pulmonary nodule in the left lower lobe on image number 100/5. Other smaller left lower lobe pulmonary nodules are noted. Findings consistent with metastatic disease. 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 Musculoskeletal: Progressive metastatic bone disease with enlarging lytic lesions most notably in the L1 and L2 vertebral bodies. CT ABDOMEN PELVIS FINDINGS Hepatobiliary: New hepatic metastatic disease. Adjacent 4.6 cm left hepatic lobe lesions anteriorly. Extensive portal adenopathy may be invading the liver also. The gallbladder is unremarkable. No common bile duct dilatation. Pancreas: Marked atrophy of the pancreas which is surrounded by bulky adenopathy. Spleen: Normal size.  No focal lesions. Adrenals/Urinary Tract: New 18 mm right thyroid nodule consistent with metastatic  disease. The left adrenal gland is. Both kidneys are grossly normal. The bladder is normal. Stomach/Bowel: The stomach, duodenum small bowel colon are grossly normal. Large amount of stool in the rectum could suggest fecal impaction. Vascular/Lymphatic: The aorta and branch vessels are patent. The major venous structures are patent. The IVC is compressed and displaced by bulky adenopathy. Massive mesenteric, celiac axis, periportal and retroperitoneal partially necrotic appearing adenopathy. Some of the portal adenopathy may be directly invading the liver. The periportal/mesenteric nodal mass measures approximately 12.6 x 5.5 cm on image 59/3. Partially necrotic inter aortocaval nodal lesion measures 4.2 x 4.2 cm on image number 69/3. Lower right-sided retroperitoneal nodal mass measures 6.6 x 6.6 cm on image 77/3. The IVC is compressed and displaced but not occluded or thrombosed. The renal veins are still patent displaced. Reproductive: The prostate gland and seminal vesicles are grossly normal. Other: No ascites or abdominal wall hernia. Musculoskeletal: Progressive lytic metastatic bone disease lesions notably involving the L1 and L2 vertebral bodies. IMPRESSION: 1. New large right pleural effusion without obvious enhancing pleural nodules. 2. New pulmonary nodules consistent with metastatic disease. 3. Progressive metastatic bone disease with enlarging lytic lesions most notably in the L1 and L2 vertebral bodies. 4. New hepatic metastatic disease. 5. Massive mesenteric, celiac axis, periportal and retroperitoneal partially necrotic appearing adenopathy. Some of the portal adenopathy may be directly invading the liver. 6. New right adrenal gland nodule consistent with metastatic disease. Electronically Signed   By: Marijo Sanes M.D.   On: 07/07/2022 11:14     ASSESSMENT/PLAN:  This is a very pleasant 67 year old Caucasian male with stage IV non-small cell lung cancer, adenosquamous carcinoma.  The patient  presented with a left upper lobe spiculated nodule with peripheral stranding and osseous metastatic disease, some of which involves epidural extension.  The patient was diagnosed in January 2023.  The patient's molecular studies show he is positive for K-ras G12 C mutation which may use in the second line setting. His PDL1 expression ins negative.    The patient completed palliative radiation to the T12 lesion under the care of Dr. Tammi Klippel on 05/10/21.    He initially started palliative systemic chemotherapy with carboplatin for AUC of 5, Alimta 500 Mg/M2 and Keytruda 200 Mg IV every 3 weeks.  First cycle June 30, 2021.  Status post 3 cycles.  The patient tolerated the second cycle of his treatment fairly well except for fatigue. Unfortunately, the patient had been in such a disease progression with progression of the left upper lobe lung lesion, left hilar and subcarinal adenopathy.  He also had new sclerotic lesion at T1.  Therefore, the patient started targeted treatment with [place].  He started this in May 2023.  He has been on this for approximately *** months.      Patient had missed several appointments for his restaging CT scan.  He finally had a restaging scan last week.  Dr. Julien Nordmann personally and independently reviewed the scan and discussed the results with the patient today.  Unfortunately, the scan showed significant disease progression.  Dr. Julien Nordmann had a lengthy discussion with the patient today about his current condition.  Unfortunately due to the patient's failure to thrive and weakness, the patient likely would not tolerate docetaxel and Cyramza IV chemotherapy well.  Dr. Julien Nordmann strongly encouraged the patient to consider palliative care and hospice.  The patient was agreeable.  I will reach out to his palliative care nurse practitioner at [name] to make her aware.  We will place a referral for palliative care and hospice.  Follow-up appointments are needed.  But we be  happy to see the patient on an as-needed basis.  The patient was advised to call immediately if she has any concerning symptoms in the interval. The patient voices understanding of current disease status and treatment options and is in agreement with the current care plan. All questions were answered. The patient knows to call the clinic with any problems, questions or concerns. We can certainly see the patient much sooner if necessary    No orders of the defined types were placed in this encounter.    I spent {CHL ONC TIME VISIT - [id number] counseling the patient face to face. The total time spent in the appointment was {CHL ONC TIME VISIT - [id number].  Shyasia Funches L Amaurie Schreckengost, PA-C 07/07/22

## 2022-07-07 NOTE — Assessment & Plan Note (Signed)
Current therapy: 1) Krazati p.o. daily.  stopped taking this 2 weeks ago and FTT Imaging shows progression of disease and has been discussed with him - patient also seen by palliative care and oncology on 3/16 - see FTT above as well - continue comfort care

## 2022-07-07 NOTE — ED Provider Notes (Signed)
[place] EMERGENCY DEPARTMENT AT [facility] Provider Note   CSN: [id number] Arrival date & time: 07/07/22  1222     History {Add pertinent medical, surgical, social history, OB history to HPI:1} Chief Complaint  Patient presents with   Generalized Body Aches   Failure To Thrive    Christopher Dawson is a 67 y.o. male with past medical history significant for depression, tobacco abuse, anxiety, Marfan syndrome, history of alcohol abuse, COPD, and metastatic lung cancer with diffuse metastasis presents from his nursing facility due to generalized pain, failure to thrive.  Patient reports that he has not had much p.o. intake recently.  He had vomited 5 AM this morning.  He reports.  Generally throughout, and some baseline shortness of breath not significantly worse from his usual.  Patient is requesting to brush his teeth during my evaluation.  He is still taking immunotherapy for his lung cancer but is not undergoing any surgery, radiation at this time.  HPI     Home Medications Prior to Admission medications   Medication Sig Start Date End Date Taking? Authorizing Provider  acetaminophen (TYLENOL) 325 MG tablet Take 2 tablets (650 mg total) by mouth every 6 (six) hours as needed for mild pain (or Fever >/= 101). 05/13/21   Allie Bossier, MD  adagrasib (KRAZATI) 200 MG tablet Take 3 tablets (600 mg) by mouth in the morning and at bedtime. 04/14/22   Curt Bears, MD  Amino Acids-Protein Hydrolys (PRO-STAT AWC) LIQD Take 30 mLs by mouth daily. Take 68ml by mouth daily    [provider]  busPIRone (BUSPAR) 7.5 MG tablet Take 7.5 mg by mouth daily. 09/05/21   [provider]  ciclopirox (LOPROX) 0.77 % SUSP Apply topically. 03/24/22   [provider]  feeding supplement (ENSURE ENLIVE / ENSURE PLUS) LIQD Take 237 mLs by mouth 3 (three) times daily between meals. 05/13/21   Allie Bossier, MD  fentaNYL (DURAGESIC) 12 MCG/HR Place 1 patch onto the skin  every 3 (three) days.    [provider]  ferrous sulfate 325 (65 FE) MG tablet Take 325 mg by mouth 2 (two) times daily.    [provider]  folic acid (FOLVITE) 1 MG tablet Take 1 tablet (1 mg total) by mouth daily. Patient not taking: Reported on 06/13/2022 06/16/21   Curt Bears, MD  gabapentin (NEURONTIN) 100 MG capsule Take 1 capsule (100 mg total) by mouth 3 (three) times daily. Patient not taking: Reported on 05/08/2022 05/13/21   Allie Bossier, MD  hydrocortisone cream 1 % Apply 1 Application topically 2 (two) times daily. To back twice daily for 1 week for itch. Patient not taking: Reported on 06/13/2022    [provider]  hydrOXYzine (ATARAX) 50 MG tablet Take 50 mg by mouth 3 (three) times daily. 02/03/22   [provider]  lactulose (CHRONULAC) 10 GM/15ML solution Take 45 mLs (30 g total) by mouth 2 (two) times daily. 05/13/21   Allie Bossier, MD  methylPREDNISolone (MEDROL DOSEPAK) 4 MG TBPK tablet Use as instructed Patient not taking: Reported on 06/13/2022 11/28/21   Heilingoetter, Cassandra L, PA-C  Multiple Vitamin (MULTIVITAMIN WITH MINERALS) TABS tablet Take 1 tablet by mouth daily. 05/13/21   Allie Bossier, MD  ondansetron (ZOFRAN) 4 MG tablet Take 1 tablet (4 mg total) by mouth every 6 (six) hours as needed for nausea. Patient not taking: Reported on 06/13/2022 05/13/21   Allie Bossier, MD  oxycodone (OXY-IR) 5  MG capsule Take 5 mg by mouth every 6 (six) hours as needed for pain (breakthrough pain). Take 1 tab by mouth every six hours for pain.    [provider]  oxyCODONE (OXYCONTIN) 15 mg 12 hr tablet Take 15 mg by mouth every 12 (twelve) hours.    [provider]  pantoprazole (PROTONIX) 40 MG tablet Take 40 mg by mouth daily. 03/09/22   [provider]  polyethylene glycol (MIRALAX / GLYCOLAX) 17 g packet Take 17 g by mouth daily. Patient not taking: Reported on 06/13/2022    [provider]   senna-docusate (SENOKOT-S) 8.6-50 MG tablet Take 1 tablet by mouth 2 (two) times daily. 05/13/21   Allie Bossier, MD  thiamine 100 MG tablet Take 1 tablet (100 mg total) by mouth daily. 05/13/21   Allie Bossier, MD      Allergies    Codeine    Review of Systems   Review of Systems  Musculoskeletal:  Positive for myalgias.  Neurological:  Positive for weakness.  All other systems reviewed and are negative.   Physical Exam Updated Vital Signs BP 108/70 (BP Location: Left Arm)   Pulse (!) 115   Temp 98.9 F (37.2 C) (Oral)   Resp 17   SpO2 100%  Physical Exam Vitals and nursing note reviewed.  Constitutional:      General: He is not in acute distress.    Appearance: Normal appearance. He is ill-appearing.     Comments: Cachectic, chronically ill-appearing without any evidence of toxic, septic appearance  HENT:     Head: Normocephalic and atraumatic.  Eyes:     General:        Right eye: No discharge.        Left eye: No discharge.  Cardiovascular:     Rate and Rhythm: Regular rhythm. Tachycardia present.     Heart sounds: No murmur heard.    No friction rub. No gallop.  Pulmonary:     Effort: Pulmonary effort is normal.     Breath sounds: Normal breath sounds.     Comments: Patient with some decreased respiratory effort throughout, he has so rhonchi, questionable focal consolidation on the right, no stridor, wheezing, or acute respiratory distress at this time Abdominal:     General: Bowel sounds are normal.     Palpations: Abdomen is soft.  Skin:    General: Skin is warm and dry.     Capillary Refill: Capillary refill takes less than 2 seconds.  Neurological:     Mental Status: He is alert and oriented to person, place, and time.  Psychiatric:        Mood and Affect: Mood normal.        Behavior: Behavior normal.     ED Results / Procedures / Treatments   Labs (all labs ordered are listed, but only abnormal results are displayed) Labs Reviewed  RESP PANEL  BY RT-PCR (RSV, FLU A&B, COVID)  [id number]  CBC WITH DIFFERENTIAL/PLATELET  COMPREHENSIVE METABOLIC PANEL  ETHANOL  LACTIC ACID, PLASMA  LACTIC ACID, PLASMA  ACETAMINOPHEN LEVEL  SALICYLATE LEVEL  URINALYSIS, ROUTINE W REFLEX MICROSCOPIC  BRAIN NATRIURETIC PEPTIDE    EKG None  Radiology No results found.  Procedures Procedures  {Document cardiac monitor, telemetry assessment procedure when appropriate:1}  Medications Ordered in ED Medications  lactated ringers bolus 1,000 mL (has no administration in time range)  morphine (PF) 4 MG/ML injection 4 mg (has no administration in time range)  ondansetron (ZOFRAN)  injection 4 mg (has no administration in time range)    ED Course/ Medical Decision Making/ A&P   {   Click here for ABCD2, HEART and other calculatorsREFRESH Note before signing :1}                          Medical Decision Making Amount and/or Complexity of Data Reviewed Labs: ordered. Radiology: ordered.  Risk Prescription drug management.   This patient is a 67 y.o. male who presents to the ED for concern of weakness, failure to thrive, this involves an extensive number of treatment options, and is a complaint that carries with it a high risk of complications and morbidity. The emergent differential diagnosis prior to evaluation includes, but is not limited to,  *** . This is not an exhaustive differential.   Past Medical History / Co-morbidities / Social History: ***  Additional history: Chart reviewed. Pertinent results include: ***  Physical Exam: Physical exam performed. The pertinent findings include: ***  Lab Tests: I ordered, and personally interpreted labs.  The pertinent results include:  ***   Imaging Studies: I ordered imaging studies including ***. I independently visualized and interpreted imaging which showed ***. I agree with the radiologist interpretation.   Cardiac Monitoring:  The patient was maintained on a cardiac monitor.  My  attending physician Dr. [name] viewed and interpreted the cardiac monitored which showed an underlying rhythm of: ***. I agree with this interpretation.   Medications: I ordered medication including ***  for ***. Reevaluation of the patient after these medicines showed that the patient {resolved/improved/worsened:23923::"improved"}. I have reviewed the patients home medicines and have made adjustments as needed.  Consultations Obtained: I requested consultation with the ***,  and discussed lab and imaging findings as well as pertinent plan - they recommend: ***   Disposition: After consideration of the diagnostic results and the patients response to treatment, I feel that *** .   ***emergency department workup does not suggest an emergent condition requiring admission or immediate intervention beyond what has been performed at this time. The plan is: ***. The patient is safe for discharge and has been instructed to return immediately for worsening symptoms, change in symptoms or any other concerns.  I discussed this case with my attending physician Dr. [name] who cosigned this note including patient's presenting symptoms, physical exam, and planned diagnostics and interventions. Attending physician stated agreement with plan or made changes to plan which were implemented.    Final Clinical Impression(s) / ED Diagnoses Final diagnoses:  None    Rx / DC Orders ED Discharge Orders     None

## 2022-07-07 NOTE — Assessment & Plan Note (Addendum)
-   Patient presented with uncontrolled and worsening back pain despite ongoing home medications - CT shows progressive metastatic bone lesions with enlarging lytic lesions notably in L1 and L2 -Palliative care helping manage pain control

## 2022-07-07 NOTE — Assessment & Plan Note (Signed)
New finding on CT and large in size, secondary to malignancy He is not short of breath or hypoxic Depending on GOC, may benefit from therapeutic thoracentesis, but no urgent need at this time

## 2022-07-07 NOTE — Assessment & Plan Note (Signed)
67 year old male with history of stage 4 NSCL who presented to ED with 2 week history of N/V, not feeling well, little to no PO intake, weight loss and overall FTT -place in observation -treat UTI, f/u on culture -continue IVF as he is dehydrated and cachetic -resume ensure TID -check TSH, B12, B1 -He had his CT scan on 3/13 and has not heard any results. Discussed the extensive metastatic disease spread which likely contributing to his symptoms. Will have palliative care see about hospice which he is interested in. Will also send his oncologist a note and rounding team can consult oncology if appropriate

## 2022-07-08 DIAGNOSIS — C3492 Malignant neoplasm of unspecified part of left bronchus or lung: Secondary | ICD-10-CM | POA: Diagnosis not present

## 2022-07-08 DIAGNOSIS — G893 Neoplasm related pain (acute) (chronic): Secondary | ICD-10-CM | POA: Diagnosis present

## 2022-07-08 DIAGNOSIS — M8448XA Pathological fracture, other site, initial encounter for fracture: Secondary | ICD-10-CM | POA: Diagnosis present

## 2022-07-08 DIAGNOSIS — Z8249 Family history of ischemic heart disease and other diseases of the circulatory system: Secondary | ICD-10-CM | POA: Diagnosis not present

## 2022-07-08 DIAGNOSIS — M545 Low back pain, unspecified: Secondary | ICD-10-CM | POA: Diagnosis present

## 2022-07-08 DIAGNOSIS — Z7189 Other specified counseling: Secondary | ICD-10-CM | POA: Diagnosis not present

## 2022-07-08 DIAGNOSIS — R627 Adult failure to thrive: Secondary | ICD-10-CM | POA: Diagnosis present

## 2022-07-08 DIAGNOSIS — C787 Secondary malignant neoplasm of liver and intrahepatic bile duct: Secondary | ICD-10-CM | POA: Diagnosis present

## 2022-07-08 DIAGNOSIS — Q8740 Marfan's syndrome, unspecified: Secondary | ICD-10-CM | POA: Diagnosis not present

## 2022-07-08 DIAGNOSIS — J449 Chronic obstructive pulmonary disease, unspecified: Secondary | ICD-10-CM | POA: Diagnosis present

## 2022-07-08 DIAGNOSIS — Z515 Encounter for palliative care: Secondary | ICD-10-CM

## 2022-07-08 DIAGNOSIS — R64 Cachexia: Secondary | ICD-10-CM | POA: Diagnosis present

## 2022-07-08 DIAGNOSIS — Z66 Do not resuscitate: Secondary | ICD-10-CM | POA: Diagnosis present

## 2022-07-08 DIAGNOSIS — Z87891 Personal history of nicotine dependence: Secondary | ICD-10-CM | POA: Diagnosis not present

## 2022-07-08 DIAGNOSIS — N390 Urinary tract infection, site not specified: Secondary | ICD-10-CM | POA: Diagnosis present

## 2022-07-08 DIAGNOSIS — J90 Pleural effusion, not elsewhere classified: Secondary | ICD-10-CM | POA: Diagnosis present

## 2022-07-08 DIAGNOSIS — M549 Dorsalgia, unspecified: Secondary | ICD-10-CM | POA: Diagnosis not present

## 2022-07-08 DIAGNOSIS — F32A Depression, unspecified: Secondary | ICD-10-CM | POA: Diagnosis present

## 2022-07-08 DIAGNOSIS — E860 Dehydration: Secondary | ICD-10-CM | POA: Diagnosis present

## 2022-07-08 DIAGNOSIS — R52 Pain, unspecified: Secondary | ICD-10-CM | POA: Diagnosis present

## 2022-07-08 DIAGNOSIS — F411 Generalized anxiety disorder: Secondary | ICD-10-CM | POA: Diagnosis present

## 2022-07-08 DIAGNOSIS — C7951 Secondary malignant neoplasm of bone: Secondary | ICD-10-CM | POA: Diagnosis present

## 2022-07-08 DIAGNOSIS — Z1152 Encounter for screening for COVID-19: Secondary | ICD-10-CM | POA: Diagnosis not present

## 2022-07-08 DIAGNOSIS — K5900 Constipation, unspecified: Secondary | ICD-10-CM | POA: Diagnosis present

## 2022-07-08 DIAGNOSIS — E785 Hyperlipidemia, unspecified: Secondary | ICD-10-CM | POA: Diagnosis present

## 2022-07-08 DIAGNOSIS — E279 Disorder of adrenal gland, unspecified: Secondary | ICD-10-CM | POA: Diagnosis present

## 2022-07-08 DIAGNOSIS — F1010 Alcohol abuse, uncomplicated: Secondary | ICD-10-CM | POA: Diagnosis present

## 2022-07-08 LAB — BASIC METABOLIC PANEL
Anion gap: 7 (ref 5–15)
BUN: 16 mg/dL (ref 8–23)
CO2: 27 mmol/L (ref 22–32)
Calcium: 8.2 mg/dL — ABNORMAL LOW (ref 8.9–10.3)
Chloride: 104 mmol/L (ref 98–111)
Creatinine, Ser: 0.5 mg/dL — ABNORMAL LOW (ref 0.61–1.24)
GFR, Estimated: >60 mL/min (ref >60)
Glucose, Bld: 104 mg/dL — ABNORMAL HIGH (ref 70–99)
Potassium: 3.6 mmol/L (ref 3.5–5.1)
Sodium: 138 mmol/L (ref 135–145)

## 2022-07-08 LAB — CBC
HCT: 29.7 % — ABNORMAL LOW (ref 39.0–52.0)
Hemoglobin: 8.9 g/dL — ABNORMAL LOW (ref 13.0–17.0)
MCH: 25.1 pg — ABNORMAL LOW (ref 26.0–34.0)
MCHC: 30 g/dL (ref 30.0–36.0)
MCV: 83.9 fL (ref 80.0–100.0)
Platelets: 249 10*3/uL (ref 150–400)
RBC: 3.54 MIL/uL — ABNORMAL LOW (ref 4.22–5.81)
RDW: 15.9 % — ABNORMAL HIGH (ref 11.5–15.5)
WBC: 8.7 10*3/uL (ref 4.0–10.5)
nRBC: 0 % (ref 0.0–0.2)

## 2022-07-08 LAB — HIV ANTIBODY (ROUTINE TESTING W REFLEX): HIV Screen 4th Generation wRfx: NONREACTIVE

## 2022-07-08 MED ORDER — ONDANSETRON 4 MG PO TBDP: 4.0000 mg | ORAL_TABLET | Freq: Four times a day (QID) | ORAL | Status: DC | PRN

## 2022-07-08 MED ORDER — ONDANSETRON HCL 4 MG/2ML IJ SOLN: 4.0000 mg | Freq: Four times a day (QID) | INTRAMUSCULAR | Status: DC | PRN

## 2022-07-08 MED ORDER — POLYVINYL ALCOHOL 1.4 % OP SOLN: 1.0000 [drp] | Freq: Four times a day (QID) | OPHTHALMIC | Status: DC | PRN

## 2022-07-08 MED ORDER — MORPHINE SULFATE 1 MG/ML IV SOLN PCA
INTRAVENOUS | Status: DC
  Administered 2022-07-08: 1 mg via INTRAVENOUS
  Filled 2022-07-08: qty 30

## 2022-07-08 MED ORDER — LORAZEPAM 2 MG/ML IJ SOLN: 1.0000 mg | INTRAMUSCULAR | Status: DC | PRN

## 2022-07-08 MED ORDER — LORAZEPAM 2 MG/ML PO CONC: 1.0000 mg | ORAL | Status: DC | PRN

## 2022-07-08 MED ORDER — MORPHINE SULFATE (PF) 2 MG/ML IV SOLN
2.0000 mg | INTRAVENOUS | Status: DC | PRN
  Administered 2022-07-08: 2 mg via INTRAVENOUS
  Filled 2022-07-08: qty 1

## 2022-07-08 MED ORDER — LORAZEPAM 1 MG PO TABS: 1.0000 mg | ORAL_TABLET | ORAL | Status: DC | PRN

## 2022-07-08 MED ORDER — MORPHINE SULFATE 1 MG/ML IV SOLN PCA
INTRAVENOUS | Status: DC
  Administered 2022-07-08: 16.43 mg via INTRAVENOUS
  Administered 2022-07-09: 12.49 mg via INTRAVENOUS
  Administered 2022-07-09: 9.79 mg via INTRAVENOUS
  Filled 2022-07-08 (×2): qty 30

## 2022-07-08 MED ORDER — SODIUM CHLORIDE 0.9 % IV SOLN: 1.0000 g | INTRAVENOUS | Status: DC

## 2022-07-08 MED ORDER — MORPHINE BOLUS VIA INFUSION: 3.0000 mg | INTRAVENOUS | Status: DC | PRN

## 2022-07-08 MED ORDER — GLYCOPYRROLATE 0.2 MG/ML IJ SOLN: 0.2000 mg | Freq: Four times a day (QID) | INTRAMUSCULAR | Status: DC | PRN

## 2022-07-08 NOTE — Progress Notes (Signed)
Progress Note    Furious Writer   [id number]  DOB: Sep 14, 1955  DOA: 07/07/2022     0 PCP: [name], MD  Initial CC: uncontrolled pain, weakness  Hospital Course: Mr. 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 Other PMH Marfan syndrome, anxiety/depression, COPD. He presented to the hospital with generalized worsening pain, vomiting, weakness.  He stated he had stopped taking his [name] and has overall just been declining at home ([name]).  His pain medications have also not been adequately controlling pain anymore either.  He underwent testing with CT chest/abdomen/pelvis.  This showed new large right pleural effusion, new pulmonary nodules, progressive metastatic bone disease with enlarging lytic lesions involving L1/2, new hepatic disease, and new right adrenal gland disease.  Findings were discussed with patient and he was wishing for transitioning to hospice and foregoing any further treatments.  Palliative care and oncology were consulted on admission.  His status was changed to DNR and he was considered appropriate for transitioning to comfort care.  Patient was also amenable with this.  Interval History:  Patient seen this morning alongside with oncology as well.  He was able to clearly voice his wishes which include transitioning to DNR and pursuing hospice.  He was even interested in [facility].  His pain control is his biggest goal as he states he is having 9/10 pain even after pain medication this morning.  He understands he is declining rather rapidly and focus is now comfort care.  Assessment and Plan: * Failure to thrive in adult - Poor intake and ongoing nausea/vomiting with uncontrolled pain.  Overall progressive functional decline and not thriving well anymore -Goals of care have been discussed and CT findings also reviewed with patient.  He is amenable for transitioning to  hospice/comfort care at this time and discontinuing unnecessary medications -Oncology and palliative care have evaluated, appreciate assistance - Main focus now will be pain control and comfort  Non-small cell carcinoma of left lung, stage 4 (HCC) Current therapy: 1) Krazati p.o. daily.  stopped taking this 2 weeks ago and FTT Imaging shows progression of disease and has been discussed with him - patient also seen by palliative care and oncology on 3/16 - see FTT above as well - continue comfort care  Intractable back pain - Patient presented with uncontrolled and worsening back pain despite ongoing home medications - CT shows progressive metastatic bone lesions with enlarging lytic lesions notably in L1 and L2 -Palliative care helping manage pain control  Vomiting - Continue antiemetics and focusing on comfort  Pleural effusion on right New finding on CT and large in size, secondary to malignancy He is not short of breath or hypoxic - continue comfort  Asymptomatic bacteriuria-resolved as of 07/08/2022 - UA concerning for possible infection. Was started on Rocephin on admission. Given pursuit of comfort and patient asymptomatic, d/c rocephin for now and focus on comfort  GAD (generalized anxiety disorder) Stopped taking all medication x 2 weeks ago (buspar and hydroxyzine)    Old records reviewed in assessment of this patient  Antimicrobials:   DVT prophylaxis:     Code Status:   Code Status: DNR  Mobility Assessment (last 72 hours)     Mobility Assessment     Row Name 07/08/22 0721 07/07/22 2324         Does patient have an order for bedrest or is patient medically unstable No - Continue assessment No -  Continue assessment      What is the highest level of mobility based on the progressive mobility assessment? Level 1 (Bedfast) - Unable to balance while sitting on edge of bed Level 1 (Bedfast) - Unable to balance while sitting on edge of bed                Barriers to discharge:  Disposition Plan:  Possibly [facility] vs inpatient comfort care Status is: Obs, needs Inpt  Objective: Blood pressure 99/62, pulse 93, temperature 98 F (36.7 C), temperature source Oral, resp. rate 16, height 6\' 4"  (1.93 m), weight 63.7 kg, SpO2 94 %.  Examination:  Physical Exam Constitutional:      Comments: Very thin, chronically ill-appearing elderly gentleman lying in bed uncomfortable  HENT:     Head: Normocephalic and atraumatic.     Mouth/Throat:     Mouth: Mucous membranes are dry.  Eyes:     Extraocular Movements: Extraocular movements intact.  Cardiovascular:     Rate and Rhythm: Normal rate and regular rhythm.  Pulmonary:     Effort: Pulmonary effort is normal.     Breath sounds: Normal breath sounds.  Abdominal:     General: Bowel sounds are normal. There is no distension.     Palpations: Abdomen is soft.     Tenderness: There is no abdominal tenderness.  Musculoskeletal:        General: Normal range of motion.     Cervical back: Normal range of motion and neck supple.  Skin:    General: Skin is warm and dry.  Neurological:     General: No focal deficit present.     Mental Status: He is oriented to person, place, and time.  Psychiatric:        Mood and Affect: Mood normal.      Consultants:  Palliative care Oncology   Procedures:    Data Reviewed: Results for orders placed or performed during the hospital encounter of 07/07/22 (from the past 24 hour(s))  Urinalysis, Routine w reflex microscopic -Urine, Clean Catch     Status: Abnormal   Collection Time: 07/07/22  6:34 PM  Result Value Ref Range   Color, Urine AMBER (A) YELLOW   APPearance CLOUDY (A) CLEAR   Specific Gravity, Urine 1.025 1.005 - 1.030   pH 5.0 5.0 - 8.0   Glucose, UA NEGATIVE NEGATIVE mg/dL   Hgb urine dipstick NEGATIVE NEGATIVE   Bilirubin Urine MODERATE (A) NEGATIVE   Ketones, ur 20 (A) NEGATIVE mg/dL   Protein, ur 100 (A) NEGATIVE mg/dL    Nitrite POSITIVE (A) NEGATIVE   Leukocytes,Ua MODERATE (A) NEGATIVE   RBC / HPF 0-5 0 - 5 RBC/hpf   WBC, UA >50 0 - 5 WBC/hpf   Bacteria, UA MANY (A) NONE SEEN   Squamous Epithelial / HPF 0-5 0 - 5 /HPF   Mucus PRESENT    Ca Oxalate Crys, UA PRESENT   HIV Antibody (routine testing w rflx)     Status: None   Collection Time: 07/07/22 10:19 PM  Result Value Ref Range   HIV Screen 4th Generation wRfx Non Reactive Non Reactive  Magnesium     Status: None   Collection Time: 07/07/22 10:19 PM  Result Value Ref Range   Magnesium 2.0 1.7 - 2.4 mg/dL  TSH     Status: None   Collection Time: 07/07/22 10:19 PM  Result Value Ref Range   TSH 2.121 0.350 - 4.500 uIU/mL  Vitamin B12  Status: None   Collection Time: 07/07/22 10:19 PM  Result Value Ref Range   Vitamin B-12 486 180 - 914 pg/mL  Basic metabolic panel     Status: Abnormal   Collection Time: 07/08/22  6:09 AM  Result Value Ref Range   Sodium 138 135 - 145 mmol/L   Potassium 3.6 3.5 - 5.1 mmol/L   Chloride 104 98 - 111 mmol/L   CO2 27 22 - 32 mmol/L   Glucose, Bld 104 (H) 70 - 99 mg/dL   BUN 16 8 - 23 mg/dL   Creatinine, Ser 0.50 (L) 0.61 - 1.24 mg/dL   Calcium 8.2 (L) 8.9 - 10.3 mg/dL   GFR, Estimated >60 >60 mL/min   Anion gap 7 5 - 15  CBC     Status: Abnormal   Collection Time: 07/08/22  6:09 AM  Result Value Ref Range   WBC 8.7 4.0 - 10.5 K/uL   RBC 3.54 (L) 4.22 - 5.81 MIL/uL   Hemoglobin 8.9 (L) 13.0 - 17.0 g/dL   HCT 29.7 (L) 39.0 - 52.0 %   MCV 83.9 80.0 - 100.0 fL   MCH 25.1 (L) 26.0 - 34.0 pg   MCHC 30.0 30.0 - 36.0 g/dL   RDW 15.9 (H) 11.5 - 15.5 %   Platelets 249 150 - 400 K/uL   nRBC 0.0 0.0 - 0.2 %    I have reviewed pertinent nursing notes, vitals, labs, and images as necessary. I have ordered labwork to follow up on as indicated.  I have reviewed the last notes from staff over past 24 hours. I have discussed patient's care plan and test results with nursing staff, CM/SW, and other staff as  appropriate.  Time spent: Greater than 50% of the 55 minute visit was spent in counseling/coordination of care for the patient as laid out in the A&P.   LOS: 0 days   Dwyane Dee, MD [facility] 07/08/2022, 2:00 PM

## 2022-07-08 NOTE — Hospital Course (Signed)
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 Other PMH Marfan syndrome, anxiety/depression, COPD. He presented to the hospital with generalized worsening pain, vomiting, weakness.  He stated he had stopped taking his Christopher Dawson and has overall just been declining at home (Christopher Dawson).  His pain medications have also not been adequately controlling pain anymore either.  He underwent testing with CT chest/abdomen/pelvis.  This showed new large right pleural effusion, new pulmonary nodules, progressive metastatic bone disease with enlarging lytic lesions involving L1/2, new hepatic disease, and new right adrenal gland disease.  Findings were discussed with patient and he was wishing for transitioning to hospice and foregoing any further treatments.  Palliative care and oncology were consulted on admission.  His status was changed to DNR and he was considered appropriate for transitioning to comfort care.  Patient was also amenable with this.

## 2022-07-08 NOTE — Progress Notes (Signed)
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 I have reviewed his imaging studies The patient is noted to have significant disease progression He has very poor performance status and general weakness with failure to thrive He is in agreement we will transition his care to comfort measures only I will notify his primary oncologist about his admission  Failure to thrive Due to disease progression Agree for comfort measures  Cancer associated pain Patient palliative care consult with pain management  Code Status We discussed CODE STATUS treatment for change in CODE STATUS to DO NOT RESUSCITATE  Goals of care Comfort measures  Discharge planning The patient is an appropriate candidate for [facility]residential hospice facility   All questions were answered. The patient knows to call the clinic with any problems, questions or concerns.   The total time spent in the appointment was 55 minutes encounter with patients including review of chart and various tests results, discussions about plan of care and coordination of care plan  Heath Lark, MD 07/08/2022 11:13 AM  Subjective:  The patient follows with Dr. Julien Nordmann for stage IV lung cancer.  I have reviewed his electronic records.  Patient had difficulties keeping up with appointments and imaging study due to transportation issue residing in any residential skilled nursing facility. His last visit at the cancer center was in January His baseline is poor due to significant weakness secondary to pathological fractures significant metastatic disease to the spine Significant arrangement was made for him to get imaging study rescheduled multiple times.  He was supposed to go back to the cancer center next week for further follow-up  Yesterday, he was admitted through the emergency department due to failure to thrive, significant pain and vomiting.  He has significant  generalized weakness.   This morning, his pain is better controlled.  The patient is having goals of care discussion with primary service as I entered the room.  He appears in peace with his decision to transition care to comfort measures.  Objective:  Vitals:   07/08/22 0539 07/08/22 1036  BP: 114/69 99/62  Pulse: 97 93  Resp: 12 16  Temp: 98 F (36.7 C) 98 F (36.7 C)  SpO2: 94% 94%     Intake/Output Summary (Last 24 hours) at 07/08/2022 1113 Last data filed at 07/08/2022 [id number] Gross per 24 hour  Intake 1270.57 ml  Output 400 ml  Net 870.57 ml    GENERAL:alert, no distress and comfortable NEURO: alert & oriented x 3 with fluent speech, he looks thin and cachectic   Labs:  Recent Labs    03/27/22 0917 05/18/22 0940 07/07/22 1252 07/08/22 0609  NA 137 138 140 138  K 4.2 4.1 3.9 3.6  CL 103 104 107 104  CO2 27 30 23 27   GLUCOSE 98 79 98 104*  BUN 11 15 23 16   CREATININE 0.51* 0.56* 0.71 0.50*  CALCIUM 8.9 9.1 8.5* 8.2*  GFRNONAA >60 >60 >60 >60  PROT 6.4* 6.2* 6.5  --   ALBUMIN 3.5 3.3* 3.2*  --   AST 17 14* 20  --   ALT 15 8 12   --   ALKPHOS 64 75 82  --   BILITOT 0.4 0.5 0.9  --     Studies: I have personally reviewed his CT imaging DG Chest Portable 1 View  Result Date: 07/07/2022 CLINICAL DATA:  Shortness of breath, altered mental status EXAM: PORTABLE CHEST 1  VIEW COMPARISON:  Radiograph 06/29/2019, CT 07/05/2022 FINDINGS: There is a moderate size layering right pleural effusion with haziness of the right hemithorax. Known bilateral pulmonary nodules largest in the right peripheral and left peripheral lungs, best seen on prior chest CT. Unchanged cardiomediastinal silhouette. No pneumothorax. Bones are unchanged. IMPRESSION: Moderate size layering right pleural effusion with adjacent atelectasis. Known pulmonary nodules best evaluated on prior chest CT. Electronically Signed   By: Maurine Simmering M.D.   On: 07/07/2022 13:49   CT Abdomen Pelvis W Contrast  Result  Date: 07/07/2022 CLINICAL DATA:  Metastatic non-small cell lung cancer. Assess treatment response. * Tracking Code: BO * EXAM: CT CHEST, ABDOMEN, AND PELVIS WITH CONTRAST TECHNIQUE: Multidetector CT imaging of the chest, abdomen and pelvis was performed following the standard protocol during bolus administration of intravenous contrast. RADIATION DOSE REDUCTION: This exam was performed according to the departmental dose-optimization program which includes automated exposure control, adjustment of the mA and/or kV according to patient size and/or use of iterative reconstruction technique. CONTRAST:  110mL OMNIPAQUE IOHEXOL 300 MG/ML  SOLN COMPARISON:  Multiple previous imaging studies. The most recent CT scan is 02/15/2022. Prior PET-CT 06/15/2021 FINDINGS: CT CHEST FINDINGS Cardiovascular: The heart is normal in size. No pericardial effusion. The aorta is within normal limits in caliber. No dissection. The branch vessels are patent. Scattered coronary artery calcifications. Mediastinum/Nodes: Stable 9 mm right hilar lymph node. Scattered small mediastinal nodes are stable. The esophagus is grossly normal. The thyroid gland is unremarkable. Lungs/Pleura: New large right pleural effusion without obvious enhancing pleural nodules. There is a new right upper lobe pulmonary nodule measuring 18 mm on image 78/5. There is also a new subpleural left upper lobe pulmonary nodule measuring 15 mm on image 65/5. 5 mm pulmonary nodule in the left lower lobe on image number 100/5. Other smaller left lower lobe pulmonary nodules are noted. Findings consistent with metastatic disease. 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 Musculoskeletal: Progressive metastatic bone disease with enlarging lytic lesions most notably in the L1 and L2 vertebral bodies. CT ABDOMEN PELVIS FINDINGS Hepatobiliary: New hepatic metastatic disease. Adjacent 4.6 cm left hepatic lobe lesions anteriorly. Extensive  portal adenopathy may be invading the liver also. The gallbladder is unremarkable. No common bile duct dilatation. Pancreas: Marked atrophy of the pancreas which is surrounded by bulky adenopathy. Spleen: Normal size.  No focal lesions. Adrenals/Urinary Tract: New 18 mm right thyroid nodule consistent with metastatic disease. The left adrenal gland is. Both kidneys are grossly normal. The bladder is normal. Stomach/Bowel: The stomach, duodenum small bowel colon are grossly normal. Large amount of stool in the rectum could suggest fecal impaction. Vascular/Lymphatic: The aorta and branch vessels are patent. The major venous structures are patent. The IVC is compressed and displaced by bulky adenopathy. Massive mesenteric, celiac axis, periportal and retroperitoneal partially necrotic appearing adenopathy. Some of the portal adenopathy may be directly invading the liver. The periportal/mesenteric nodal mass measures approximately 12.6 x 5.5 cm on image 59/3. Partially necrotic inter aortocaval nodal lesion measures 4.2 x 4.2 cm on image number 69/3. Lower right-sided retroperitoneal nodal mass measures 6.6 x 6.6 cm on image 77/3. The IVC is compressed and displaced but not occluded or thrombosed. The renal veins are still patent displaced. Reproductive: The prostate gland and seminal vesicles are grossly normal. Other: No ascites or abdominal wall hernia. Musculoskeletal: Progressive lytic metastatic bone disease lesions notably involving the L1 and L2 vertebral bodies. IMPRESSION: 1. New large right pleural effusion  without obvious enhancing pleural nodules. 2. New pulmonary nodules consistent with metastatic disease. 3. Progressive metastatic bone disease with enlarging lytic lesions most notably in the L1 and L2 vertebral bodies. 4. New hepatic metastatic disease. 5. Massive mesenteric, celiac axis, periportal and retroperitoneal partially necrotic appearing adenopathy. Some of the portal adenopathy may be directly  invading the liver. 6. New right adrenal gland nodule consistent with metastatic disease. Electronically Signed   By: Marijo Sanes M.D.   On: 07/07/2022 11:14   CT Chest W Contrast  Result Date: 07/07/2022 CLINICAL DATA:  Metastatic non-small cell lung cancer. Assess treatment response. * Tracking Code: BO * EXAM: CT CHEST, ABDOMEN, AND PELVIS WITH CONTRAST TECHNIQUE: Multidetector CT imaging of the chest, abdomen and pelvis was performed following the standard protocol during bolus administration of intravenous contrast. RADIATION DOSE REDUCTION: This exam was performed according to the departmental dose-optimization program which includes automated exposure control, adjustment of the mA and/or kV according to patient size and/or use of iterative reconstruction technique. CONTRAST:  160mL OMNIPAQUE IOHEXOL 300 MG/ML  SOLN COMPARISON:  Multiple previous imaging studies. The most recent CT scan is 02/15/2022. Prior PET-CT 06/15/2021 FINDINGS: CT CHEST FINDINGS Cardiovascular: The heart is normal in size. No pericardial effusion. The aorta is within normal limits in caliber. No dissection. The branch vessels are patent. Scattered coronary artery calcifications. Mediastinum/Nodes: Stable 9 mm right hilar lymph node. Scattered small mediastinal nodes are stable. The esophagus is grossly normal. The thyroid gland is unremarkable. Lungs/Pleura: New large right pleural effusion without obvious enhancing pleural nodules. There is a new right upper lobe pulmonary nodule measuring 18 mm on image 78/5. There is also a new subpleural left upper lobe pulmonary nodule measuring 15 mm on image 65/5. 5 mm pulmonary nodule in the left lower lobe on image number 100/5. Other smaller left lower lobe pulmonary nodules are noted. Findings consistent with metastatic disease. 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 Musculoskeletal: Progressive metastatic bone disease with enlarging lytic  lesions most notably in the L1 and L2 vertebral bodies. CT ABDOMEN PELVIS FINDINGS Hepatobiliary: New hepatic metastatic disease. Adjacent 4.6 cm left hepatic lobe lesions anteriorly. Extensive portal adenopathy may be invading the liver also. The gallbladder is unremarkable. No common bile duct dilatation. Pancreas: Marked atrophy of the pancreas which is surrounded by bulky adenopathy. Spleen: Normal size.  No focal lesions. Adrenals/Urinary Tract: New 18 mm right thyroid nodule consistent with metastatic disease. The left adrenal gland is. Both kidneys are grossly normal. The bladder is normal. Stomach/Bowel: The stomach, duodenum small bowel colon are grossly normal. Large amount of stool in the rectum could suggest fecal impaction. Vascular/Lymphatic: The aorta and branch vessels are patent. The major venous structures are patent. The IVC is compressed and displaced by bulky adenopathy. Massive mesenteric, celiac axis, periportal and retroperitoneal partially necrotic appearing adenopathy. Some of the portal adenopathy may be directly invading the liver. The periportal/mesenteric nodal mass measures approximately 12.6 x 5.5 cm on image 59/3. Partially necrotic inter aortocaval nodal lesion measures 4.2 x 4.2 cm on image number 69/3. Lower right-sided retroperitoneal nodal mass measures 6.6 x 6.6 cm on image 77/3. The IVC is compressed and displaced but not occluded or thrombosed. The renal veins are still patent displaced. Reproductive: The prostate gland and seminal vesicles are grossly normal. Other: No ascites or abdominal wall hernia. Musculoskeletal: Progressive lytic metastatic bone disease lesions notably involving the L1 and L2 vertebral bodies. IMPRESSION: 1. New large right pleural effusion without  obvious enhancing pleural nodules. 2. New pulmonary nodules consistent with metastatic disease. 3. Progressive metastatic bone disease with enlarging lytic lesions most notably in the L1 and L2 vertebral  bodies. 4. New hepatic metastatic disease. 5. Massive mesenteric, celiac axis, periportal and retroperitoneal partially necrotic appearing adenopathy. Some of the portal adenopathy may be directly invading the liver. 6. New right adrenal gland nodule consistent with metastatic disease. Electronically Signed   By: Marijo Sanes M.D.   On: 07/07/2022 11:14

## 2022-07-08 NOTE — Consult Note (Addendum)
Consultation Note Date: 07/08/2022   Patient Name: Winter Bicksler  DOB: 1956/03/02  MRN: [id number]  Age / Sex: 67 y.o., male  PCP: [name], MD Referring Physician: Dwyane Dee, MD  Reason for Consultation:   HPI/Patient Profile: 67 y.o. 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 Admitted to [facility] on 07/07/2022 with pain, N/V, and FTT. He has a past medical history of depression, tobacco abuse, anxiety, Marfan syndrome, ETOH, and COPD.   Mr. Hynes has been followed by Oncology and CT scan on 07/05/2022 confirmed the rapid progression of his cancer. Dr. Alvy Bimler met with patient this morning and he elected to pursue comfort care.  Primary Decision Maker NEXT OF KIN Garnette Czech, mother  Discussion: Extensive medical record review and discussion with attending, bedside nursing staff, and palliative care team prior to visit.   Presented to bedside for visit. Mr. Likes is alone, no family at bedside. Empathetic listening provided as patient reflects on the deaths of his father and brother - both died from cancer. His father died about 20 years ago and his brother's death was 2 years ago. Patient tearful as he acknowledges that he was just trying to get over the death of his brother when his own journey with cancer began. He explains that it has been "especially hard on mom."  Discussed patient's current pain and interventions. Within the 30 minutes prior to visiting patient, he had received a PRN dose of Morphine 2mg  IV. He reports that his pain decreased from a 9 to an 8 out of 10. He is having difficulty taking deep breaths due to the pain. He describes severe pain to his lower back and legs, especially his knees and ankles.  Education provided to patient on benefits of a Morphine PCA - that he will be able to have constant medication delivery with option to push a button  for more. Patient agreeable to and appreciative for this option.  1355 - Palliative return visit to room. IV Morphine PCA with basal dose at 1 mg/hr and bolus at 2 mg q 12 min PRN breakthrough pain. Clinician bolus available at 2 mg q 2 hr PRN pain, dyspnea. Patient able to articulate pain but unable to remember to push the bolus button.   Will increase settings to IV Morphine PCA with basal dose at 2 mg/hr and bolus at 3 mg q 12 min PRN breakthrough pain. Clinician bolus available at 3 mg q 30 min PRN pain, dyspnea, or RR >25.  SUMMARY OF RECOMMENDATIONS   Pain, acute (in the setting rapidly progressing lung cancer metastatic to liver and bone) -IV Morphine PCA with basal dose at 2 mg/hr and bolus at 3 mg q 12 min PRN breakthrough pain. Clinician bolus available at 3 mg q 30 min PRN pain, dyspnea, or RR >25. -Discontinue fentanyl patch and OxyContin as all pain medications to be given through PCA.   Dyspnea -See IV Morphine PCA orders.  Anxiety -IV Lorazepam 1 mg q 4 hr PRN  anxiety.  -Comfort medications in place for symptoms that may arise including IV Robinul 0.2 mg q 4 hr PRN terminal congestion/secretions and IV Zofran 4 mg q 6 hr PRN nausea.  Code Status/Advance Care Planning - DNR/DNI - Total comfort care - Discontinue antibiotics and medications not contributing to patient's overall comfort. - TOC consult placed to assist with hospice referrals   Prognosis:   Days to weeks  Discharge Planning: Inpatient hospice facility   Primary Diagnoses: Present on Admission:  (Resolved) Depression  GAD (generalized anxiety disorder)  (Resolved) Hyperlipidemia  Intractable back pain  (Resolved) COPD (chronic obstructive pulmonary disease) ([place])  Non-small cell carcinoma of left lung, stage 4 (HCC)  Failure to thrive in adult   Review of Systems  Musculoskeletal:  Positive for arthralgias and back pain.    Physical Exam Constitutional:      Appearance: He is cachectic. He is  ill-appearing.  Pulmonary:     Breath sounds: Decreased air movement present.     Comments: Restricted breaths secondary to pain. Musculoskeletal:     Comments: Extremities stiff.  Skin:    General: Skin is warm.     Coloration: Skin is pale.     Comments: Dry and flaky.  Neurological:     Motor: Weakness present.     Vital Signs: BP 99/62 (BP Location: Right Arm)   Pulse 93   Temp 98 F (36.7 C) (Oral)   Resp 16   Ht 6\' 4"  (1.93 m)   Wt 63.7 kg   SpO2 94%   BMI 17.09 kg/m  Pain Scale: 0-10   Pain Score: 8    SpO2: SpO2: 94 % O2 Device:SpO2: 94 % O2 Flow Rate: .O2 Flow Rate (L/min): 2 L/min  IO: Intake/output summary:  Intake/Output Summary (Last 24 hours) at 07/08/2022 1247 Last data filed at 07/08/2022 0721 Gross per 24 hour  Intake 1739.57 ml  Output 400 ml  Net 1339.57 ml    LBM: Last BM Date : 07/07/22 Baseline Weight: Weight: 63.7 kg Most recent weight: Weight: 63.7 kg       Thank you for this consult. Palliative medicine will continue to follow and assist as needed.   Signed by: Moss Mc, RN MSN [facility] / NP Student Palliative Care Team   If patient remains symptomatic despite maximum doses, please call PMT at [phone] between 0700 and 1900. Outside of these hours, please call attending, as PMT does not have night coverage.   I have reviewed, seen, examined, and discussed the care of this patient in detail with the Moss Mc, RN MSN [facility] / NP Student including pertinent patient records, physical exam findings, and data. I have updated the note accordingly and agree with details of the encounter stated in above note(GC).   Patient assessed and having severe pain in the setting of metastatic NSCLC to liver, bone. Patient's focus is on comfort care only at this time. Started and adjusting IV morphine PCA based on patient's symptom burden. Likely to need inpatient hospice for management; [facility] referral placed.    Chelsea Aus, DO Palliative  Care Provider [phone]

## 2022-07-09 DIAGNOSIS — R627 Adult failure to thrive: Secondary | ICD-10-CM | POA: Diagnosis not present

## 2022-07-09 DIAGNOSIS — G893 Neoplasm related pain (acute) (chronic): Secondary | ICD-10-CM | POA: Diagnosis not present

## 2022-07-09 DIAGNOSIS — Z515 Encounter for palliative care: Secondary | ICD-10-CM | POA: Diagnosis not present

## 2022-07-09 DIAGNOSIS — Z7189 Other specified counseling: Secondary | ICD-10-CM | POA: Diagnosis not present

## 2022-07-09 DIAGNOSIS — R1110 Vomiting, unspecified: Secondary | ICD-10-CM

## 2022-07-09 DIAGNOSIS — Z79899 Other long term (current) drug therapy: Secondary | ICD-10-CM

## 2022-07-09 DIAGNOSIS — C3492 Malignant neoplasm of unspecified part of left bronchus or lung: Secondary | ICD-10-CM | POA: Diagnosis not present

## 2022-07-09 DIAGNOSIS — M549 Dorsalgia, unspecified: Secondary | ICD-10-CM | POA: Diagnosis not present

## 2022-07-09 MED ORDER — DEXAMETHASONE SODIUM PHOSPHATE 4 MG/ML IJ SOLN
4.0000 mg | Freq: Every day | INTRAMUSCULAR | Status: DC
  Administered 2022-07-09 – 2022-07-10 (×2): 4 mg via INTRAVENOUS
  Filled 2022-07-09 (×2): qty 1

## 2022-07-09 MED ORDER — SENNA 8.6 MG PO TABS
2.0000 | ORAL_TABLET | Freq: Every day | ORAL | Status: DC
  Administered 2022-07-10: 17.2 mg via ORAL
  Filled 2022-07-09 (×2): qty 2

## 2022-07-09 MED ORDER — MORPHINE SULFATE 1 MG/ML IV SOLN PCA
INTRAVENOUS | Status: DC
  Administered 2022-07-09: 14.35 mg via INTRAVENOUS
  Administered 2022-07-09: 11.64 mg via INTRAVENOUS
  Administered 2022-07-10: 21.33 mg via INTRAVENOUS
  Administered 2022-07-10: 6.03 mg via INTRAVENOUS
  Administered 2022-07-10: 36.71 mg via INTRAVENOUS
  Administered 2022-07-10: 5.39 mg via INTRAVENOUS
  Filled 2022-07-09 (×4): qty 30

## 2022-07-09 NOTE — Progress Notes (Signed)
[facility] [id number] [facility]) Hospital Liaison Note   Received request from [place] for patient interest in [facility]. Assessed patient at bedside and called sister, Jule Ser to acknowledge referral, confirm interest and explain services. Eligibility pending.  Unfortunately, [facility] is not able to offer a room today. Will follow up tomorrow for availability and eligibility.   Please call with any hospice related questions or concerns.    Thank you, Margaretmary Eddy, BSN, RN [facility] Liaison [phone]

## 2022-07-09 NOTE — TOC Initial Note (Signed)
Transition of Care [facility]) - Initial/Assessment Note    Patient Details  Name: Christopher Dawson MRN: [id number] Date of Birth: 20-Nov-1955  Transition of [facility]) CM/SW Contact:    Lennart Pall, LCSW Phone Number: 07/09/2022, 2:14 PM  Clinical Narrative:                  Met with pt to review TOC referral to assist with residential hospice placement.  Pt very pleasant and states he is in full agreement with this plan and preference would be [facility].  Have placed referral for [facility] to evaluate for residential placement.  Expected Discharge Plan: [place] Barriers to Discharge: Continued Medical Work up   Patient Goals and CMS Choice Patient states their goals for this hospitalization and ongoing recovery are:: to dc to [facility] if possible   Choice offered to / list presented to : Patient      Expected Discharge Plan and Services In-house Referral: Clinical Social Work   Post Acute Care Choice: Residential Hospice Bed Living arrangements for the past 2 months: [place] (has been LTC resident at [facility])                 DME Arranged: N/A DME Agency: NA                  Prior Living Arrangements/Services Living arrangements for the past 2 months: [place] (has been LTC resident at [facility]) Lives with:: Facility Resident Patient language and need for interpreter reviewed:: Yes        Need for Family Participation in Patient Care: No (Comment) Care giver support system in place?: Yes (comment)   Criminal Activity/Legal Involvement Pertinent to Current Situation/Hospitalization: No - Comment as needed  Activities of Daily Living Home Assistive Devices/Equipment: None ADL Screening (condition at time of admission) Patient's cognitive ability adequate to safely complete daily activities?: Yes Is the patient deaf or have difficulty hearing?: No Does the patient have difficulty seeing, even when wearing  glasses/contacts?: No Does the patient have difficulty concentrating, remembering, or making decisions?: No Patient able to express need for assistance with ADLs?: Yes Does the patient have difficulty dressing or bathing?: Yes Independently performs ADLs?: No Communication: Independent Dressing (OT): Dependent Is this a change from baseline?: Pre-admission baseline Grooming: Needs assistance Is this a change from baseline?: Pre-admission baseline Feeding: Independent Bathing: Dependent Is this a change from baseline?: Pre-admission baseline Toileting: Dependent Is this a change from baseline?: Pre-admission baseline In/Out Bed: Dependent Is this a change from baseline?: Pre-admission baseline Walks in Home: Dependent Is this a change from baseline?: Pre-admission baseline Does the patient have difficulty walking or climbing stairs?: Yes Weakness of Legs: Both Weakness of Arms/Hands: None  Permission Sought/Granted Permission sought to share information with : Family Supports, Customer service manager Permission granted to share information with : Yes, Verbal Permission Granted  Share Information with NAME: mother, Kelso Schnack [phone]  Permission granted to share info w AGENCY: [facility]        Emotional Assessment Appearance:: Appears older than stated age Attitude/Demeanor/Rapport: Gracious Affect (typically observed): Accepting Orientation: : Oriented to Self, Oriented to Place, Oriented to Situation, Oriented to  Time Alcohol / Substance Use: Not Applicable Psych Involvement: No (comment)  Admission diagnosis:  Lower urinary tract infectious disease [N39.0] Failure to thrive in adult [R62.7] Intractable vomiting [R11.10] Adult failure to thrive [R62.7] Patient Active Problem List   Diagnosis Date Noted   High risk medication use  07/09/2022   Intractable vomiting 07/09/2022   DNR (do not resuscitate) 07/08/2022   Counseling and coordination of care  07/08/2022   Cancer associated pain 07/08/2022   End of life care 07/08/2022   Adult failure to thrive 07/08/2022   Failure to thrive in adult 07/07/2022   Pleural effusion on right 07/07/2022   At risk for falling 06/13/2022   Palliative care encounter 05/19/2022   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 ([place]) 02/28/2022   Urine discoloration 11/15/2021   Pruritus of skin 11/15/2021   Pneumonia 09/01/2021   Anemia due to antineoplastic chemotherapy 09/01/2021   Acute cough 06/27/2021   Goals of care, counseling/discussion 06/27/2021   Non-small cell carcinoma of left lung, stage 4 ([place]) 06/16/2021   Encounter for antineoplastic chemotherapy 06/16/2021   Encounter for antineoplastic immunotherapy 06/16/2021   Hyponatremia 05/05/2021   Cancer related pain 05/05/2021   Constipation 05/05/2021   Right adrenal mass ([place]) 05/05/2021   Alcohol use 05/05/2021   Intractable back pain 04/23/2021   Elevated BP without diagnosis of hypertension 11/25/2020   Poor social situation 11/25/2020   Alcohol withdrawal syndrome without complication ([place])    H/O alcohol abuse 05/09/2017   Hypocalcemia 05/09/2017   Leukopenia 05/09/2017   Thrombocytopenia ([place]) 05/09/2014   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   Abnormality of gait 04/12/2013   Tobacco use disorder 07/04/2012   GAD (generalized anxiety disorder) 07/04/2012   Major depressive disorder, recurrent episode ([place]) 07/04/2012   PCP:  [name], MD Pharmacy:   [facility] [place], [place] - [address] AT [facility] [address] [place] [address] Phone: [phone] Fax: [place] [address] Phone: [phone] Fax: [phone]  [place], [place] - [address] [place] [address] Phone: [phone] Fax:  [phone]     Social Determinants of Health (SDOH) Social History: SDOH Screenings   Food Insecurity: No Food Insecurity (07/08/2022)  Housing: Low Risk  (07/08/2022)  Transportation Needs: No Transportation Needs (07/08/2022)  Utilities: Not At Risk (07/08/2022)  Depression (PHQ2-9): Medium Risk (11/25/2020)  Tobacco Use: Medium Risk (07/07/2022)   SDOH Interventions:     Readmission Risk Interventions     No data to display

## 2022-07-09 NOTE — Progress Notes (Signed)
Daily Progress Note   Patient Name: Christopher Dawson       Date: 07/09/2022 DOB: 24-Jul-1955  Age: 67 y.o. MRN#: [id number] Attending Physician: Dwyane Dee, MD Primary Care Physician: [name], MD Admit Date: 07/07/2022 Length of Stay: 1 day  Reason for Consultation/Follow-up: Establishing goals of care and Symptom Management  Subjective:   CC: Patient feels that pain is better than yesterday though still not well-controlled.  Following up regarding complex medical decision making and symptom management.  Subjective:  Extensive review of EMR prior to seeing patient.  At time of EMR review, patient had received IV morphine 3 mg boluses x 5.  Patient has continued to received 2 mg IV morphine basal dosing continuously.  Presented to bedside to check on patient.  Patient laying in bed.  Patient notes that his pain is overall decreased from yesterday though still feels lots of bone pain and that pain is not well-controlled.  Discussed based on his bolus dosing, will increase basal dosing at this time.  Will also increase bolus dosing as needed.  Patient seems to have difficulty remembering to push PCA button so we will see how he does today and then may need to transition to nurse based bolusing instead if pain still not well-controlled.  Also discussed addition of IV steroids to assist with inflammation and pain management.  Patient willing to try at this time.  His only other symptom complaint is nausea and vomiting.  Noted would ask nurse to give dose of Zofran to determine if this helps.  Also hoping dexamethasone will help with this symptom.  Spent time discussing patient's care moving forward.  Patient still wants involvement of hospice and to just focus on comfort at the end of his life.  Acknowledged this and that will continue to be our focus while he is here.  Have involved social worker to assist with hospice coordination.  Spent time providing emotional support via active  listening.  Thanked patient for allowing me to visit with him today.  Review of Systems  Objective:   Vital Signs:  BP (!) 111/59 (BP Location: Left Arm)   Pulse 98   Temp (!) 97.4 F (36.3 C) (Oral)   Resp 16   Ht 6\' 4"  (1.93 m)   Wt 63.7 kg   SpO2 (!) 89%   BMI 17.09 kg/m   Physical Exam: General: NAD, awake, laying in bed, frail, chronically ill appearing Eyes: no drainage noted HENT: Dry mucous membranes Cardiovascular: RRR, no edema in LE b/l Respiratory: no increased work of breathing noted, not in respiratory distress Abdomen: not distended Extremities: Muscle wasting present in all extremities Skin: no rashes or lesions on visible skin Neuro: A&Ox4, following commands easily Psych: appropriately answers all questions  Imaging:  I personally reviewed recent imaging.   Assessment & Plan:   Assessment:  67 y.o. 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 Ad[facility] to [facility] on 07/07/2022 with pain, N/V, and FTT. He has a past medical history of depression, tobacco abuse, anxiety, Marfan syndrome, ETOH, and COPD.  Mr. Gessert has been followed by Oncology and CT scan on 07/05/2022 confirmed the rapid progression of his cancer. Dr. Alvy Bimler met with patient on 3/15 and he elected for comfort care.  Palliative medicine team consulted to assist with complex medical decision making and symptom management.  Recommendations/Plan: # Complex medical decision making/goals of care:  - Patient has clearly stated that he wants to focus on comfort  focused care at the end of his life in the setting of metastatic lung cancer. Patient agreeing with wanting hospice involvement at this time.  TOC already consulted to assist with hospice referrals.  -  Code Status: DNR Prognosis: days-weeks  # Symptom management:  -Pain, acute (in the setting rapidly progressing lung cancer metastatic to liver and bone) -Increase IV Morphine PCA basal dose to 3 mg/hr and bolus to  4 mg q 12 min PRN breakthrough pain. Clinician bolus available at 3 mg q 30 min PRN pain, dyspnea, or RR >25.    - Add IV dexamethasone 4mg  daily    - Anxiety -Continue IV Lorazepam 1 mg q 4 hr PRN anxiety.  - Nausea/vomiting - Adding IV dexamethasone as described above -Has prn Zofran available   -Constipation  To remain on bowel regimen while receiving opioids   -Start senna tabs daily  # Discharge Planning: referral to hospice. Likely need inpatient hospice based on symptom burden.   Discussed with: hospitalist, patient, bedside RN  Thank you for allowing the palliative care team to participate in the care [name].  Chelsea Aus, DO Palliative Care Provider PMT # [phone]  If patient remains symptomatic despite maximum doses, please call PMT at [phone] between 0700 and 1900. Outside of these hours, please call attending, as PMT does not have night coverage.  *Please note that this is a verbal dictation therefore any spelling or grammatical errors are due to the "[employer] [employer]" system interpretation.

## 2022-07-09 NOTE — Progress Notes (Signed)
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 Other PMH Marfan syndrome, anxiety/depression, COPD. He presented to the hospital with generalized worsening pain, vomiting, weakness.  He stated he had stopped taking his Christopher Dawson and has overall just been declining at home (Christopher Dawson).  His pain medications have also not been adequately controlling pain anymore either.  He underwent testing with CT chest/abdomen/pelvis.  This showed new large right pleural effusion, new pulmonary nodules, progressive metastatic bone disease with enlarging lytic lesions involving L1/2, new hepatic disease, and new right adrenal gland disease.  Findings were discussed with patient and he was wishing for transitioning to hospice and foregoing any further treatments.  Palliative care and oncology were consulted on admission.  His status was changed to DNR and he was considered appropriate for transitioning to comfort care.  Patient was also amenable with this.  Interval History:  No events overnight.  He is much more comfortable this morning with much better pain control after being started on morphine PCA by palliative care yesterday. Plan remains for continuing comfort care and he will either be inpatient hospice versus going to residential hospice.  Assessment and Plan: * Failure to thrive in adult - Poor intake and ongoing nausea/vomiting with uncontrolled pain.  Overall progressive functional decline and not thriving well anymore -Goals of care have been discussed and CT findings also reviewed with patient.  He is amenable for transitioning to hospice/comfort care at this time and discontinuing unnecessary medications -Oncology and palliative care have  evaluated, appreciate assistance - Main focus now will be pain control and comfort -Continue pain control as per palliative care.  Tentative plan for pursuing residential hospice versus inpatient hospice  Non-small cell carcinoma of left lung, stage 4 ([place]) Current therapy: 1) [place] p.o. daily.  stopped taking this 2 weeks ago and FTT Imaging shows progression of disease and has been discussed with him - patient also seen by palliative care and oncology on 3/16 - see FTT above as well - continue comfort care  Intractable back pain - Patient presented with uncontrolled and worsening back pain despite ongoing home medications - CT shows progressive metastatic bone lesions with enlarging lytic lesions notably in L1 and L2 -Palliative care helping manage pain control  Vomiting-resolved as of 07/09/2022 - Continue antiemetics and focusing on comfort  Pleural effusion on right New finding on CT and large in size, secondary to malignancy He is not short of breath or hypoxic - continue comfort  Asymptomatic bacteriuria-resolved as of 07/08/2022 - UA concerning for possible infection. Was started on Rocephin on admission. Given pursuit of comfort and patient asymptomatic, d/c rocephin for now and focus on comfort  GAD (generalized anxiety disorder) Stopped taking all medication x 2 weeks ago (buspar and hydroxyzine)    Old records reviewed in assessment of this patient  Antimicrobials:   DVT prophylaxis:     Code Status:   Code Status: DNR  Mobility Assessment (last 72 hours)     Mobility Assessment     Row Name 07/08/22 2035 07/08/22 0721 07/07/22 2324       Does patient have an order for bedrest or is patient medically unstable No - Continue assessment No - Continue assessment No - Continue  assessment     What is the highest level of mobility based on the progressive mobility assessment? Level 1 (Bedfast) - Unable to balance while sitting on edge of bed Level 1 (Bedfast) -  Unable to balance while sitting on edge of bed Level 1 (Bedfast) - Unable to balance while sitting on edge of bed              Barriers to discharge:  Disposition Plan:  Possibly [facility] vs inpatient comfort care Status is: Inpt  Objective: Blood pressure (!) 111/59, pulse 98, temperature (!) 97.4 F (36.3 C), temperature source Oral, resp. rate 16, height 6\' 4"  (1.93 m), weight 63.7 kg, SpO2 (!) 89 %.  Examination:  Physical Exam Constitutional:      Comments: Chronically ill-appearing elderly gentleman lying in bed now appearing much more comfortable  HENT:     Head: Normocephalic and atraumatic.     Mouth/Throat:     Mouth: Mucous membranes are dry.  Eyes:     Extraocular Movements: Extraocular movements intact.  Cardiovascular:     Rate and Rhythm: Normal rate and regular rhythm.  Pulmonary:     Effort: Pulmonary effort is normal.     Breath sounds: Normal breath sounds.  Abdominal:     General: Bowel sounds are normal. There is no distension.     Palpations: Abdomen is soft.     Tenderness: There is no abdominal tenderness.  Musculoskeletal:        General: Normal range of motion.     Cervical back: Normal range of motion and neck supple.  Skin:    General: Skin is warm and dry.  Neurological:     General: No focal deficit present.     Mental Status: He is oriented to person, place, and time.  Psychiatric:        Mood and Affect: Mood normal.      Consultants:  Palliative care Oncology   Procedures:    Data Reviewed: No results found for this or any previous visit (from the past 24 hour(s)).   I have reviewed pertinent nursing notes, vitals, labs, and images as necessary. I have ordered labwork to follow up on as indicated.  I have reviewed the last notes from staff over past 24 hours. I have discussed patient's care plan and test results with nursing staff, CM/SW, and other staff as appropriate.  Time spent: Greater than 50% of the 55 minute  visit was spent in counseling/coordination of care for the patient as laid out in the A&P.   LOS: 1 day   Christopher Dee, MD [facility] 07/09/2022, 11:41 AM

## 2022-07-10 ENCOUNTER — Inpatient Hospital Stay: Payer: Medicare Other | Attending: Physician Assistant

## 2022-07-10 ENCOUNTER — Inpatient Hospital Stay: Payer: Medicare Other | Admitting: Physician Assistant

## 2022-07-10 ENCOUNTER — Other Ambulatory Visit: Payer: Self-pay

## 2022-07-10 DIAGNOSIS — M549 Dorsalgia, unspecified: Secondary | ICD-10-CM

## 2022-07-10 DIAGNOSIS — Z515 Encounter for palliative care: Secondary | ICD-10-CM

## 2022-07-10 DIAGNOSIS — Z79899 Other long term (current) drug therapy: Secondary | ICD-10-CM

## 2022-07-10 DIAGNOSIS — R627 Adult failure to thrive: Secondary | ICD-10-CM

## 2022-07-10 DIAGNOSIS — C3492 Malignant neoplasm of unspecified part of left bronchus or lung: Secondary | ICD-10-CM

## 2022-07-10 DIAGNOSIS — Z7189 Other specified counseling: Secondary | ICD-10-CM

## 2022-07-10 DIAGNOSIS — G893 Neoplasm related pain (acute) (chronic): Principal | ICD-10-CM

## 2022-07-10 DIAGNOSIS — Z66 Do not resuscitate: Secondary | ICD-10-CM

## 2022-07-10 DIAGNOSIS — R1111 Vomiting without nausea: Secondary | ICD-10-CM

## 2022-07-10 MED ORDER — DEXAMETHASONE 4 MG PO TABS: 4.0000 mg | ORAL_TABLET | Freq: Every day | ORAL | 0 refills | Status: DC

## 2022-07-10 NOTE — TOC Transition Note (Addendum)
Transition of Care [facility]) - CM/SW Discharge Note   Patient Details  Name: Christopher Dawson MRN: [id number] Date of Birth: Sep 22, 1955  Transition of Care [facility]) CM/SW Contact:  Angelita Ingles, RN Phone Number:[phone]  07/10/2022, 1:39 PM   Clinical Narrative:    CM has arranged 3:00pm pickup for discharge to [facility]. CM attempted to update sister. Sister does not answer phone. Message has been left for Jule Ser [phone]. Discharge packet is at nurses station.      Barriers to Discharge: Continued Medical Work up   Patient Goals and CMS Choice   Choice offered to / list presented to : Patient  Discharge Placement                         Discharge Plan and Services Additional resources added to the After Visit Summary for   In-house Referral: Clinical Social Work   Post Acute Care Choice: Residential Hospice Bed          DME Arranged: N/A DME Agency: NA                  Social Determinants of Health (SDOH) Interventions SDOH Screenings   Food Insecurity: No Food Insecurity (07/08/2022)  Housing: Low Risk  (07/08/2022)  Transportation Needs: No Transportation Needs (07/08/2022)  Utilities: Not At Risk (07/08/2022)  Depression (PHQ2-9): Medium Risk (11/25/2020)  Tobacco Use: Medium Risk (07/07/2022)     Readmission Risk Interventions     No data to display

## 2022-07-10 NOTE — Discharge Summary (Signed)
Physician Discharge Summary   Christopher Dawson NTZ:[id number] DOB: 22-Feb-1956 DOA: 07/07/2022  PCP: [name], MD  Admit date: 07/07/2022 Discharge date: 07/10/2022  Admitted From: Home Disposition:  [place] Discharging physician: Dwyane Dee, MD Barriers to discharge: none  Recommendations for Outpatient Follow-up:  Continue comfort care   Discharge Condition: fair CODE STATUS: DNR Diet recommendation:  Diet Orders (From admission, onward)     Start     Ordered   07/10/22 0000  Diet general        07/10/22 1208   07/07/22 2125  Diet regular Room service appropriate? Yes; Fluid consistency: Thin  Diet effective now       Question Answer Comment  Room service appropriate? Yes   Fluid consistency: Thin      07/07/22 2125            Hospital Course: Mr. 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 Other PMH Marfan syndrome, anxiety/depression, COPD. He presented to the hospital with generalized worsening pain, vomiting, weakness.  He stated he had stopped taking his [name] and has overall just been declining at home ([name]).  His pain medications have also not been adequately controlling pain anymore either.  He underwent testing with CT chest/abdomen/pelvis.  This showed new large right pleural effusion, new pulmonary nodules, progressive metastatic bone disease with enlarging lytic lesions involving L1/2, new hepatic disease, and new right adrenal gland disease.  Findings were discussed with patient and he was wishing for transitioning to hospice and foregoing any further treatments.  Palliative care and oncology were consulted on admission.  His status was changed to DNR and he was considered appropriate for transitioning to comfort care.  Patient was also amenable with this.  Assessment and Plan: * Failure to thrive in adult - Poor intake and ongoing nausea/vomiting with  uncontrolled pain.  Overall progressive functional decline and not thriving well anymore -Goals of care have been discussed and CT findings also reviewed with patient.  He is amenable for transitioning to hospice/comfort care at this time and discontinuing unnecessary medications -Oncology and palliative care have evaluated, appreciate assistance - Main focus now will be pain control and comfort -Continue pain control as per palliative care.  Pain control to continue at [facility] place  Non-small cell carcinoma of left lung, stage 4 (HCC) Current therapy: 1) Krazati p.o. daily.  stopped taking this 2 weeks ago and FTT Imaging shows progression of disease and has been discussed with him - patient also seen by palliative care and oncology on 3/16 - see FTT above as well - continue comfort care  Intractable back pain - Patient presented with uncontrolled and worsening back pain despite ongoing home medications - CT shows progressive metastatic bone lesions with enlarging lytic lesions notably in L1 and L2 -Palliative care helping manage pain control  Vomiting-resolved as of 07/09/2022 - Continue antiemetics and focusing on comfort  Pleural effusion on right New finding on CT and large in size, secondary to malignancy He is not short of breath or hypoxic - continue comfort  Asymptomatic bacteriuria-resolved as of 07/08/2022 - UA concerning for possible infection. Was started on Rocephin on admission. Given pursuit of comfort and patient asymptomatic, d/c rocephin for now and focus on comfort  GAD (generalized anxiety disorder) Stopped taking all medication x 2 weeks ago (buspar and hydroxyzine)    Principal Diagnosis: Failure to thrive in adult  Discharge Diagnoses: Active Hospital Problems  Diagnosis Date Noted   Failure to thrive in adult 07/07/2022    Priority: 1.   Non-small cell carcinoma of left lung, stage 4 ([place]) 06/16/2021    Priority: 2.   Intractable back pain 04/23/2021     Priority: 2.   Pleural effusion on right 07/07/2022    Priority: 4.   GAD (generalized anxiety disorder) 07/04/2012    Priority: 7.   High risk medication use 07/09/2022   Intractable vomiting 07/09/2022   DNR (do not resuscitate) 07/08/2022   Counseling and coordination of care 07/08/2022   Cancer associated pain 07/08/2022   End of life care 07/08/2022   Adult failure to thrive 07/08/2022    Resolved Hospital Problems   Diagnosis Date Noted Date Resolved   Vomiting 07/07/2022 07/09/2022    Priority: 3.   Asymptomatic bacteriuria 07/07/2022 07/08/2022    Priority: 4.   Depression 05/05/2021 07/07/2022   COPD (chronic obstructive pulmonary disease) ([place]) 05/05/2021 07/07/2022   Hyperlipidemia 06/24/2012 07/07/2022     Discharge Instructions     Diet general   Complete by: As directed    Increase activity slowly   Complete by: As directed       Allergies as of 07/10/2022       Reactions   Codeine Other (See Comments)   Made me feel "spaced out"        Medication List     STOP taking these medications    acetaminophen 325 MG tablet Commonly known as: TYLENOL   busPIRone 7.5 MG tablet Commonly known as: BUSPAR   ciclopirox 0.77 % Susp Commonly known as: LOPROX   Eucerin Original Healing Lotn   feeding supplement Liqd   fentaNYL 12 MCG/HR Commonly known as: DURAGESIC   ferrous sulfate 325 (65 FE) MG tablet   folic acid 1 MG tablet Commonly known as: FOLVITE   gabapentin 100 MG capsule Commonly known as: NEURONTIN   hydrocortisone cream 1 %   hydrOXYzine 50 MG tablet Commonly known as: ATARAX   Krazati 200 MG tablet Generic drug: adagrasib   lactulose 10 GM/15ML solution Commonly known as: CHRONULAC   Lactulose 20 GM/30ML Soln   mirtazapine 15 MG tablet Commonly known as: REMERON   multivitamin with minerals Tabs tablet   ondansetron 4 MG tablet Commonly known as: ZOFRAN   oxyCODONE 15 mg 12 hr tablet Commonly known as:  OXYCONTIN   oxyCODONE 5 MG immediate release tablet Commonly known as: Oxy IR/ROXICODONE   pantoprazole 40 MG tablet Commonly known as: PROTONIX   senna-docusate 8.6-50 MG tablet Commonly known as: Senokot-S   tamsulosin 0.4 MG Caps capsule Commonly known as: FLOMAX   thiamine 100 MG tablet Commonly known as: VITAMIN B1       TAKE these medications    dexamethasone 4 MG tablet Commonly known as: Decadron Take 1 tablet (4 mg total) by mouth daily.        Allergies  Allergen Reactions   Codeine Other (See Comments)    Made me feel "spaced out"    Consultations: Palliative care Oncology  Procedures:   Discharge Exam: BP 91/61 (BP Location: Left Arm)   Pulse 95   Temp 98.1 F (36.7 C) (Oral)   Resp (!) 1 Comment: comfort  Ht 6\' 4"  (1.93 m)   Wt 63.7 kg   SpO2 (!) 1% Comment: comfort  BMI 17.09 kg/m  Physical Exam Constitutional:      Comments: Chronically ill-appearing elderly gentleman lying in bed now appearing much more comfortable  HENT:     Head: Normocephalic and atraumatic.     Mouth/Throat:     Mouth: Mucous membranes are dry.  Eyes:     Extraocular Movements: Extraocular movements intact.  Cardiovascular:     Rate and Rhythm: Normal rate and regular rhythm.  Pulmonary:     Effort: Pulmonary effort is normal.     Breath sounds: Normal breath sounds.  Abdominal:     General: Bowel sounds are normal. There is no distension.     Palpations: Abdomen is soft.     Tenderness: There is no abdominal tenderness.  Musculoskeletal:        General: Normal range of motion.     Cervical back: Normal range of motion and neck supple.  Skin:    General: Skin is warm and dry.  Neurological:     General: No focal deficit present.     Mental Status: He is oriented to person, place, and time.  Psychiatric:        Mood and Affect: Mood normal.      The results of significant diagnostics from this hospitalization (including imaging, microbiology,  ancillary and laboratory) are listed below for reference.   Microbiology: Recent Results (from the past 240 hour(s))  Resp panel by RT-PCR (RSV, Flu A&B, Covid) Anterior Nasal Swab     Status: None   Collection Time: 07/07/22 12:53 PM   Specimen: Anterior Nasal Swab  Result Value Ref Range Status   SARS Coronavirus 2 by RT PCR NEGATIVE NEGATIVE Final    Comment: (NOTE) SARS-CoV-2 target nucleic acids are NOT DETECTED.  The SARS-CoV-2 RNA is generally detectable in upper respiratory specimens during the acute phase of infection. The lowest concentration of SARS-CoV-2 viral copies this assay can detect is 138 copies/mL. A negative result does not preclude SARS-Cov-2 infection and should not be used as the sole basis for treatment or other patient management decisions. A negative result may occur with  improper specimen collection/handling, submission of specimen other than nasopharyngeal swab, presence of viral mutation(s) within the areas targeted by this assay, and inadequate number of viral copies(<138 copies/mL). A negative result must be combined with clinical observations, patient history, and epidemiological information. The expected result is Negative.  Fact Sheet for Patients:  EntrepreneurPulse.com.au  Fact Sheet for Healthcare Providers:  IncredibleEmployment.be  This test is no t yet approved or cleared by the [place] FDA and  has been authorized for detection and/or diagnosis of SARS-CoV-2 by FDA under an Emergency Use Authorization (EUA). This EUA will remain  in effect (meaning this test can be used) for the duration of the COVID-19 declaration under Section 564(b)(1) of the Act, 21 U.S.C.section 360bbb-3(b)(1), unless the authorization is terminated  or revoked sooner.       Influenza A by PCR NEGATIVE NEGATIVE Final   Influenza B by PCR NEGATIVE NEGATIVE Final    Comment: (NOTE) The Xpert Xpress SARS-CoV-2/FLU/RSV plus  assay is intended as an aid in the diagnosis of influenza from Nasopharyngeal swab specimens and should not be used as a sole basis for treatment. Nasal washings and aspirates are unacceptable for Xpert Xpress SARS-CoV-2/FLU/RSV testing.  Fact Sheet for Patients: EntrepreneurPulse.com.au  Fact Sheet for Healthcare Providers: IncredibleEmployment.be  This test is not yet approved or cleared by the [place] FDA and has been authorized for detection and/or diagnosis of SARS-CoV-2 by FDA under an Emergency Use Authorization (EUA). This EUA will remain in effect (meaning this test can be used) for the duration of  the COVID-19 declaration under Section 564(b)(1) of the Act, 21 U.S.C. section 360bbb-3(b)(1), unless the authorization is terminated or revoked.     Resp Syncytial Virus by PCR NEGATIVE NEGATIVE Final    Comment: (NOTE) Fact Sheet for Patients: EntrepreneurPulse.com.au  Fact Sheet for Healthcare Providers: IncredibleEmployment.be  This test is not yet approved or cleared by the [place] FDA and has been authorized for detection and/or diagnosis of SARS-CoV-2 by FDA under an Emergency Use Authorization (EUA). This EUA will remain in effect (meaning this test can be used) for the duration of the COVID-19 declaration under Section 564(b)(1) of the Act, 21 U.S.C. section 360bbb-3(b)(1), unless the authorization is terminated or revoked.  Performed at [facility], [place] [address] [place], [place] [address]   Urine Culture (for pregnant, neutropenic or urologic patients or patients with an indwelling urinary catheter)     Status: Abnormal (Preliminary result)   Collection Time: 07/07/22  7:35 PM   Specimen: Urine, Clean Catch  Result Value Ref Range Status   Specimen Description   Final    URINE, CLEAN CATCH Performed at [facility], [place] [address], [place], [place] [address]    Special Requests   Final    NONE Performed at [facility], [place] [address] [place], [place] [address]    Culture (A)  Final    >=100,000 COLONIES/mL ESCHERICHIA COLI >=100,000 COLONIES/mL ENTEROCOCCUS FAECALIS SUSCEPTIBILITIES TO FOLLOW Performed at [facility], [place] [address] [place], [place] [address]    Report Status PENDING  Incomplete     Labs: BNP (last 3 results) Recent Labs    07/07/22 1330  BNP 99991111   Basic Metabolic Panel: Recent Labs  Lab 07/07/22 1252 07/07/22 2219 07/08/22 0609  NA 140  --  138  K 3.9  --  3.6  CL 107  --  104  CO2 23  --  27  GLUCOSE 98  --  104*  BUN 23  --  16  CREATININE 0.71  --  0.50*  CALCIUM 8.5*  --  8.2*  MG  --  2.0  --    Liver Function Tests: Recent Labs  Lab 07/07/22 1252  AST 20  ALT 12  ALKPHOS 82  BILITOT 0.9  PROT 6.5  ALBUMIN 3.2*   No results for input(s): "LIPASE", "AMYLASE" in the last 168 hours. No results for input(s): "AMMONIA" in the last 168 hours. CBC: Recent Labs  Lab 07/07/22 1252 07/08/22 0609  WBC 12.6* 8.7  NEUTROABS 10.2*  --   HGB 11.1* 8.9*  HCT 36.9* 29.7*  MCV 82.6 83.9  PLT 321 249   Cardiac Enzymes: No results for input(s): "CKTOTAL", "CKMB", "CKMBINDEX", "TROPONINI" in the last 168 hours. BNP: Invalid input(s): "POCBNP" CBG: No results for input(s): "GLUCAP" in the last 168 hours. D-Dimer No results for input(s): "DDIMER" in the last 72 hours. Hgb A1c No results for input(s): "HGBA1C" in the last 72 hours. Lipid Profile No results for input(s): "CHOL", "HDL", "LDLCALC", "TRIG", "CHOLHDL", "LDLDIRECT" in the last 72 hours. Thyroid function studies Recent Labs    07/07/22 2219  TSH 2.121   Anemia work up Recent Labs    07/07/22 2219  VITAMINB12 486   Urinalysis    Component Value Date/Time   COLORURINE AMBER (A) 07/07/2022 1834   APPEARANCEUR CLOUDY (A) 07/07/2022 1834   LABSPEC 1.025 07/07/2022 1834    PHURINE 5.0 07/07/2022 1834   GLUCOSEU NEGATIVE 07/07/2022 1834  HGBUR NEGATIVE 07/07/2022 1834   BILIRUBINUR MODERATE (A) 07/07/2022 1834   KETONESUR 20 (A) 07/07/2022 1834   PROTEINUR 100 (A) 07/07/2022 1834   NITRITE POSITIVE (A) 07/07/2022 1834   LEUKOCYTESUR MODERATE (A) 07/07/2022 1834   Sepsis Labs Recent Labs  Lab 07/07/22 1252 07/08/22 0609  WBC 12.6* 8.7   Microbiology Recent Results (from the past 240 hour(s))  Resp panel by RT-PCR (RSV, Flu A&B, Covid) Anterior Nasal Swab     Status: None   Collection Time: 07/07/22 12:53 PM   Specimen: Anterior Nasal Swab  Result Value Ref Range Status   SARS Coronavirus 2 by RT PCR NEGATIVE NEGATIVE Final    Comment: (NOTE) SARS-CoV-2 target nucleic acids are NOT DETECTED.  The SARS-CoV-2 RNA is generally detectable in upper respiratory specimens during the acute phase of infection. The lowest concentration of SARS-CoV-2 viral copies this assay can detect is 138 copies/mL. A negative result does not preclude SARS-Cov-2 infection and should not be used as the sole basis for treatment or other patient management decisions. A negative result may occur with  improper specimen collection/handling, submission of specimen other than nasopharyngeal swab, presence of viral mutation(s) within the areas targeted by this assay, and inadequate number of viral copies(<138 copies/mL). A negative result must be combined with clinical observations, patient history, and epidemiological information. The expected result is Negative.  Fact Sheet for Patients:  EntrepreneurPulse.com.au  Fact Sheet for Healthcare Providers:  IncredibleEmployment.be  This test is no t yet approved or cleared by the [place] FDA and  has been authorized for detection and/or diagnosis of SARS-CoV-2 by FDA under an Emergency Use Authorization (EUA). This EUA will remain  in effect (meaning this test can be used) for the  duration of the COVID-19 declaration under Section 564(b)(1) of the Act, 21 U.S.C.section 360bbb-3(b)(1), unless the authorization is terminated  or revoked sooner.       Influenza A by PCR NEGATIVE NEGATIVE Final   Influenza B by PCR NEGATIVE NEGATIVE Final    Comment: (NOTE) The Xpert Xpress SARS-CoV-2/FLU/RSV plus assay is intended as an aid in the diagnosis of influenza from Nasopharyngeal swab specimens and should not be used as a sole basis for treatment. Nasal washings and aspirates are unacceptable for Xpert Xpress SARS-CoV-2/FLU/RSV testing.  Fact Sheet for Patients: EntrepreneurPulse.com.au  Fact Sheet for Healthcare Providers: IncredibleEmployment.be  This test is not yet approved or cleared by the [place] FDA and has been authorized for detection and/or diagnosis of SARS-CoV-2 by FDA under an Emergency Use Authorization (EUA). This EUA will remain in effect (meaning this test can be used) for the duration of the COVID-19 declaration under Section 564(b)(1) of the Act, 21 U.S.C. section 360bbb-3(b)(1), unless the authorization is terminated or revoked.     Resp Syncytial Virus by PCR NEGATIVE NEGATIVE Final    Comment: (NOTE) Fact Sheet for Patients: EntrepreneurPulse.com.au  Fact Sheet for Healthcare Providers: IncredibleEmployment.be  This test is not yet approved or cleared by the [place] FDA and has been authorized for detection and/or diagnosis of SARS-CoV-2 by FDA under an Emergency Use Authorization (EUA). This EUA will remain in effect (meaning this test can be used) for the duration of the COVID-19 declaration under Section 564(b)(1) of the Act, 21 U.S.C. section 360bbb-3(b)(1), unless the authorization is terminated or revoked.  Performed at [facility], [place] [address] [place], [place] [address]   Urine Culture (for pregnant, neutropenic or  urologic patients or patients with an indwelling urinary  catheter)     Status: Abnormal (Preliminary result)   Collection Time: 07/07/22  7:35 PM   Specimen: Urine, Clean Catch  Result Value Ref Range Status   Specimen Description   Final    URINE, CLEAN CATCH Performed at [facility], [place] [address] [place], [place] [address]    Special Requests   Final    NONE Performed at [facility], [place] [address], [place], [place] [address]    Culture (A)  Final    >=100,000 COLONIES/mL ESCHERICHIA COLI >=100,000 COLONIES/mL ENTEROCOCCUS FAECALIS SUSCEPTIBILITIES TO FOLLOW Performed at [facility], [place] [address], [place], [place] [address]    Report Status PENDING  Incomplete    Procedures/Studies: DG Chest Portable 1 View  Result Date: 07/07/2022 CLINICAL DATA:  Shortness of breath, altered mental status EXAM: PORTABLE CHEST 1 VIEW COMPARISON:  Radiograph 06/29/2019, CT 07/05/2022 FINDINGS: There is a moderate size layering right pleural effusion with haziness of the right hemithorax. Known bilateral pulmonary nodules largest in the right peripheral and left peripheral lungs, best seen on prior chest CT. Unchanged cardiomediastinal silhouette. No pneumothorax. Bones are unchanged. IMPRESSION: Moderate size layering right pleural effusion with adjacent atelectasis. Known pulmonary nodules best evaluated on prior chest CT. Electronically Signed   By: Maurine Simmering M.D.   On: 07/07/2022 13:49   CT Abdomen Pelvis W Contrast  Result Date: 07/07/2022 CLINICAL DATA:  Metastatic non-small cell lung cancer. Assess treatment response. * Tracking Code: BO * EXAM: CT CHEST, ABDOMEN, AND PELVIS WITH CONTRAST TECHNIQUE: Multidetector CT imaging of the chest, abdomen and pelvis was performed following the standard protocol during bolus administration of intravenous contrast. RADIATION DOSE REDUCTION: This exam was performed according to the departmental  dose-optimization program which includes automated exposure control, adjustment of the mA and/or kV according to patient size and/or use of iterative reconstruction technique. CONTRAST:  132mL OMNIPAQUE IOHEXOL 300 MG/ML  SOLN COMPARISON:  Multiple previous imaging studies. The most recent CT scan is 02/15/2022. Prior PET-CT 06/15/2021 FINDINGS: CT CHEST FINDINGS Cardiovascular: The heart is normal in size. No pericardial effusion. The aorta is within normal limits in caliber. No dissection. The branch vessels are patent. Scattered coronary artery calcifications. Mediastinum/Nodes: Stable 9 mm right hilar lymph node. Scattered small mediastinal nodes are stable. The esophagus is grossly normal. The thyroid gland is unremarkable. Lungs/Pleura: New large right pleural effusion without obvious enhancing pleural nodules. There is a new right upper lobe pulmonary nodule measuring 18 mm on image 78/5. There is also a new subpleural left upper lobe pulmonary nodule measuring 15 mm on image 65/5. 5 mm pulmonary nodule in the left lower lobe on image number 100/5. Other smaller left lower lobe pulmonary nodules are noted. Findings consistent with metastatic disease. 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 Musculoskeletal: Progressive metastatic bone disease with enlarging lytic lesions most notably in the L1 and L2 vertebral bodies. CT ABDOMEN PELVIS FINDINGS Hepatobiliary: New hepatic metastatic disease. Adjacent 4.6 cm left hepatic lobe lesions anteriorly. Extensive portal adenopathy may be invading the liver also. The gallbladder is unremarkable. No common bile duct dilatation. Pancreas: Marked atrophy of the pancreas which is surrounded by bulky adenopathy. Spleen: Normal size.  No focal lesions. Adrenals/Urinary Tract: New 18 mm right thyroid nodule consistent with metastatic disease. The left adrenal gland is. Both kidneys are grossly normal. The bladder is normal.  Stomach/Bowel: The stomach, duodenum small bowel colon are grossly normal. Large amount of stool in the  rectum could suggest fecal impaction. Vascular/Lymphatic: The aorta and branch vessels are patent. The major venous structures are patent. The IVC is compressed and displaced by bulky adenopathy. Massive mesenteric, celiac axis, periportal and retroperitoneal partially necrotic appearing adenopathy. Some of the portal adenopathy may be directly invading the liver. The periportal/mesenteric nodal mass measures approximately 12.6 x 5.5 cm on image 59/3. Partially necrotic inter aortocaval nodal lesion measures 4.2 x 4.2 cm on image number 69/3. Lower right-sided retroperitoneal nodal mass measures 6.6 x 6.6 cm on image 77/3. The IVC is compressed and displaced but not occluded or thrombosed. The renal veins are still patent displaced. Reproductive: The prostate gland and seminal vesicles are grossly normal. Other: No ascites or abdominal wall hernia. Musculoskeletal: Progressive lytic metastatic bone disease lesions notably involving the L1 and L2 vertebral bodies. IMPRESSION: 1. New large right pleural effusion without obvious enhancing pleural nodules. 2. New pulmonary nodules consistent with metastatic disease. 3. Progressive metastatic bone disease with enlarging lytic lesions most notably in the L1 and L2 vertebral bodies. 4. New hepatic metastatic disease. 5. Massive mesenteric, celiac axis, periportal and retroperitoneal partially necrotic appearing adenopathy. Some of the portal adenopathy may be directly invading the liver. 6. New right adrenal gland nodule consistent with metastatic disease. Electronically Signed   By: Marijo Sanes M.D.   On: 07/07/2022 11:14   CT Chest W Contrast  Result Date: 07/07/2022 CLINICAL DATA:  Metastatic non-small cell lung cancer. Assess treatment response. * Tracking Code: BO * EXAM: CT CHEST, ABDOMEN, AND PELVIS WITH CONTRAST TECHNIQUE: Multidetector CT imaging of the  chest, abdomen and pelvis was performed following the standard protocol during bolus administration of intravenous contrast. RADIATION DOSE REDUCTION: This exam was performed according to the departmental dose-optimization program which includes automated exposure control, adjustment of the mA and/or kV according to patient size and/or use of iterative reconstruction technique. CONTRAST:  126mL OMNIPAQUE IOHEXOL 300 MG/ML  SOLN COMPARISON:  Multiple previous imaging studies. The most recent CT scan is 02/15/2022. Prior PET-CT 06/15/2021 FINDINGS: CT CHEST FINDINGS Cardiovascular: The heart is normal in size. No pericardial effusion. The aorta is within normal limits in caliber. No dissection. The branch vessels are patent. Scattered coronary artery calcifications. Mediastinum/Nodes: Stable 9 mm right hilar lymph node. Scattered small mediastinal nodes are stable. The esophagus is grossly normal. The thyroid gland is unremarkable. Lungs/Pleura: New large right pleural effusion without obvious enhancing pleural nodules. There is a new right upper lobe pulmonary nodule measuring 18 mm on image 78/5. There is also a new subpleural left upper lobe pulmonary nodule measuring 15 mm on image 65/5. 5 mm pulmonary nodule in the left lower lobe on image number 100/5. Other smaller left lower lobe pulmonary nodules are noted. Findings consistent with metastatic disease. 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 Musculoskeletal: Progressive metastatic bone disease with enlarging lytic lesions most notably in the L1 and L2 vertebral bodies. CT ABDOMEN PELVIS FINDINGS Hepatobiliary: New hepatic metastatic disease. Adjacent 4.6 cm left hepatic lobe lesions anteriorly. Extensive portal adenopathy may be invading the liver also. The gallbladder is unremarkable. No common bile duct dilatation. Pancreas: Marked atrophy of the pancreas which is surrounded by bulky adenopathy. Spleen: Normal size.   No focal lesions. Adrenals/Urinary Tract: New 18 mm right thyroid nodule consistent with metastatic disease. The left adrenal gland is. Both kidneys are grossly normal. The bladder is normal. Stomach/Bowel: The stomach, duodenum small bowel colon are grossly normal. Large amount of stool in the rectum  could suggest fecal impaction. Vascular/Lymphatic: The aorta and branch vessels are patent. The major venous structures are patent. The IVC is compressed and displaced by bulky adenopathy. Massive mesenteric, celiac axis, periportal and retroperitoneal partially necrotic appearing adenopathy. Some of the portal adenopathy may be directly invading the liver. The periportal/mesenteric nodal mass measures approximately 12.6 x 5.5 cm on image 59/3. Partially necrotic inter aortocaval nodal lesion measures 4.2 x 4.2 cm on image number 69/3. Lower right-sided retroperitoneal nodal mass measures 6.6 x 6.6 cm on image 77/3. The IVC is compressed and displaced but not occluded or thrombosed. The renal veins are still patent displaced. Reproductive: The prostate gland and seminal vesicles are grossly normal. Other: No ascites or abdominal wall hernia. Musculoskeletal: Progressive lytic metastatic bone disease lesions notably involving the L1 and L2 vertebral bodies. IMPRESSION: 1. New large right pleural effusion without obvious enhancing pleural nodules. 2. New pulmonary nodules consistent with metastatic disease. 3. Progressive metastatic bone disease with enlarging lytic lesions most notably in the L1 and L2 vertebral bodies. 4. New hepatic metastatic disease. 5. Massive mesenteric, celiac axis, periportal and retroperitoneal partially necrotic appearing adenopathy. Some of the portal adenopathy may be directly invading the liver. 6. New right adrenal gland nodule consistent with metastatic disease. Electronically Signed   By: Marijo Sanes M.D.   On: 07/07/2022 11:14     Time coordinating discharge: Over 3  minutes    Dwyane Dee, MD  [facility] 07/10/2022, 12:08 PM

## 2022-07-10 NOTE — Progress Notes (Signed)
  [facility] [id number] [facility]) Hospital Liaison Note     Hospice eligibility has been confirmed and [facility] is able to offer a room today. Consents have been completed by sister and we are awaiting the morphine infusion from our pharmacy, once received, we can arrange transport.  Please send completed and signed DNR with patient at discharge.  Bedside RN, please call report to [phone].  Please leave IV's in place.  Please call with any hospice related questions or concerns.  Thank you, Margaretmary Eddy, BSN, RN [facility] Liaison [phone]

## 2022-07-10 NOTE — TOC Progression Note (Signed)
Transition of Care [facility]) - Progression Note    Patient Details  Name: Markevis Menzel MRN: [id number] Date of Birth: 07-Feb-1956  Transition of Care [facility]) CM/SW [place], RN Phone Number:[phone]  07/10/2022, 12:20 PM  Clinical Narrative:    TOC following for plan to discharge to [facility]. Will arrange PTAR  when Dorothey Baseman is ready to receive patient. Currently awaiting morphine drip from pharmacy to [facility].    Expected Discharge Plan: [place] Barriers to Discharge: Continued Medical Work up  Expected Discharge Plan and Services In-house Referral: Clinical Social Work   Post Acute Care Choice: Residential Hospice Bed Living arrangements for the past 2 months: [place] (has been LTC resident at [facility]) Expected Discharge Date: 07/10/22               DME Arranged: N/A DME Agency: NA                   Social Determinants of Health ([place]) Interventions SDOH Screenings   Food Insecurity: No Food Insecurity (07/08/2022)  Housing: [place]  (07/08/2022)  Transportation Needs: No Transportation Needs (07/08/2022)  Utilities: Not At Risk (07/08/2022)  Depression (PHQ2-9): Medium Risk (11/25/2020)  Tobacco Use: Medium Risk (07/07/2022)    Readmission Risk Interventions     No data to display

## 2022-07-10 NOTE — Progress Notes (Signed)
Report called to Teresa, RN at [facility]. 

## 2022-07-10 NOTE — Progress Notes (Signed)
DIAGNOSIS: Stage IV (T1c, N1, M1 C) non-small cell lung cancer, adenosquamous carcinoma presented with left upper lobe lung nodule in addition to left hilar adenopathy and bone metastasis diagnosed January 2023.   Molecular studies by [facility] 1 showed positive KRAS G12C mutation   PD-L1 expression 10%   PRIOR THERAPY: 1) Palliative radiotherapy to the T12 destructive pathologic fracture under the care of Dr. Tammi Klippel completed on May 10, 2021. 2) Systemic chemotherapy with carboplatin for AUC of 5, Alimta 500 Mg/M2 and Keytruda 200 Mg IV every 3 weeks. Last dose on 08/08/21. Status post 3 cycles. Discontinued due to disease progression.    CURRENT THERAPY: Krazati p.o. daily.  First dose Sep 07, 2021.  Status post 10 months of treatment.  This treatment was discontinued secondary to disease progression.  Subjective: The patient is seen and examined today.  He is feeling a little bit better but has some confusion.  He was admitted to the hospital with failure to thrive as well as generalized pain and vomiting.  He has been on treatment with [name] (Adagrasib) for almost 10 months now but he has not been very compliant with his follow-up visit and imaging studies because of his condition and lack of appropriate transportation.  He had a recent CT scan of the chest, abdomen and pelvis performed on July 05, 2022 and unfortunately showed significant disease progression.   Objective: Vital signs in last 24 hours: Temp:  [98.1 F (36.7 C)] 98.1 F (36.7 C) (03/18 0604) Pulse Rate:  [95] 95 (03/18 0604) Resp:  [1-18] 1 (03/18 0807) BP: (91)/(61) 91/61 (03/18 0604) SpO2:  [1 %-95 %] 1 % (03/18 0807)  Intake/Output from previous day: 03/17 0701 - 03/18 0700 In: 501 [P.O.:475; I.V.:26] Out: 450 [Urine:450] Intake/Output this shift: No intake/output data recorded.  General appearance: alert, cooperative, fatigued, and no distress Resp: clear to auscultation bilaterally Cardio: regular  rate and rhythm, S1, S2 normal, no murmur, click, rub or gallop GI: soft, non-tender; bowel sounds normal; no masses,  no organomegaly Extremities: extremities normal, atraumatic, no cyanosis or edema  Lab Results:  Recent Labs    07/08/22 0609  WBC 8.7  HGB 8.9*  HCT 29.7*  PLT 249   BMET Recent Labs    07/08/22 0609  NA 138  K 3.6  CL 104  CO2 27  GLUCOSE 104*  BUN 16  CREATININE 0.50*  CALCIUM 8.2*    Studies/Results: No results found.  Medications: I have reviewed the patient's current medications.  CODE STATUS: No CODE BLUE  Assessment/Plan: This is a very pleasant 67 years old white male with stage IV non-small cell lung cancer diagnosed in January 2023 with positive KRAS G12C mutation and PD-L1 expression of 10%.  The patient had palliative radiotherapy to the T12 destructive pathologic fracture followed by systemic chemotherapy with carboplatin, Alimta and Keytruda status post 3 cycles discontinued secondary to disease progression. He started treatment with targeted therapy with Krazati (Adagrasib) 600 mg p.o. twice daily the for around 10 months but unfortunately has significant evidence for disease progression on the recent imaging studies. I had a lengthy discussion with the patient today about her condition.  I explained to the patient future treatment systemic chemotherapy are unlikely to be tolerated with his current condition. I strongly encouraged him to proceed with the palliative care and hospice as previously discussed with the palliative care team. He is awaiting discharge to [facility] for end-of-life care. Thank you so much for taking good care of  Christopher Dawson.  Please call if you have any questions.  LOS: 2 days    [name] 07/10/2022

## 2022-07-10 NOTE — Progress Notes (Signed)
Daily Progress Note   Patient Name: Christopher Dawson       Date: 07/10/2022 DOB: April 21, 1956  Age: 67 y.o. MRN#: [id number] Attending Physician: Dwyane Dee, MD Primary Care Physician: [name], MD Admit Date: 07/07/2022 Length of Stay: 2 days  Reason for Consultation/Follow-up: Establishing goals of care and Symptom Management  Subjective:   CC: Patient feels pain is tolerable at this time.  Following up regarding complex medical decision making and symptom management.  Subjective:  Extensive review of EMR prior to seeing patient.  At time of EMR review,  patient has continued to received 3 mg IV morphine basal dosing continuously.  Patient has not required any bolus dosing of morphine via PCA.  Started patient on IV dexamethasone on 07/09/2022.  Presented to bedside to check on patient today.  No family present at bedside.  Followed up regarding patient's symptom burden.  Patient still feels that he is having pain though it is tolerable at this time on the morphine PCA.  Discussed that patient has not pushed the PCA button for any boluses.  Patient feels that he needs them though he just has not wanted to push the button.  Discussed transitioning to nurse driven boluses though patient notes he wants to continue with PCA at this time so he can bolus himself if needed.  Noted that if this changes, would recommend nurse based morphine drip to assist with management. Patient awaiting further word from [facility] about acceptance and bed offering.  All questions answered at that time.  Thanked patient for allowing me to visit with him today.  Objective:   Vital Signs:  BP 91/61 (BP Location: Left Arm)   Pulse 95   Temp 98.1 F (36.7 C) (Oral)   Resp (!) 1 Comment: comfort  Ht 6\' 4"  (1.93 m)   Wt 63.7 kg   SpO2 (!) 1% Comment: comfort  BMI 17.09 kg/m   Physical Exam: General: NAD, awake, laying in bed, frail, chronically ill appearing Eyes: no drainage noted HENT: Dry  mucous membranes Cardiovascular: RRR, no edema in LE b/l Respiratory: no increased work of breathing noted, not in respiratory distress Abdomen: not distended Extremities: Muscle wasting present in all extremities Skin: no rashes or lesions on visible skin Neuro: A&Ox4, following commands easily Psych: appropriately answers all questions  Imaging:  I personally reviewed recent imaging.   Assessment & Plan:   Assessment:  67 y.o. 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 Admitted to [facility] on 07/07/2022 with pain, N/V, and FTT. He has a past medical history of depression, tobacco abuse, anxiety, Marfan syndrome, ETOH, and COPD.  Mr. Robtoy has been followed by Oncology and CT scan on 07/05/2022 confirmed the rapid progression of his cancer. Dr. Alvy Bimler met with patient on 3/15 and he elected for comfort care.  Palliative medicine team consulted to assist with complex medical decision making and symptom management.  Recommendations/Plan: # Complex medical decision making/goals of care:  - Patient had clearly stated that he wants to focus on comfort focused care at the end of his life in the setting of metastatic lung cancer. Patient awaiting acceptance and bed placement at [facility].  -  Code Status: DNR Prognosis: days-weeks  # Symptom management:  -Pain, acute (in the setting rapidly progressing lung cancer metastatic to liver and bone) -Continue IV Morphine PCA basal dose to 3 mg/hr and bolus to 4 mg q 12 min PRN breakthrough pain. Clinician bolus available at  3 mg q 30 min PRN pain, dyspnea, or RR >25.    - Continue IV dexamethasone 4mg  daily    - Anxiety -Continue IV Lorazepam 1 mg q 4 hr PRN anxiety.  - Nausea/vomiting - Continue IV dexamethasone as described above -Has prn Zofran available   -Constipation  To remain on bowel regimen while receiving opioids   -Continue senna tabs daily  # Discharge Planning: referral to hospice. Likely need  inpatient hospice based on symptom burden.   Discussed with: hospitalist, patient, bedside RN  Thank you for allowing the palliative care team to participate in the care [name].  Chelsea Aus, DO Palliative Care Provider PMT # [phone]  If patient remains symptomatic despite maximum doses, please call PMT at [phone] between 0700 and 1900. Outside of these hours, please call attending, as PMT does not have night coverage.  *Please note that this is a verbal dictation therefore any spelling or grammatical errors are due to the "[place] [employer]" system interpretation.

## 2022-07-11 ENCOUNTER — Other Ambulatory Visit (HOSPITAL_COMMUNITY): Payer: Self-pay

## 2022-07-11 LAB — URINE CULTURE: Culture: >=100000 — AB

## 2022-07-12 ENCOUNTER — Other Ambulatory Visit: Payer: Self-pay

## 2022-07-12 ENCOUNTER — Other Ambulatory Visit (HOSPITAL_COMMUNITY): Payer: Self-pay

## 2022-07-12 ENCOUNTER — Telehealth: Payer: Self-pay

## 2022-07-12 LAB — VITAMIN B1: Vitamin B1 (Thiamine): 186.6 nmol/L (ref 66.5–200.0)

## 2022-07-12 NOTE — Transitions of Care (Post Inpatient/ED Visit) (Signed)
   07/12/2022  Name: Aansh Nordmann MRN: [id number] DOB: 1955/11/12  Today's TOC FU Call Status: Today's TOC FU Call Status:: Unsuccessul Call (1st Attempt) Unsuccessful Call (1st Attempt) Date: 07/12/22  Attempted to reach the patient regarding the most recent Inpatient/ED visit.  Follow Up Plan: Additional outreach attempts will be made to reach the patient to complete the Transitions of Care (Post Inpatient/ED visit) call.   Signature  Angeline Slim, BSN, Therapist, sports

## 2022-07-13 ENCOUNTER — Other Ambulatory Visit: Payer: Self-pay

## 2022-07-13 NOTE — Transitions of Care (Post Inpatient/ED Visit) (Signed)
   07/13/2022  Name: Christopher Dawson MRN: [id number] DOB: [birth year]  Today's TOC FU Call Status: Today's TOC FU Call Status:: Unsuccessful Call (2nd Attempt) Unsuccessful Call (1st Attempt) Date: 07/12/22 Unsuccessful Call (2nd Attempt) Date: 07/13/22  Attempted to reach the patient regarding the most recent Inpatient/ED visit.  Follow Up Plan: Additional outreach attempts will be made to reach the patient to complete the Transitions of Care (Post Inpatient/ED visit) call.   Signature Angeline Slim, BSN, Therapist, sports

## 2022-07-17 ENCOUNTER — Telehealth: Payer: Self-pay | Admitting: Family Medicine

## 2022-07-19 NOTE — Transitions of Care (Post Inpatient/ED Visit) (Signed)
   07/19/2022  Name: Christopher Dawson MRN: [id number] DOB: 10-May-1955  Today's TOC FU Call Status: Today's TOC FU Call Status:: Unsuccessful Call (2nd Attempt) Unsuccessful Call (1st Attempt) Date: 07/12/22 Unsuccessful Call (2nd Attempt) Date: 07/13/22  Attempted to reach the patient regarding the most recent Inpatient/ED visit.  Follow Up Plan: No further outreach attempts will be made at this time. We have been unable to contact the patient. Pt deceased 06-Aug-2022 Signature Angeline Slim, BSN, RN

## 2022-07-24 NOTE — Telephone Encounter (Signed)
FYI attachment below

## 2022-07-24 DEATH — deceased

## 2023-03-15 ENCOUNTER — Other Ambulatory Visit: Payer: Self-pay

## 2023-03-15 NOTE — Telephone Encounter (Signed)
Telephone call  

## 2023-06-20 ENCOUNTER — Other Ambulatory Visit (HOSPITAL_COMMUNITY): Payer: Self-pay

## 2023-06-22 ENCOUNTER — Other Ambulatory Visit (HOSPITAL_BASED_OUTPATIENT_CLINIC_OR_DEPARTMENT_OTHER): Payer: Self-pay
